# Patient Record
Sex: Male | Born: 1940 | Race: White | Hispanic: No | Marital: Married | Smoking: Former smoker
Health system: Southern US, Community
[De-identification: ages and names within clinical notes are randomized; demographics above are authoritative.]

## PROBLEM LIST (undated history)

## (undated) DIAGNOSIS — I2510 Atherosclerotic heart disease of native coronary artery without angina pectoris: Secondary | ICD-10-CM

## (undated) DIAGNOSIS — E782 Mixed hyperlipidemia: Secondary | ICD-10-CM

## (undated) DIAGNOSIS — I509 Heart failure, unspecified: Secondary | ICD-10-CM

## (undated) DIAGNOSIS — C61 Malignant neoplasm of prostate: Secondary | ICD-10-CM

## (undated) DIAGNOSIS — Z8673 Personal history of transient ischemic attack (TIA), and cerebral infarction without residual deficits: Secondary | ICD-10-CM

## (undated) DIAGNOSIS — I495 Sick sinus syndrome: Secondary | ICD-10-CM

## (undated) DIAGNOSIS — D649 Anemia, unspecified: Secondary | ICD-10-CM

## (undated) DIAGNOSIS — C7951 Secondary malignant neoplasm of bone: Secondary | ICD-10-CM

## (undated) DIAGNOSIS — T82110A Breakdown (mechanical) of cardiac electrode, initial encounter: Secondary | ICD-10-CM

## (undated) DIAGNOSIS — I480 Paroxysmal atrial fibrillation: Secondary | ICD-10-CM

## (undated) HISTORY — DX: Atherosclerotic heart disease of native coronary artery without angina pectoris: I25.10

## (undated) HISTORY — DX: Secondary malignant neoplasm of bone: C61

## (undated) HISTORY — DX: Breakdown (mechanical) of cardiac electrode, initial encounter: T82.110A

## (undated) HISTORY — DX: Mixed hyperlipidemia: E78.2

## (undated) HISTORY — DX: Sick sinus syndrome: I49.5

## (undated) HISTORY — PX: PACEMAKER INSERTION: SHX728

## (undated) HISTORY — DX: Paroxysmal atrial fibrillation: I48.0

## (undated) HISTORY — DX: Malignant neoplasm of prostate: C79.51

## (undated) HISTORY — DX: Personal history of transient ischemic attack (TIA), and cerebral infarction without residual deficits: Z86.73

---

## 2001-12-07 ENCOUNTER — Ambulatory Visit: Admission: RE | Admit: 2001-12-07 | Discharge: 2002-03-07 | Payer: Self-pay | Admitting: Radiation Oncology

## 2004-12-09 ENCOUNTER — Ambulatory Visit: Payer: Self-pay | Admitting: Cardiology

## 2004-12-16 ENCOUNTER — Inpatient Hospital Stay (HOSPITAL_BASED_OUTPATIENT_CLINIC_OR_DEPARTMENT_OTHER): Admission: RE | Admit: 2004-12-16 | Discharge: 2004-12-16 | Payer: Self-pay | Admitting: Cardiology

## 2004-12-18 ENCOUNTER — Ambulatory Visit (HOSPITAL_COMMUNITY): Admission: RE | Admit: 2004-12-18 | Discharge: 2004-12-19 | Payer: Self-pay | Admitting: Cardiology

## 2004-12-19 ENCOUNTER — Ambulatory Visit: Payer: Self-pay | Admitting: Cardiology

## 2005-01-05 ENCOUNTER — Ambulatory Visit: Payer: Self-pay | Admitting: Cardiology

## 2005-02-22 ENCOUNTER — Ambulatory Visit: Payer: Self-pay | Admitting: Cardiology

## 2005-03-03 ENCOUNTER — Ambulatory Visit: Payer: Self-pay | Admitting: Cardiology

## 2005-05-12 ENCOUNTER — Ambulatory Visit: Payer: Self-pay | Admitting: Internal Medicine

## 2005-06-11 ENCOUNTER — Ambulatory Visit: Payer: Self-pay | Admitting: Internal Medicine

## 2005-07-15 ENCOUNTER — Ambulatory Visit: Payer: Self-pay | Admitting: Internal Medicine

## 2005-08-13 ENCOUNTER — Ambulatory Visit: Payer: Self-pay | Admitting: Internal Medicine

## 2005-09-15 ENCOUNTER — Ambulatory Visit: Payer: Self-pay | Admitting: Internal Medicine

## 2005-09-29 ENCOUNTER — Ambulatory Visit: Payer: Self-pay | Admitting: Cardiology

## 2005-10-06 ENCOUNTER — Ambulatory Visit: Payer: Self-pay | Admitting: Cardiology

## 2005-10-07 ENCOUNTER — Ambulatory Visit: Payer: Self-pay | Admitting: Cardiology

## 2005-11-01 ENCOUNTER — Ambulatory Visit: Payer: Self-pay | Admitting: Internal Medicine

## 2005-12-03 ENCOUNTER — Ambulatory Visit: Payer: Self-pay | Admitting: Cardiology

## 2005-12-06 ENCOUNTER — Ambulatory Visit: Payer: Self-pay | Admitting: Internal Medicine

## 2006-01-03 ENCOUNTER — Ambulatory Visit: Payer: Self-pay | Admitting: Internal Medicine

## 2006-02-07 ENCOUNTER — Ambulatory Visit: Payer: Self-pay | Admitting: Internal Medicine

## 2006-02-08 ENCOUNTER — Encounter: Payer: Self-pay | Admitting: Cardiology

## 2006-03-09 ENCOUNTER — Ambulatory Visit: Payer: Self-pay | Admitting: Internal Medicine

## 2006-04-28 ENCOUNTER — Ambulatory Visit: Payer: Self-pay | Admitting: Cardiology

## 2006-05-25 ENCOUNTER — Ambulatory Visit: Payer: Self-pay | Admitting: Internal Medicine

## 2006-06-01 ENCOUNTER — Ambulatory Visit: Payer: Self-pay | Admitting: Cardiology

## 2006-07-20 ENCOUNTER — Ambulatory Visit: Payer: Self-pay | Admitting: Internal Medicine

## 2006-08-17 ENCOUNTER — Ambulatory Visit: Payer: Self-pay | Admitting: Internal Medicine

## 2006-09-14 ENCOUNTER — Ambulatory Visit: Payer: Self-pay | Admitting: Internal Medicine

## 2006-09-30 ENCOUNTER — Ambulatory Visit: Payer: Self-pay | Admitting: Cardiology

## 2006-09-30 ENCOUNTER — Ambulatory Visit: Payer: Self-pay | Admitting: Internal Medicine

## 2006-10-12 ENCOUNTER — Ambulatory Visit: Payer: Self-pay | Admitting: Internal Medicine

## 2006-11-09 ENCOUNTER — Ambulatory Visit: Payer: Self-pay | Admitting: Internal Medicine

## 2006-12-07 ENCOUNTER — Ambulatory Visit: Payer: Self-pay | Admitting: Internal Medicine

## 2007-01-02 ENCOUNTER — Ambulatory Visit: Payer: Self-pay | Admitting: Cardiology

## 2007-01-04 ENCOUNTER — Ambulatory Visit: Payer: Self-pay | Admitting: Internal Medicine

## 2007-02-01 ENCOUNTER — Ambulatory Visit: Payer: Self-pay | Admitting: Internal Medicine

## 2007-02-28 ENCOUNTER — Ambulatory Visit: Payer: Self-pay | Admitting: Internal Medicine

## 2007-03-29 ENCOUNTER — Ambulatory Visit: Payer: Self-pay | Admitting: Internal Medicine

## 2007-05-05 ENCOUNTER — Encounter: Payer: Self-pay | Admitting: Physician Assistant

## 2007-05-05 ENCOUNTER — Ambulatory Visit: Payer: Self-pay | Admitting: Internal Medicine

## 2007-05-10 ENCOUNTER — Ambulatory Visit (HOSPITAL_COMMUNITY): Admission: RE | Admit: 2007-05-10 | Discharge: 2007-05-10 | Payer: Self-pay | Admitting: Internal Medicine

## 2007-05-10 ENCOUNTER — Ambulatory Visit: Payer: Self-pay | Admitting: Internal Medicine

## 2007-05-25 ENCOUNTER — Ambulatory Visit: Payer: Self-pay

## 2007-09-01 ENCOUNTER — Ambulatory Visit: Payer: Self-pay | Admitting: Internal Medicine

## 2008-02-12 ENCOUNTER — Encounter: Payer: Self-pay | Admitting: Cardiology

## 2008-02-15 ENCOUNTER — Ambulatory Visit: Payer: Self-pay | Admitting: Cardiology

## 2008-05-21 ENCOUNTER — Ambulatory Visit: Payer: Self-pay | Admitting: Internal Medicine

## 2008-09-04 ENCOUNTER — Ambulatory Visit: Payer: Self-pay | Admitting: Internal Medicine

## 2008-10-04 DIAGNOSIS — I495 Sick sinus syndrome: Secondary | ICD-10-CM | POA: Insufficient documentation

## 2008-10-04 DIAGNOSIS — E782 Mixed hyperlipidemia: Secondary | ICD-10-CM

## 2008-10-04 DIAGNOSIS — G459 Transient cerebral ischemic attack, unspecified: Secondary | ICD-10-CM | POA: Insufficient documentation

## 2008-10-04 DIAGNOSIS — I2510 Atherosclerotic heart disease of native coronary artery without angina pectoris: Secondary | ICD-10-CM | POA: Insufficient documentation

## 2008-12-04 ENCOUNTER — Ambulatory Visit: Payer: Self-pay | Admitting: Internal Medicine

## 2008-12-24 ENCOUNTER — Encounter: Payer: Self-pay | Admitting: Internal Medicine

## 2009-02-06 ENCOUNTER — Encounter (INDEPENDENT_AMBULATORY_CARE_PROVIDER_SITE_OTHER): Payer: Self-pay | Admitting: *Deleted

## 2009-03-13 ENCOUNTER — Encounter: Payer: Self-pay | Admitting: Internal Medicine

## 2009-03-17 ENCOUNTER — Ambulatory Visit: Payer: Self-pay | Admitting: Internal Medicine

## 2009-03-27 ENCOUNTER — Encounter: Payer: Self-pay | Admitting: Cardiology

## 2009-04-03 ENCOUNTER — Encounter: Payer: Self-pay | Admitting: Internal Medicine

## 2009-05-01 ENCOUNTER — Ambulatory Visit: Payer: Self-pay | Admitting: Cardiology

## 2009-05-01 DIAGNOSIS — Z950 Presence of cardiac pacemaker: Secondary | ICD-10-CM | POA: Insufficient documentation

## 2009-05-23 ENCOUNTER — Ambulatory Visit: Payer: Self-pay | Admitting: Internal Medicine

## 2009-08-26 ENCOUNTER — Ambulatory Visit: Payer: Self-pay | Admitting: Internal Medicine

## 2009-08-28 ENCOUNTER — Encounter: Payer: Self-pay | Admitting: Internal Medicine

## 2009-11-28 ENCOUNTER — Encounter: Payer: Self-pay | Admitting: Internal Medicine

## 2009-12-04 ENCOUNTER — Ambulatory Visit: Payer: Self-pay | Admitting: Internal Medicine

## 2009-12-04 ENCOUNTER — Other Ambulatory Visit: Payer: Self-pay | Admitting: Internal Medicine

## 2009-12-18 ENCOUNTER — Encounter: Payer: Self-pay | Admitting: Internal Medicine

## 2010-03-06 ENCOUNTER — Encounter (INDEPENDENT_AMBULATORY_CARE_PROVIDER_SITE_OTHER): Payer: Self-pay | Admitting: *Deleted

## 2010-04-14 ENCOUNTER — Encounter: Payer: Self-pay | Admitting: Physician Assistant

## 2010-04-14 ENCOUNTER — Encounter: Payer: Self-pay | Admitting: Cardiology

## 2010-04-15 ENCOUNTER — Ambulatory Visit: Payer: Self-pay | Admitting: Cardiovascular Disease

## 2010-04-15 ENCOUNTER — Inpatient Hospital Stay (HOSPITAL_COMMUNITY): Admission: AD | Admit: 2010-04-15 | Discharge: 2010-04-16 | Payer: Self-pay | Admitting: Cardiology

## 2010-04-15 ENCOUNTER — Ambulatory Visit: Payer: Self-pay | Admitting: Cardiology

## 2010-04-16 ENCOUNTER — Encounter: Payer: Self-pay | Admitting: Cardiology

## 2010-05-06 ENCOUNTER — Ambulatory Visit: Payer: Self-pay | Admitting: Cardiology

## 2010-05-06 DIAGNOSIS — I2510 Atherosclerotic heart disease of native coronary artery without angina pectoris: Secondary | ICD-10-CM

## 2010-05-06 DIAGNOSIS — I951 Orthostatic hypotension: Secondary | ICD-10-CM

## 2010-06-22 ENCOUNTER — Encounter: Payer: Self-pay | Admitting: Internal Medicine

## 2010-06-22 ENCOUNTER — Ambulatory Visit
Admission: RE | Admit: 2010-06-22 | Discharge: 2010-06-22 | Payer: Self-pay | Source: Home / Self Care | Attending: Internal Medicine | Admitting: Internal Medicine

## 2010-06-25 ENCOUNTER — Encounter: Payer: Self-pay | Admitting: Cardiology

## 2010-06-29 ENCOUNTER — Ambulatory Visit
Admission: RE | Admit: 2010-06-29 | Discharge: 2010-06-29 | Payer: Self-pay | Source: Home / Self Care | Attending: Cardiology | Admitting: Cardiology

## 2010-06-29 ENCOUNTER — Encounter: Payer: Self-pay | Admitting: Internal Medicine

## 2010-07-01 ENCOUNTER — Encounter: Payer: Self-pay | Admitting: Cardiology

## 2010-07-14 ENCOUNTER — Telehealth (INDEPENDENT_AMBULATORY_CARE_PROVIDER_SITE_OTHER): Payer: Self-pay | Admitting: *Deleted

## 2010-07-14 NOTE — Assessment & Plan Note (Signed)
Summary: 1 YR FU PER NOV REMINDER   Visit Type:  Follow-up Primary Provider:  Dr. Donzetta Sprung   History of Present Illness: the patient is a  70 year-old male who underwent a recent cardiac catheterization. He received a drug-eluting stent in the midright coronary artery. The patient continues to have single-vessel coronary artery disease and preserved LV function. His procedure was performed on April 15, 2010  the patient also has a history of type pacemaker implantation, obstructivesleep apnea prostate cancer status post radiation came therapy and history of TIAs. Prior cardiac history includes Taxus stent x3 to the right or guarding 2006. Pacemaker was for sick sinus syndrome  the patient denies any recurrent chest pain. He has no shortness of breath. He has been doing well. His blood pressure however is relatively low. He noticed that this occurred after he starts on Flomax. He does have dizziness standing up.  Preventive Screening-Counseling & Management  Alcohol-Tobacco     Smoking Status: quit  Comments: quit smoking age 5  Current Medications (verified): 1)  Plavix 75 Mg Tabs (Clopidogrel Bisulfate) .... Take One Tablet By Mouth Daily 2)  Simvastatin 40 Mg Tabs (Simvastatin) .... Take One Tablet By Mouth Daily At Bedtime 3)  Zoloft 100 Mg Tabs (Sertraline Hcl) .... Take 1 Tablet By Mouth Once A Day 4)  Aspirin Ec 325 Mg Tbec (Aspirin) .... Take One Tablet By Mouth Daily 5)  Vitamin B-12 1000 Mcg Tabs (Cyanocobalamin) .... Take 1 Tablet By Mouth Once A Day 6)  Atenolol 50 Mg Tabs (Atenolol) .... Take 1/2 Tablet By Mouth Daily 7)  Klonopin 1 Mg Tabs (Clonazepam) .... Take 1 1/2 Tablet By Mouth Once A Day At [facility] 8)  Protonix 40 Mg Tbec (Pantoprazole Sodium) .... Take 1 Capsule By Mouth Once A Day 9)  Nitrostat 0.4 Mg Subl (Nitroglycerin) .Marland Kitchen.. 1 Tablet Under Tongue At Onset of Chest Pain; You May Repeat Every 5 Minutes For Up To 3 Doses.  Allergies: 1)  ! Pyridium 2)  !  Pcn  Comments:  Nurse/Medical Assistant: [name] medications were reviewed with the patient and were updated in the Medication List. Pt brought a list of medications to office visit.  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 Sinus node dysfunction status post pacemaker implantation and     generator replacement. 2. History of severe single-vessel coronary artery disease status post     Taxus at right coronary artery in 2006 and preserved left     ventricular function. 3. Transient ischemic attack recently. 4. Dyslipidemia.  drug-eluting stent to the right coronary artery April 15, 2010] preserved LV function  Review of Systems       The patient complains of dizziness.  The patient denies fatigue, malaise, fever, weight gain/loss, vision loss, decreased hearing, hoarseness, chest pain, palpitations, shortness of breath, prolonged cough, wheezing, sleep apnea, coughing up blood, abdominal pain, blood in stool, nausea, vomiting, diarrhea, heartburn, incontinence, blood in urine, muscle weakness, joint pain, leg swelling, rash, skin lesions, headache, fainting, depression, anxiety, enlarged lymph nodes, easy bruising or bleeding, and environmental allergies.    Vital Signs:  Patient profile:   70 year old male Height:      70 inches Weight:      146.50 pounds BMI:     21.10 Pulse rate:   68 / minute Pulse (ortho):  63 / minute BP sitting:   83 / 62  (left arm) BP standing:   88 / 53 Cuff size:   regular  Vitals Entered By: Cyril Loosen, RN, BSN (May 06, 2010 12:57 PM)  Serial Vital Signs/Assessments:  Time      Position  BP       Pulse  Resp  Temp     By 1:49 PM   Lying RA  98/64    [address], LPN 8:41 PM   Sitting   78/42    [address], LPN 3:24 PM   Standing  88/53    63                    Romney, LPN  Comments: 4:01 PM after 3 min:  81/60  61 after 5 min:  92/58  61 By: Hoover Brunette, LPN   Comments Per pt had MI and stent placed since last office visit.    Physical Exam  Additional Exam:  General: Well-developed, well-nourished in no distress head: Normocephalic and atraumatic eyes PERRLA/EOMI intact, conjunctiva and lids normal nose: No deformity or lesions mouth normal dentition, normal posterior pharynx neck: Supple, no JVD.  No masses, thyromegaly or abnormal cervical nodes lungs: Normal breath sounds bilaterally without wheezing.  Normal percussion heart: regular rate and rhythm with normal S1 and S2, no S3 or S4.  PMI is normal.  No pathological murmurs abdomen: Normal bowel sounds, abdomen is soft and nontender without masses, organomegaly or hernias noted.  No hepatosplenomegaly musculoskeletal: Back normal, normal gait muscle strength and tone normal pulsus: Pulse is normal in all 4 extremities Extremities: No peripheral pitting edema neurologic: Alert and oriented x 3 skin: Intact without lesions or rashes cervical nodes: No significant adenopathy psychologic: Normal affect    PPM Specifications Following MD:  Hillis Range, MD     PPM Vendor:  Medtronic     PPM Model Number:  ADDRL1     PPM Serial Number:  [id number] PPM DOI:  05/10/2007     PPM Implanting MD:  Sherryl Manges, MD  Lead 1    Location: RA     DOI: 09/19/1997     Model #: [id number]     Serial #: [id number]     Status: active Lead 2    Location: RV     DOI: 09/19/1997     Model #: [id number]     Serial #: [id number]     Status: active   Indications:  SINUS NODE DYSFUNCTION   PPM Follow Up Pacer Dependent:  No      Parameters Mode:  MVP (R)     Lower Rate Limit:  60     Upper Rate Limit:  150 Paced AV Delay:  150     Sensed AV Delay:  120  Impression & Recommendations:  Problem # 1:  CORONARY ARTERY DISEASE, S/P PTCA  (ICD-414.9) the patient underwent stent placement of the right coronary artery. He had no complications. He remains on lifelong aspirin and Plavix therapy. He has not total of 4 stents in his right coronary artery. His updated medication list for this problem includes:    Plavix 75 Mg Tabs (Clopidogrel bisulfate) .Marland Kitchen... Take one tablet by mouth daily  Aspirin Ec 325 Mg Tbec (Aspirin) .Marland Kitchen... Take one tablet by mouth daily    Atenolol 50 Mg Tabs (Atenolol) .Marland Kitchen... Take 1/2 tablet by mouth daily    Nitrostat 0.4 Mg Subl (Nitroglycerin) .Marland Kitchen... 1 tablet under tongue at onset of chest pain; you may repeat every 5 minutes for up to 3 doses.  Problem # 2:  ORTHOSTATIC HYPOTENSION (ICD-458.0) the patient's blood pressure is low in the office today. He also has orthostatic symptoms. We will check his blood pressure lying and standing. I asked him to stop Flomax. He remains orthostatic he could benefit from a small dose of Midodrin  Problem # 3:  CARDIAC PACEMAKER IN SITU (ICD-V45.01) no complications related to pacemaker.  Patient Instructions: 1)  Stop Flomax 2)  Follow up in  6 months

## 2010-07-14 NOTE — Letter (Signed)
Summary: Remote Device Check  [employer], Main Office  [address]   [place], [place] [address]   Phone: [phone]  Fax: [phone]     December 18, 2009 MRN: [id number]   Russell Browning [address] [place], [place]  [address]   Dear [name],   Your remote transmission was recieved and reviewed by your physician.  All diagnostics were within normal limits for you.  __X___Your next transmission is scheduled for:  03-05-2010.  Please transmit at any time this day.  If you have a wireless device your transmission will be sent automatically.    Sincerely,  Vella Kohler

## 2010-07-14 NOTE — Cardiovascular Report (Signed)
Summary: Office Visit Remote   Office Visit Remote   Imported By: Roderic Ovens 12/23/2009 13:28:51  _____________________________________________________________________  External Attachment:    Type:   Image     Comment:   External Document

## 2010-07-14 NOTE — Cardiovascular Report (Signed)
Summary: Office Visit Remote  Office Visit Remote   Imported By: Roderic Ovens 09/10/2009 15:52:47  _____________________________________________________________________  External Attachment:    Type:   Image     Comment:   External Document

## 2010-07-14 NOTE — Letter (Signed)
Summary: Device-Delinquent Phone [employer], newspaper, Main Office  [address]   [place], [place] [address]   Phone: [phone]  Fax: [phone]     March 06, 2010 MRN: [id number]   TREMOND SHIMABUKURO [address] [place], [place]  [address]   Dear Mr. Herd,  According to our records, you were scheduled for a device phone transmission on                              .    03/05/10. We did not receive any results from this check.  If you transmitted on your scheduled day, please call us to help troubleshoot your system.  If you forgot to send your transmission, please send one upon receipt of this letter.  Thank you,  Altha Harm, LPN  March 06, 2010 3:24 PM  [facility]

## 2010-07-14 NOTE — Letter (Signed)
Summary: Device-Delinquent Phone Journalist, newspaper, Main Office  [address]   [place], [place] [address]   Phone: [phone]  Fax: [phone]     November 28, 2009 MRN: [id number]   Russell Browning [address] [place], [place]  [address]   Dear Mr. Klingbeil,  According to our records, you were scheduled for a device phone transmission on 11-26-2009 .     We did not receive any results from this check.  If you transmitted on your scheduled day, please call us to help troubleshoot your system.  If you forgot to send your transmission, please send one upon receipt of this letter.  Thank you,   Architectural technologist [facility]

## 2010-07-14 NOTE — Consult Note (Signed)
Summary: CARDIOLOGY CONSULT/ [facility]  CARDIOLOGY CONSULT/ [facility]   Imported By: Zachary George 05/06/2010 13:30:43  _____________________________________________________________________  External Attachment:    Type:   Image     Comment:   External Document

## 2010-07-14 NOTE — Letter (Signed)
Summary: Remote Device Check  [employer], Main Office  [address]   [place], [place] [address]   Phone: [phone]  Fax: [phone]     August 28, 2009 MRN: [id number]   Russell Browning [address] [place], [place]  [address]   Dear [name],   Your remote transmission was recieved and reviewed by your physician.  All diagnostics were within normal limits for you.  __X___Your next transmission is scheduled for:    November 26, 2009.  Please transmit at any time this day.  If you have a wireless device your transmission will be sent automatically.     Sincerely,  Proofreader

## 2010-07-14 NOTE — Consult Note (Signed)
Summary: CARDIOLOGY CONSULT/ [facility]  CARDIOLOGY CONSULT/ [facility]   Imported By: Zachary George 05/06/2010 13:35:44  _____________________________________________________________________  External Attachment:    Type:   Image     Comment:   External Document

## 2010-07-16 NOTE — Assessment & Plan Note (Signed)
Summary: 1 YR FU PER DECEMBER REMINDER   Visit Type:  Pacemaker check Primary Provider:  Dr. Donzetta Sprung   History of Present Illness: The patient presents today for routine electrophysiology followup. He reports doing very well since last being seen in our clinic. The patient denies symptoms of palpitations, chest pain, shortness of breath, orthopnea, PND, lower extremity edema, dizziness, presyncope, syncope, or neurologic sequela. The patient is tolerating medications without difficulties and is otherwise without complaint today.   Preventive Screening-Counseling & Management  Alcohol-Tobacco     Smoking Status: quit     Year Quit: 1965  Current Medications (verified): 1)  Plavix 75 Mg Tabs (Clopidogrel Bisulfate) .... Take One Tablet By Mouth Daily 2)  Simvastatin 40 Mg Tabs (Simvastatin) .... Take One Tablet By Mouth Daily At Bedtime 3)  Zoloft 100 Mg Tabs (Sertraline Hcl) .... Take 1 Tablet By Mouth Once A Day 4)  Aspirin Ec 325 Mg Tbec (Aspirin) .... Take One Tablet By Mouth Daily 5)  Vitamin B-12 1000 Mcg Tabs (Cyanocobalamin) .... Take 1 Tablet By Mouth Once A Day 6)  Atenolol 50 Mg Tabs (Atenolol) .... Take 1/2 Tablet By Mouth Daily 7)  Klonopin 1 Mg Tabs (Clonazepam) .... Take 1 1/2 Tablet By Mouth Once A Day At [facility] 8)  Protonix 40 Mg Tbec (Pantoprazole Sodium) .... Take 1 Capsule By Mouth Once A Day 9)  Nitrostat 0.4 Mg Subl (Nitroglycerin) .Marland Kitchen.. 1 Tablet Under Tongue At Onset of Chest Pain; You May Repeat Every 5 Minutes For Up To 3 Doses. 10)  Pro-Flora Concentrate  Caps (Probiotic Product) .... Take 1 Tablet By Mouth Once A Day  Allergies (verified): 1)  ! Pyridium 2)  ! Pcn  Comments:  Nurse/Medical Assistant: [name] medications and allergies were reviewed with the patient and were updated in the Medication and Allergy Lists. Reviewed list w/ pt. Tammi Romine CMA (June 22, 2010 2:58 PM)  Past History:  Past Medical History: Reviewed history from  05/06/2010 and no changes required. 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 Sinus node dysfunction status post pacemaker implantation and     generator replacement. 2. History of severe single-vessel coronary artery disease status post     Taxus at right coronary artery in 2006 and preserved left     ventricular function. 3. Transient ischemic attack recently. 4. Dyslipidemia.  drug-eluting stent to the right coronary artery April 15, 2010] preserved LV function  Past Surgical History: Reviewed history from 10/04/2008 and no changes required. Pacmaker impant/Guidant Discovery dual-chamber pacemaker model 1274/April of 1998 at [facility] Taxus stenting right coronary artery, (x3), July 7,2006/Thomas D. Riley Kill, M.D  Insertion of a new pacemaker/05/10/2007/Steven C. Klein, MD/Medtronic ADAPTA L dual-chamber device  Social History: Reviewed history from 05/01/2009 and no changes required. the patient quit tobacco in 1967.  Vital Signs:  Patient profile:   70 year old male Height:      70 inches Weight:      144 pounds Pulse rate:   66 / minute BP sitting:   100 / 64  (right arm) Cuff size:   regular  Vitals Entered By: Fuller Plan CMA (June 22, 2010 2:58 PM)  Physical Exam  General:  Well developed, well nourished, in no acute distress. Head:  normocephalic and atraumatic Eyes:  PERRLA/EOM intact; conjunctiva and lids normal. Mouth:  Teeth, gums and palate normal. Oral mucosa normal. Neck:  supple Chest Wall:  pacemaker site is well  healed Lungs:  Clear bilaterally to auscultation and percussion. Heart:  Non-displaced PMI, chest non-tender; regular rate and rhythm, S1, S2 without murmurs, rubs or gallops. Carotid upstroke normal, no bruit. Normal abdominal aortic size, no bruits. Femorals normal pulses, no bruits. Pedals normal pulses. No edema, no varicosities. Abdomen:   Bowel sounds positive; abdomen soft and non-tender without masses, organomegaly, or hernias noted. No hepatosplenomegaly. Msk:  Back normal, normal gait. Muscle strength and tone normal. Extremities:  No clubbing or cyanosis. Neurologic:  Alert and oriented x 3.   PPM Specifications Following MD:  Hillis Range, MD     PPM Vendor:  [employer]     PPM Model Number:  ADDRL1     PPM Serial Number:  [id number] PPM DOI:  05/10/2007     PPM Implanting MD:  Sherryl Manges, MD  Lead 1    Location: RA     DOI: 09/19/1997     Model #: 4524     Serial #: [id number]     Status: active Lead 2    Location: RV     DOI: 09/19/1997     Model #: 5784     Serial #: [id number]     Status: active   Indications:  SINUS NODE DYSFUNCTION   PPM Follow Up Battery Voltage:  2.79 V     Battery Est. Longevity:  10 YRS     Pacer Dependent:  No       PPM Device Measurements Atrium  Amplitude: 2.80 mV, Impedance: 322 ohms, Threshold: 0.50 V at 0.40 msec Right Ventricle  Amplitude: 22.40 mV, Impedance: 1222 ohms, Threshold: 0.750 V at 0.40 msec  Episodes MS Episodes:  0     Ventricular High Rate:  0     Atrial Pacing:  98.3%     Ventricular Pacing:  0.2%  Parameters Mode:  MVP (R)     Lower Rate Limit:  60     Upper Rate Limit:  150 Paced AV Delay:  150     Sensed AV Delay:  120 Next Remote Date:  09/24/2010     Next Cardiology Appt Due:  06/15/2011 Tech Comments:  NORMAL DEVICE FUNCTION.  NO EPISODES OR MODE SWITCHES SINCE LAST CHECK.  CHANGED ACTIVITY THRESHOLD FROM MEDIUM/LOW TO LOW.  CHANGED RA OUTPUT FROM 1.5 TO 2.0 AND RV OUTPUT FROM 2.0 TO 2.5 V. PT COMPLAINING SOB W/ACTIVITY.  PT HAD STENT PLACED IN NOVEMBER.  CARELINK 09-24-10 AND ROV IN 12 MTHS W/JA. 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

## 2010-07-16 NOTE — Cardiovascular Report (Signed)
Summary: Card Device Clinic/ FINAL REPORT  Card Device Clinic/ FINAL REPORT   Imported By: Dorise Hiss 06/24/2010 16:27:49  _____________________________________________________________________  External Attachment:    Type:   Image     Comment:   External Document

## 2010-07-16 NOTE — Letter (Signed)
Summary: External Correspondence/ OFFICE NOTE [facility]  External Correspondence/ OFFICE NOTE [facility]   Imported By: Dorise Hiss 07/07/2010 12:40:28  _____________________________________________________________________  External Attachment:    Type:   Image     Comment:   External Document

## 2010-07-22 ENCOUNTER — Ambulatory Visit (INDEPENDENT_AMBULATORY_CARE_PROVIDER_SITE_OTHER): Payer: Medicare Other | Admitting: Internal Medicine

## 2010-07-22 ENCOUNTER — Encounter: Payer: Self-pay | Admitting: Internal Medicine

## 2010-07-22 DIAGNOSIS — I495 Sick sinus syndrome: Secondary | ICD-10-CM

## 2010-07-22 NOTE — Procedures (Signed)
Summary: kristen to check device  Nurse Visit   Allergies: 1)  ! Pyridium 2)  ! Pcn   PPM Specifications Following MD:  Hillis Range, MD     PPM Vendor:  [employer]     PPM Model Number:  ADDRL1     PPM Serial Number:  [id number] PPM DOI:  05/10/2007     PPM Implanting MD:  Sherryl Manges, MD  Lead 1    Location: RA     DOI: 09/19/1997     Model #: 4524     Serial #: [id number]     Status: active Lead 2    Location: RV     DOI: 09/19/1997     Model #: 7829     Serial #: [id number]     Status: active   Indications:  SINUS NODE DYSFUNCTION   PPM Follow Up Battery Voltage:  2.79 V     Battery Est. Longevity:  9.5 yrs     Pacer Dependent:  No       PPM Device Measurements Atrium  Amplitude: 2.00 mV, Impedance: 360 ohms, Threshold: 0.50 V at 0.40 msec Right Ventricle  Amplitude: 15.68 mV, Impedance: 1261 ohms, Threshold: 0.750 V at 0.40 msec  Episodes MS Episodes:  443     Percent Mode Switch:  0.5%     Ventricular High Rate:  0     Atrial Pacing:  98.9%     Ventricular Pacing:  5.8%  Parameters Mode:  MVP     Lower Rate Limit:  60     Upper Rate Limit:  150 Paced AV Delay:  150     Sensed AV Delay:  120 Tech Comments:  PT CALLED IN---TROUBLE W/SHOUDLER AND ARM JERKING.  PT RA LEAD POLARITY SWITCHED. CHANGED RA LEAD BACK TO BIPOLAR.  TURNED OFF CAPTURE MANAGEMENT SO PROBLEM WOULD NOT HAPPEN AGAIN.  FOLLOWUP AS PLANNED BY PREV OV. Vella Kohler  July 08, 2010 3:06 PM MD Comments:  There appears to be significant noise observed on the RA lead.  We will need to follow this closely.

## 2010-07-22 NOTE — Progress Notes (Signed)
  Phone Note Outgoing Call   Summary of Call: spoke w/pt to schedule ov w/ja for 07-22-10 @ 1345 in [place] office.  pt aware of ov.; Vella Kohler  July 14, 2010 5:37 PM

## 2010-07-22 NOTE — Cardiovascular Report (Signed)
Summary: Office Visit   Office Visit   Imported By: Roderic Ovens 07/16/2010 10:26:30  _____________________________________________________________________  External Attachment:    Type:   Image     Comment:   External Document

## 2010-07-30 NOTE — Assessment & Plan Note (Signed)
Summary: Per Belenda Cruise   Visit Type:  Pacemaker check Primary Provider:  Dr. Donzetta Sprung   History of Present Illness: The patient presents today for electrophysiology followup after recently presenting to the office with atrial lead impedance changes and pectoralis stimulation. He reports doing very well since that time. The patient denies symptoms of palpitations, chest pain, shortness of breath, orthopnea, PND, lower extremity edema, dizziness, presyncope, syncope, or neurologic sequela.  He reports having neck/ back tightness while walking several days ago.  He does not feel that this is his anginal equivalent and has not had any further episodes.  The patient is tolerating medications without difficulties and is otherwise without complaint today.   Preventive Screening-Counseling & Management  Alcohol-Tobacco     Smoking Status: quit     Year Quit: 1965  Current Medications (verified): 1)  Plavix 75 Mg Tabs (Clopidogrel Bisulfate) .... Take One Tablet By Mouth Daily 2)  Simvastatin 40 Mg Tabs (Simvastatin) .... Take One Tablet By Mouth Daily At Bedtime 3)  Zoloft 100 Mg Tabs (Sertraline Hcl) .... Take 1 Tablet By Mouth Once A Day 4)  Aspirin Ec 325 Mg Tbec (Aspirin) .... Take One Tablet By Mouth Daily 5)  Vitamin B-12 1000 Mcg Tabs (Cyanocobalamin) .... Take 1 Tablet By Mouth Once A Day 6)  Atenolol 50 Mg Tabs (Atenolol) .... Take 1/4 Tablet Every Day. 7)  Klonopin 1 Mg Tabs (Clonazepam) .... Take 1 1/2 Tablet By Mouth Once A Day At [facility] 8)  Protonix 40 Mg Tbec (Pantoprazole Sodium) .... Take 1 Capsule By Mouth Once A Day 9)  Nitrostat 0.4 Mg Subl (Nitroglycerin) .Marland Kitchen.. 1 Tablet Under Tongue At Onset of Chest Pain; You May Repeat Every 5 Minutes For Up To 3 Doses. 10)  Pro-Flora Concentrate  Caps (Probiotic Product) .... Take 1 Tablet By Mouth Once A Day  Allergies (verified): 1)  ! Pyridium 2)  ! Pcn  Comments:  Nurse/Medical Assistant: [name] medications and allergies were  reviewed with the patient and were updated in the Medication and Allergy Lists. Tammi Romine CMA (July 22, 2010 1:40 PM)  Past History:  Past Medical History: Reviewed history from 05/06/2010 and no changes required. 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 Sinus node dysfunction status post pacemaker implantation and     generator replacement. 2. History of severe single-vessel coronary artery disease status post     Taxus at right coronary artery in 2006 and preserved left     ventricular function. 3. Transient ischemic attack recently. 4. Dyslipidemia.  drug-eluting stent to the right coronary artery April 15, 2010] preserved LV function  Past Surgical History: Reviewed history from 10/04/2008 and no changes required. Pacmaker impant/Guidant Discovery dual-chamber pacemaker model 1274/April of 1998 at [facility] Taxus stenting right coronary artery, (x3), July 7,2006/Thomas D. Riley Kill, M.D  Insertion of a new pacemaker/05/10/2007/Steven C. Klein, MD/Medtronic ADAPTA L dual-chamber device  Vital Signs:  Patient profile:   70 year old male Height:      70 inches Weight:      146 pounds BMI:     21.02 Pulse rate:   76 / minute BP sitting:   105 / 70  (right arm) Cuff size:   regular  Vitals Entered By: Fuller Plan CMA (July 22, 2010 1:40 PM)  Physical Exam  General:  Well developed, well nourished, in no acute distress. Head:  normocephalic and atraumatic Eyes:  PERRLA/EOM intact; conjunctiva and lids normal.  Mouth:  Teeth, gums and palate normal. Oral mucosa normal. Neck:  supple Chest Wall:  pacemaker site is well healed Lungs:  Clear bilaterally to auscultation and percussion. Heart:  Non-displaced PMI, chest non-tender; regular rate and rhythm, S1, S2 without murmurs, rubs or gallops. Carotid upstroke normal, no bruit. Normal abdominal aortic size, no  bruits. Femorals normal pulses, no bruits. Pedals normal pulses. No edema, no varicosities. Abdomen:  Bowel sounds positive; abdomen soft and non-tender without masses, organomegaly, or hernias noted. No hepatosplenomegaly. Msk:  Back normal, normal gait. Muscle strength and tone normal. Extremities:  No clubbing or cyanosis. Neurologic:  Alert and oriented x 3.   PPM Specifications Following MD:  Hillis Range, MD     PPM Vendor:  Medtronic     PPM Model Number:  [id number]     PPM Serial Number:  [id number] PPM DOI:  05/10/2007     PPM Implanting MD:  Sherryl Manges, MD  Lead 1    Location: RA     DOI: 09/19/1997     Model #: [id number]     Serial #: [id number]     Status: active Lead 2    Location: RV     DOI: 09/19/1997     Model #: 7829     Serial #: [id number]     Status: active   Indications:  SINUS NODE DYSFUNCTION   PPM Follow Up Battery Voltage:  2.79 V     Battery Est. Longevity:  9 yrs     Pacer Dependent:  No       PPM Device Measurements Atrium  Amplitude: 2.80 mV, Impedance: 277 ohms, Threshold: 0.50 V at 0.40 msec Right Ventricle  Amplitude: 15.68 mV, Impedance: 1161 ohms, Threshold: 1.00 V at 0.40 msec  Episodes MS Episodes:  0     Ventricular High Rate:  0     Atrial Pacing:  98.8%     Ventricular Pacing:  0.2%  Parameters Mode:  MVP     Lower Rate Limit:  60     Upper Rate Limit:  150 Paced AV Delay:  150     Sensed AV Delay:  120 Tech Comments:  RA LEAD IMPEDANCE 277. LAST CHECK RA LEAD IMPEDANCE 360.  NORMAL SENSING AND THRESHOLD TESTING.  NO CHANGES MADE. PLAN PER JA. 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  He is A paced 98%. We will continue to follow his atrial lead impedance.  He may require lead revision eventually, however at this time pacing/ sensing functions appear stable.  He would like to avoid lead  revision if possible.  Problem # 2:  CORONARY ARTERY DISEASE, S/P PTCA (ICD-414.9) stable he will contact our office if further ischemic symptoms occur

## 2010-08-11 NOTE — Cardiovascular Report (Signed)
Summary: Card Device Clinic/ FINAL REPORT  Card Device Clinic/ FINAL REPORT   Imported By: Dorise Hiss 08/04/2010 14:48:52  _____________________________________________________________________  External Attachment:    Type:   Image     Comment:   External Document

## 2010-08-25 LAB — LIPID PANEL
Cholesterol: 126 mg/dL (ref 0–200)
HDL: 31 mg/dL — ABNORMAL LOW (ref >39)
LDL Cholesterol: 67 mg/dL (ref 0–99)
Total CHOL/HDL Ratio: 4.1 RATIO

## 2010-08-25 LAB — BASIC METABOLIC PANEL
BUN: 13 mg/dL (ref 6–23)
Calcium: 8.9 mg/dL (ref 8.4–10.5)
GFR calc non Af Amer: >60 mL/min (ref >60)
Glucose, Bld: 96 mg/dL (ref 70–99)
Potassium: 4.2 mEq/L (ref 3.5–5.1)

## 2010-08-25 LAB — CBC
HCT: 38.7 % — ABNORMAL LOW (ref 39.0–52.0)
MCH: 29.7 pg (ref 26.0–34.0)
MCHC: 33.3 g/dL (ref 30.0–36.0)
RDW: 13.1 % (ref 11.5–15.5)

## 2010-10-23 ENCOUNTER — Encounter: Payer: Medicare Other | Admitting: *Deleted

## 2010-10-27 NOTE — Assessment & Plan Note (Signed)
[facility]                          [facility]   GODFREY, TRITSCHLER                        MRN:          [id number]  DATE:01/02/2007                            DOB:          [birth year]    REFERRING PHYSICIAN:  Donzetta Sprung   HISTORY OF PRESENT ILLNESS:  The patient is a 70 year old male with a  history of coronary artery disease, status post PCI to the right  coronary artery.  The patient also has sinus node dysfunction status  post Guidant pacemaker implantation at [facility] in 1999.  The patient has a history of tachy palpitations which have been fairly  quiescent.  The patient states that he has no chest pain or shortness of  breath.  He is doing quite well.  He does need refills on his  medications today.   MEDICATIONS:  1. Zoloft 100 mg p.o. daily.  2. Prevacid 30 mg p.o. daily.  3. Plavix 75 mg p.o. daily.  4. Zocor 40 mg p.o. daily.  5. Vitamin B12.  6. Aspirin 81 mg p.o. daily.  7. Atenolol 25 mg p.o. daily.  8. Fish oil daily.  9. Clonazepam.   PHYSICAL EXAMINATION:  VITAL SIGNS:  Blood pressure is 105/70, heart  rate 60 beats per minute, weight is 146 pounds.  NECK:  Normal carotid upstrokes.  No carotid bruits.  LUNGS:  Clear breath sounds bilaterally.  HEART:  Regular rate and rhythm.  Normal S1 S2.  No murmurs, rubs, or  gallops.  ABDOMEN:  Soft.  EXTREMITIES:  No cyanosis, clubbing, or edema.   PROBLEM LIST:  1. Tachycardic palpitations, improved.  2. Sinus node dysfunction.  3. Status post Guidant dual chamber pacemaker implanted in 1999.  4. History of severe single-vessel coronary artery disease, status      post TAXUS stent to the right coronary artery in 2006.  5. Preserved left ventricular function.  6. History of transient ischemic attack.  7. Dyslipidemia.   PLAN:  1. The patient can be continued on atenolol for his tachy      palpitations.  If not significantly improved, we can  increase      atenolol to 50 mg p.o. daily.  2. The patient is in a sequential paced rhythm and there is no      indication of atrial fibrillation.      He can continue on aspirin and Plavix.  3. The patient can follow up with [place] in 1 year.     Learta Codding, MD,FACC  Electronically Signed    GED/MedQ  DD: 01/02/2007  DT: 01/02/2007  Job #: [id number]   cc:   Donzetta Sprung

## 2010-10-27 NOTE — Discharge Summary (Signed)
NAME:  Russell Browning, Russell Browning Browning NO.:  [id number]   MEDICAL RECORD NO.:  [id number]          PATIENT TYPE:  OIB   LOCATION:  2854                         FACILITY:  [facility]   PHYSICIAN:  Duke Salvia, MD, FACCDATE OF BIRTH:  1941/05/10   DATE OF ADMISSION:  05/10/2007  DATE OF DISCHARGE:  05/10/2007                               DISCHARGE SUMMARY   ALLERGIES:  PENICILLIN and PERIDIUM are his allergies.   Dictation greater than 35 minutes.   FINAL DIAGNOSES:  1. Pacemaker at elective replacement indicator.  2. Procedure May 10, 2007 explantation of existing Guidant 1274      dual-chamber device with implantation of Medtronic ADAPTA L dual-      chamber device, Dr. Sherryl Manges.  3. Pacemaker for sinus node dysfunction.   SECONDARY DIAGNOSES:  1. Ischemic heart disease status post drug-eluting stents to the right      coronary artery x3.  2. Normal ejection fraction.  3. Sleep disturbance, question sleep apnea.   BRIEF HISTORY:  Russell Browning Russell Browning Browning is a 70 year old male.  He has been feeling  poorly for about a month.  His pacemaker has reached ERI.  It has  reverted to DDD.  It has lost rate response.  The pacemaker was  implanted for sinus node dysfunction and syncope.  He has had no  recurrent syncope.  He has had no chest pain.  He does have daytime  somnolence.  He had a negative sleep study.  Interrogation of the  Guidant 1274 pulse generator demonstrated DDD reversion and ERI.  The  patient will present electively for pacemaker change out.   HOSPITAL COURSE:  The patient presented electively November 26.  He had  explantation of existing pacemaker with implant of the new generator.  He had no postprocedural complications.  He is asked to remove the  bandage on the morning of Thursday, November 27, leave the incision open  to the air, to keep the incision dry for the next seven days, and sponge  bathe until Wednesday, December 3.   MEDICATIONS AT DISCHARGE:  1.  Prilosec 20 mg daily.  2. Enteric-coated aspirin 81 mg daily.  3. Plavix 75 mg daily.  4. Sertraline 100 mg daily.  5. Atenolol 50 mg daily.  6. Klonopin 1 mg at bedtime.  7. Vitamin B12 1000 mg daily.   FOLLOWUP:  Follow up at [facility] at [address]  [facility] on Thursday, December 11, at 9:40.      Russell Browning Russell Browning Browning, [place]      Duke Salvia, MD, [facility]  Electronically Signed    GM/MEDQ  D:  05/10/2007  T:  05/10/2007  Job:  [id number]   cc:   Duke Salvia, MD, [facility]

## 2010-10-27 NOTE — Cardiovascular Report (Signed)
[facility]                   [facility]   Russell Browning, Russell Browning                        MRN:          [id number]  DATE:09/01/2007                            DOB:          09/25/1940    HISTORY:  Russell Browning is seen following pacemaker generator replacement.  He had reached ERI and was feeling crummy.  He is now feeling terrific  again.  His conversation is replete with jokes about women and etc.   PHYSICAL EXAMINATION:  VITAL SIGNS:  On examination his blood pressure  is 109/74, with pulse of 62.  LUNGS:  His lungs were clear.  HEART:  Sounds were regular.  EXTREMITIES:  Were without edema.  His device pocket was well-healed.   Interrogation of the device demonstrated normal pacemaker function.   IMPRESSION:  1. Sinus node dysfunction.  2. Status post pacemaker recently replaced.   Russell Browning is fine.  We will see him again in 9 months' time at which  time we will begin remote followup.     Duke Salvia, MD, [facility]  Electronically Signed    SCK/MedQ  DD: 09/01/2007  DT: 09/01/2007  Job #: [id number]

## 2010-10-27 NOTE — Op Note (Signed)
NAME:  LEONIDAS, BOATENG NO.:  [id number]   MEDICAL RECORD NO.:  [id number]          PATIENT TYPE:  OIB   LOCATION:  2854                         FACILITY:  [facility]   PHYSICIAN:  Duke Salvia, MD, FACCDATE OF BIRTH:  April 27, 1941   DATE OF PROCEDURE:  05/10/2007  DATE OF DISCHARGE:                               OPERATIVE REPORT   PREOPERATIVE DIAGNOSES:  1. Sinus node dysfunction.  2. Previously-implanted pacemaker, now at end of life, with reversion      to VVI.   POSTOPERATIVE DIAGNOSES:  1. Sinus node dysfunction.  2. Previously-implanted pacemaker, now at end of life, with reversion      to VVI.   PROCEDURES:  1. Explantation of a previously-implanted pacemaker.  2. Disarticulation of the header of the pacemaker because of stuck      leads.  3. Insertion of a new pacemaker.  4. Lead repair.   Following the obtaining of informed consent, the patient was brought to  the electrophysiology laboratory and placed on the fluoroscopic table in  supine position.  After routine prep and drape of the left upper chest,  lidocaine was infiltrated along the line of the previous incision and  carried down to the layer of the device pocket.  Using sharp dissection  and electrocautery, the pocket was opened.  The pocket was then opened.  It turned out that the leads were unprotected at the cephalad aspect of  the pocket.  The device was exposed and explanted.  There was heme noted  in the atrial lead throughout its length.  The leads were unscrewed from  the pacemaker.  However, I was unable to disarticulate the leads from  the pacemaker header.   We then took a Horsley bone cutter and disarticulated the back portion  of the head.  We are then able push the lead pins out without damage to  the leads.  Interrogation of the previously-implanted atrial lead that  was of 4524, serial number [id number], and the amplitude was 3.8 with  impedance of 479, a threshold of 0.3 at  0.5, and a current of threshold  of 0.8 mA.  The bipolar ventricular lead was a Medtronic [id number], I think  it was Medtronic, serial number [id number], which makes me think that may  not have been Medtronic, may have been a Guidant lead, the RV amplitude  which was 14.5 with impedance of 1089, a threshold of 1 V at 0.5 and the  current was 1 mA.   The leads were then secured to a new Adapta-L ADDRL pulse generator,  serial number [id number].  Ventricular pacing and then sinus pacing were  identified.  The pocket was copiously irrigated with antibiotic-  containing saline solution.  Hemostasis was assured.  The lead and the  pulse generator then placed in the pocket and they were secured to the  prepectoral fascia as I changed their angle of the sitting of the device  in the pocket.  The wound was then closed in three layers in the normal  fashion.  The wound was washed, dried, and  a benzoin and  Steri-Strip dressing was applied.  Needle counts, sponge counts and  instrument counts were correct at the end of the procedure.   The patient tolerated the procedure without apparent complication.      Duke Salvia, MD, [facility]  Electronically Signed     SCK/MEDQ  D:  05/10/2007  T:  05/10/2007  Job:  [id number]   cc:   Willeen Niece, [facility]  Electrophysiology Laboratory

## 2010-10-27 NOTE — Cardiovascular Report (Signed)
[facility]                   [facility]   VISHWA, DAIS                        MRN:          [id number]  DATE:05/05/2007                            DOB:          12/02/1940    Russell Browning is seen.  He has been feeling crummy for about a month.  It  turns out that on interrogation of his pacemaker he reached ERI and  reverted to DDD and lost his rate response about a month ago.  His pacer  was implanted for sinus node dysfunction and syncope.  He has had no  recurrent syncope.   He also has coronary artery disease, is status post three DES stents to  his RCA.  He has normal LV function.   He has had no problems with chest pain.  He does have daytime  somnolence.  He has undergone a sleep study recently that was negative.  His medications include Zoloft and clonazepam, both of which may be  contributing.  He is also on omeprazole, atenolol, aspirin, Zocor and  Plavix.  His allergies include PENICILLIN and PYRIDIUM.   On examination, his blood pressure was 115/58, his pulse was 61.  His  lungs were clear, his heart sounds were regular, extremities were  without edema, skin was warm and dry, abdomen was soft with active bowel  sounds.   Interrogation of his Guidant 1274 pulse generator demonstrated atrial  impedance of 480, ventricular impedance of 1360, both of which are  relatively stable.  He has reverted to DDD as noted.   IMPRESSION:  1. Sinus node dysfunction.  2. Status post pacer for the above.  3. Ischemic heart disease.      a.     Status post right coronary artery drug-eluting stenting x3.      b.     Ejection fraction normal.  4. Sleep disturbance, question sleep apnea.   Mr. Larke has reached elective replacement for his pacemaker.  We have  discussed potential benefits as well as potential risks including but  not limited to infection and lead fracture.  He understands these risks  and would like  to proceed.   We will plan to do this next Wednesday.     Duke Salvia, MD, [facility]  Electronically Signed    SCK/MedQ  DD: 05/05/2007  DT: 05/06/2007  Job #: [id number]

## 2010-10-27 NOTE — Assessment & Plan Note (Signed)
[facility]                          [facility]   Russell Browning, Russell Browning                        MRN:          [id number]  DATE:02/15/2008                            DOB:          1941-02-23    HISTORY OF PRESENT ILLNESS:  The patient is a 70 year old male with a  history of coronary artery disease status post PCI to right coronary  artery.  The patient also has sinus node dysfunction status post Guidant  pacemaker implantation at [facility] in 1999 and with  change of generator by Dr. Graciela Husbands last year.  The patient is doing well.  He was unfortunately hospitalized just recently with a TIA.  The patient  had drooping of the left side of face, left arm, and left leg weakness.  A CT scan showed no acute abnormalities.  His symptoms rapidly resolved.  Interestingly, the patient had not taken his Plavix for a week prior to  his stroke.  He is now back on aspirin and Plavix.   MEDICATIONS:  1. Plavix 75 mg p.o. daily.  2. Zocor 40 mg p.o. daily.  3. Klonopin 1 mg p.o. nightly.  4. Zoloft 100 mg p.o. daily.  5. Prevacid 30 mg p.o. daily.  6. Aspirin 81 mg a day.  7. Vitamin B12.  8. Atenolol 50 mg p.o. daily.   PHYSICAL EXAMINATION:  VITAL SIGNS:  Blood pressure 108/72, heart rate  67, and weight 151 pounds.  NECK:  Normal carotid upstroke and no carotid bruits.  LUNGS:  Clear breath sounds bilaterally.  HEART:  Regular rate and rhythm.  Normal S1 and S2.  No murmur, rubs, or  gallop.  ABDOMEN:  Soft and nontender.  No rebound or guarding.  Good bowel  sounds.  EXTREMITIES:  No cyanosis, clubbing, or edema.  NEUROLOGIC:  The patient is alert, oriented, and grossly nonfocal.   PROBLEM LIST:  1. Sinus node dysfunction status post pacemaker implantation and      generator replacement.  2. History of severe single-vessel coronary artery disease status post      Taxus at right coronary artery in 2006 and preserved left  ventricular function.  3. Transient ischemic attack recently.  4. Dyslipidemia.   PLAN:  1. The patient will remain on aspirin and Plavix.  2. No indication for carotid Dopplers and I agree with Dr. Reuel Boom,      there are no carotid bruits.  3. The patient will have a follow up in 1 year.  There are no active      cardiac issues at this point      in time.  He will see Dr. Graciela Husbands in January at which time he will      start his remote monitoring of his pacemaker.     Learta Codding, MD,FACC  Electronically Signed    GED/MedQ  DD: 02/15/2008  DT: 02/15/2008  Job #: [id number]

## 2010-10-27 NOTE — Assessment & Plan Note (Signed)
[place] [facility]                         ELECTROPHYSIOLOGY OFFICE NOTE   [name], [name]                        MRN:          [id number]  DATE:05/21/2008                            DOB:          04/23/1941    Russell Browning is seen today in followup for sinus node dysfunction status  post pacemaker implantation about a year ago.  He is doing well with  improved energy.   MEDICATIONS:  Plavix 75, atenolol 50, clonazepam, omeprazole, as well as  aspirin.   PHYSICAL EXAMINATION:  VITAL SIGNS:  His blood pressure is well  controlled at 120/68, his pulse is 60.  LUNGS:  Clear.  HEART:  Sounds are regular.  EXTREMITIES:  Without edema.  The device pocket is well healed.   Interrogation of his Medtronic Adapta pulse generator demonstrates P  wave of 1.4, impedance of 552, and threshold of 0.62 at 0.4. The R wave  was 11 with the patient's impedance of 1350 and threshold of 1.2 at 0.4.  Battery voltage was 2.8.   IMPRESSION:  1. Sinus node dysfunction.  2. Status post pacer for the above.   We will see Russell Browning in a year's time.  He is doing well at this time.     Duke Salvia, MD, [facility]  Electronically Signed    SCK/MedQ  DD: 05/21/2008  DT: 05/22/2008  Job #: [id number]

## 2010-10-30 NOTE — Discharge Summary (Signed)
NAME:  Russell Browning, Russell Browning NO.:  [id number]   MEDICAL RECORD NO.:  [id number]          PATIENT TYPE:  OIB   LOCATION:  6524                         FACILITY:  [facility]   PHYSICIAN:  Learta Codding, M.D. LHCDATE OF BIRTH:  22-Dec-1940   DATE OF ADMISSION:  12/18/2004  DATE OF DISCHARGE:  12/19/2004                                 DISCHARGE SUMMARY   PROCEDURES:  Taxus stenting right coronary artery December 18, 2004.   REASON FOR ADMISSION:  [name] is a 70 year old male, with a history of  nonobstructive coronary artery disease by prior cardiac catheterization in  1999, recently referred to Dr. Andee Lineman for evaluation of chest discomfort.  He was referred for a diagnostic cardiac catheterization on December 16, 2004,  performed by Dr. Antoine Poche, revealing severe right coronary artery disease  and normal ventricular function. The patient now presents for elective  percutaneous intervention.   LABORATORY DATA:  Hemoglobin 13, hematocrit 38, platelet 175,000 at  discharge.  Sodium 138, potassium 3.8, BUN 10, creatinine 1.2, glucose 98 at  discharge.  CPK 50/0.5, (post PCI).   HOSPITAL COURSE:  The patient presented for elective percutaneous  intervention, performed by Dr. Bonnee Quin, with successful Taxus stenting  of the right coronary artery, (x3), with no noted complications.   The patient was kept for overnight observation, cleared for discharge the  following morning in hemodynamically stable condition. Minimal oozing of the  right groin was noted, but with no evidence of hematoma or bruit on  auscultation.   MEDICATIONS AT DISCHARGE:  1.  Plavix 75 mg daily, (at least one year).  2.  Coated aspirin 325 mg daily.  3.  Zocor 40 mg daily.  4.  Prevacid 30 mg daily.  5.  Xanax 1 mg q.h.s.  6.  Zoloft 15 mg b.i.d.  7.  Biaxin as previously directed.  8.  Nitroglycerin 0.4 mg as direct instructions as outlined per cardiac      catheterization discharge sheet.   The patient  will follow up with Dr. Donnella Bi in [place] in two weeks -  arrangements to be made through our office.   DISCHARGE DIAGNOSES:  1.  Severe single-vessel coronary artery disease.      1.  Status post Taxus stenting right coronary artery, (x3), December 18, 2004.      2.  Normal left ventricular function.  2.  Permanent pacemaker.      1.  History of sick sinus syndrome/syncope.  3.  History of transient ischemic attack.  4.  History of subconjunctival hemorrhage.       GS/MEDQ  D:  12/19/2004  T:  12/19/2004  Job:  [id number]   cc:   Marcelino Duster  [address] [place]  [place] [address]  Fax: [phone]

## 2010-10-30 NOTE — Assessment & Plan Note (Signed)
[facility]                          [facility] NOTE   Russell Browning, Russell Browning                        MRN:          [id number]  DATE:06/01/2006                            DOB:          01/16/1941    HISTORY OF PRESENT ILLNESS:  The patient presents for followup today.  He has been doing well.  He has known coronary artery disease.  He had a  stress test done earlier this year which showed a small area of infra-  apical ischemia.  The patient, however, denies any substernal chest  pain.  He has no orthopnea or PND and has been clinically stable.  He  does report easy bruisability from his combination aspirin/Plavix  therapy.   MEDICATIONS:  1. Zoloft.  2. Prevacid.  3. Xanax.  4. Plavix 75 mg a day,  5. Enteric-coated aspirin 81 mg a day.  6. Zocor 40 mg a day.  7. Imdur 30 mg a day.  8. Vitamin B12 1000 mg p.o. daily.   PHYSICAL EXAMINATION:  VITAL SIGNS:  Blood pressure is 114/70, heart  rate 72, weight is 146 pounds.  NECK:  Normal carotid upstroke, no carotid bruits.  LUNGS:  Clear breath sounds bilaterally.  HEART:  Regular rate and rhythm, normal S1 and S2.  No murmurs, rubs or  gallops.  ABDOMEN:  Soft.  EXTREMITIES:  No cyanosis, clubbing or edema.   PROBLEM LIST:  1. Coronary artery disease.      a.     Status post percutaneous coronary intervention of the       proximal mid and distal right coronary artery.      b.     Ejection fraction 55%.  2. Antiplatelet therapy with aspirin and Plavix.  3. Status post pacemaker for sick sinus syndrome.  Follow up in the      pacemaker clinic.  4. History of prostate cancer.  5. History of dyslipidemia, on Zocor.   PLAN:  1. The patient has been doing quite well.  He can continue his current      medical regimen.  2. I have advised the patient to stay at least for 2 years on      combination aspirin/Plavix therapy.  In July 2008 we can revisit      the issue of discontinuation of  Plavix.     Learta Codding, MD,FACC  Electronically Signed    GED/MedQ  DD: 06/01/2006  DT: 06/01/2006  Job #: [id number]

## 2010-10-30 NOTE — Cardiovascular Report (Signed)
NAME:  Russell Browning, Russell Browning NO.:  [id number]   MEDICAL RECORD NO.:  [id number]          PATIENT TYPE:  OIB   LOCATION:  6524                         FACILITY:  [facility]   PHYSICIAN:  Arturo Morton. Riley Kill, M.D. [facility] OF BIRTH:  [birth year]   DATE OF PROCEDURE:  12/18/2004  DATE OF DISCHARGE:                              CARDIAC CATHETERIZATION   INDICATIONS:  Russell Browning is a 70 year old gentleman who presents with  exertional angina.  He had nonobstructive disease several years ago.  Review  of the angiograms reveals a 50% to perhaps 60% stenosis of the proximal LAD.  The right coronary demonstrates about 70% narrowing at the proximal mid-  junction followed by an 95% stenosis in the midvessel and a 99% stenosis  distally.  EF was estimated at 55% by Dr. Antoine Poche.  The patient was  pretreated with Plavix and brought to the catheterization laboratory after  informed consent.   PROCEDURE:  1.  Percutaneous stenting of the distal right coronary artery.  2.  Percutaneous stenting of the mid right coronary artery.  3.  Percutaneous stenting of the proximal right coronary artery.   DESCRIPTION OF PROCEDURE:  The patient was brought to the catheterization  laboratory and prepped and draped in the usual fashion.  Through an anterior  puncture, the right femoral artery was easily entered.  A 6-French sheath  was placed.  Views of the right coronary artery were then obtained in LAO  and RAO projections.  Bivalirudin was given according to protocol and ACT  checked and found to be in excess of 300 seconds.  Initially, a high-torque  floppy wire was placed down across the various lesions.  The lesions were  obstructive with crossing of a 12- mm-length 2.25 Quantum Maverick balloon.  Dilatations were done in the distal and mid lesions.  The distal lesion was  then stented using a 2.5 x 16 TAXUS drug-eluting stent.  This was dilated to  14 atmospheres.  The mid lesion was then  stented using a TAXUS 2.75 x 12  drug-eluting stent and this was taken to 16 atmospheres.  The distal stent  was then post-dilated with a 3-mm PowerSail balloon and likewise the mid  stent was dilated with 3-mm PowerSail balloon as well.  There was marked  improvement in both lesions.  The proximal lesion was then crossed and  primarily stented using a 2.75 x 16 TAXUS Express II stent.  This was taken  to 16 atmospheres and post-dilated the 12 atmospheres with a 3.2 x 13  PowerSail balloon throughout the course of the stent.  There was marked  improvement in the appearance of the artery.  All catheters were  subsequently removed and final films obtained.  The femoral sheath was sewn  into place and he was taken to the holding area in satisfactory clinical  condition.   ANGIOGRAPHIC DATA:  The right coronary artery demonstrates about a 70% area  of narrowing at the first bend.  This is between the proximal and midvessel.  There is then an area of about 30% to 40% narrowing in  the midvessel and the  vessel then opens up and there is a focal 90% area of constriction just past  the right ventricular branch.  In the distal vessel was a fairly short focal  95% constriction as well.  There was slow antegrade flow into the PDA.  Following the final dilatations, the proximal stenosis of 70% is reduced to  0%, the mid stenosis of 90% is reduced to 0%, the distal stenosis of 95% is  reduced to 0%.  There is TIMI-3 flow into the distal vessel and no evidence  of significant edge tear.  The PDA and the posterolateral system fill well.   CONCLUSION:  Successful percutaneous stenting of the proximal, mid and  distal right coronary arteries.   DISPOSITION:  The patient has had subconjunctival hemorrhage in the past;  however, he was fairly adamant about the use of drug-eluting stents.  He  will need to be on Plavix for preferably 1 year; the labeling for TAXUS drug-  eluting stents is for essentially 6  months, but if he tolerates it, we would  prefer for the entire year.  He will follow up with Dr. Andee Lineman in [place].       TDS/MEDQ  D:  12/18/2004  T:  12/19/2004  Job:  [id number]   cc:   Marcelino Duster  [address] [place]  [place] [address]  Fax: [phone]   Learta Codding, M.D. [facility]  [address]  [address]  [place]  [place] [address]   [facility]

## 2010-10-30 NOTE — Cardiovascular Report (Signed)
NAME:  Russell Browning, MASSAR NO.:  [id number]   MEDICAL RECORD NO.:  [id number]          PATIENT TYPE:  OIB   LOCATION:  6501                         FACILITY:  [facility]   PHYSICIAN:  Rollene Rotunda, M.D.   DATE OF BIRTH:  Jun 11, 1941   DATE OF PROCEDURE:  12/16/2004  DATE OF DISCHARGE:                              CARDIAC CATHETERIZATION   PRIMARY PHYSICIAN:  Alba Cory, MD   CARDIOLOGIST:  Learta Codding, M.D., [facility] [place]   PROCEDURE:  Left heart catheterization/coronary arteriography.   INDICATIONS:  Patient with exertional angina.  He had previous  catheterization some years ago with nonobstructive coronary disease.   PROCEDURE NOTE:  Left heart catheterization was performed through the right  femoral artery.  The artery was cannulated using an anterior wall puncture.  A 4-French arterial sheath was inserted via the modified Seldinger  technique.  A pre-formed Judkins and a pigtail catheter were utilized.  The  patient tolerated the procedure well and left the lab in stable condition.   RESULTS:  Hemodynamics:  LV 134/13, AO 133/95.   Coronaries:  The left main was long and normal.  The LAD had ostial 40%  stenosis.  There were diffuse luminal irregularities.  There was a mid 30%  to 40% stenosis.  There was a mid diagonal which was large and normal.  The  circumflex in the AV groove was normal.  There was a very large mid obtuse  marginal which was normal.  The right coronary artery was a dominant vessel.  There was a mid 95% stenosis.  There was a distal 99% stenosis before the  PDA.  There were diffuse luminal irregularities between the PDA and  posterolateral.  The PDA was moderate-sized and normal.  The posterolateral  was small and normal.   Left ventriculogram:  The left ventriculogram was obtained in the RAO  projection.  The EF was approximately 55% with no regional wall motion  abnormalities.   CONCLUSION:  Severe right coronary artery  disease.   PLAN:  The patient will have elective percutaneous revascularization of the  right coronary artery.  He will be pre-treated with Plavix.  He will be  given sublingual nitroglycerin and Imdur for home.  He was instructed not to  do any activities.  He was instructed to come to the emergency room should  he have any resting chest discomfort or increase or change in symptoms.       JH/MEDQ  D:  12/16/2004  T:  12/16/2004  Job:  [id number]   cc:   Marcelino Duster  [address] [place]  [place] [address]  Fax: [phone]

## 2010-10-30 NOTE — Assessment & Plan Note (Signed)
[facility]                           ELECTROPHYSIOLOGY OFFICE NOTE   QUANTEZ, SCHNYDER                        MRN:          [id number]  DATE:04/28/2006                            DOB:          06/14/1941    Mr. Sagona was seen in the device clinic today April 28, 2006 in the [place]  office for routine followup with his Guidant Discovery dual-chamber  pacemaker model 1274 implanted April of 1998 at [place].  Upon  interrogation, battery voltage is approximately 30% which was unchanged from  6 months ago.  In the atrium, intrinsic amplitude is 8.4 mV with an  impedence of 640 ohms and threshold of 0.5 volts at 0.4 msec.  In the  ventricle, intrinsic amplitude is 15.9 mV, with an impedence of 1220 ohms  which is trending in the same range.  Threshold remains stable at 0.6 volts  of 0.4 msec.  No programming changes were made at this time.  He will  continue with transtelephonic checks through [facility] every month and will be  seen in one year's time for followup in clinic.      Cleatrice Burke, RN  Electronically Signed      Duke Salvia, MD, [facility]  Electronically Signed   CF/MedQ  DD: 04/28/2006  DT: 04/28/2006  Job #: [id number]

## 2010-10-30 NOTE — Assessment & Plan Note (Signed)
[facility]                          [facility] NOTE   CASSON, CATENA                        MRN:          [id number]  DATE:09/30/2006                            DOB:          24-Apr-1941    PRIMARY CARDIOLOGIST:  Dr. Andee Lineman   PRIMARY ELECTROPHYSIOLOGIST:  Dr. Sherryl Manges   REASON FOR VISIT:  Mrs. Bosler is a 70 year old male, with known coronary  artery disease and history of sinus node dysfunction status post Guidant  pacemaker implantation at [facility] in 1999, who  now presents to the clinic with complaint of recent episode of tachy  palpitations.  The patient states that this is something new and he  described his symptoms as feeling his heart racing.  This lasted only  approximately 3 minutes in duration and was associated with some mild  chest tightness and left arm numbness, which resolved, following  resolution of the tachy palpitations.  He did not experience any  presyncope/syncope and has not had any recurrent episodes.   The patient states that he had recent lab work, per Dr. Reuel Boom, all of  which were within normal limits, including a TSH level.   Electrocardiogram today reveals atrial pacing with ventricular sensing  at 60 bpm.   CURRENT MEDICATIONS:  1. Plavix.  2. Aspirin 81 daily.  3. Zocor 40.  4. Imdur 30.  5. Xanax 1 mg nightly.  6. Prevacid.  7. Zoloft 100 daily.   PHYSICAL EXAMINATION:  Blood pressure 109/70, pulse 59 regular, weight  150.6.  GENERAL:  A 70 year old male, sitting upright in no distress.  HEENT:  Normocephalic.  Atraumatic.  NECK:  Palpable carotid pulse without bruits; no JVD.  LUNGS:  Clear to auscultation in all fields.  HEART:  Regular rate and rhythm (S1), no murmurs, rubs or gallops.  ABDOMEN:  Benign.  EXTREMITIES:  Intact pulses with no edema.  NEURO:  No focal deficit.   IMPRESSION:  1. Transient tachy palpitations.      a.     Suspect paroxysmal  supraventricular tachycardia (i.e. atrial       fibrillation).  2. Sinus node dysfunction.      a.     Status post Guidant dual chamber pacemaker implantation 1999       [facility]).  3. Severe single vessel coronary artery disease.      a.     Status post TAXUS stenting right coronary artery (3), July       2006.      b.     Preserved left ventricular function.  4. History of transient ischemic attack.  5. Dyslipidemia.      a.     Followed by Dr. Reuel Boom.   PLAN:  1. Interrogate pacemaker for further evaluation of possible recent      transient dysrhythmia.  2. Schedule return clinic followup with myself and Dr. Andee Lineman in 3      months, representing 2 years since undergoing percutaneous      intervention with drug-eluting stents.   ADDENDUM:  Upon further review with the patient regarding  his current  medication list, we found that he actually is on a beta blocker and was  placed on Atenolol 25 mg daily about 3 months ago, per Dr. Donzetta Sprung.  However, he is no longer on Imdur. We therefore have instructed him to  increase his atenolol from 25 to 50 daily and to take an additional  atenolol 25 mg on a p.r.n. basis, for recurrent tachy palpitations. He  is also on Cialis p.r.n.      Gene Serpe, PA-C  Electronically Signed      Learta Codding, MD,FACC  Electronically Signed   GS/MedQ  DD: 09/30/2006  DT: 09/30/2006  Job #: [id number]   cc:   Donzetta Sprung

## 2010-10-30 NOTE — Assessment & Plan Note (Signed)
[facility]                          [facility]ICE NOTE   KYPTON, ELTRINGHAM                        MRN:          [id number]  DATE:09/30/2006                            DOB:          11/12/1940      Rozell Searing, PA-C  Electronically Signed    GS/MedQ  DD: 09/30/2006  DT: 09/30/2006  Job #: [id number]

## 2010-11-23 ENCOUNTER — Encounter: Payer: Self-pay | Admitting: Cardiology

## 2010-11-27 ENCOUNTER — Ambulatory Visit (INDEPENDENT_AMBULATORY_CARE_PROVIDER_SITE_OTHER): Payer: Medicare Other | Admitting: *Deleted

## 2010-11-27 DIAGNOSIS — I495 Sick sinus syndrome: Secondary | ICD-10-CM

## 2010-11-27 NOTE — Progress Notes (Signed)
Pacer check in clinic  

## 2010-12-01 ENCOUNTER — Ambulatory Visit (INDEPENDENT_AMBULATORY_CARE_PROVIDER_SITE_OTHER): Payer: Medicare Other | Admitting: Cardiology

## 2010-12-01 ENCOUNTER — Encounter: Payer: Self-pay | Admitting: Cardiology

## 2010-12-01 VITALS — BP 116/52 | HR 72 | Ht 70.0 in | Wt 146.0 lb

## 2010-12-01 DIAGNOSIS — T82110A Breakdown (mechanical) of cardiac electrode, initial encounter: Secondary | ICD-10-CM

## 2010-12-01 DIAGNOSIS — E785 Hyperlipidemia, unspecified: Secondary | ICD-10-CM

## 2010-12-01 DIAGNOSIS — T82190A Other mechanical complication of cardiac electrode, initial encounter: Secondary | ICD-10-CM

## 2010-12-01 DIAGNOSIS — I259 Chronic ischemic heart disease, unspecified: Secondary | ICD-10-CM

## 2010-12-01 DIAGNOSIS — Z950 Presence of cardiac pacemaker: Secondary | ICD-10-CM

## 2010-12-01 DIAGNOSIS — G459 Transient cerebral ischemic attack, unspecified: Secondary | ICD-10-CM

## 2010-12-01 MED ORDER — ASPIRIN 81 MG PO TABS: 81.0000 mg | ORAL_TABLET | Freq: Every day | ORAL | Status: AC

## 2010-12-01 NOTE — Patient Instructions (Signed)
Your physician wants you to follow-up in: 6 months. You will receive a reminder letter in the mail one-two months in advance. If you don't receive a letter, please call our office to schedule the follow-up appointment. Decrease Aspirin to 81 mg daily.

## 2010-12-01 NOTE — Assessment & Plan Note (Signed)
The patient is status post drug-eluting stent to the midright coronary artery he has single-vessel coronary artery disease and stent was performed in 04/15/2010. He also prior stenting to the right coronary artery in 2006. He has no recurrent chest pain. He has remained asymptomatic. Is currently still taking Plavix. Aspirin was reduced to 81 mg by mouth daily. No further ischemia testing is indicated currently

## 2010-12-01 NOTE — Progress Notes (Signed)
HPI The patient is a 70 year old male with a history of sinus node dysfunction status post pacemaker implantation and coronary artery disease with a Taxus stent in 2006. He has a history of TIA. Is on Plavix. There is no prior history of atrial fibrillation. He also received a drug-eluting stent to the right coronary 04/15/2010. He reports no recent substernal chest pain. He does have some neck pain and left shoulder pain. Aspirin has been reduced to 81 mg by mouth daily. The patient reports no shortness of breath nausea or vomiting fever or chills. Blood work is done by his primary care physician and reportedly his cholesterol has been well controlled. Of note is that the patient is followed closely by Dr. Johney Frame because of her deteriorating atrial lead impedance which has been causing some pectoral stimulation.  Allergies  Allergen Reactions  . Altace   . Penicillins   . Phenazopyridine Hcl     Current Outpatient Prescriptions on File Prior to Visit  Medication Sig Dispense Refill  . atenolol (TENORMIN) 50 MG tablet Take 1/4 tablet every day       . clonazePAM (KLONOPIN) 1 MG tablet Take 1 1/2 tablet by mouth once a day at HS       . clopidogrel (PLAVIX) 75 MG tablet Take 75 mg by mouth daily.        . nitroGLYCERIN (NITROSTAT) 0.4 MG SL tablet Place 0.4 mg under the tongue every 5 (five) minutes as needed.        . pantoprazole (PROTONIX) 40 MG tablet Take 40 mg by mouth daily.        . sertraline (ZOLOFT) 100 MG tablet Take 100 mg by mouth daily.        . simvastatin (ZOCOR) 40 MG tablet Take 40 mg by mouth at bedtime.        . vitamin B-12 (CYANOCOBALAMIN) 1000 MCG tablet Take 1,000 mcg by mouth daily.        Marland Kitchen DISCONTD: aspirin 325 MG tablet Take 325 mg by mouth daily.        Marland Kitchen DISCONTD: Probiotic Product (PRO-FLORA CONCENTRATE) CAPS Take 1 capsule by mouth daily.          Past Medical History  Diagnosis Date  . Ischemic     tansient ischemic attack  . Dyslipidemia   . CAD  (coronary artery disease)     unspecified site  . Sinoatrial node dysfunction   . Sinus node dysfunction     Status post pacemaker implantation and generator replacement  . Single vessel coronary artery disease     severe, status post Taxus at right coronary artery in 2006 and preserved left ventricular  function  . Dyslipidemia     Drug-eluting stent to the right coronary artery November 2,2011, preserved LV function    No past surgical history on file.  No family history on file.  History   Social History  . Marital Status: Married    Spouse Name: N/A    Number of Children: N/A  . Years of Education: N/A   Occupational History  . Not on file.   Social History Main Topics  . Smoking status: Former Smoker -- 1.0 packs/day for 5 years    Types: Cigarettes    Quit date: 06/14/1965  . Smokeless tobacco: Former Neurosurgeon    Types: Chew    Quit date: 06/14/1978   Comment: chewed tobacco 5 years 1PPD  . Alcohol Use: Not on file  . Drug Use: Not on  file  . Sexually Active: Not on file   Other Topics Concern  . Not on file   Social History Narrative  . No narrative on file   EAV:[id number] positives as outlined above. The remainder of the 18  point review of systems is negative  PHYSICAL EXAM BP 116/52  Pulse 72  Ht 5\' 10"  (1.778 m)  Wt 146 lb (66.225 kg)  BMI 20.95 kg/m2  General: Well-developed, well-nourished in no distress Head: Normocephalic and atraumatic Eyes:PERRLA/EOMI intact, conjunctiva and lids normal Ears: No deformity or lesions Mouth:normal dentition, normal posterior pharynx Neck: Supple, no JVD.  No masses, thyromegaly or abnormal cervical nodes Lungs: Normal breath sounds bilaterally without wheezing.  Normal percussion Cardiac: regular rate and rhythm with normal S1 and S2, no S3 or S4.  PMI is normal.  No pathological murmurs Abdomen: Normal bowel sounds, abdomen is soft and nontender without masses, organomegaly or hernias noted.  No  hepatosplenomegaly MSK: Back normal, normal gait muscle strength and tone normal Vascular: Pulse is normal in all 4 extremities Extremities: No peripheral pitting edema Neurologic: Alert and oriented x 3 Skin: Intact without lesions or rashes Lymphatics: No significant adenopathy Psychologic: Normal affect   ECG: Not available  ASSESSMENT AND PLAN

## 2010-12-01 NOTE — Assessment & Plan Note (Signed)
No recurrence of TIA. No evidence of atrial fibrillation. Patient remains on Plavix.

## 2010-12-01 NOTE — Assessment & Plan Note (Signed)
Patient has a pacemaker for sinoatrial dysfunction. His atrial lead has shown changes in lead impedance and ask also pectoralis stimulation. This probably is followed closely by Dr. Johney Frame. The patient may need lead revision in the future.

## 2010-12-01 NOTE — Assessment & Plan Note (Signed)
Followed by the patient's primary care physician 

## 2011-02-25 ENCOUNTER — Ambulatory Visit (INDEPENDENT_AMBULATORY_CARE_PROVIDER_SITE_OTHER): Payer: Medicare Other | Admitting: *Deleted

## 2011-02-25 ENCOUNTER — Encounter: Payer: Self-pay | Admitting: Internal Medicine

## 2011-02-25 ENCOUNTER — Other Ambulatory Visit: Payer: Self-pay

## 2011-02-25 DIAGNOSIS — I495 Sick sinus syndrome: Secondary | ICD-10-CM

## 2011-02-25 DIAGNOSIS — I459 Conduction disorder, unspecified: Secondary | ICD-10-CM

## 2011-02-28 LAB — REMOTE PACEMAKER DEVICE
AL IMPEDENCE PM: 196 Ohm
ATRIAL PACING PM: 97
BAMS-0001: 175 {beats}/min
BATTERY VOLTAGE: 2.79 V
RV LEAD IMPEDENCE PM: 1244 Ohm

## 2011-03-09 ENCOUNTER — Encounter: Payer: Self-pay | Admitting: *Deleted

## 2011-03-12 NOTE — Progress Notes (Signed)
Pacer checked by remote 

## 2011-04-19 ENCOUNTER — Other Ambulatory Visit: Payer: Self-pay | Admitting: Cardiology

## 2011-05-25 ENCOUNTER — Encounter: Payer: Self-pay | Admitting: Cardiology

## 2011-05-25 ENCOUNTER — Ambulatory Visit (INDEPENDENT_AMBULATORY_CARE_PROVIDER_SITE_OTHER): Payer: Medicare Other | Admitting: Cardiology

## 2011-05-25 VITALS — BP 124/76 | HR 68 | Ht 70.0 in | Wt 152.0 lb

## 2011-05-25 DIAGNOSIS — I2510 Atherosclerotic heart disease of native coronary artery without angina pectoris: Secondary | ICD-10-CM

## 2011-05-25 DIAGNOSIS — G459 Transient cerebral ischemic attack, unspecified: Secondary | ICD-10-CM

## 2011-05-25 DIAGNOSIS — I495 Sick sinus syndrome: Secondary | ICD-10-CM

## 2011-05-25 DIAGNOSIS — I259 Chronic ischemic heart disease, unspecified: Secondary | ICD-10-CM

## 2011-05-25 NOTE — Progress Notes (Signed)
Russell Bottoms, MD, [facility] ABIM Board Certified in Adult Cardiovascular Medicine,Internal Medicine and Critical Care Medicine    CC: Followup patient with coronary artery disease  HPI:  The patient is a 70 year old male with a history of coronary artery disease, status post stent placement most recently drug-eluting stent to the RCA in 2011. Is also status post pacemaker implantation and has increased atrial lead impedance followed by Dr. Johney Frame. From a cardiac standpoint the patient is been doing well. He reports no chest pain or shortness of breath. He has no orthopnea PND palpitations presyncope or syncope. Has occasional right shoulder pain which is nonexertional and related to movement. Of note is that the patient also recently has been diagnosed with prostate cancer.     PMH: reviewed and listed in Problem List in Electronic Records (and see below) Past Medical History  Diagnosis Date  . Ischemic     tansient ischemic attack  . Dyslipidemia   . CAD (coronary artery disease)     unspecified site  . Sinoatrial node dysfunction   . Sinus node dysfunction     Status post pacemaker implantation and generator replacement  . Single vessel coronary artery disease     severe, status post Taxus at right coronary artery in 2006 and preserved left ventricular  function  . Dyslipidemia     Drug-eluting stent to the right coronary artery November 2,2011, preserved LV function  . Pacemaker lead failure      followed by Dr. Johney Frame.impedance change in the atrial lead. May require revision in the future associated with pectoralis stimulation      Allergies/SH/FHX : available in Electronic Records for review  Medications: Current Outpatient Prescriptions  Medication Sig Dispense Refill  . aspirin 81 MG tablet Take 1 tablet (81 mg total) by mouth daily.      Marland Kitchen atenolol (TENORMIN) 50 MG tablet Take 1/4 tablet every day       . clonazePAM (KLONOPIN) 1 MG tablet Take 1 1/2 tablet by mouth  once a day at HS       . clopidogrel (PLAVIX) 75 MG tablet TAKE 1 TABLET BY MOUTH DAILY  30 tablet  1  . leuprolide (LUPRON) 11.25 MG injection Inject 11.25 mg into the muscle every 3 (three) months.        . nitroGLYCERIN (NITROSTAT) 0.4 MG SL tablet Place 0.4 mg under the tongue every 5 (five) minutes as needed.        . pantoprazole (PROTONIX) 40 MG tablet Take 40 mg by mouth daily.        . sertraline (ZOLOFT) 100 MG tablet Take 100 mg by mouth daily.        . simvastatin (ZOCOR) 40 MG tablet Take 40 mg by mouth at bedtime.        . vitamin B-12 (CYANOCOBALAMIN) 1000 MCG tablet Take 1,000 mcg by mouth daily.          ROS: No nausea or vomiting. No fever or chills.No melena or hematochezia.No bleeding.No claudication.  Physical Exam: BP 124/76  Pulse 68  Ht 5\' 10"  (1.778 m)  Wt 152 lb (68.947 kg)  BMI 21.81 kg/m2 General: Well-nourished white male in no distress Neck: Normal carotid upstroke no carotid bruits. No thyromegaly nonnodular thyroid. Lungs: Clear breath sounds bilaterally no wheezing Cardiac: Regular rate and rhythm with normal S1-S2 no murmur rubs or gallops Vascular: No edema. Normal distal pulses Skin: Warm and dry  12lead ECG: Normal sinus rhythm otherwise normal tracing Limited  bedside ECHO:N/A   Assessment and Plan

## 2011-05-25 NOTE — Patient Instructions (Signed)
Continue all current medications. Your physician wants you to follow up in: 6 months.  You will receive a reminder letter in the mail one-two months in advance.  If you don't receive a letter, please call our office to schedule the follow up appointment   

## 2011-05-25 NOTE — Assessment & Plan Note (Signed)
No recurrent TIA. No history of atrial fibrillation. No indication for Coumadin.

## 2011-05-25 NOTE — Assessment & Plan Note (Signed)
Status post multiple prior stents. We'll continue dual antiplatelet therapy.

## 2011-05-25 NOTE — Assessment & Plan Note (Signed)
Status post pacemaker implantation with high atrial lead impedance followed by Dr. Johney Frame

## 2011-06-01 ENCOUNTER — Encounter (HOSPITAL_COMMUNITY): Payer: Self-pay

## 2011-06-01 ENCOUNTER — Emergency Department (HOSPITAL_COMMUNITY)
Admission: EM | Admit: 2011-06-01 | Discharge: 2011-06-01 | Disposition: A | Discharge status: Home / Self Care | Payer: Medicare Other | Attending: Emergency Medicine | Admitting: Emergency Medicine

## 2011-06-01 ENCOUNTER — Emergency Department (HOSPITAL_COMMUNITY): Payer: Medicare Other

## 2011-06-01 DIAGNOSIS — Z7982 Long term (current) use of aspirin: Secondary | ICD-10-CM | POA: Insufficient documentation

## 2011-06-01 DIAGNOSIS — Z950 Presence of cardiac pacemaker: Secondary | ICD-10-CM | POA: Insufficient documentation

## 2011-06-01 DIAGNOSIS — I2510 Atherosclerotic heart disease of native coronary artery without angina pectoris: Secondary | ICD-10-CM | POA: Insufficient documentation

## 2011-06-01 DIAGNOSIS — M25551 Pain in right hip: Secondary | ICD-10-CM

## 2011-06-01 DIAGNOSIS — R269 Unspecified abnormalities of gait and mobility: Secondary | ICD-10-CM | POA: Insufficient documentation

## 2011-06-01 DIAGNOSIS — M25559 Pain in unspecified hip: Secondary | ICD-10-CM | POA: Insufficient documentation

## 2011-06-01 DIAGNOSIS — C61 Malignant neoplasm of prostate: Secondary | ICD-10-CM

## 2011-06-01 DIAGNOSIS — E785 Hyperlipidemia, unspecified: Secondary | ICD-10-CM | POA: Insufficient documentation

## 2011-06-01 DIAGNOSIS — Z79899 Other long term (current) drug therapy: Secondary | ICD-10-CM | POA: Insufficient documentation

## 2011-06-01 LAB — CBC
Hemoglobin: 12.8 g/dL — ABNORMAL LOW (ref 13.0–17.0)
MCH: 30.8 pg (ref 26.0–34.0)
MCHC: 34.3 g/dL (ref 30.0–36.0)
Platelets: 135 10*3/uL — ABNORMAL LOW (ref 150–400)
RDW: 12.8 % (ref 11.5–15.5)

## 2011-06-01 LAB — URINALYSIS, ROUTINE W REFLEX MICROSCOPIC
Bilirubin Urine: NEGATIVE
Ketones, ur: NEGATIVE mg/dL
Leukocytes, UA: NEGATIVE
Nitrite: NEGATIVE
Specific Gravity, Urine: 1.009 (ref 1.005–1.030)
Urobilinogen, UA: 0.2 mg/dL (ref 0.0–1.0)
pH: 7.5 (ref 5.0–8.0)

## 2011-06-01 LAB — DIFFERENTIAL
Basophils Absolute: 0 10*3/uL (ref 0.0–0.1)
Basophils Relative: 1 % (ref 0–1)
Eosinophils Absolute: 0.2 10*3/uL (ref 0.0–0.7)
Neutro Abs: 2.7 10*3/uL (ref 1.7–7.7)
Neutrophils Relative %: 51 % (ref 43–77)

## 2011-06-01 LAB — BASIC METABOLIC PANEL
Chloride: 103 mEq/L (ref 96–112)
GFR calc Af Amer: 84 mL/min — ABNORMAL LOW (ref >90)
GFR calc non Af Amer: 72 mL/min — ABNORMAL LOW (ref >90)
Potassium: 4.5 mEq/L (ref 3.5–5.1)
Sodium: 141 mEq/L (ref 135–145)

## 2011-06-01 MED ORDER — OXYCODONE-ACETAMINOPHEN 5-325 MG PO TABS
2.0000 | ORAL_TABLET | Freq: Once | ORAL | Status: AC
  Administered 2011-06-01: 2 via ORAL
  Filled 2011-06-01 (×2): qty 1

## 2011-06-01 MED ORDER — OXYCODONE-ACETAMINOPHEN 5-325 MG PO TABS: 2.0000 | ORAL_TABLET | ORAL | Status: AC | PRN

## 2011-06-01 NOTE — ED Notes (Signed)
Patient did not come when called

## 2011-06-01 NOTE — ED Provider Notes (Signed)
History     CSN: [id number] Arrival date & time: 06/01/2011 11:23 AM   First MD Initiated Contact with Patient 06/01/11 1518      Chief Complaint  Patient presents with  . Hip Pain    (Consider location/radiation/quality/duration/timing/severity/associated sxs/prior treatment) HPI Comments: Patient here with worsening right hip pain - patient states was diagnosed with prostate cancer several years ago - underwent radiation and this improved the cancer until about 4 months ago when his repeat PSA was elevated at that time- he states his urologist then did a PET scan which showed activity in the right hip and the prostate.  He reports that since then the urologist has left town and now with worsening pain and would like to find a doctor here.  Patient is a 70 y.o. male presenting with hip pain. The history is provided by the patient and a friend. No language interpreter was used.  Hip Pain This is a new problem. The current episode started more than 1 month ago. The problem occurs constantly. The problem has been gradually worsening. Associated symptoms include arthralgias. Pertinent negatives include no abdominal pain, anorexia, change in bowel habit, chest pain, congestion, coughing, diaphoresis, fatigue, fever, joint swelling, myalgias, neck pain, numbness, sore throat, swollen glands, visual change, vomiting or weakness. The symptoms are aggravated by standing. He has tried nothing for the symptoms. The treatment provided no relief.    Past Medical History  Diagnosis Date  . Ischemic     tansient ischemic attack  . Dyslipidemia   . CAD (coronary artery disease)     unspecified site  . Sinoatrial node dysfunction   . Sinus node dysfunction     Status post pacemaker implantation and generator replacement  . Single vessel coronary artery disease     severe, status post Taxus at right coronary artery in 2006 and preserved left ventricular  function  . Dyslipidemia     Drug-eluting  stent to the right coronary artery November 2,2011, preserved LV function  . Pacemaker lead failure      followed by Dr. Johney Frame.impedance change in the atrial lead. May require revision in the future associated with pectoralis stimulation   . Cancer     History reviewed. No pertinent past surgical history.  No family history on file.  History  Substance Use Topics  . Smoking status: Former Smoker -- 1.0 packs/day for 5 years    Types: Cigarettes    Quit date: 06/14/1965  . Smokeless tobacco: Former Neurosurgeon    Types: Chew    Quit date: 06/14/1978   Comment: chewed tobacco 5 years 1PPD  . Alcohol Use: No      Review of Systems  Constitutional: Negative for fever, diaphoresis and fatigue.  HENT: Negative for congestion, sore throat and neck pain.   Respiratory: Negative for cough.   Cardiovascular: Negative for chest pain.  Gastrointestinal: Negative for vomiting, abdominal pain, anorexia and change in bowel habit.  Musculoskeletal: Positive for arthralgias and gait problem. Negative for myalgias, back pain and joint swelling.  Neurological: Negative for weakness and numbness.  All other systems reviewed and are negative.    Allergies  Altace; Penicillins; and Phenazopyridine hcl  Home Medications   Current Outpatient Rx  Name Route Sig Dispense Refill  . ASPIRIN 81 MG PO TABS Oral Take 1 tablet (81 mg total) by mouth daily.    . ATENOLOL 50 MG PO TABS Oral Take 50 mg by mouth daily.     Marland Kitchen BICALUTAMIDE 50 MG  PO TABS Oral Take 50 mg by mouth daily.      Marland Kitchen CLONAZEPAM 1 MG PO TABS Oral Take 1 mg by mouth at bedtime.     . CLOPIDOGREL BISULFATE 75 MG PO TABS  TAKE 1 TABLET BY MOUTH DAILY 30 tablet 1    Generic For:*PLAVIX 75MG  TABLET  . LEUPROLIDE ACETATE (3 MONTH) 11.25 MG IM KIT Intramuscular Inject 11.25 mg into the muscle every 3 (three) months. Next shot in January     . PANTOPRAZOLE SODIUM 20 MG PO TBEC Oral Take 20 mg by mouth 2 (two) times daily.      . SERTRALINE  HCL 100 MG PO TABS Oral Take 100 mg by mouth daily.      Marland Kitchen SIMVASTATIN 40 MG PO TABS Oral Take 40 mg by mouth at bedtime.      Marland Kitchen VITAMIN B-12 1000 MCG PO TABS Oral Take 1,000 mcg by mouth daily.      Marland Kitchen NITROGLYCERIN 0.4 MG SL SUBL Sublingual Place 0.4 mg under the tongue every 5 (five) minutes as needed.        BP 116/74  Pulse 88  Temp(Src) 97.9 F (36.6 C) (Oral)  Resp 20  Ht 5\' 11"  (1.803 m)  Wt 152 lb (68.947 kg)  BMI 21.20 kg/m2  SpO2 98%  Physical Exam  Nursing note and vitals reviewed. Constitutional: He is oriented to person, place, and time. He appears well-developed and well-nourished. No distress.  HENT:  Head: Normocephalic and atraumatic.  Right Ear: External ear normal.  Left Ear: External ear normal.  Mouth/Throat: Oropharynx is clear and moist. No oropharyngeal exudate.  Eyes: Conjunctivae are normal. Pupils are equal, round, and reactive to light. No scleral icterus.  Neck: Normal range of motion. Neck supple.  Cardiovascular: Normal rate, regular rhythm and normal heart sounds.  Exam reveals no gallop and no friction rub.   No murmur heard. Pulmonary/Chest: Effort normal and breath sounds normal. No respiratory distress. He exhibits no tenderness.  Abdominal: Soft. Bowel sounds are normal. He exhibits no distension. There is no tenderness.  Musculoskeletal:       Right hip: He exhibits tenderness and bony tenderness. He exhibits normal range of motion and no deformity.  Lymphadenopathy:    He has no cervical adenopathy.  Neurological: He is alert and oriented to person, place, and time. No cranial nerve deficit. Coordination normal.  Skin: Skin is warm and dry.  Psychiatric: He has a normal mood and affect. His behavior is normal. Judgment and thought content normal.    ED Course  Procedures (including critical care time)   Labs Reviewed  CBC  DIFFERENTIAL  BASIC METABOLIC PANEL  URINALYSIS, ROUTINE W REFLEX MICROSCOPIC  PSA   Results for orders  placed during the hospital encounter of 06/01/11  CBC      Component Value Range   WBC 5.3  4.0 - 10.5 (K/uL)   RBC 4.15 (*) 4.22 - 5.81 (MIL/uL)   Hemoglobin 12.8 (*) 13.0 - 17.0 (g/dL)   HCT 45.4 (*) 09.8 - 52.0 (%)   MCV 89.9  78.0 - 100.0 (fL)   MCH 30.8  26.0 - 34.0 (pg)   MCHC 34.3  30.0 - 36.0 (g/dL)   RDW 11.9  14.7 - 82.9 (%)   Platelets 135 (*) 150 - 400 (K/uL)  DIFFERENTIAL      Component Value Range   Neutrophils Relative 51  43 - 77 (%)   Neutro Abs 2.7  1.7 - 7.7 (K/uL)  Lymphocytes Relative 36  12 - 46 (%)   Lymphs Abs 1.9  0.7 - 4.0 (K/uL)   Monocytes Relative 8  3 - 12 (%)   Monocytes Absolute 0.4  0.1 - 1.0 (K/uL)   Eosinophils Relative 4  0 - 5 (%)   Eosinophils Absolute 0.2  0.0 - 0.7 (K/uL)   Basophils Relative 1  0 - 1 (%)   Basophils Absolute 0.0  0.0 - 0.1 (K/uL)  BASIC METABOLIC PANEL      Component Value Range   Sodium 141  135 - 145 (mEq/L)   Potassium 4.5  3.5 - 5.1 (mEq/L)   Chloride 103  96 - 112 (mEq/L)   CO2 30  19 - 32 (mEq/L)   Glucose, Bld 92  70 - 99 (mg/dL)   BUN 15  6 - 23 (mg/dL)   Creatinine, Ser 1.61  0.50 - 1.35 (mg/dL)   Calcium 9.8  8.4 - 09.6 (mg/dL)   GFR calc non Af Amer 72 (*) >90 (mL/min)   GFR calc Af Amer 84 (*) >90 (mL/min)  URINALYSIS, ROUTINE W REFLEX MICROSCOPIC      Component Value Range   Color, Urine YELLOW  YELLOW    APPearance CLEAR  CLEAR    Specific Gravity, Urine 1.009  1.005 - 1.030    pH 7.5  5.0 - 8.0    Glucose, UA NEGATIVE  NEGATIVE (mg/dL)   Hgb urine dipstick NEGATIVE  NEGATIVE    Bilirubin Urine NEGATIVE  NEGATIVE    Ketones, ur NEGATIVE  NEGATIVE (mg/dL)   Protein, ur NEGATIVE  NEGATIVE (mg/dL)   Urobilinogen, UA 0.2  0.0 - 1.0 (mg/dL)   Nitrite NEGATIVE  NEGATIVE    Leukocytes, UA NEGATIVE  NEGATIVE    Dg Hip Complete Right  06/01/2011  *RADIOLOGY REPORT*  Clinical Data: Right hip pain.  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  RIGHT HIP - COMPLETE 2+ VIEW  Comparison: None.   Findings: No focal sclerotic or lytic lesions is identified.  Both hips are located and there is no fracture.  No notable degenerative change about the hips, symphysis pubis or sacroiliac joints is identified.  Visualized lower lumbar spine is unremarkable.  IMPRESSION: Negative exam.  No plain film evidence of metastatic disease.  Original Report Authenticated By: Bernadene Bell. Maricela Curet, M.D.      Prostate cancer    MDM  I have spoken with Dr. Retta Diones who will be happy to see the patient in follow up.  The patient reports good pain relief with the percocet so I will continue this.  He will be instructed to call [facility] tomorrow and will get his records from his old urologist.          Scarlette Calico C. [place], [place] 06/01/11 1729

## 2011-06-01 NOTE — ED Notes (Signed)
Pt. Has cancer in his rt. Hip and the pain has been increasingly becoming worse. Difficult walking,  Pt. Has hx of prostate cancer.

## 2011-06-09 NOTE — ED Provider Notes (Signed)
Medical screening examination/treatment/procedure(s) were performed by non-physician practitioner and as supervising physician I was immediately available for consultation/collaboration.   Suzi Roots, MD 06/09/11 [phone]

## 2011-06-23 ENCOUNTER — Encounter: Payer: Self-pay | Admitting: Internal Medicine

## 2011-06-23 ENCOUNTER — Ambulatory Visit (INDEPENDENT_AMBULATORY_CARE_PROVIDER_SITE_OTHER): Payer: Medicare Other | Admitting: Internal Medicine

## 2011-06-23 DIAGNOSIS — Z950 Presence of cardiac pacemaker: Secondary | ICD-10-CM | POA: Diagnosis not present

## 2011-06-23 DIAGNOSIS — I495 Sick sinus syndrome: Secondary | ICD-10-CM | POA: Diagnosis not present

## 2011-06-23 DIAGNOSIS — T82110A Breakdown (mechanical) of cardiac electrode, initial encounter: Secondary | ICD-10-CM

## 2011-06-23 NOTE — Assessment & Plan Note (Signed)
S/p PPM Dr Koren Bound generator change note from 2008 is reviewed today.  An insulation breach was noted at that time.  The lead was felt to not require replacement due to suitable lead measurements then.  Subsequently, his atrial lead impedance has declined significantly, though the lead's sensing and pacing thresholds are preserved.  The patient has opted to follow the lead without revision at this time.  I think that this is the correct approach. If he develops inadequate sensing and pacing, then we could consider lead revision at that time, otherwise ,I would plan lead revision at the time of his next generator change.  No changes today

## 2011-06-23 NOTE — Progress Notes (Signed)
PCP:  Donzetta Sprung, MD Primary Cardiologist:  Andee Lineman  The patient presents today for routine electrophysiology followup.  Since last being seen in our clinic, the patient reports doing reasonably well.  He has been found to have bony metastasis of his prostate CA.  He reports R leg pain with this.   From a cardiac standpoint, he is stable.  Today, he denies symptoms of palpitations, chest pain, shortness of breath, orthopnea, dizziness, presyncope, syncope, or neurologic sequela.  The patient feels that he is tolerating medications without difficulties and is otherwise without complaint today.   Past Medical History  Diagnosis Date  . Ischemic     tansient ischemic attack  . Dyslipidemia   . Sinus node dysfunction     Status post pacemaker implantation and generator replacement, atrial lead insulation defect noted at time of generator change by Dr Graciela Husbands 2008  . Single vessel coronary artery disease     severe, status post Taxus at right coronary artery in 2006 and preserved left ventricular  function  . Dyslipidemia     Drug-eluting stent to the right coronary artery November 2,2011, preserved LV function  . Pacemaker lead failure     RA lead insulation defect noted at Generator change by Dr Graciela Husbands 2008, impedance now low, but lead functioning otherwise normally  . Prostate cancer metastatic to multiple sites     s/p treatment, bone mets recently discovered   Past Surgical History  Procedure Date  . Pacemaker insertion     MDT    Current Outpatient Prescriptions  Medication Sig Dispense Refill  . aspirin 81 MG tablet Take 1 tablet (81 mg total) by mouth daily.      Marland Kitchen atenolol (TENORMIN) 50 MG tablet Take 50 mg by mouth daily. Take 1/4 tablet every day       . clonazePAM (KLONOPIN) 1 MG tablet Take 1 mg by mouth at bedtime.       . clopidogrel (PLAVIX) 75 MG tablet TAKE 1 TABLET BY MOUTH DAILY  30 tablet  1  . leuprolide (LUPRON) 11.25 MG injection Inject 11.25 mg into the muscle  every 3 (three) months. Next shot in January       . nitroGLYCERIN (NITROSTAT) 0.4 MG SL tablet Place 0.4 mg under the tongue every 5 (five) minutes as needed.        Marland Kitchen oxyCODONE-acetaminophen (PERCOCET) 10-325 MG per tablet Take 1 tablet by mouth every 4 (four) hours as needed.      . pantoprazole (PROTONIX) 40 MG tablet Take 40 mg by mouth daily.      . sertraline (ZOLOFT) 100 MG tablet Take 100 mg by mouth daily.        . simvastatin (ZOCOR) 40 MG tablet Take 40 mg by mouth at bedtime.        . vitamin B-12 (CYANOCOBALAMIN) 1000 MCG tablet Take 1,000 mcg by mouth daily.          Allergies  Allergen Reactions  . Altace   . Penicillins   . Phenazopyridine Hcl     History   Social History  . Marital Status: Married    Spouse Name: N/A    Number of Children: N/A  . Years of Education: N/A   Occupational History  . Not on file.   Social History Main Topics  . Smoking status: Former Smoker -- 1.0 packs/day for 5 years    Types: Cigarettes    Quit date: 06/14/1965  . Smokeless tobacco: Former Neurosurgeon  Types: Dorna Bloom    Quit date: 06/14/1978   Comment: chewed tobacco 5 years 1PPD  . Alcohol Use: No  . Drug Use: No  . Sexually Active: Not on file   Other Topics Concern  . Not on file   Social History Narrative  . No narrative on file    Physical Exam: Filed Vitals:   06/23/11 0942  BP: 100/64  Pulse: 61  Height: 5\' 10"  (1.778 m)  Weight: 152 lb (68.947 kg)  SpO2: 96%    GEN- The patient is well appearing, alert and oriented x 3 today.   Head- normocephalic, atraumatic Eyes-  Sclera clear, conjunctiva pink Ears- hearing intact Oropharynx- clear Neck- supple, no JVP Lymph- no cervical lymphadenopathy Lungs- Clear to ausculation bilaterally, normal work of breathing Chest- pacemaker pocket is well healed Heart- Regular rate and rhythm, no murmurs, rubs or gallops, PMI not laterally displaced GI- soft, NT, ND, + BS Extremities- no clubbing, cyanosis, or  edema  Pacemaker interrogation- reviewed in detail today,  See [facility] report  Assessment and Plan:

## 2011-06-23 NOTE — Assessment & Plan Note (Signed)
As above.

## 2011-07-01 ENCOUNTER — Telehealth: Payer: Self-pay | Admitting: Oncology

## 2011-07-01 ENCOUNTER — Ambulatory Visit (HOSPITAL_BASED_OUTPATIENT_CLINIC_OR_DEPARTMENT_OTHER): Payer: Medicare Other | Admitting: Oncology

## 2011-07-01 VITALS — BP 124/75 | HR 96 | Temp 98.0°F | Ht 68.0 in | Wt 153.2 lb

## 2011-07-01 DIAGNOSIS — G459 Transient cerebral ischemic attack, unspecified: Secondary | ICD-10-CM

## 2011-07-01 DIAGNOSIS — E785 Hyperlipidemia, unspecified: Secondary | ICD-10-CM

## 2011-07-01 DIAGNOSIS — I495 Sick sinus syndrome: Secondary | ICD-10-CM

## 2011-07-01 DIAGNOSIS — C61 Malignant neoplasm of prostate: Secondary | ICD-10-CM

## 2011-07-01 DIAGNOSIS — Z950 Presence of cardiac pacemaker: Secondary | ICD-10-CM

## 2011-07-01 DIAGNOSIS — T82110A Breakdown (mechanical) of cardiac electrode, initial encounter: Secondary | ICD-10-CM

## 2011-07-01 DIAGNOSIS — I259 Chronic ischemic heart disease, unspecified: Secondary | ICD-10-CM

## 2011-07-01 DIAGNOSIS — C7952 Secondary malignant neoplasm of bone marrow: Secondary | ICD-10-CM

## 2011-07-01 DIAGNOSIS — C7951 Secondary malignant neoplasm of bone: Secondary | ICD-10-CM | POA: Diagnosis not present

## 2011-07-01 DIAGNOSIS — I951 Orthostatic hypotension: Secondary | ICD-10-CM

## 2011-07-01 MED ORDER — LEUPROLIDE ACETATE (3 MONTH) 22.5 MG IM KIT
22.5000 mg | PACK | Freq: Once | INTRAMUSCULAR | Status: AC
  Administered 2011-07-01: 22.5 mg via INTRAMUSCULAR
  Filled 2011-07-01: qty 22.5

## 2011-07-01 NOTE — Progress Notes (Signed)
CC:   Russell Sprung, MD  REASON FOR CONSULTATION:  Prostate cancer.  HISTORY OF PRESENT ILLNESS:  Russell Browning is a very pleasant gentleman, currently of [place], [place].  He is a native of [place], but has lived the majority of his life around [place] at that time.  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  He is a gentleman with a history of prostate cancer dating back to 2001.  The exact details of his diagnosis are currently unavailable to me.  He tells me that his disease was outside the prostate.  He received radiation therapy and 2 years of Lupron, but it presumably was locally advanced prostate cancer.  Apparently his PSA was under control from 2001 up until the early parts of 2012, where his PSA rose up to 16.9.  At that time, he was under the care of Dr. Baldo Ash, a local urologist and he was staged with a bone scan, which showed he had bony metastases to the right hip and right femur.  He subsequently started on Lupron every 3 months and the last Lupron injection was on 11/15.  His PSA dropped down to close to 0.  His last PSA I have on him from December was 0.06.  However, apparently his urologist has left the practice and he does not really have any further urological or oncological followup.  The patient presented to [facility] with right hip pain in December 2012.  Imaging study did not really show any pathological fractures.  He was set up to see Dr. Retta Diones, however, he lost that appointment card or reminder and really failed to show up.  He actually reports that he was referred to the [facility], although he showed today without really any formal appointment and we were able to see him as a walk-in.  Clinically Russell Browning feels relatively fair.  He does report some occasional pain in his right hip. He is uses a cane for mobility.  He takes Percocet, which has helped his pain.  He does not report any  neurological symptoms.  Is not reporting any loss of bowel or bladder.  Has not had any other constitutional symptoms.  REVIEW OF SYSTEMS:  He does not report any headaches, blurry vision, double vision.  Does not report any motor or sensory neuropathy, alteration in mental status, psychiatric issues, depression.  Does not report any fever, chills, sweats.  Does not report any cough, hemoptysis, hematemesis.  No nausea or vomiting.  No abdominal pain, hematochezia, melena, or genitourinary complaints.  Rest of review of system is unremarkable.  PAST MEDICAL HISTORY:  Significant for coronary artery disease, hyperlipidemia, history of pacemaker failure.  He has history of prostate cancer.  He has also a pacemaker insertion history.  MEDICATIONS:  He is on aspirin, atenolol, Klonopin, Plavix, Lupron, nitroglycerin, Percocet, Protonix, Zoloft, Zocor, and vitamin B12.  ALLERGIES:  To Altace, penicillin, and Pyridium.  SOCIAL HISTORY:  He is married.  He has a number of children.  Denied any alcohol or tobacco abuse.  FAMILY HISTORY:  Unremarkable for any cancer history of note.  PHYSICAL EXAMINATION:  General:  Alert, awake gentleman, appeared in no active distress.  Vital Signs:  His blood pressure is 124/75, pulse 96, respirations 20, temperature is 98.  HEENT:  Head is normocephalic, atraumatic.  Pupils equal, round, reactive to light.  Oral mucosa moist and pink.  Neck:  Supple.  No adenopathy.  Heart:  Regular rate, S1  and S2.  Lungs:  Clear to auscultation.  Abdomen:  Soft, nontender.  No hepatosplenomegaly.  Extremities:  No clubbing, cyanosis, or edema. Neurologically:  Intact motor, sensory, and deep tendon reflexes.  LABORATORY DATA:  As mentioned, his PSA on June 02, 2011, was 0.06. His hemoglobin was 12, white cell count 5.3, platelet count of 125. Chemistry showed a creatinine of 1.03 and a GFR of 72.  ASSESSMENT AND PLAN:  This is a pleasant 71 year old gentleman  with what appears to be advanced prostate cancer.  He has metastatic disease to the bone.  It appears to be hormone sensitive at this time.  The diagnosis was initially in 2001, unclear stage or Gleason score or PSA at this time.  In terms of management standpoint, I will start by obtaining records from his urologist just to get an idea of what is the nature of prostate cancer we are dealing with and after that, I do believe that continuing androgen deprivation seems to have helped his PSA tremendously and I will continue him on Lupron 22.5 mg every 3 months.  In terms of his bony pain, I would like to refer him to Radiation Oncology for possible palliative radiation therapy.  Probably he will need a repeat bone scan at that time.  I will also defer using bisphosphonates until we get confirmation of his bony metastasis.  I will schedule him a quick followup in about 3 months after I have obtained all his medical records.    ______________________________ Benjiman Core, M.D. FNS/MEDQ  D:  07/01/2011  T:  07/01/2011  Job:  [id number]

## 2011-07-01 NOTE — Telephone Encounter (Signed)
Pt to see Dr Basilio Cairo at [facility] 1/22 at 8:45,and to ret to see fs on 4/17.  Printed for pt  aom

## 2011-07-01 NOTE — Progress Notes (Signed)
Note dictated

## 2011-07-02 ENCOUNTER — Encounter: Payer: Self-pay | Admitting: *Deleted

## 2011-07-02 NOTE — Progress Notes (Unsigned)
Faxed O.V. Note from 07/01/11 to dondra holloway @ [facility] as proof of visit and lupron injection. Fax [phone]. Plan # [id number]. rec'd fax confirmation.

## 2011-07-05 ENCOUNTER — Telehealth: Payer: Self-pay | Admitting: *Deleted

## 2011-07-05 NOTE — Telephone Encounter (Signed)
Message on voicemail from Lafonda Mosses stating that pt has been referred to the center in [place] but she has not received any records. Returned call to Lafonda Mosses to get fax number. Pertinent information faxed to Lafonda Mosses at [phone]  Lafonda Mosses [phone] option 2

## 2011-07-06 DIAGNOSIS — Z79899 Other long term (current) drug therapy: Secondary | ICD-10-CM | POA: Diagnosis not present

## 2011-07-06 DIAGNOSIS — C7951 Secondary malignant neoplasm of bone: Secondary | ICD-10-CM | POA: Diagnosis not present

## 2011-07-06 DIAGNOSIS — F411 Generalized anxiety disorder: Secondary | ICD-10-CM | POA: Diagnosis not present

## 2011-07-06 DIAGNOSIS — Z8673 Personal history of transient ischemic attack (TIA), and cerebral infarction without residual deficits: Secondary | ICD-10-CM | POA: Diagnosis not present

## 2011-07-06 DIAGNOSIS — Z801 Family history of malignant neoplasm of trachea, bronchus and lung: Secondary | ICD-10-CM | POA: Diagnosis not present

## 2011-07-06 DIAGNOSIS — Z7982 Long term (current) use of aspirin: Secondary | ICD-10-CM | POA: Diagnosis not present

## 2011-07-06 DIAGNOSIS — E7800 Pure hypercholesterolemia, unspecified: Secondary | ICD-10-CM | POA: Diagnosis not present

## 2011-07-06 DIAGNOSIS — Z510 Encounter for antineoplastic radiation therapy: Secondary | ICD-10-CM | POA: Diagnosis not present

## 2011-07-06 DIAGNOSIS — C61 Malignant neoplasm of prostate: Secondary | ICD-10-CM | POA: Diagnosis not present

## 2011-07-06 DIAGNOSIS — F329 Major depressive disorder, single episode, unspecified: Secondary | ICD-10-CM | POA: Diagnosis not present

## 2011-07-06 DIAGNOSIS — Z9861 Coronary angioplasty status: Secondary | ICD-10-CM | POA: Diagnosis not present

## 2011-07-06 DIAGNOSIS — I252 Old myocardial infarction: Secondary | ICD-10-CM | POA: Diagnosis not present

## 2011-07-09 DIAGNOSIS — C7951 Secondary malignant neoplasm of bone: Secondary | ICD-10-CM | POA: Diagnosis not present

## 2011-07-09 DIAGNOSIS — F411 Generalized anxiety disorder: Secondary | ICD-10-CM | POA: Diagnosis not present

## 2011-07-09 DIAGNOSIS — C7952 Secondary malignant neoplasm of bone marrow: Secondary | ICD-10-CM | POA: Diagnosis not present

## 2011-07-09 DIAGNOSIS — E7800 Pure hypercholesterolemia, unspecified: Secondary | ICD-10-CM | POA: Diagnosis not present

## 2011-07-09 DIAGNOSIS — C61 Malignant neoplasm of prostate: Secondary | ICD-10-CM | POA: Diagnosis not present

## 2011-07-09 DIAGNOSIS — F329 Major depressive disorder, single episode, unspecified: Secondary | ICD-10-CM | POA: Diagnosis not present

## 2011-07-09 DIAGNOSIS — Z510 Encounter for antineoplastic radiation therapy: Secondary | ICD-10-CM | POA: Diagnosis not present

## 2011-07-14 DIAGNOSIS — Z510 Encounter for antineoplastic radiation therapy: Secondary | ICD-10-CM | POA: Diagnosis not present

## 2011-07-14 DIAGNOSIS — C61 Malignant neoplasm of prostate: Secondary | ICD-10-CM | POA: Diagnosis not present

## 2011-07-14 DIAGNOSIS — F411 Generalized anxiety disorder: Secondary | ICD-10-CM | POA: Diagnosis not present

## 2011-07-14 DIAGNOSIS — C7951 Secondary malignant neoplasm of bone: Secondary | ICD-10-CM | POA: Diagnosis not present

## 2011-07-14 DIAGNOSIS — C7952 Secondary malignant neoplasm of bone marrow: Secondary | ICD-10-CM | POA: Diagnosis not present

## 2011-07-14 DIAGNOSIS — F329 Major depressive disorder, single episode, unspecified: Secondary | ICD-10-CM | POA: Diagnosis not present

## 2011-07-14 DIAGNOSIS — E7800 Pure hypercholesterolemia, unspecified: Secondary | ICD-10-CM | POA: Diagnosis not present

## 2011-07-15 ENCOUNTER — Telehealth: Payer: Self-pay | Admitting: Internal Medicine

## 2011-07-15 ENCOUNTER — Telehealth: Payer: Self-pay | Admitting: Cardiovascular Disease

## 2011-07-15 DIAGNOSIS — C7952 Secondary malignant neoplasm of bone marrow: Secondary | ICD-10-CM | POA: Diagnosis not present

## 2011-07-15 DIAGNOSIS — C7951 Secondary malignant neoplasm of bone: Secondary | ICD-10-CM | POA: Diagnosis not present

## 2011-07-15 DIAGNOSIS — F329 Major depressive disorder, single episode, unspecified: Secondary | ICD-10-CM | POA: Diagnosis not present

## 2011-07-15 DIAGNOSIS — F411 Generalized anxiety disorder: Secondary | ICD-10-CM | POA: Diagnosis not present

## 2011-07-15 DIAGNOSIS — E7800 Pure hypercholesterolemia, unspecified: Secondary | ICD-10-CM | POA: Diagnosis not present

## 2011-07-15 DIAGNOSIS — C61 Malignant neoplasm of prostate: Secondary | ICD-10-CM | POA: Diagnosis not present

## 2011-07-15 DIAGNOSIS — Z510 Encounter for antineoplastic radiation therapy: Secondary | ICD-10-CM | POA: Diagnosis not present

## 2011-07-15 NOTE — Telephone Encounter (Signed)
New Problem   [facility]) [phone] option # 2  Patient has an appnt today 07/15/11 @ 2:11pm     (URGENT)  Patient need care, unable to treat without pacer clearance from Dr. Clifton James, please return call ASAP

## 2011-07-15 NOTE — Telephone Encounter (Signed)
Note was provided per Dr. Johney Frame and the device clinic.

## 2011-07-15 NOTE — Telephone Encounter (Signed)
Lafonda Mosses called from [facility] called needing to get a pacer clearance before patient has radiation therapy today ay 2:30 pm.Fax to [phone].

## 2011-07-16 DIAGNOSIS — C61 Malignant neoplasm of prostate: Secondary | ICD-10-CM | POA: Diagnosis not present

## 2011-07-16 DIAGNOSIS — C7952 Secondary malignant neoplasm of bone marrow: Secondary | ICD-10-CM | POA: Diagnosis not present

## 2011-07-16 DIAGNOSIS — Z510 Encounter for antineoplastic radiation therapy: Secondary | ICD-10-CM | POA: Diagnosis not present

## 2011-07-19 DIAGNOSIS — C7951 Secondary malignant neoplasm of bone: Secondary | ICD-10-CM | POA: Diagnosis not present

## 2011-07-19 DIAGNOSIS — C61 Malignant neoplasm of prostate: Secondary | ICD-10-CM | POA: Diagnosis not present

## 2011-07-19 DIAGNOSIS — Z510 Encounter for antineoplastic radiation therapy: Secondary | ICD-10-CM | POA: Diagnosis not present

## 2011-07-20 DIAGNOSIS — Z510 Encounter for antineoplastic radiation therapy: Secondary | ICD-10-CM | POA: Diagnosis not present

## 2011-07-20 DIAGNOSIS — C61 Malignant neoplasm of prostate: Secondary | ICD-10-CM | POA: Diagnosis not present

## 2011-07-20 DIAGNOSIS — C7951 Secondary malignant neoplasm of bone: Secondary | ICD-10-CM | POA: Diagnosis not present

## 2011-07-21 DIAGNOSIS — Z510 Encounter for antineoplastic radiation therapy: Secondary | ICD-10-CM | POA: Diagnosis not present

## 2011-07-21 DIAGNOSIS — E782 Mixed hyperlipidemia: Secondary | ICD-10-CM | POA: Diagnosis not present

## 2011-07-21 DIAGNOSIS — I10 Essential (primary) hypertension: Secondary | ICD-10-CM | POA: Diagnosis not present

## 2011-07-21 DIAGNOSIS — C61 Malignant neoplasm of prostate: Secondary | ICD-10-CM | POA: Diagnosis not present

## 2011-07-21 DIAGNOSIS — C7951 Secondary malignant neoplasm of bone: Secondary | ICD-10-CM | POA: Diagnosis not present

## 2011-07-22 DIAGNOSIS — Z510 Encounter for antineoplastic radiation therapy: Secondary | ICD-10-CM | POA: Diagnosis not present

## 2011-07-22 DIAGNOSIS — C61 Malignant neoplasm of prostate: Secondary | ICD-10-CM | POA: Diagnosis not present

## 2011-07-22 DIAGNOSIS — C7951 Secondary malignant neoplasm of bone: Secondary | ICD-10-CM | POA: Diagnosis not present

## 2011-07-22 DIAGNOSIS — C7952 Secondary malignant neoplasm of bone marrow: Secondary | ICD-10-CM | POA: Diagnosis not present

## 2011-07-23 DIAGNOSIS — C61 Malignant neoplasm of prostate: Secondary | ICD-10-CM | POA: Diagnosis not present

## 2011-07-23 DIAGNOSIS — Z510 Encounter for antineoplastic radiation therapy: Secondary | ICD-10-CM | POA: Diagnosis not present

## 2011-07-23 DIAGNOSIS — C7952 Secondary malignant neoplasm of bone marrow: Secondary | ICD-10-CM | POA: Diagnosis not present

## 2011-07-26 DIAGNOSIS — Z510 Encounter for antineoplastic radiation therapy: Secondary | ICD-10-CM | POA: Diagnosis not present

## 2011-07-26 DIAGNOSIS — C7952 Secondary malignant neoplasm of bone marrow: Secondary | ICD-10-CM | POA: Diagnosis not present

## 2011-07-26 DIAGNOSIS — C61 Malignant neoplasm of prostate: Secondary | ICD-10-CM | POA: Diagnosis not present

## 2011-07-27 DIAGNOSIS — Z510 Encounter for antineoplastic radiation therapy: Secondary | ICD-10-CM | POA: Diagnosis not present

## 2011-07-27 DIAGNOSIS — C61 Malignant neoplasm of prostate: Secondary | ICD-10-CM | POA: Diagnosis not present

## 2011-07-27 DIAGNOSIS — C7952 Secondary malignant neoplasm of bone marrow: Secondary | ICD-10-CM | POA: Diagnosis not present

## 2011-07-28 DIAGNOSIS — C61 Malignant neoplasm of prostate: Secondary | ICD-10-CM | POA: Diagnosis not present

## 2011-07-28 DIAGNOSIS — C7951 Secondary malignant neoplasm of bone: Secondary | ICD-10-CM | POA: Diagnosis not present

## 2011-07-28 DIAGNOSIS — Z510 Encounter for antineoplastic radiation therapy: Secondary | ICD-10-CM | POA: Diagnosis not present

## 2011-07-29 DIAGNOSIS — Z510 Encounter for antineoplastic radiation therapy: Secondary | ICD-10-CM | POA: Diagnosis not present

## 2011-07-29 DIAGNOSIS — C7951 Secondary malignant neoplasm of bone: Secondary | ICD-10-CM | POA: Diagnosis not present

## 2011-07-29 DIAGNOSIS — C61 Malignant neoplasm of prostate: Secondary | ICD-10-CM | POA: Diagnosis not present

## 2011-07-30 DIAGNOSIS — G459 Transient cerebral ischemic attack, unspecified: Secondary | ICD-10-CM | POA: Diagnosis not present

## 2011-07-30 DIAGNOSIS — Z510 Encounter for antineoplastic radiation therapy: Secondary | ICD-10-CM | POA: Diagnosis not present

## 2011-07-30 DIAGNOSIS — G2589 Other specified extrapyramidal and movement disorders: Secondary | ICD-10-CM | POA: Diagnosis not present

## 2011-07-30 DIAGNOSIS — N400 Enlarged prostate without lower urinary tract symptoms: Secondary | ICD-10-CM | POA: Diagnosis not present

## 2011-07-30 DIAGNOSIS — M1990 Unspecified osteoarthritis, unspecified site: Secondary | ICD-10-CM | POA: Diagnosis not present

## 2011-07-30 DIAGNOSIS — C7951 Secondary malignant neoplasm of bone: Secondary | ICD-10-CM | POA: Diagnosis not present

## 2011-07-30 DIAGNOSIS — E782 Mixed hyperlipidemia: Secondary | ICD-10-CM | POA: Diagnosis not present

## 2011-07-30 DIAGNOSIS — IMO0001 Reserved for inherently not codable concepts without codable children: Secondary | ICD-10-CM | POA: Diagnosis not present

## 2011-07-30 DIAGNOSIS — I10 Essential (primary) hypertension: Secondary | ICD-10-CM | POA: Diagnosis not present

## 2011-07-30 DIAGNOSIS — C61 Malignant neoplasm of prostate: Secondary | ICD-10-CM | POA: Diagnosis not present

## 2011-08-05 ENCOUNTER — Encounter: Payer: Self-pay | Admitting: Internal Medicine

## 2011-08-21 DIAGNOSIS — J0100 Acute maxillary sinusitis, unspecified: Secondary | ICD-10-CM | POA: Diagnosis not present

## 2011-08-23 DIAGNOSIS — J0100 Acute maxillary sinusitis, unspecified: Secondary | ICD-10-CM | POA: Diagnosis not present

## 2011-09-07 DIAGNOSIS — Z923 Personal history of irradiation: Secondary | ICD-10-CM | POA: Diagnosis not present

## 2011-09-07 DIAGNOSIS — C7951 Secondary malignant neoplasm of bone: Secondary | ICD-10-CM | POA: Diagnosis not present

## 2011-09-07 DIAGNOSIS — C61 Malignant neoplasm of prostate: Secondary | ICD-10-CM | POA: Diagnosis not present

## 2011-09-23 ENCOUNTER — Encounter: Payer: Medicare Other | Admitting: *Deleted

## 2011-09-28 ENCOUNTER — Encounter: Payer: Self-pay | Admitting: *Deleted

## 2011-09-29 ENCOUNTER — Ambulatory Visit (HOSPITAL_BASED_OUTPATIENT_CLINIC_OR_DEPARTMENT_OTHER): Payer: Medicare Other | Admitting: Oncology

## 2011-09-29 ENCOUNTER — Telehealth: Payer: Self-pay | Admitting: Oncology

## 2011-09-29 ENCOUNTER — Other Ambulatory Visit: Payer: Self-pay | Admitting: *Deleted

## 2011-09-29 ENCOUNTER — Other Ambulatory Visit (HOSPITAL_BASED_OUTPATIENT_CLINIC_OR_DEPARTMENT_OTHER): Payer: Medicare Other | Admitting: Lab

## 2011-09-29 VITALS — BP 110/68 | HR 20 | Temp 97.0°F | Ht 68.0 in | Wt 157.8 lb

## 2011-09-29 DIAGNOSIS — G459 Transient cerebral ischemic attack, unspecified: Secondary | ICD-10-CM

## 2011-09-29 DIAGNOSIS — C61 Malignant neoplasm of prostate: Secondary | ICD-10-CM

## 2011-09-29 DIAGNOSIS — C7951 Secondary malignant neoplasm of bone: Secondary | ICD-10-CM | POA: Diagnosis not present

## 2011-09-29 DIAGNOSIS — R52 Pain, unspecified: Secondary | ICD-10-CM

## 2011-09-29 DIAGNOSIS — Z950 Presence of cardiac pacemaker: Secondary | ICD-10-CM

## 2011-09-29 DIAGNOSIS — I951 Orthostatic hypotension: Secondary | ICD-10-CM

## 2011-09-29 DIAGNOSIS — E785 Hyperlipidemia, unspecified: Secondary | ICD-10-CM

## 2011-09-29 DIAGNOSIS — C7952 Secondary malignant neoplasm of bone marrow: Secondary | ICD-10-CM

## 2011-09-29 DIAGNOSIS — T82110A Breakdown (mechanical) of cardiac electrode, initial encounter: Secondary | ICD-10-CM

## 2011-09-29 DIAGNOSIS — I495 Sick sinus syndrome: Secondary | ICD-10-CM

## 2011-09-29 DIAGNOSIS — I259 Chronic ischemic heart disease, unspecified: Secondary | ICD-10-CM

## 2011-09-29 LAB — PSA: PSA: 0.03 ng/mL (ref <=4.00)

## 2011-09-29 LAB — COMPREHENSIVE METABOLIC PANEL
Albumin: 4.2 g/dL (ref 3.5–5.2)
BUN: 16 mg/dL (ref 6–23)
CO2: 28 mEq/L (ref 19–32)
Chloride: 105 mEq/L (ref 96–112)
Creatinine, Ser: 0.94 mg/dL (ref 0.50–1.35)
Glucose, Bld: 84 mg/dL (ref 70–99)
Total Protein: 6.4 g/dL (ref 6.0–8.3)

## 2011-09-29 LAB — CBC WITH DIFFERENTIAL/PLATELET
BASO%: 0.8 % (ref 0.0–2.0)
EOS%: 4.6 % (ref 0.0–7.0)
Eosinophils Absolute: 0.2 10*3/uL (ref 0.0–0.5)
MCH: 30.4 pg (ref 27.2–33.4)
MCHC: 33.8 g/dL (ref 32.0–36.0)
MCV: 90.1 fL (ref 79.3–98.0)
MONO%: 9.9 % (ref 0.0–14.0)
NEUT#: 2.8 10*3/uL (ref 1.5–6.5)
RBC: 4.01 10*6/uL — ABNORMAL LOW (ref 4.20–5.82)
RDW: 13.8 % (ref 11.0–14.6)
nRBC: 0 % (ref 0–0)

## 2011-09-29 MED ORDER — OXYCODONE HCL 5 MG PO TABS: 5.0000 mg | ORAL_TABLET | ORAL | Status: AC | PRN

## 2011-09-29 MED ORDER — LEUPROLIDE ACETATE (3 MONTH) 22.5 MG IM KIT
22.5000 mg | PACK | Freq: Once | INTRAMUSCULAR | Status: AC
  Administered 2011-09-29: 22.5 mg via INTRAMUSCULAR
  Filled 2011-09-29: qty 22.5

## 2011-09-29 NOTE — Telephone Encounter (Signed)
Gave pt appt for May 29th injection and in July 2013 lab/md and injection

## 2011-09-29 NOTE — Progress Notes (Signed)
Faxed proof of lupron injection 22.5 mg given today, to [facility] attn: dondra holloway  C/O wife,  Russell Browning [phone]   Plan # [id number]

## 2011-09-29 NOTE — Progress Notes (Signed)
Hematology and Oncology Follow Up Visit  Russell Browning [id number] January 02, 1941 71 y.o. 09/29/2011 9:30 AM    Principle Diagnosis: This is a pleasant 71 year old gentleman with what appears to be advanced prostate cancer. He has metastatic disease to the bone. It appears to be hormone sensitive at this time. His cancer was inially diagnosed in 2000.    Prior Therapy: He received radiation therapy and 2 years of Lupron, but it presumably was locally advanced prostate cancer.   PSA was under control from 2001 up until the early parts of 2012, where his PSA rose up to 16.9. At that time, he was under the care of Dr. 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 He subsequently started on  Lupron every 3 months and the last Lupron injection was on 11/15.  He is S/P radiation therapy directed to the right hip (30 gray in 12 fractions) concluded in 2/ 2013   Current therapy: lupron 22.5 mg every three months.   Interim History:  Russell Browning presents today for a follow up visit. He is a pleasant gentleman who I saw for the first time back in January of 2013 for the diagnosis of hormone sensitive advanced prostate cancer. He is currently on Lupron every 3 months with his last PSA in December of 2012 was 0.06. He was complaining off right hip pain, but after radiation therapy his pain is a lot better. It is still using medication at night time but otherwise his hip is a lot better. Able to ambulate without any major difficulty his continued to perform activities of daily living without any major decline. He does have some difficulty at nighttime with hip pain. But when he takes oxycodone his pain is much relieved.  Medications: I have reviewed the patient's current medications. Current outpatient prescriptions:polyethylene glycol (MIRALAX / GLYCOLAX) packet, Take 17 g by mouth daily., Disp: , Rfl: ;  aspirin 81 MG tablet,  Take 1 tablet (81 mg total) by mouth daily., Disp: , Rfl: ;  atenolol (TENORMIN) 50 MG tablet, Take 50 mg by mouth daily. Take 1/4 tablet every day , Disp: , Rfl: ;  clonazePAM (KLONOPIN) 1 MG tablet, Take 1 mg by mouth at bedtime. , Disp: , Rfl:  clopidogrel (PLAVIX) 75 MG tablet, TAKE 1 TABLET BY MOUTH DAILY, Disp: 30 tablet, Rfl: 1;  leuprolide (LUPRON) 11.25 MG injection, Inject 11.25 mg into the muscle every 3 (three) months. Next shot in January , Disp: , Rfl: ;  nitroGLYCERIN (NITROSTAT) 0.4 MG SL tablet, Place 0.4 mg under the tongue every 5 (five) minutes as needed.  , Disp: , Rfl:  oxyCODONE-acetaminophen (PERCOCET) 10-325 MG per tablet, Take 1 tablet by mouth every 4 (four) hours as needed., Disp: , Rfl: ;  pantoprazole (PROTONIX) 40 MG tablet, Take 40 mg by mouth daily., Disp: , Rfl: ;  sertraline (ZOLOFT) 100 MG tablet, Take 100 mg by mouth daily.  , Disp: , Rfl: ;  simvastatin (ZOCOR) 40 MG tablet, Take 40 mg by mouth at bedtime.  , Disp: , Rfl:  vitamin B-12 (CYANOCOBALAMIN) 1000 MCG tablet, Take 1,000 mcg by mouth daily.  , Disp: , Rfl:  Current facility-administered medications:leuprolide (LUPRON) injection 22.5 mg, 22.5 mg, Intramuscular, Once, Benjiman Core, MD  Allergies:  Allergies  Allergen Reactions  . Altace   . Penicillins   . Phenazopyridine Hcl     Past  Medical History, Surgical history, Social history, and Family History were reviewed and updated.  Review of Systems: Constitutional:  Negative for fever, chills, night sweats, anorexia, weight loss, pain. Cardiovascular: no chest pain or dyspnea on exertion Respiratory: negative Neurological: negative Dermatological: negative ENT: negative Skin: Negative. Gastrointestinal: negative Genito-Urinary: negative Hematological and Lymphatic: negative Breast: negative Musculoskeletal: negative Remaining ROS negative. Physical Exam: Blood pressure 110/68, pulse 20, temperature 97 F (36.1 C), temperature source Oral,  height 5\' 8"  (1.727 m), weight 157 lb 12.8 oz (71.578 kg). ECOG: 1 General appearance: alert Head: Normocephalic, without obvious abnormality, atraumatic Neck: no adenopathy, no carotid bruit, no JVD, supple, symmetrical, trachea midline and thyroid not enlarged, symmetric, no tenderness/mass/nodules Lymph nodes: Cervical, supraclavicular, and axillary nodes normal. Heart:regular rate and rhythm, S1, S2 normal, no murmur, click, rub or gallop Lung:chest clear, no wheezing, rales, normal symmetric air entry Abdomin: soft, non-tender, without masses or organomegaly EXT:no erythema, induration, or nodules   Lab Results: Lab Results  Component Value Date   WBC 4.8 09/29/2011   HGB 12.2* 09/29/2011   HCT 36.1* 09/29/2011   MCV 90.1 09/29/2011   PLT 159 09/29/2011     Chemistry      Component Value Date/Time   NA 141 06/01/2011 1633   K 4.5 06/01/2011 1633   CL 103 06/01/2011 1633   CO2 30 06/01/2011 1633   BUN 15 06/01/2011 1633   CREATININE 1.03 06/01/2011 1633      Component Value Date/Time   CALCIUM 9.8 06/01/2011 1633       Impression and Plan: 71 year old gentleman with the following issues:  1. Hormone sensitive prostate cancer he is currently on Lupron with advanced disease to the bone. His PSA is under excellent control. Will give Lupron today and repeat in 3 months.   2. Bone pain: under better control. I will give him Oxycodone rx today.   3. Bone directed therapy: risks and benefits of Xgeva dicussed today. We will proceed with the first injection  in 6 weeks. 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     [facility], MD 4/17/20139:30 AM

## 2011-10-13 ENCOUNTER — Encounter: Payer: Self-pay | Admitting: Oncology

## 2011-10-13 NOTE — Progress Notes (Signed)
Faxed lupron charges for date 09/29/11 to [facility] @ [phone] policy # [id number].

## 2011-11-03 ENCOUNTER — Other Ambulatory Visit: Payer: Self-pay | Admitting: Cardiology

## 2011-11-08 ENCOUNTER — Encounter: Payer: Self-pay | Admitting: *Deleted

## 2011-11-09 ENCOUNTER — Other Ambulatory Visit: Payer: Self-pay | Admitting: Pharmacist

## 2011-11-10 ENCOUNTER — Ambulatory Visit (HOSPITAL_BASED_OUTPATIENT_CLINIC_OR_DEPARTMENT_OTHER): Payer: Medicare Other

## 2011-11-10 VITALS — BP 97/62 | HR 62 | Temp 97.4°F

## 2011-11-10 DIAGNOSIS — I951 Orthostatic hypotension: Secondary | ICD-10-CM

## 2011-11-10 DIAGNOSIS — E785 Hyperlipidemia, unspecified: Secondary | ICD-10-CM

## 2011-11-10 DIAGNOSIS — C7951 Secondary malignant neoplasm of bone: Secondary | ICD-10-CM

## 2011-11-10 DIAGNOSIS — I259 Chronic ischemic heart disease, unspecified: Secondary | ICD-10-CM

## 2011-11-10 DIAGNOSIS — C7952 Secondary malignant neoplasm of bone marrow: Secondary | ICD-10-CM | POA: Diagnosis not present

## 2011-11-10 DIAGNOSIS — Z950 Presence of cardiac pacemaker: Secondary | ICD-10-CM

## 2011-11-10 DIAGNOSIS — I495 Sick sinus syndrome: Secondary | ICD-10-CM

## 2011-11-10 DIAGNOSIS — C61 Malignant neoplasm of prostate: Secondary | ICD-10-CM

## 2011-11-10 DIAGNOSIS — T82110A Breakdown (mechanical) of cardiac electrode, initial encounter: Secondary | ICD-10-CM

## 2011-11-10 DIAGNOSIS — G459 Transient cerebral ischemic attack, unspecified: Secondary | ICD-10-CM

## 2011-11-10 MED ORDER — DENOSUMAB 120 MG/1.7ML ~~LOC~~ SOLN
120.0000 mg | Freq: Once | SUBCUTANEOUS | Status: AC
  Administered 2011-11-10: 120 mg via SUBCUTANEOUS
  Filled 2011-11-10: qty 1.7

## 2011-11-10 NOTE — Progress Notes (Signed)
Discussed with patient about need to take OTC Calcium and vitamin D.  Pamphlet given to [facility] with reminder to take this.

## 2011-11-15 ENCOUNTER — Telehealth: Payer: Self-pay | Admitting: Internal Medicine

## 2011-11-15 NOTE — Telephone Encounter (Signed)
Transmission was not received, patient will resend.

## 2011-11-15 NOTE — Telephone Encounter (Signed)
Pt states he sent pacer transmission today. Just wants to be sure it was received. I spoke with Gunnar Fusi. She will check and call pt back.  I will forward to [facility].

## 2011-11-15 NOTE — Telephone Encounter (Signed)
Pt sent transmission again today, received letter that last one wasn't, wants to know if we can check to make sure it came through, pls call

## 2011-11-24 DIAGNOSIS — E782 Mixed hyperlipidemia: Secondary | ICD-10-CM | POA: Diagnosis not present

## 2011-11-24 DIAGNOSIS — I10 Essential (primary) hypertension: Secondary | ICD-10-CM | POA: Diagnosis not present

## 2011-11-30 ENCOUNTER — Encounter: Payer: Self-pay | Admitting: Cardiology

## 2011-11-30 ENCOUNTER — Ambulatory Visit (INDEPENDENT_AMBULATORY_CARE_PROVIDER_SITE_OTHER): Payer: Medicare Other | Admitting: Cardiology

## 2011-11-30 VITALS — BP 99/64 | HR 56 | Ht 70.5 in | Wt 156.0 lb

## 2011-11-30 DIAGNOSIS — C61 Malignant neoplasm of prostate: Secondary | ICD-10-CM

## 2011-11-30 DIAGNOSIS — Z950 Presence of cardiac pacemaker: Secondary | ICD-10-CM | POA: Diagnosis not present

## 2011-11-30 DIAGNOSIS — E785 Hyperlipidemia, unspecified: Secondary | ICD-10-CM | POA: Diagnosis not present

## 2011-11-30 DIAGNOSIS — I259 Chronic ischemic heart disease, unspecified: Secondary | ICD-10-CM | POA: Diagnosis not present

## 2011-11-30 DIAGNOSIS — I951 Orthostatic hypotension: Secondary | ICD-10-CM

## 2011-11-30 MED ORDER — NITROGLYCERIN 0.4 MG SL SUBL: 0.4000 mg | SUBLINGUAL_TABLET | SUBLINGUAL | Status: DC | PRN

## 2011-11-30 MED ORDER — OXYCODONE HCL 10 MG PO TABS: 10.0000 mg | ORAL_TABLET | ORAL | Status: DC | PRN

## 2011-11-30 NOTE — Progress Notes (Signed)
Russell Bottoms, MD, [facility] ABIM Board Certified in Adult Cardiovascular Medicine,Internal Medicine and Critical Care Medicine    CC: Followup patient with coronary artery disease  HPI:  The patient is a 71 year old male with history of prior coronary intervention with stent placement x2. The patient is also status post pacemaker implantation for sinus node dysfunction. He is under treatment with Lupron for metastatic prostate cancer. At times he feels very weak. His blood pressure remains marginal although is not on any significant hypertensive medication treatment. He states that he drinks a lot of water, but does not replenish himself with any electrolytes . The patient also complains of significant pain in his left lower extremity due to bony metastasis. From a cardiac standpoint however he stable. He denies any chest pain, shortness of breath orthopnea PND. He reports no palpitations.    PMH: reviewed and listed in Problem List in Electronic Records (and see below) Past Medical History  Diagnosis Date  . Ischemic     tansient ischemic attack  . Dyslipidemia   . Sinus node dysfunction     Status post pacemaker implantation and generator replacement, atrial lead insulation defect noted at time of generator change by Dr Graciela Husbands 2008  . Single vessel coronary artery disease     severe, status post Taxus at right coronary artery in 2006 and preserved left ventricular  function  . Dyslipidemia     Drug-eluting stent to the right coronary artery November 2,2011, preserved LV function  . Pacemaker lead failure     RA lead insulation defect noted at Generator change by Dr Graciela Husbands 2008, impedance now low, but lead functioning otherwise normally  . Prostate cancer metastatic to multiple sites     s/p treatment, bone mets recently discovered   Past Surgical History  Procedure Date  . Pacemaker insertion     MDT    Allergies/SH/FHX : available in Electronic Records for review  Allergies    Allergen Reactions  . Penicillins   . Phenazopyridine Hcl   . Ramipril    History   Social History  . Marital Status: Married    Spouse Name: N/A    Number of Children: N/A  . Years of Education: N/A   Occupational History  . Not on file.   Social History Main Topics  . Smoking status: Former Smoker -- 1.0 packs/day for 5 years    Types: Cigarettes    Quit date: 06/14/1965  . Smokeless tobacco: Former Neurosurgeon    Types: Chew    Quit date: 06/14/1978   Comment: chewed tobacco 5 years 1PPD  . Alcohol Use: No  . Drug Use: No  . Sexually Active: Not on file   Other Topics Concern  . Not on file   Social History Narrative  . No narrative on file   No family history on file.  Medications: Current Outpatient Prescriptions  Medication Sig Dispense Refill  . aspirin 81 MG tablet Take 1 tablet (81 mg total) by mouth daily.      Marland Kitchen atenolol (TENORMIN) 50 MG tablet Take 50 mg by mouth daily. Take 1/4 tablet every day       . Calcium Carbonate-Vitamin D (CALCIUM-VITAMIN D) 500-200 MG-UNIT per tablet Take 1 tablet by mouth daily.      . clonazePAM (KLONOPIN) 1 MG tablet Take 1 mg by mouth at bedtime.       . clopidogrel (PLAVIX) 75 MG tablet TAKE 1 TABLET BY MOUTH DAILY  30 tablet  1  .  leuprolide (LUPRON) 11.25 MG injection Inject 11.25 mg into the muscle every 3 (three) months. Next shot in January       . NITROSTAT 0.4 MG SL tablet DISSOLVE 1 TABLET UNDER TONGUE AS NEEDED FOR CHEST PAIN EVERY 5 MINUTES  25 each  3  . NON FORMULARY Inject into the muscle every 6 (six) weeks. [facility] injections.      Marland Kitchen oxyCODONE 10 MG TABS Take 1 tablet (10 mg total) by mouth every 4 (four) hours as needed.  60 tablet  0  . oxyCODONE-acetaminophen (PERCOCET) 10-325 MG per tablet Take 1 tablet by mouth every 4 (four) hours as needed.      . pantoprazole (PROTONIX) 40 MG tablet Take 40 mg by mouth daily.      . polyethylene glycol (MIRALAX / GLYCOLAX) packet Take 17 g by mouth daily.      .  sertraline (ZOLOFT) 100 MG tablet Take 100 mg by mouth daily.        . simvastatin (ZOCOR) 40 MG tablet Take 40 mg by mouth at bedtime.        . vitamin B-12 (CYANOCOBALAMIN) 1000 MCG tablet Take 1,000 mcg by mouth daily.          ROS: No nausea or vomiting. No fever or chills.No melena or hematochezia.No bleeding.No claudication  Physical Exam: BP 99/64  Pulse 56  Ht 5' 10.5" (1.791 m)  Wt 156 lb (70.761 kg)  BMI 22.07 kg/m2 General:pale-appearing white male but in no distress. Neck: Normal carotid upstroke no carotid bruits. No thyromegaly.  Lungs: Clear breath sounds bilaterally without wheezing Cardiac: Regular rate and rhythm with normal S1-S2 no pathological murmurs Vascular: No edema. Normal distal pulses Skin: Warm and dry Physcologic: Normal affect  12lead ECG: Not obtained Limited bedside ECHO:N/A No images are attached to the encounter.   Assessment and Plan  CARDIAC PACEMAKER IN SITU Stable followed by Dr. Johney Frame  CORONARY ARTERY DISEASE, S/P PTCA No recurrent chest pain. No indication for ischemia workup. The patient loss as well as nitroglycerin and we made a refill today.  ORTHOSTATIC HYPOTENSION Borderline blood pressure. Patient has occasional orthostatic symptoms. He reports drinking a lot of water but have asked him to eat  Gatorade2 OR water with electrolytes. The patient is not on any antihypertensive medications short of a very small dose of atenolol.  DYSLIPIDEMIA Continue statin drug therapy. Followup blood work with primary care physician  Prostate cancer The patient has bony metastases with significant pain in his left lower extremity. He states he OxyIR 5 mg without any relief. I've taken the liberty to increase his dose to 10 mg every 4 hours as needed and he can further increase it to 15 mg as needed. Low-dose MS Contin may need to be considered if the patient has frequent breakthrough pain.   Patient Active Problem List  Diagnosis  .  DYSLIPIDEMIA  . CORONARY ARTERY DISEASE, S/P PTCA  . SINOATRIAL NODE DYSFUNCTION  . TRANSIENT ISCHEMIC ATTACK  . ORTHOSTATIC HYPOTENSION  . CARDIAC PACEMAKER IN SITU  . Pacemaker lead failure  . Prostate cancer

## 2011-11-30 NOTE — Assessment & Plan Note (Signed)
Borderline blood pressure. Patient has occasional orthostatic symptoms. He reports drinking a lot of water but have asked him to eat  Gatorade2 OR water with electrolytes. The patient is not on any antihypertensive medications short of a very small dose of atenolol.

## 2011-11-30 NOTE — Assessment & Plan Note (Signed)
The patient has bony metastases with significant pain in his left lower extremity. He states he OxyIR 5 mg without any relief. I've taken the liberty to increase his dose to 10 mg every 4 hours as needed and he can further increase it to 15 mg as needed. Low-dose MS Contin may need to be considered if the patient has frequent breakthrough pain.

## 2011-11-30 NOTE — Assessment & Plan Note (Signed)
Continue statin drug therapy. Followup blood work with primary care physician

## 2011-11-30 NOTE — Assessment & Plan Note (Signed)
Stable followed by Dr. Johney Frame

## 2011-11-30 NOTE — Assessment & Plan Note (Signed)
No recurrent chest pain. No indication for ischemia workup. The patient loss as well as nitroglycerin and we made a refill today.

## 2011-11-30 NOTE — Patient Instructions (Addendum)
   Dose increased to 10mg  on OxyIR - discuss with oncologist for future refills.  G2 or water with electrolytes Your physician wants you to follow up in:  1 year.  You will receive a reminder letter in the mail one-two months in advance.  If you don't receive a letter, please call our office to schedule the follow up appointment

## 2011-12-01 DIAGNOSIS — G2589 Other specified extrapyramidal and movement disorders: Secondary | ICD-10-CM | POA: Diagnosis not present

## 2011-12-01 DIAGNOSIS — E782 Mixed hyperlipidemia: Secondary | ICD-10-CM | POA: Diagnosis not present

## 2011-12-01 DIAGNOSIS — M1990 Unspecified osteoarthritis, unspecified site: Secondary | ICD-10-CM | POA: Diagnosis not present

## 2011-12-01 DIAGNOSIS — G459 Transient cerebral ischemic attack, unspecified: Secondary | ICD-10-CM | POA: Diagnosis not present

## 2011-12-01 DIAGNOSIS — IMO0002 Reserved for concepts with insufficient information to code with codable children: Secondary | ICD-10-CM | POA: Diagnosis not present

## 2011-12-01 DIAGNOSIS — N400 Enlarged prostate without lower urinary tract symptoms: Secondary | ICD-10-CM | POA: Diagnosis not present

## 2011-12-01 DIAGNOSIS — C61 Malignant neoplasm of prostate: Secondary | ICD-10-CM | POA: Diagnosis not present

## 2011-12-29 ENCOUNTER — Ambulatory Visit (HOSPITAL_BASED_OUTPATIENT_CLINIC_OR_DEPARTMENT_OTHER): Payer: Medicare Other | Admitting: Oncology

## 2011-12-29 ENCOUNTER — Telehealth: Payer: Self-pay | Admitting: Oncology

## 2011-12-29 ENCOUNTER — Other Ambulatory Visit (HOSPITAL_BASED_OUTPATIENT_CLINIC_OR_DEPARTMENT_OTHER): Payer: Medicare Other | Admitting: Lab

## 2011-12-29 VITALS — BP 111/74 | HR 93 | Temp 97.8°F | Ht 70.5 in | Wt 155.9 lb

## 2011-12-29 DIAGNOSIS — E785 Hyperlipidemia, unspecified: Secondary | ICD-10-CM

## 2011-12-29 DIAGNOSIS — T82110A Breakdown (mechanical) of cardiac electrode, initial encounter: Secondary | ICD-10-CM

## 2011-12-29 DIAGNOSIS — G459 Transient cerebral ischemic attack, unspecified: Secondary | ICD-10-CM

## 2011-12-29 DIAGNOSIS — I951 Orthostatic hypotension: Secondary | ICD-10-CM

## 2011-12-29 DIAGNOSIS — Z950 Presence of cardiac pacemaker: Secondary | ICD-10-CM

## 2011-12-29 DIAGNOSIS — C61 Malignant neoplasm of prostate: Secondary | ICD-10-CM

## 2011-12-29 DIAGNOSIS — Z5111 Encounter for antineoplastic chemotherapy: Secondary | ICD-10-CM | POA: Diagnosis not present

## 2011-12-29 DIAGNOSIS — I495 Sick sinus syndrome: Secondary | ICD-10-CM

## 2011-12-29 DIAGNOSIS — C7951 Secondary malignant neoplasm of bone: Secondary | ICD-10-CM

## 2011-12-29 DIAGNOSIS — M25559 Pain in unspecified hip: Secondary | ICD-10-CM | POA: Diagnosis not present

## 2011-12-29 DIAGNOSIS — I259 Chronic ischemic heart disease, unspecified: Secondary | ICD-10-CM

## 2011-12-29 LAB — CBC WITH DIFFERENTIAL/PLATELET
BASO%: 0.4 % (ref 0.0–2.0)
EOS%: 3.2 % (ref 0.0–7.0)
HCT: 36.8 % — ABNORMAL LOW (ref 38.4–49.9)
LYMPH%: 29.9 % (ref 14.0–49.0)
MCH: 29.7 pg (ref 27.2–33.4)
MCHC: 33.9 g/dL (ref 32.0–36.0)
NEUT%: 57.8 % (ref 39.0–75.0)
Platelets: 151 10*3/uL (ref 140–400)
RBC: 4.2 10*6/uL (ref 4.20–5.82)
WBC: 5.4 10*3/uL (ref 4.0–10.3)

## 2011-12-29 LAB — COMPREHENSIVE METABOLIC PANEL
ALT: 16 U/L (ref 0–53)
AST: 17 U/L (ref 0–37)
Alkaline Phosphatase: 20 U/L — ABNORMAL LOW (ref 39–117)
Creatinine, Ser: 0.97 mg/dL (ref 0.50–1.35)
Sodium: 140 mEq/L (ref 135–145)
Total Bilirubin: 0.3 mg/dL (ref 0.3–1.2)
Total Protein: 6.6 g/dL (ref 6.0–8.3)

## 2011-12-29 MED ORDER — LEUPROLIDE ACETATE (3 MONTH) 22.5 MG IM KIT
22.5000 mg | PACK | Freq: Once | INTRAMUSCULAR | Status: AC
  Administered 2011-12-29: 22.5 mg via INTRAMUSCULAR
  Filled 2011-12-29: qty 22.5

## 2011-12-29 MED ORDER — DENOSUMAB 120 MG/1.7ML ~~LOC~~ SOLN
120.0000 mg | Freq: Once | SUBCUTANEOUS | Status: AC
  Administered 2011-12-29: 120 mg via SUBCUTANEOUS
  Filled 2011-12-29: qty 1.7

## 2011-12-29 NOTE — Telephone Encounter (Signed)
appts made and printed for pt aom °

## 2011-12-29 NOTE — Progress Notes (Signed)
Hematology and Oncology Follow Up Visit  Russell Browning [id number] [birth year] 71 y.o. 12/29/2011 9:06 AM    Principle Diagnosis: This is a pleasant 71 year old gentleman with  advanced prostate cancer. He has metastatic disease to the bone. It appears to be hormone sensitive at this time. His cancer was inially diagnosed in 2000.    Prior Therapy: 1. He received radiation therapy and 2 years of Lupron, but it presumably was locally advanced prostate cancer at the time of diagnosis in 2000.  2. PSA was under control from 2001 up until the early parts of 2012, where his PSA rose up to 16.9. At that time, he was under the care of Dr. 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 He subsequently started on  Lupron every 3 months.  3. He is S/P radiation therapy directed to the right hip (30 gray in 12 fractions) concluded in 07/2011 for palliative purposes conducted in [place].    Current therapy:  1. lupron 22.5 mg every three months.  2. Rivka Barbara given every 4-6 weeks started in 10/2011.   Interim History:  Russell Browning presents today for a follow up visit. He is a pleasant gentleman who I saw for the first time back in January of 2013 for the diagnosis of hormone sensitive advanced prostate cancer. He is currently on Lupron every 3 months with his last PSA  was 0.03. He was complaining off right hip pain, but after radiation therapy his pain is a lot better but needing pain medications at night time. Able to ambulate without any major difficulty his continued to perform activities of daily living without any major decline. He reports hot flashes associated with Lupron. No complications with his first Xgeva at this time.  Medications: I have reviewed the patient's current medications. Current outpatient prescriptions:atenolol (TENORMIN) 50 MG tablet, Take 50 mg by mouth daily. Take 1/4 tablet every day , Disp: , Rfl: ;   Calcium Carbonate-Vitamin D (CALCIUM-VITAMIN D) 500-200 MG-UNIT per tablet, Take 1 tablet by mouth daily., Disp: , Rfl: ;  clonazePAM (KLONOPIN) 1 MG tablet, Take 1 mg by mouth at bedtime. , Disp: , Rfl: ;  clopidogrel (PLAVIX) 75 MG tablet, TAKE 1 TABLET BY MOUTH DAILY, Disp: 30 tablet, Rfl: 1 leuprolide (LUPRON) 11.25 MG injection, Inject 11.25 mg into the muscle every 3 (three) months. Next shot in January , Disp: , Rfl: ;  nitroGLYCERIN (NITROSTAT) 0.4 MG SL tablet, Place 1 tablet (0.4 mg total) under the tongue every 5 (five) minutes as needed for chest pain., Disp: 25 tablet, Rfl: 3;  oxyCODONE-acetaminophen (PERCOCET) 10-325 MG per tablet, Take 1 tablet by mouth every 4 (four) hours as needed., Disp: , Rfl:  pantoprazole (PROTONIX) 40 MG tablet, Take 40 mg by mouth daily., Disp: , Rfl: ;  polyethylene glycol (MIRALAX / GLYCOLAX) packet, Take 17 g by mouth daily., Disp: , Rfl: ;  sertraline (ZOLOFT) 100 MG tablet, Take 100 mg by mouth daily.  , Disp: , Rfl: ;  simvastatin (ZOCOR) 40 MG tablet, Take 40 mg by mouth at bedtime.  , Disp: , Rfl: ;  vitamin B-12 (CYANOCOBALAMIN) 1000 MCG tablet, Take 1,000 mcg by mouth daily.  , Disp: , Rfl:  NON FORMULARY, Inject into the muscle every 6 (six) weeks. [facility] injections., Disp: , Rfl:  Current facility-administered medications:denosumab (XGEVA) injection 120 mg, 120 mg, Subcutaneous, Once, Benjiman Core, MD;  leuprolide (LUPRON) injection 22.5 mg, 22.5 mg, Intramuscular, Once, Benjiman Core, MD  Allergies:  Allergies  Allergen Reactions  . Penicillins   . Phenazopyridine Hcl   . Ramipril     Past Medical History, Surgical history, Social history, and Family History were reviewed and updated.  Review of Systems: Constitutional:  Negative for fever, chills, night sweats, anorexia, weight loss, pain. Cardiovascular: no chest pain or dyspnea on exertion Respiratory: negative Neurological: negative Dermatological: negative ENT: negative Skin:  Negative. Gastrointestinal: negative Genito-Urinary: negative Hematological and Lymphatic: negative Breast: negative Musculoskeletal: negative Remaining ROS negative. Physical Exam: Blood pressure 111/74, pulse 93, temperature 97.8 F (36.6 C), temperature source Oral, height 5' 10.5" (1.791 m), weight 155 lb 14.4 oz (70.716 kg). ECOG: 1 General appearance: alert Head: Normocephalic, without obvious abnormality, atraumatic Neck: no adenopathy, no carotid bruit, no JVD, supple, symmetrical, trachea midline and thyroid not enlarged, symmetric, no tenderness/mass/nodules Lymph nodes: Cervical, supraclavicular, and axillary nodes normal. Heart:regular rate and rhythm, S1, S2 normal, no murmur, click, rub or gallop Lung:chest clear, no wheezing, rales, normal symmetric air entry Abdomin: soft, non-tender, without masses or organomegaly EXT:no erythema, induration, or nodules   Lab Results: Lab Results  Component Value Date   WBC 5.4 12/29/2011   HGB 12.5* 12/29/2011   HCT 36.8* 12/29/2011   MCV 87.7 12/29/2011   PLT 151 12/29/2011  PSA 09/29/2011 0.03  6/12 labs from his PCP scanned:  Ca 9.1 Albumin 4.2  Impression and Plan: 71 year old gentleman with the following issues:  1. Hormone sensitive prostate cancer he is currently on Lupron with advanced disease to the bone. His PSA is under excellent control. Will give Lupron today and repeat in 3 months.   2. Bone pain: under better control. Still need pain medications at night time. He already got radiation to that area.   3. Bone directed therapy: Rivka Barbara was well tolerated. We will proceed with the njection  every 6 weeks. 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  Were discussed again today.    [facility], MD 7/17/20139:06 AM

## 2011-12-29 NOTE — Progress Notes (Signed)
Patient given script with names of both injections given today for his insurance company and was taken to [facility] in financial assistance for help with an ongoing claim with [employer]

## 2011-12-29 NOTE — Addendum Note (Signed)
Addended by: Reesa Chew on: 12/29/2011 09:41 AM   Modules accepted: Orders

## 2012-01-24 DIAGNOSIS — S8010XA Contusion of unspecified lower leg, initial encounter: Secondary | ICD-10-CM | POA: Diagnosis not present

## 2012-01-24 DIAGNOSIS — G2589 Other specified extrapyramidal and movement disorders: Secondary | ICD-10-CM | POA: Diagnosis not present

## 2012-01-24 DIAGNOSIS — C61 Malignant neoplasm of prostate: Secondary | ICD-10-CM | POA: Diagnosis not present

## 2012-02-08 ENCOUNTER — Encounter: Payer: Self-pay | Admitting: *Deleted

## 2012-02-09 ENCOUNTER — Other Ambulatory Visit (HOSPITAL_BASED_OUTPATIENT_CLINIC_OR_DEPARTMENT_OTHER): Payer: Medicare Other | Admitting: Lab

## 2012-02-09 ENCOUNTER — Ambulatory Visit (HOSPITAL_BASED_OUTPATIENT_CLINIC_OR_DEPARTMENT_OTHER): Payer: Medicare Other

## 2012-02-09 VITALS — BP 120/82 | HR 95 | Temp 97.2°F

## 2012-02-09 DIAGNOSIS — I951 Orthostatic hypotension: Secondary | ICD-10-CM

## 2012-02-09 DIAGNOSIS — Z950 Presence of cardiac pacemaker: Secondary | ICD-10-CM

## 2012-02-09 DIAGNOSIS — I495 Sick sinus syndrome: Secondary | ICD-10-CM

## 2012-02-09 DIAGNOSIS — G459 Transient cerebral ischemic attack, unspecified: Secondary | ICD-10-CM

## 2012-02-09 DIAGNOSIS — C7951 Secondary malignant neoplasm of bone: Secondary | ICD-10-CM

## 2012-02-09 DIAGNOSIS — E785 Hyperlipidemia, unspecified: Secondary | ICD-10-CM

## 2012-02-09 DIAGNOSIS — I259 Chronic ischemic heart disease, unspecified: Secondary | ICD-10-CM

## 2012-02-09 DIAGNOSIS — C61 Malignant neoplasm of prostate: Secondary | ICD-10-CM | POA: Diagnosis not present

## 2012-02-09 DIAGNOSIS — T82110A Breakdown (mechanical) of cardiac electrode, initial encounter: Secondary | ICD-10-CM

## 2012-02-09 DIAGNOSIS — C7952 Secondary malignant neoplasm of bone marrow: Secondary | ICD-10-CM | POA: Diagnosis not present

## 2012-02-09 LAB — COMPREHENSIVE METABOLIC PANEL (CC13)
BUN: 13 mg/dL (ref 7.0–26.0)
CO2: 28 mEq/L (ref 22–29)
Calcium: 9.3 mg/dL (ref 8.4–10.4)
Chloride: 103 mEq/L (ref 98–107)
Creatinine: 1 mg/dL (ref 0.7–1.3)
Glucose: 92 mg/dl (ref 70–99)

## 2012-02-09 MED ORDER — DENOSUMAB 120 MG/1.7ML ~~LOC~~ SOLN
120.0000 mg | Freq: Once | SUBCUTANEOUS | Status: AC
  Administered 2012-02-09: 120 mg via SUBCUTANEOUS
  Filled 2012-02-09: qty 1.7

## 2012-02-09 NOTE — Addendum Note (Signed)
Addended by: Konrad Penta on: 02/09/2012 09:15 AM   Modules accepted: Orders

## 2012-03-30 ENCOUNTER — Telehealth: Payer: Self-pay | Admitting: Oncology

## 2012-03-30 ENCOUNTER — Other Ambulatory Visit (HOSPITAL_BASED_OUTPATIENT_CLINIC_OR_DEPARTMENT_OTHER): Payer: Medicare Other | Admitting: Lab

## 2012-03-30 ENCOUNTER — Ambulatory Visit (HOSPITAL_BASED_OUTPATIENT_CLINIC_OR_DEPARTMENT_OTHER): Payer: Medicare Other | Admitting: Oncology

## 2012-03-30 VITALS — BP 100/69 | HR 83 | Temp 97.8°F | Resp 20 | Ht 70.5 in | Wt 148.5 lb

## 2012-03-30 DIAGNOSIS — G459 Transient cerebral ischemic attack, unspecified: Secondary | ICD-10-CM

## 2012-03-30 DIAGNOSIS — Z5111 Encounter for antineoplastic chemotherapy: Secondary | ICD-10-CM

## 2012-03-30 DIAGNOSIS — E785 Hyperlipidemia, unspecified: Secondary | ICD-10-CM

## 2012-03-30 DIAGNOSIS — T82110A Breakdown (mechanical) of cardiac electrode, initial encounter: Secondary | ICD-10-CM

## 2012-03-30 DIAGNOSIS — I259 Chronic ischemic heart disease, unspecified: Secondary | ICD-10-CM | POA: Diagnosis not present

## 2012-03-30 DIAGNOSIS — I495 Sick sinus syndrome: Secondary | ICD-10-CM

## 2012-03-30 DIAGNOSIS — Z950 Presence of cardiac pacemaker: Secondary | ICD-10-CM | POA: Diagnosis not present

## 2012-03-30 DIAGNOSIS — C7952 Secondary malignant neoplasm of bone marrow: Secondary | ICD-10-CM | POA: Diagnosis not present

## 2012-03-30 DIAGNOSIS — C61 Malignant neoplasm of prostate: Secondary | ICD-10-CM

## 2012-03-30 DIAGNOSIS — I951 Orthostatic hypotension: Secondary | ICD-10-CM

## 2012-03-30 DIAGNOSIS — C7951 Secondary malignant neoplasm of bone: Secondary | ICD-10-CM

## 2012-03-30 LAB — COMPREHENSIVE METABOLIC PANEL (CC13)
ALT: 15 U/L (ref 0–55)
Albumin: 4.1 g/dL (ref 3.5–5.0)
Alkaline Phosphatase: 19 U/L — ABNORMAL LOW (ref 40–150)
Glucose: 77 mg/dl (ref 70–99)
Potassium: 3.8 mEq/L (ref 3.5–5.1)
Sodium: 140 mEq/L (ref 136–145)
Total Bilirubin: 0.4 mg/dL (ref 0.20–1.20)
Total Protein: 6.5 g/dL (ref 6.4–8.3)

## 2012-03-30 LAB — CBC WITH DIFFERENTIAL/PLATELET
BASO%: 0.4 % (ref 0.0–2.0)
Basophils Absolute: 0 10*3/uL (ref 0.0–0.1)
HCT: 36 % — ABNORMAL LOW (ref 38.4–49.9)
HGB: 12.3 g/dL — ABNORMAL LOW (ref 13.0–17.1)
MONO#: 0.5 10*3/uL (ref 0.1–0.9)
NEUT%: 58 % (ref 39.0–75.0)
RDW: 14 % (ref 11.0–14.6)
WBC: 5.3 10*3/uL (ref 4.0–10.3)
lymph#: 1.6 10*3/uL (ref 0.9–3.3)

## 2012-03-30 LAB — PSA: PSA: 0.04 ng/mL (ref <=4.00)

## 2012-03-30 MED ORDER — LEUPROLIDE ACETATE (3 MONTH) 22.5 MG IM KIT
22.5000 mg | PACK | Freq: Once | INTRAMUSCULAR | Status: AC
  Administered 2012-03-30: 22.5 mg via INTRAMUSCULAR
  Filled 2012-03-30: qty 22.5

## 2012-03-30 MED ORDER — DENOSUMAB 120 MG/1.7ML ~~LOC~~ SOLN
120.0000 mg | Freq: Once | SUBCUTANEOUS | Status: AC
  Administered 2012-03-30: 120 mg via SUBCUTANEOUS
  Filled 2012-03-30: qty 1.7

## 2012-03-30 NOTE — Progress Notes (Signed)
Hematology and Oncology Follow Up Visit  Dracen Reigle [id number] 1941/04/21 71 y.o. 03/30/2012 10:08 AM    Principle Diagnosis: This is a pleasant 71 year old gentleman with  advanced prostate cancer. He has metastatic disease to the bone. It appears to be hormone sensitive at this time. His cancer was inially diagnosed in 2000.    Prior Therapy: 1. He received radiation therapy and 2 years of Lupron, but it presumably was locally advanced prostate cancer at the time of diagnosis in 2000.  2. PSA was under control from 2001 up until the early parts of 2012, where his PSA rose up to 16.9. At that time, he was under the care of Dr. 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 He subsequently started on  Lupron every 3 months.  3. He is S/P radiation therapy directed to the right hip (30 gray in 12 fractions) concluded in 07/2011 for palliative purposes conducted in [place].    Current therapy:  1. lupron 22.5 mg every three months.  2. Rivka Barbara given every 6 weeks started in 10/2011.   Interim History:  Mr. Spivack presents today for a follow up visit. He is a pleasant gentleman who I saw for the first time back in January of 2013 for the diagnosis of hormone sensitive advanced prostate cancer. He is currently on Lupron every 3 months with his last PSA  was 0.03. He was complaining off right hip pain, but after radiation therapy his pain is a lot better but needing pain medications at night time. Able to ambulate without any major difficulty his continued to perform activities of daily living without any major decline. He reports hot flashes associated with Lupron but these has improved with Effexor. No complications with Xgeva at this time.  Medications: I have reviewed the patient's current medications. Current outpatient prescriptions:atenolol (TENORMIN) 50 MG tablet, Take 50 mg by mouth daily. Take 1/4 tablet  every day , Disp: , Rfl: ;  Calcium Carbonate-Vitamin D (CALCIUM-VITAMIN D) 500-200 MG-UNIT per tablet, Take 1 tablet by mouth daily., Disp: , Rfl: ;  clonazePAM (KLONOPIN) 1 MG tablet, Take 1 mg by mouth at bedtime. , Disp: , Rfl: ;  clopidogrel (PLAVIX) 75 MG tablet, TAKE 1 TABLET BY MOUTH DAILY, Disp: 30 tablet, Rfl: 1 fentaNYL (DURAGESIC - DOSED MCG/HR) 25 MCG/HR, Place 1 patch onto the skin every 3 (three) days., Disp: , Rfl: ;  leuprolide (LUPRON) 22.5 MG injection, Inject 22.5 mg into the muscle every 3 (three) months., Disp: , Rfl: ;  nitroGLYCERIN (NITROSTAT) 0.4 MG SL tablet, Place 1 tablet (0.4 mg total) under the tongue every 5 (five) minutes as needed for chest pain., Disp: 25 tablet, Rfl: 3 NON FORMULARY, Inject into the muscle every 6 (six) weeks. [facility] injections., Disp: , Rfl: ;  oxyCODONE-acetaminophen (PERCOCET) 10-325 MG per tablet, Take 1 tablet by mouth 4 (four) times daily as needed. , Disp: , Rfl: ;  pantoprazole (PROTONIX) 40 MG tablet, Take 40 mg by mouth daily., Disp: , Rfl: ;  polyethylene glycol (MIRALAX / GLYCOLAX) packet, Take 17 g by mouth daily., Disp: , Rfl:  simvastatin (ZOCOR) 40 MG tablet, Take 40 mg by mouth at bedtime.  , Disp: , Rfl: ;  venlafaxine XR (EFFEXOR-XR) 150 MG 24 hr capsule, Take 150 mg by mouth daily., Disp: , Rfl: ;  vitamin B-12 (CYANOCOBALAMIN) 1000 MCG tablet, Take 1,000 mcg by  mouth daily.  , Disp: , Rfl:  Current facility-administered medications:denosumab (XGEVA) injection 120 mg, 120 mg, Subcutaneous, Once, Benjiman Core, MD;  leuprolide (LUPRON) injection 22.5 mg, 22.5 mg, Intramuscular, Once, Benjiman Core, MD  Allergies:  Allergies  Allergen Reactions  . Penicillins   . Phenazopyridine Hcl   . Ramipril     Past Medical History, Surgical history, Social history, and Family History were reviewed and updated.  Review of Systems: Constitutional:  Negative for fever, chills, night sweats, anorexia, weight loss, pain. Cardiovascular:  no chest pain or dyspnea on exertion Respiratory: negative Neurological: negative Dermatological: negative ENT: negative Skin: Negative. Gastrointestinal: negative Genito-Urinary: negative Hematological and Lymphatic: negative Breast: negative Musculoskeletal: negative Remaining ROS negative. Physical Exam: There were no vitals taken for this visit. ECOG: 1 General appearance: alert Head: Normocephalic, without obvious abnormality, atraumatic Neck: no adenopathy, no carotid bruit, no JVD, supple, symmetrical, trachea midline and thyroid not enlarged, symmetric, no tenderness/mass/nodules Lymph nodes: Cervical, supraclavicular, and axillary nodes normal. Heart:regular rate and rhythm, S1, S2 normal, no murmur, click, rub or gallop Lung:chest clear, no wheezing, rales, normal symmetric air entry Abdomin: soft, non-tender, without masses or organomegaly EXT:no erythema, induration, or nodules   Lab Results: Lab Results  Component Value Date   WBC 5.3 03/30/2012   HGB 12.3* 03/30/2012   HCT 36.0* 03/30/2012   MCV 90.1 03/30/2012   PLT 159 03/30/2012   Results for [employer] (MRN [id number]) as of 03/30/2012 10:09  Ref. Range 12/29/2011 08:27  PSA Latest Range: <=4.00 ng/mL 0.03   Impression and Plan: 70 year old gentleman with the following issues:  1. Hormone sensitive prostate cancer he is currently on Lupron with advanced disease to the bone. His PSA is under excellent control. Will give Lupron today and repeat in 3 months.   2. Bone pain: under better control. Still need little pain medications at night time. He already got radiation to that area.   3. Bone directed therapy: Rivka Barbara was well tolerated. We will proceed with the njection  every 6 weeks. 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   4. Follow up in 3 months.    Song Garris, MD 10/17/201310:08 AM

## 2012-03-30 NOTE — Telephone Encounter (Signed)
gv pt appt schedule for November 2013 and January 2014.

## 2012-04-04 DIAGNOSIS — E782 Mixed hyperlipidemia: Secondary | ICD-10-CM | POA: Diagnosis not present

## 2012-04-04 DIAGNOSIS — I10 Essential (primary) hypertension: Secondary | ICD-10-CM | POA: Diagnosis not present

## 2012-04-04 DIAGNOSIS — IMO0002 Reserved for concepts with insufficient information to code with codable children: Secondary | ICD-10-CM | POA: Diagnosis not present

## 2012-04-07 DIAGNOSIS — Z23 Encounter for immunization: Secondary | ICD-10-CM | POA: Diagnosis not present

## 2012-04-07 DIAGNOSIS — I10 Essential (primary) hypertension: Secondary | ICD-10-CM | POA: Diagnosis not present

## 2012-04-07 DIAGNOSIS — C61 Malignant neoplasm of prostate: Secondary | ICD-10-CM | POA: Diagnosis not present

## 2012-04-07 DIAGNOSIS — E782 Mixed hyperlipidemia: Secondary | ICD-10-CM | POA: Diagnosis not present

## 2012-04-07 DIAGNOSIS — G459 Transient cerebral ischemic attack, unspecified: Secondary | ICD-10-CM | POA: Diagnosis not present

## 2012-04-07 DIAGNOSIS — IMO0001 Reserved for inherently not codable concepts without codable children: Secondary | ICD-10-CM | POA: Diagnosis not present

## 2012-04-18 ENCOUNTER — Encounter: Payer: Self-pay | Admitting: *Deleted

## 2012-04-20 ENCOUNTER — Telehealth: Payer: Self-pay | Admitting: Internal Medicine

## 2012-04-20 NOTE — Telephone Encounter (Signed)
04-20-12 sent past due letter, certified, per kristen/mt °

## 2012-04-25 NOTE — Telephone Encounter (Signed)
Spoke w/pt in regards to transmission. Pt to send transmission and will call back later to make sure it was received.

## 2012-04-25 NOTE — Telephone Encounter (Signed)
New Problem:    Patient sent his last transmission but it was not received and would like to try sending another after conferring with someone here.  Please call back.

## 2012-04-26 ENCOUNTER — Ambulatory Visit (INDEPENDENT_AMBULATORY_CARE_PROVIDER_SITE_OTHER): Payer: Medicare Other | Admitting: *Deleted

## 2012-04-26 ENCOUNTER — Telehealth: Payer: Self-pay | Admitting: *Deleted

## 2012-04-26 ENCOUNTER — Encounter: Payer: Self-pay | Admitting: Internal Medicine

## 2012-04-26 DIAGNOSIS — I495 Sick sinus syndrome: Secondary | ICD-10-CM | POA: Diagnosis not present

## 2012-04-26 DIAGNOSIS — Z950 Presence of cardiac pacemaker: Secondary | ICD-10-CM

## 2012-04-26 NOTE — Telephone Encounter (Signed)
Spoke w/pt to let know transmission was received. Pt aware that letter will be sent once reviewed by DR.

## 2012-04-26 NOTE — Telephone Encounter (Signed)
Pt having trouble sending [facility] transmission. Pt to call tech services so they can help troubleshoot. Pt to call back when that happens.

## 2012-04-28 LAB — REMOTE PACEMAKER DEVICE
BATTERY VOLTAGE: 2.79 V
BRDY-0002RV: 50 {beats}/min
BRDY-0004RV: 150 {beats}/min
RV LEAD THRESHOLD: 1.5 V
VENTRICULAR PACING PM: 0

## 2012-05-04 ENCOUNTER — Encounter: Payer: Self-pay | Admitting: Oncology

## 2012-05-04 NOTE — Progress Notes (Signed)
Faxed lupron invoice to [employer] for [facility] 02/09/12 to [phone], policy # [id number].

## 2012-05-08 ENCOUNTER — Other Ambulatory Visit (HOSPITAL_BASED_OUTPATIENT_CLINIC_OR_DEPARTMENT_OTHER): Payer: Medicare Other | Admitting: Lab

## 2012-05-08 ENCOUNTER — Ambulatory Visit (HOSPITAL_BASED_OUTPATIENT_CLINIC_OR_DEPARTMENT_OTHER): Payer: Medicare Other

## 2012-05-08 VITALS — BP 102/68 | HR 92 | Temp 97.3°F

## 2012-05-08 DIAGNOSIS — I259 Chronic ischemic heart disease, unspecified: Secondary | ICD-10-CM

## 2012-05-08 DIAGNOSIS — I495 Sick sinus syndrome: Secondary | ICD-10-CM

## 2012-05-08 DIAGNOSIS — Z950 Presence of cardiac pacemaker: Secondary | ICD-10-CM

## 2012-05-08 DIAGNOSIS — E785 Hyperlipidemia, unspecified: Secondary | ICD-10-CM

## 2012-05-08 DIAGNOSIS — C7952 Secondary malignant neoplasm of bone marrow: Secondary | ICD-10-CM

## 2012-05-08 DIAGNOSIS — G459 Transient cerebral ischemic attack, unspecified: Secondary | ICD-10-CM

## 2012-05-08 DIAGNOSIS — C61 Malignant neoplasm of prostate: Secondary | ICD-10-CM

## 2012-05-08 DIAGNOSIS — C7951 Secondary malignant neoplasm of bone: Secondary | ICD-10-CM

## 2012-05-08 DIAGNOSIS — I951 Orthostatic hypotension: Secondary | ICD-10-CM

## 2012-05-08 DIAGNOSIS — T82110A Breakdown (mechanical) of cardiac electrode, initial encounter: Secondary | ICD-10-CM

## 2012-05-08 LAB — COMPREHENSIVE METABOLIC PANEL (CC13)
Albumin: 3.9 g/dL (ref 3.5–5.0)
Alkaline Phosphatase: 19 U/L — ABNORMAL LOW (ref 40–150)
BUN: 13 mg/dL (ref 7.0–26.0)
CO2: 30 mEq/L — ABNORMAL HIGH (ref 22–29)
Glucose: 75 mg/dl (ref 70–99)
Potassium: 3.9 mEq/L (ref 3.5–5.1)
Total Protein: 6.5 g/dL (ref 6.4–8.3)

## 2012-05-08 MED ORDER — DENOSUMAB 120 MG/1.7ML ~~LOC~~ SOLN
120.0000 mg | Freq: Once | SUBCUTANEOUS | Status: AC
  Administered 2012-05-08: 120 mg via SUBCUTANEOUS
  Filled 2012-05-08: qty 1.7

## 2012-05-09 ENCOUNTER — Encounter: Payer: Self-pay | Admitting: *Deleted

## 2012-06-08 DIAGNOSIS — L02619 Cutaneous abscess of unspecified foot: Secondary | ICD-10-CM | POA: Diagnosis not present

## 2012-06-08 DIAGNOSIS — L03119 Cellulitis of unspecified part of limb: Secondary | ICD-10-CM | POA: Diagnosis not present

## 2012-06-30 ENCOUNTER — Other Ambulatory Visit: Payer: Medicare Other | Admitting: Lab

## 2012-06-30 ENCOUNTER — Ambulatory Visit (HOSPITAL_BASED_OUTPATIENT_CLINIC_OR_DEPARTMENT_OTHER): Payer: Medicare Other | Admitting: Oncology

## 2012-06-30 ENCOUNTER — Telehealth: Payer: Self-pay | Admitting: Oncology

## 2012-06-30 VITALS — BP 125/67 | HR 94 | Temp 97.0°F | Resp 20 | Ht 70.5 in | Wt 150.9 lb

## 2012-06-30 DIAGNOSIS — G459 Transient cerebral ischemic attack, unspecified: Secondary | ICD-10-CM

## 2012-06-30 DIAGNOSIS — I495 Sick sinus syndrome: Secondary | ICD-10-CM

## 2012-06-30 DIAGNOSIS — I259 Chronic ischemic heart disease, unspecified: Secondary | ICD-10-CM

## 2012-06-30 DIAGNOSIS — C61 Malignant neoplasm of prostate: Secondary | ICD-10-CM

## 2012-06-30 DIAGNOSIS — C7951 Secondary malignant neoplasm of bone: Secondary | ICD-10-CM | POA: Diagnosis not present

## 2012-06-30 DIAGNOSIS — Z5111 Encounter for antineoplastic chemotherapy: Secondary | ICD-10-CM

## 2012-06-30 DIAGNOSIS — Z950 Presence of cardiac pacemaker: Secondary | ICD-10-CM

## 2012-06-30 DIAGNOSIS — T82110A Breakdown (mechanical) of cardiac electrode, initial encounter: Secondary | ICD-10-CM

## 2012-06-30 DIAGNOSIS — C7952 Secondary malignant neoplasm of bone marrow: Secondary | ICD-10-CM

## 2012-06-30 DIAGNOSIS — I951 Orthostatic hypotension: Secondary | ICD-10-CM

## 2012-06-30 DIAGNOSIS — E785 Hyperlipidemia, unspecified: Secondary | ICD-10-CM

## 2012-06-30 LAB — CBC WITH DIFFERENTIAL/PLATELET
Basophils Absolute: 0 10*3/uL (ref 0.0–0.1)
Eosinophils Absolute: 0.2 10*3/uL (ref 0.0–0.5)
HGB: 11.7 g/dL — ABNORMAL LOW (ref 13.0–17.1)
MCV: 88.1 fL (ref 79.3–98.0)
MONO#: 0.5 10*3/uL (ref 0.1–0.9)
NEUT#: 2.9 10*3/uL (ref 1.5–6.5)
RBC: 3.89 10*6/uL — ABNORMAL LOW (ref 4.20–5.82)
RDW: 13.3 % (ref 11.0–14.6)
WBC: 5.4 10*3/uL (ref 4.0–10.3)
lymph#: 1.8 10*3/uL (ref 0.9–3.3)

## 2012-06-30 LAB — COMPREHENSIVE METABOLIC PANEL (CC13)
ALT: 11 U/L (ref 0–55)
Albumin: 3.6 g/dL (ref 3.5–5.0)
CO2: 28 mEq/L (ref 22–29)
Chloride: 106 mEq/L (ref 98–107)
Glucose: 97 mg/dl (ref 70–99)
Potassium: 4.3 mEq/L (ref 3.5–5.1)
Sodium: 141 mEq/L (ref 136–145)
Total Protein: 6.5 g/dL (ref 6.4–8.3)

## 2012-06-30 LAB — PSA: PSA: 0.03 ng/mL (ref <=4.00)

## 2012-06-30 MED ORDER — LEUPROLIDE ACETATE (3 MONTH) 22.5 MG IM KIT
22.5000 mg | PACK | Freq: Once | INTRAMUSCULAR | Status: AC
  Administered 2012-06-30: 22.5 mg via INTRAMUSCULAR
  Filled 2012-06-30: qty 22.5

## 2012-06-30 MED ORDER — DENOSUMAB 120 MG/1.7ML ~~LOC~~ SOLN
120.0000 mg | Freq: Once | SUBCUTANEOUS | Status: AC
  Administered 2012-06-30: 120 mg via SUBCUTANEOUS
  Filled 2012-06-30: qty 1.7

## 2012-06-30 NOTE — Telephone Encounter (Signed)
Gave pt appt for lab and injection on April 2014 and injection on February 28th

## 2012-06-30 NOTE — Progress Notes (Signed)
Hematology and Oncology Follow Up Visit  Russell Browning [id number] 1941-05-06 72 y.o. 06/30/2012 9:46 AM    Principle Diagnosis: This is a pleasant 72 year old gentleman with  advanced prostate cancer. He has metastatic disease to the bone. It appears to be hormone sensitive at this time. His cancer was inially diagnosed in 2000.    Prior Therapy: 1. He received radiation therapy and 2 years of Lupron, but it presumably was locally advanced prostate cancer at the time of diagnosis in 2000.  2. PSA was under control from 2001 up until the early parts of 2012, where his PSA rose up to 16.9. At that time, he was under the care of Dr. 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 He subsequently started on  Lupron every 3 months.  3. He is S/P radiation therapy directed to the right hip (30 gray in 12 fractions) concluded in 07/2011 for palliative purposes conducted in [place].    Current therapy:  1. lupron 22.5 mg every three months.  2. Rivka Barbara given every 6 weeks started in 10/2011.   Interim History:  Russell Browning presents today for a follow up visit. He is a pleasant gentleman who I saw for the first time back in January of 2013 for the diagnosis of hormone sensitive advanced prostate cancer. He is currently on Lupron every 3 months with his last PSA  was 0.04. He was complaining off right hip pain, but after radiation therapy his pain is a lot better but needing pain medications at night time. Able to ambulate without any major difficulty his continued to perform activities of daily living without any major decline. He reports hot flashes associated with Lupron but these has improved with Effexor. No complications with Xgeva at this time.  Medications: I have reviewed the patient's current medications. Current outpatient prescriptions:atenolol (TENORMIN) 50 MG tablet, Take 50 mg by mouth daily. Take 1/4 tablet  every day , Disp: , Rfl: ;  Calcium Carbonate-Vitamin D (CALCIUM-VITAMIN D) 500-200 MG-UNIT per tablet, Take 1 tablet by mouth daily., Disp: , Rfl: ;  clonazePAM (KLONOPIN) 1 MG tablet, Take 1 mg by mouth at bedtime. , Disp: , Rfl: ;  clopidogrel (PLAVIX) 75 MG tablet, TAKE 1 TABLET BY MOUTH DAILY, Disp: 30 tablet, Rfl: 1 fentaNYL (DURAGESIC - DOSED MCG/HR) 25 MCG/HR, Place 1 patch onto the skin every 3 (three) days., Disp: , Rfl: ;  leuprolide (LUPRON) 22.5 MG injection, Inject 22.5 mg into the muscle every 3 (three) months., Disp: , Rfl: ;  nitroGLYCERIN (NITROSTAT) 0.4 MG SL tablet, Place 1 tablet (0.4 mg total) under the tongue every 5 (five) minutes as needed for chest pain., Disp: 25 tablet, Rfl: 3 NON FORMULARY, Inject into the muscle every 6 (six) weeks. [facility] injections., Disp: , Rfl: ;  oxyCODONE-acetaminophen (PERCOCET) 10-325 MG per tablet, Take 1 tablet by mouth 4 (four) times daily as needed. , Disp: , Rfl: ;  pantoprazole (PROTONIX) 40 MG tablet, Take 40 mg by mouth daily., Disp: , Rfl: ;  polyethylene glycol (MIRALAX / GLYCOLAX) packet, Take 17 g by mouth daily., Disp: , Rfl:  simvastatin (ZOCOR) 40 MG tablet, Take 40 mg by mouth at bedtime.  , Disp: , Rfl: ;  venlafaxine XR (EFFEXOR-XR) 150 MG 24 hr capsule, Take 150 mg by mouth daily., Disp: , Rfl: ;  vitamin B-12 (CYANOCOBALAMIN) 1000 MCG tablet, Take 1,000 mcg by  mouth daily.  , Disp: , Rfl:  Current facility-administered medications:denosumab (XGEVA) injection 120 mg, 120 mg, Subcutaneous, Once, Benjiman Core, MD  Allergies:  Allergies  Allergen Reactions  . Penicillins   . Phenazopyridine Hcl   . Ramipril     Past Medical History, Surgical history, Social history, and Family History were reviewed and updated.  Review of Systems: Constitutional:  Negative for fever, chills, night sweats, anorexia, weight loss, pain. Cardiovascular: no chest pain or dyspnea on exertion Respiratory: negative Neurological:  negative Dermatological: negative ENT: negative Skin: Negative. Gastrointestinal: negative Genito-Urinary: negative Hematological and Lymphatic: negative Breast: negative Musculoskeletal: negative Remaining ROS negative. Physical Exam: Blood pressure 125/67, pulse 94, temperature 97 F (36.1 C), temperature source Oral, resp. rate 20, height 5' 10.5" (1.791 m), weight 150 lb 14.4 oz (68.448 kg). ECOG: 1 General appearance: alert Head: Normocephalic, without obvious abnormality, atraumatic Neck: no adenopathy, no carotid bruit, no JVD, supple, symmetrical, trachea midline and thyroid not enlarged, symmetric, no tenderness/mass/nodules Lymph nodes: Cervical, supraclavicular, and axillary nodes normal. Heart:regular rate and rhythm, S1, S2 normal, no murmur, click, rub or gallop Lung:chest clear, no wheezing, rales, normal symmetric air entry Abdomin: soft, non-tender, without masses or organomegaly EXT:no erythema, induration, or nodules   Lab Results: Lab Results  Component Value Date   WBC 5.4 06/30/2012   HGB 11.7* 06/30/2012   HCT 34.3* 06/30/2012   MCV 88.1 06/30/2012   PLT 140 06/30/2012   Results for [employer] (MRN [id number]) as of 06/30/2012 09:47  Ref. Range 12/29/2011 08:27 03/30/2012 09:20  PSA Latest Range: <=4.00 ng/mL 0.03 0.04    Impression and Plan: 72 year old gentleman with the following issues:  1. Hormone sensitive prostate cancer he is currently on Lupron with advanced disease to the bone. His PSA is under excellent control. Will give Lupron today and repeat in 3 months.   2. Bone pain: under better control. Still need little pain medications at night time. He already got radiation to that area.   3. Bone directed therapy: Rivka Barbara was well tolerated. We will proceed with the njection  every 6 weeks. 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   4. Follow up in 3 months.    [facility],  MD 1/17/20149:46 AM

## 2012-07-12 ENCOUNTER — Encounter: Payer: Medicare Other | Admitting: Internal Medicine

## 2012-07-13 DIAGNOSIS — IMO0002 Reserved for concepts with insufficient information to code with codable children: Secondary | ICD-10-CM | POA: Diagnosis not present

## 2012-07-13 DIAGNOSIS — C61 Malignant neoplasm of prostate: Secondary | ICD-10-CM | POA: Diagnosis not present

## 2012-07-13 DIAGNOSIS — I10 Essential (primary) hypertension: Secondary | ICD-10-CM | POA: Diagnosis not present

## 2012-07-13 DIAGNOSIS — E782 Mixed hyperlipidemia: Secondary | ICD-10-CM | POA: Diagnosis not present

## 2012-07-13 DIAGNOSIS — E7800 Pure hypercholesterolemia, unspecified: Secondary | ICD-10-CM | POA: Diagnosis not present

## 2012-07-14 DIAGNOSIS — S7010XA Contusion of unspecified thigh, initial encounter: Secondary | ICD-10-CM | POA: Diagnosis not present

## 2012-07-19 DIAGNOSIS — IMO0001 Reserved for inherently not codable concepts without codable children: Secondary | ICD-10-CM | POA: Diagnosis not present

## 2012-07-19 DIAGNOSIS — E782 Mixed hyperlipidemia: Secondary | ICD-10-CM | POA: Diagnosis not present

## 2012-07-19 DIAGNOSIS — IMO0002 Reserved for concepts with insufficient information to code with codable children: Secondary | ICD-10-CM | POA: Diagnosis not present

## 2012-07-19 DIAGNOSIS — G2589 Other specified extrapyramidal and movement disorders: Secondary | ICD-10-CM | POA: Diagnosis not present

## 2012-07-19 DIAGNOSIS — I10 Essential (primary) hypertension: Secondary | ICD-10-CM | POA: Diagnosis not present

## 2012-07-19 DIAGNOSIS — N400 Enlarged prostate without lower urinary tract symptoms: Secondary | ICD-10-CM | POA: Diagnosis not present

## 2012-07-24 ENCOUNTER — Encounter: Payer: Self-pay | Admitting: Internal Medicine

## 2012-07-24 ENCOUNTER — Ambulatory Visit (INDEPENDENT_AMBULATORY_CARE_PROVIDER_SITE_OTHER): Payer: Medicare Other | Admitting: Internal Medicine

## 2012-07-24 VITALS — BP 110/67 | HR 73 | Ht 70.0 in | Wt 150.0 lb

## 2012-07-24 DIAGNOSIS — L570 Actinic keratosis: Secondary | ICD-10-CM | POA: Diagnosis not present

## 2012-07-24 DIAGNOSIS — D485 Neoplasm of uncertain behavior of skin: Secondary | ICD-10-CM | POA: Diagnosis not present

## 2012-07-24 DIAGNOSIS — L259 Unspecified contact dermatitis, unspecified cause: Secondary | ICD-10-CM | POA: Diagnosis not present

## 2012-07-24 DIAGNOSIS — I495 Sick sinus syndrome: Secondary | ICD-10-CM | POA: Diagnosis not present

## 2012-07-24 LAB — PACEMAKER DEVICE OBSERVATION
AL IMPEDENCE PM: 168 Ohm
AL THRESHOLD: 1 V
ATRIAL PACING PM: 86
RV LEAD AMPLITUDE: 15.68 mv
RV LEAD IMPEDENCE PM: 1100 Ohm
RV LEAD THRESHOLD: 1 V

## 2012-07-24 NOTE — Patient Instructions (Signed)
Continue all current medications. [place] - 6 months Dr. Johney Frame - 1 year [facility] remote check 10/23/2012

## 2012-07-24 NOTE — Progress Notes (Signed)
PCP:  Donzetta Sprung, MD Primary Cardiologist:  Dr Diona Browner  The patient presents today for routine electrophysiology followup.  Since last being seen in our clinic, the patient reports doing reasonably well.  His metastatic prostate Ca seems stable.  Today, he denies symptoms of palpitations, chest pain, shortness of breath, orthopnea, dizziness, presyncope, syncope, or neurologic sequela.  The patient feels that he is tolerating medications without difficulties and is otherwise without complaint today.   Past Medical History  Diagnosis Date  . Ischemic     tansient ischemic attack  . Dyslipidemia   . Sinus node dysfunction     Status post pacemaker implantation and generator replacement, atrial lead insulation defect noted at time of generator change by Dr Graciela Husbands 2008  . Single vessel coronary artery disease     severe, status post Taxus at right coronary artery in 2006 and preserved left ventricular  function  . Dyslipidemia     Drug-eluting stent to the right coronary artery November 2,2011, preserved LV function  . Pacemaker lead failure     RA lead insulation defect noted at Generator change by Dr Graciela Husbands 2008, impedance now low, but lead functioning otherwise normally  . Prostate cancer metastatic to multiple sites     s/p treatment, bone mets recently discovered   Past Surgical History  Procedure Laterality Date  . Pacemaker insertion      MDT    Current Outpatient Prescriptions  Medication Sig Dispense Refill  . atenolol (TENORMIN) 50 MG tablet Take 50 mg by mouth daily. Take 1/4 tablet every day       . Calcium Carbonate-Vitamin D (CALCIUM-VITAMIN D) 500-200 MG-UNIT per tablet Take 1 tablet by mouth daily.      . clobetasol ointment (TEMOVATE) 0.05 % Apply 1 application topically daily.      . clonazePAM (KLONOPIN) 1 MG tablet Take 1 mg by mouth at bedtime.       . clopidogrel (PLAVIX) 75 MG tablet TAKE 1 TABLET BY MOUTH DAILY  30 tablet  1  . fentaNYL (DURAGESIC - DOSED  MCG/HR) 25 MCG/HR Place 1 patch onto the skin every 3 (three) days.      Marland Kitchen leuprolide (LUPRON) 22.5 MG injection Inject 22.5 mg into the muscle every 3 (three) months.      . nitroGLYCERIN (NITROSTAT) 0.4 MG SL tablet Place 1 tablet (0.4 mg total) under the tongue every 5 (five) minutes as needed for chest pain.  25 tablet  3  . NON FORMULARY Inject into the muscle every 6 (six) weeks. [facility] injections.      Marland Kitchen oxyCODONE-acetaminophen (PERCOCET) 10-325 MG per tablet Take 1 tablet by mouth 4 (four) times daily as needed.       . pantoprazole (PROTONIX) 40 MG tablet Take 40 mg by mouth daily.      . polyethylene glycol (MIRALAX / GLYCOLAX) packet Take 17 g by mouth daily.      . simvastatin (ZOCOR) 40 MG tablet Take 40 mg by mouth at bedtime.        Marland Kitchen venlafaxine XR (EFFEXOR-XR) 150 MG 24 hr capsule Take 150 mg by mouth daily.      . vitamin B-12 (CYANOCOBALAMIN) 1000 MCG tablet Take 1,000 mcg by mouth daily.         No current facility-administered medications for this visit.    Allergies  Allergen Reactions  . Penicillins   . Phenazopyridine Hcl   . Ramipril     History   Social History  .  Marital Status: Married    Spouse Name: N/A    Number of Children: N/A  . Years of Education: N/A   Occupational History  . Not on file.   Social History Main Topics  . Smoking status: Former Smoker -- 1.00 packs/day for 5 years    Types: Cigarettes    Quit date: 06/14/1965  . Smokeless tobacco: Former Neurosurgeon    Types: Chew    Quit date: 06/14/1978     Comment: chewed tobacco 5 years 1PPD  . Alcohol Use: No  . Drug Use: No  . Sexually Active: Not on file   Other Topics Concern  . Not on file   Social History Narrative  . No narrative on file    Physical Exam: Filed Vitals:   07/24/12 1254  BP: 110/67  Pulse: 73  Height: 5\' 10"  (1.778 m)  Weight: 150 lb (68.04 kg)    GEN- The patient is well appearing, alert and oriented x 3 today.   Head- normocephalic,  atraumatic Eyes-  Sclera clear, conjunctiva pink Ears- hearing intact Oropharynx- clear Neck- supple, no JVP Lymph- no cervical lymphadenopathy Lungs- Clear to ausculation bilaterally, normal work of breathing Chest- pacemaker pocket is well healed Heart- Regular rate and rhythm, no murmurs, rubs or gallops, PMI not laterally displaced GI- soft, NT, ND, + BS Extremities- no clubbing, cyanosis, or edema  Pacemaker interrogation- reviewed in detail today,  See PACEART report  Assessment and Plan:  1. 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  Battery status remains good. No changes at this time  2. CAD No ischemic symptoms No changes  Return to the device clinic in 1year Follow-up with Dr Diona Browner in 6 months

## 2012-08-11 ENCOUNTER — Ambulatory Visit (HOSPITAL_BASED_OUTPATIENT_CLINIC_OR_DEPARTMENT_OTHER): Payer: Medicare Other

## 2012-08-11 VITALS — BP 123/72 | HR 85 | Temp 98.2°F

## 2012-08-11 DIAGNOSIS — I259 Chronic ischemic heart disease, unspecified: Secondary | ICD-10-CM

## 2012-08-11 DIAGNOSIS — C7951 Secondary malignant neoplasm of bone: Secondary | ICD-10-CM

## 2012-08-11 DIAGNOSIS — T82110A Breakdown (mechanical) of cardiac electrode, initial encounter: Secondary | ICD-10-CM

## 2012-08-11 DIAGNOSIS — C61 Malignant neoplasm of prostate: Secondary | ICD-10-CM | POA: Diagnosis not present

## 2012-08-11 DIAGNOSIS — I495 Sick sinus syndrome: Secondary | ICD-10-CM

## 2012-08-11 DIAGNOSIS — I951 Orthostatic hypotension: Secondary | ICD-10-CM

## 2012-08-11 DIAGNOSIS — E785 Hyperlipidemia, unspecified: Secondary | ICD-10-CM

## 2012-08-11 DIAGNOSIS — G459 Transient cerebral ischemic attack, unspecified: Secondary | ICD-10-CM

## 2012-08-11 DIAGNOSIS — Z950 Presence of cardiac pacemaker: Secondary | ICD-10-CM

## 2012-08-11 MED ORDER — DENOSUMAB 120 MG/1.7ML ~~LOC~~ SOLN
120.0000 mg | Freq: Once | SUBCUTANEOUS | Status: AC
  Administered 2012-08-11: 120 mg via SUBCUTANEOUS
  Filled 2012-08-11: qty 1.7

## 2012-09-05 ENCOUNTER — Other Ambulatory Visit: Payer: Self-pay | Admitting: *Deleted

## 2012-09-05 NOTE — Progress Notes (Signed)
Per dr Clelia Croft okay for patient to get his xgeva with in the next week. D/t patient's insurance, cannot receive both injections on the same day. pof to schedulers for injection appt.

## 2012-09-09 ENCOUNTER — Telehealth: Payer: Self-pay | Admitting: Oncology

## 2012-09-09 NOTE — Telephone Encounter (Signed)
S/w the pt and he is aware of his injection appt this Monday on 09/11/2012@10 :45am.

## 2012-09-11 ENCOUNTER — Ambulatory Visit (HOSPITAL_BASED_OUTPATIENT_CLINIC_OR_DEPARTMENT_OTHER): Payer: Medicare Other

## 2012-09-11 ENCOUNTER — Ambulatory Visit: Payer: Medicare Other

## 2012-09-11 VITALS — BP 117/73 | HR 66 | Temp 98.1°F

## 2012-09-11 DIAGNOSIS — C61 Malignant neoplasm of prostate: Secondary | ICD-10-CM | POA: Diagnosis not present

## 2012-09-11 DIAGNOSIS — T82110D Breakdown (mechanical) of cardiac electrode, subsequent encounter: Secondary | ICD-10-CM

## 2012-09-11 DIAGNOSIS — I951 Orthostatic hypotension: Secondary | ICD-10-CM

## 2012-09-11 DIAGNOSIS — I495 Sick sinus syndrome: Secondary | ICD-10-CM

## 2012-09-11 DIAGNOSIS — I259 Chronic ischemic heart disease, unspecified: Secondary | ICD-10-CM

## 2012-09-11 DIAGNOSIS — C7951 Secondary malignant neoplasm of bone: Secondary | ICD-10-CM | POA: Diagnosis not present

## 2012-09-11 DIAGNOSIS — G459 Transient cerebral ischemic attack, unspecified: Secondary | ICD-10-CM

## 2012-09-11 DIAGNOSIS — E785 Hyperlipidemia, unspecified: Secondary | ICD-10-CM

## 2012-09-11 DIAGNOSIS — C7952 Secondary malignant neoplasm of bone marrow: Secondary | ICD-10-CM

## 2012-09-11 DIAGNOSIS — Z950 Presence of cardiac pacemaker: Secondary | ICD-10-CM

## 2012-09-11 MED ORDER — DENOSUMAB 120 MG/1.7ML ~~LOC~~ SOLN
120.0000 mg | Freq: Once | SUBCUTANEOUS | Status: AC
  Administered 2012-09-11: 120 mg via SUBCUTANEOUS
  Filled 2012-09-11: qty 1.7

## 2012-09-11 NOTE — Patient Instructions (Addendum)
Denosumab injection What is this medicine? DENOSUMAB slows bone breakdown. It is used to treat osteoporosis in women after menopause and in men. This medicine is also used to prevent bone fractures and other bone problems caused by cancer bone metastases. This medicine may be used for other purposes; ask your health care provider or pharmacist if you have questions. What should I tell my health care provider before I take this medicine? They need to know if you have any of these conditions: -dental disease -eczema -infection or history of infections -kidney disease or on dialysis -low blood calcium or vitamin D -malabsorption syndrome -scheduled to have surgery or tooth extraction -taking medicine that contains denosumab -thyroid or parathyroid disease -an unusual reaction to denosumab, other medicines, foods, dyes, or preservatives -pregnant or trying to get pregnant -breast-feeding How should I use this medicine? This medicine is for injection under the skin. It is given by a health care professional in a hospital or clinic setting. If you are getting Prolia, a special MedGuide will be given to you by the pharmacist with each prescription and refill. Be sure to read this information carefully each time. Talk to your pediatrician regarding the use of this medicine in children. Special care may be needed. Overdosage: If you think you've taken too much of this medicine contact a poison control center or emergency room at once. Overdosage: If you think you have taken too much of this medicine contact a poison control center or emergency room at once. NOTE: This medicine is only for you. Do not share this medicine with others. What if I miss a dose? It is important not to miss your dose. Call your doctor or health care professional if you are unable to keep an appointment. What may interact with this medicine? Do not take this medicine with any of the following medications: -other medicines  containing denosumab This medicine may also interact with the following medications: -medicines that suppress the immune system -medicines that treat cancer -steroid medicines like prednisone or cortisone This list may not describe all possible interactions. Give your health care provider a list of all the medicines, herbs, non-prescription drugs, or dietary supplements you use. Also tell them if you smoke, drink alcohol, or use illegal drugs. Some items may interact with your medicine. What should I watch for while using this medicine? Visit your doctor or health care professional for regular checks on your progress. Your doctor or health care professional may order blood tests and other tests to see how you are doing. Call your doctor or health care professional if you get a cold or other infection while receiving this medicine. Do not treat yourself. This medicine may decrease your body's ability to fight infection. You should make sure you get enough calcium and vitamin D while you are taking this medicine, unless your doctor tells you not to. Discuss the foods you eat and the vitamins you take with your health care professional. See your dentist regularly. Brush and floss your teeth as directed. Before you have any dental work done, tell your dentist you are receiving this medicine. What side effects may I notice from receiving this medicine? Side effects that you should report to your doctor or health care professional as soon as possible: -allergic reactions like skin rash, itching or hives, swelling of the face, lips, or tongue -breathing problems -chest pain -fast, irregular heartbeat -feeling faint or lightheaded, falls -fever, chills, or any other sign of infection -muscle spasms, tightening, or twitches -numbness   or tingling -skin blisters or bumps, or is dry, peels, or red -slow healing or unexplained pain in the mouth or jaw -unusual bleeding or bruising Side effects that  usually do not require medical attention (Report these to your doctor or health care professional if they continue or are bothersome.): -muscle pain -stomach upset, gas This list may not describe all possible side effects. Call your doctor for medical advice about side effects. You may report side effects to FDA at 1-800-FDA-1088. Where should I keep my medicine? This medicine is only given in a clinic, doctor's office, or other health care setting and will not be stored at home. NOTE: This sheet is a summary. It may not cover all possible information. If you have questions about this medicine, talk to your doctor, pharmacist, or health care provider.  2013, [employer]. (03/09/2011 3:40:41 PM)  

## 2012-09-29 ENCOUNTER — Telehealth: Payer: Self-pay | Admitting: Oncology

## 2012-09-29 ENCOUNTER — Ambulatory Visit (HOSPITAL_BASED_OUTPATIENT_CLINIC_OR_DEPARTMENT_OTHER): Payer: Medicare Other | Admitting: Oncology

## 2012-09-29 ENCOUNTER — Other Ambulatory Visit (HOSPITAL_BASED_OUTPATIENT_CLINIC_OR_DEPARTMENT_OTHER): Payer: Medicare Other | Admitting: Lab

## 2012-09-29 VITALS — BP 107/62 | HR 84 | Temp 97.0°F | Resp 20 | Ht 70.0 in | Wt 150.9 lb

## 2012-09-29 DIAGNOSIS — I259 Chronic ischemic heart disease, unspecified: Secondary | ICD-10-CM | POA: Diagnosis not present

## 2012-09-29 DIAGNOSIS — Z950 Presence of cardiac pacemaker: Secondary | ICD-10-CM

## 2012-09-29 DIAGNOSIS — I495 Sick sinus syndrome: Secondary | ICD-10-CM | POA: Diagnosis not present

## 2012-09-29 DIAGNOSIS — E785 Hyperlipidemia, unspecified: Secondary | ICD-10-CM

## 2012-09-29 DIAGNOSIS — Z5111 Encounter for antineoplastic chemotherapy: Secondary | ICD-10-CM

## 2012-09-29 DIAGNOSIS — G459 Transient cerebral ischemic attack, unspecified: Secondary | ICD-10-CM

## 2012-09-29 DIAGNOSIS — I951 Orthostatic hypotension: Secondary | ICD-10-CM

## 2012-09-29 DIAGNOSIS — C61 Malignant neoplasm of prostate: Secondary | ICD-10-CM

## 2012-09-29 DIAGNOSIS — M25559 Pain in unspecified hip: Secondary | ICD-10-CM | POA: Diagnosis not present

## 2012-09-29 DIAGNOSIS — C7951 Secondary malignant neoplasm of bone: Secondary | ICD-10-CM | POA: Diagnosis not present

## 2012-09-29 DIAGNOSIS — T82110A Breakdown (mechanical) of cardiac electrode, initial encounter: Secondary | ICD-10-CM

## 2012-09-29 DIAGNOSIS — C7952 Secondary malignant neoplasm of bone marrow: Secondary | ICD-10-CM

## 2012-09-29 LAB — CBC WITH DIFFERENTIAL/PLATELET
BASO%: 0.4 % (ref 0.0–2.0)
EOS%: 4.3 % (ref 0.0–7.0)
HCT: 34.7 % — ABNORMAL LOW (ref 38.4–49.9)
LYMPH%: 35.8 % (ref 14.0–49.0)
MCH: 29.7 pg (ref 27.2–33.4)
MCHC: 33.6 g/dL (ref 32.0–36.0)
NEUT%: 50.6 % (ref 39.0–75.0)
Platelets: 151 10*3/uL (ref 140–400)
RBC: 3.93 10*6/uL — ABNORMAL LOW (ref 4.20–5.82)

## 2012-09-29 LAB — COMPREHENSIVE METABOLIC PANEL (CC13)
ALT: 14 U/L (ref 0–55)
AST: 15 U/L (ref 5–34)
Alkaline Phosphatase: 21 U/L — ABNORMAL LOW (ref 40–150)
Creatinine: 1.1 mg/dL (ref 0.7–1.3)
Sodium: 142 mEq/L (ref 136–145)
Total Bilirubin: 0.26 mg/dL (ref 0.20–1.20)

## 2012-09-29 MED ORDER — LEUPROLIDE ACETATE (4 MONTH) 30 MG IM KIT
30.0000 mg | PACK | Freq: Once | INTRAMUSCULAR | Status: AC
  Administered 2012-09-29: 30 mg via INTRAMUSCULAR
  Filled 2012-09-29: qty 30

## 2012-09-29 MED ORDER — SENNOSIDES-DOCUSATE SODIUM 8.6-50 MG PO TABS: 2.0000 | ORAL_TABLET | Freq: Every day | ORAL | Status: DC

## 2012-09-29 NOTE — Telephone Encounter (Signed)
gv and printed appt sched and avs for pt  °

## 2012-09-29 NOTE — Progress Notes (Signed)
Hematology and Oncology Follow Up Visit  Russell Browning [id number] Feb 06, 1941 72 y.o. 09/29/2012 8:54 AM    Principle Diagnosis: This is a pleasant 72 year old gentleman with  advanced prostate cancer. He has metastatic disease to the bone. It appears to be hormone sensitive at this time. His cancer was inially diagnosed in 2000.    Prior Therapy: 1. He received radiation therapy and 2 years of Lupron, but it presumably was locally advanced prostate cancer at the time of diagnosis in 2000.  2. PSA was under control from 2001 up until the early parts of 2012, where his PSA rose up to 16.9. At that time, he was under the care of Dr. 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 He subsequently started on  Lupron every 3 months.  3. He is S/P radiation therapy directed to the right hip (30 gray in 12 fractions) concluded in 07/2011 for palliative purposes conducted in [place].    Current therapy:  1. lupron 22.5 mg every three months.  2. Rivka Barbara given every 4 weeks started in 10/2011.   Interim History:  Mr. Convey presents today for a follow up visit. He is a pleasant gentleman who I saw for the first time back in January of 2013 for the diagnosis of hormone sensitive advanced prostate cancer. He is currently on Lupron every 3 months with his last PSA  was 0.04. He was complaining off right hip pain, but after radiation therapy his pain is a lot better but needing pain medications at night time. Able to ambulate without any major difficulty his continued to perform activities of daily living without any major decline. He reports hot flashes associated with Lupron but these has improved with Effexor. No complications with Xgeva at this time. He report some constipation at times.   Medications: I have reviewed the patient's current medications. Current outpatient prescriptions:atenolol (TENORMIN) 50 MG tablet, Take 50  mg by mouth daily. Take 1/4 tablet every day , Disp: , Rfl: ;  Calcium Carbonate-Vitamin D (CALCIUM-VITAMIN D) 500-200 MG-UNIT per tablet, Take 1 tablet by mouth daily., Disp: , Rfl: ;  clobetasol ointment (TEMOVATE) 0.05 %, Apply 1 application topically daily., Disp: , Rfl: ;  clonazePAM (KLONOPIN) 1 MG tablet, Take 1 mg by mouth at bedtime. , Disp: , Rfl:  clopidogrel (PLAVIX) 75 MG tablet, TAKE 1 TABLET BY MOUTH DAILY, Disp: 30 tablet, Rfl: 1;  fentaNYL (DURAGESIC - DOSED MCG/HR) 25 MCG/HR, Place 1 patch onto the skin every 3 (three) days., Disp: , Rfl: ;  leuprolide (LUPRON) 22.5 MG injection, Inject 22.5 mg into the muscle every 3 (three) months., Disp: , Rfl:  nitroGLYCERIN (NITROSTAT) 0.4 MG SL tablet, Place 1 tablet (0.4 mg total) under the tongue every 5 (five) minutes as needed for chest pain., Disp: 25 tablet, Rfl: 3;  NON FORMULARY, Inject into the muscle every 30 (thirty) days. [facility] injections., Disp: , Rfl: ;  oxyCODONE-acetaminophen (PERCOCET) 10-325 MG per tablet, Take 1 tablet by mouth 4 (four) times daily as needed. , Disp: , Rfl:  pantoprazole (PROTONIX) 40 MG tablet, Take 40 mg by mouth daily., Disp: , Rfl: ;  polyethylene glycol (MIRALAX / GLYCOLAX) packet, Take 17 g by mouth daily., Disp: , Rfl: ;  simvastatin (ZOCOR) 40 MG tablet, Take 40 mg by mouth at bedtime.  , Disp: , Rfl: ;  venlafaxine XR (EFFEXOR-XR) 150 MG 24 hr  capsule, Take 150 mg by mouth daily., Disp: , Rfl: ;  vitamin B-12 (CYANOCOBALAMIN) 1000 MCG tablet, Take 1,000 mcg by mouth daily.  , Disp: , Rfl:  senna-docusate (EQ SENNA-S) 8.6-50 MG per tablet, Take 2 tablets by mouth daily., Disp: 60 tablet, Rfl: 3 Current facility-administered medications:leuprolide (LUPRON) injection 30 mg, 30 mg, Intramuscular, Once, Benjiman Core, MD  Allergies:  Allergies  Allergen Reactions  . Penicillins   . Phenazopyridine Hcl   . Ramipril     Past Medical History, Surgical history, Social history, and Family History were  reviewed and updated.  Review of Systems: Constitutional:  Negative for fever, chills, night sweats, anorexia, weight loss, pain. Cardiovascular: no chest pain or dyspnea on exertion Respiratory: negative Neurological: negative Dermatological: negative ENT: negative Skin: Negative. Gastrointestinal: negative Genito-Urinary: negative Hematological and Lymphatic: negative Breast: negative Musculoskeletal: negative Remaining ROS negative. Physical Exam: Blood pressure 107/62, pulse 84, temperature 97 F (36.1 C), temperature source Oral, resp. rate 20, height 5\' 10"  (1.778 m), weight 150 lb 14.4 oz (68.448 kg). ECOG: 1 General appearance: alert Head: Normocephalic, without obvious abnormality, atraumatic Neck: no adenopathy, no carotid bruit, no JVD, supple, symmetrical, trachea midline and thyroid not enlarged, symmetric, no tenderness/mass/nodules Lymph nodes: Cervical, supraclavicular, and axillary nodes normal. Heart:regular rate and rhythm, S1, S2 normal, no murmur, click, rub or gallop Lung:chest clear, no wheezing, rales, normal symmetric air entry Abdomin: soft, non-tender, without masses or organomegaly EXT:no erythema, induration, or nodules   Lab Results: Lab Results  Component Value Date   WBC 4.8 09/29/2012   HGB 11.7* 09/29/2012   HCT 34.7* 09/29/2012   MCV 88.5 09/29/2012   PLT 151 09/29/2012   Results for [name], Russell (MRN [id number]) as of 09/29/2012 08:32  Ref. Range 03/30/2012 09:20 06/30/2012 09:15  PSA Latest Range: <=4.00 ng/mL 0.04 0.03     Impression and Plan: 72 year old gentleman with the following issues:  1. Hormone sensitive prostate cancer he is currently on Lupron with advanced disease to the bone. His PSA is under excellent control. Will give Lupron today and repeat in 3 months.   2. Bone pain: under better control. Still need little pain medications at night time. He already got radiation to that area.   3. Bone directed therapy: Rivka Barbara was  well tolerated. He will continue his injections monthly and will be due again on 4/30. 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   4. Follow up in 3 months.    [facility], MD 4/18/20148:54 AM

## 2012-10-11 ENCOUNTER — Ambulatory Visit (HOSPITAL_BASED_OUTPATIENT_CLINIC_OR_DEPARTMENT_OTHER): Payer: Medicare Other

## 2012-10-11 VITALS — BP 109/60 | HR 80 | Temp 98.3°F

## 2012-10-11 DIAGNOSIS — I259 Chronic ischemic heart disease, unspecified: Secondary | ICD-10-CM

## 2012-10-11 DIAGNOSIS — I495 Sick sinus syndrome: Secondary | ICD-10-CM

## 2012-10-11 DIAGNOSIS — T82110D Breakdown (mechanical) of cardiac electrode, subsequent encounter: Secondary | ICD-10-CM

## 2012-10-11 DIAGNOSIS — C61 Malignant neoplasm of prostate: Secondary | ICD-10-CM

## 2012-10-11 DIAGNOSIS — E785 Hyperlipidemia, unspecified: Secondary | ICD-10-CM

## 2012-10-11 DIAGNOSIS — G459 Transient cerebral ischemic attack, unspecified: Secondary | ICD-10-CM

## 2012-10-11 DIAGNOSIS — I951 Orthostatic hypotension: Secondary | ICD-10-CM

## 2012-10-11 DIAGNOSIS — Z950 Presence of cardiac pacemaker: Secondary | ICD-10-CM

## 2012-10-11 DIAGNOSIS — C7951 Secondary malignant neoplasm of bone: Secondary | ICD-10-CM

## 2012-10-11 MED ORDER — DENOSUMAB 120 MG/1.7ML ~~LOC~~ SOLN
120.0000 mg | Freq: Once | SUBCUTANEOUS | Status: AC
  Administered 2012-10-11: 120 mg via SUBCUTANEOUS
  Filled 2012-10-11: qty 1.7

## 2012-10-23 ENCOUNTER — Encounter: Payer: Medicare Other | Admitting: *Deleted

## 2012-10-24 DIAGNOSIS — N400 Enlarged prostate without lower urinary tract symptoms: Secondary | ICD-10-CM | POA: Diagnosis not present

## 2012-10-24 DIAGNOSIS — I10 Essential (primary) hypertension: Secondary | ICD-10-CM | POA: Diagnosis not present

## 2012-10-24 DIAGNOSIS — IMO0001 Reserved for inherently not codable concepts without codable children: Secondary | ICD-10-CM | POA: Diagnosis not present

## 2012-10-24 DIAGNOSIS — E782 Mixed hyperlipidemia: Secondary | ICD-10-CM | POA: Diagnosis not present

## 2012-10-24 DIAGNOSIS — G2589 Other specified extrapyramidal and movement disorders: Secondary | ICD-10-CM | POA: Diagnosis not present

## 2012-11-03 ENCOUNTER — Encounter: Payer: Self-pay | Admitting: *Deleted

## 2012-11-09 ENCOUNTER — Other Ambulatory Visit: Payer: Self-pay | Admitting: *Deleted

## 2012-11-09 DIAGNOSIS — C61 Malignant neoplasm of prostate: Secondary | ICD-10-CM

## 2012-11-10 ENCOUNTER — Ambulatory Visit (INDEPENDENT_AMBULATORY_CARE_PROVIDER_SITE_OTHER): Payer: Medicare Other | Admitting: *Deleted

## 2012-11-10 ENCOUNTER — Other Ambulatory Visit (HOSPITAL_BASED_OUTPATIENT_CLINIC_OR_DEPARTMENT_OTHER): Payer: Medicare Other | Admitting: Lab

## 2012-11-10 ENCOUNTER — Ambulatory Visit (HOSPITAL_BASED_OUTPATIENT_CLINIC_OR_DEPARTMENT_OTHER): Payer: Medicare Other

## 2012-11-10 ENCOUNTER — Telehealth: Payer: Self-pay | Admitting: Oncology

## 2012-11-10 VITALS — BP 110/74 | HR 82 | Temp 98.0°F

## 2012-11-10 DIAGNOSIS — E785 Hyperlipidemia, unspecified: Secondary | ICD-10-CM

## 2012-11-10 DIAGNOSIS — C7951 Secondary malignant neoplasm of bone: Secondary | ICD-10-CM | POA: Diagnosis not present

## 2012-11-10 DIAGNOSIS — C61 Malignant neoplasm of prostate: Secondary | ICD-10-CM | POA: Diagnosis not present

## 2012-11-10 DIAGNOSIS — I495 Sick sinus syndrome: Secondary | ICD-10-CM

## 2012-11-10 DIAGNOSIS — Z950 Presence of cardiac pacemaker: Secondary | ICD-10-CM | POA: Diagnosis not present

## 2012-11-10 DIAGNOSIS — T82110A Breakdown (mechanical) of cardiac electrode, initial encounter: Secondary | ICD-10-CM

## 2012-11-10 DIAGNOSIS — I259 Chronic ischemic heart disease, unspecified: Secondary | ICD-10-CM

## 2012-11-10 DIAGNOSIS — G459 Transient cerebral ischemic attack, unspecified: Secondary | ICD-10-CM

## 2012-11-10 DIAGNOSIS — I951 Orthostatic hypotension: Secondary | ICD-10-CM

## 2012-11-10 LAB — COMPREHENSIVE METABOLIC PANEL (CC13)
ALT: 14 U/L (ref 0–55)
CO2: 29 mEq/L (ref 22–29)
Calcium: 9 mg/dL (ref 8.4–10.4)
Chloride: 103 mEq/L (ref 98–107)
Sodium: 140 mEq/L (ref 136–145)
Total Protein: 6.5 g/dL (ref 6.4–8.3)

## 2012-11-10 MED ORDER — DENOSUMAB 120 MG/1.7ML ~~LOC~~ SOLN
120.0000 mg | Freq: Once | SUBCUTANEOUS | Status: AC
  Administered 2012-11-10: 120 mg via SUBCUTANEOUS
  Filled 2012-11-10: qty 1.7

## 2012-11-10 NOTE — Telephone Encounter (Signed)
add pt lab be inj...Marland Kitchenpt here ok and aware

## 2012-11-13 LAB — REMOTE PACEMAKER DEVICE
AL AMPLITUDE: 2.8 mv
AL IMPEDENCE PM: 166 Ohm
BATTERY VOLTAGE: 2.8 V
BRDY-0004RV: 150 {beats}/min
RV LEAD AMPLITUDE: 22.4 mv

## 2012-11-22 ENCOUNTER — Encounter: Payer: Self-pay | Admitting: *Deleted

## 2012-11-27 ENCOUNTER — Encounter: Payer: Self-pay | Admitting: Oncology

## 2012-11-27 NOTE — Progress Notes (Signed)
Faxed claim information to [employer] @ [phone].

## 2012-12-07 ENCOUNTER — Encounter: Payer: Self-pay | Admitting: Internal Medicine

## 2012-12-11 ENCOUNTER — Ambulatory Visit: Payer: Medicare Other

## 2012-12-11 ENCOUNTER — Other Ambulatory Visit: Payer: Medicare Other | Admitting: Lab

## 2012-12-11 ENCOUNTER — Ambulatory Visit (HOSPITAL_BASED_OUTPATIENT_CLINIC_OR_DEPARTMENT_OTHER): Payer: Medicare Other

## 2012-12-11 ENCOUNTER — Other Ambulatory Visit (HOSPITAL_BASED_OUTPATIENT_CLINIC_OR_DEPARTMENT_OTHER): Payer: Medicare Other | Admitting: Lab

## 2012-12-11 VITALS — BP 121/67 | HR 87 | Temp 98.1°F

## 2012-12-11 DIAGNOSIS — I259 Chronic ischemic heart disease, unspecified: Secondary | ICD-10-CM

## 2012-12-11 DIAGNOSIS — I495 Sick sinus syndrome: Secondary | ICD-10-CM

## 2012-12-11 DIAGNOSIS — C61 Malignant neoplasm of prostate: Secondary | ICD-10-CM

## 2012-12-11 DIAGNOSIS — C7952 Secondary malignant neoplasm of bone marrow: Secondary | ICD-10-CM

## 2012-12-11 DIAGNOSIS — E785 Hyperlipidemia, unspecified: Secondary | ICD-10-CM

## 2012-12-11 DIAGNOSIS — T82110D Breakdown (mechanical) of cardiac electrode, subsequent encounter: Secondary | ICD-10-CM

## 2012-12-11 DIAGNOSIS — I951 Orthostatic hypotension: Secondary | ICD-10-CM

## 2012-12-11 DIAGNOSIS — Z950 Presence of cardiac pacemaker: Secondary | ICD-10-CM

## 2012-12-11 DIAGNOSIS — C7951 Secondary malignant neoplasm of bone: Secondary | ICD-10-CM

## 2012-12-11 DIAGNOSIS — G459 Transient cerebral ischemic attack, unspecified: Secondary | ICD-10-CM

## 2012-12-11 LAB — COMPREHENSIVE METABOLIC PANEL (CC13)
ALT: 13 U/L (ref 0–55)
BUN: 11.8 mg/dL (ref 7.0–26.0)
CO2: 29 mEq/L (ref 22–29)
Creatinine: 1.1 mg/dL (ref 0.7–1.3)
Total Bilirubin: 0.26 mg/dL (ref 0.20–1.20)

## 2012-12-11 MED ORDER — DENOSUMAB 120 MG/1.7ML ~~LOC~~ SOLN
120.0000 mg | Freq: Once | SUBCUTANEOUS | Status: AC
  Administered 2012-12-11: 120 mg via SUBCUTANEOUS
  Filled 2012-12-11: qty 1.7

## 2012-12-27 DIAGNOSIS — I10 Essential (primary) hypertension: Secondary | ICD-10-CM | POA: Diagnosis not present

## 2012-12-27 DIAGNOSIS — IMO0002 Reserved for concepts with insufficient information to code with codable children: Secondary | ICD-10-CM | POA: Diagnosis not present

## 2012-12-27 DIAGNOSIS — E782 Mixed hyperlipidemia: Secondary | ICD-10-CM | POA: Diagnosis not present

## 2012-12-29 ENCOUNTER — Other Ambulatory Visit (HOSPITAL_BASED_OUTPATIENT_CLINIC_OR_DEPARTMENT_OTHER): Payer: Medicare Other | Admitting: Lab

## 2012-12-29 ENCOUNTER — Ambulatory Visit (HOSPITAL_BASED_OUTPATIENT_CLINIC_OR_DEPARTMENT_OTHER): Payer: Medicare Other | Admitting: Oncology

## 2012-12-29 ENCOUNTER — Telehealth: Payer: Self-pay | Admitting: Oncology

## 2012-12-29 VITALS — BP 134/81 | HR 88 | Temp 97.5°F | Resp 18 | Ht 70.0 in | Wt 144.9 lb

## 2012-12-29 DIAGNOSIS — Z5111 Encounter for antineoplastic chemotherapy: Secondary | ICD-10-CM

## 2012-12-29 DIAGNOSIS — C7952 Secondary malignant neoplasm of bone marrow: Secondary | ICD-10-CM

## 2012-12-29 DIAGNOSIS — G893 Neoplasm related pain (acute) (chronic): Secondary | ICD-10-CM

## 2012-12-29 DIAGNOSIS — I259 Chronic ischemic heart disease, unspecified: Secondary | ICD-10-CM

## 2012-12-29 DIAGNOSIS — Z950 Presence of cardiac pacemaker: Secondary | ICD-10-CM

## 2012-12-29 DIAGNOSIS — G459 Transient cerebral ischemic attack, unspecified: Secondary | ICD-10-CM

## 2012-12-29 DIAGNOSIS — T82110A Breakdown (mechanical) of cardiac electrode, initial encounter: Secondary | ICD-10-CM

## 2012-12-29 DIAGNOSIS — C61 Malignant neoplasm of prostate: Secondary | ICD-10-CM

## 2012-12-29 DIAGNOSIS — E785 Hyperlipidemia, unspecified: Secondary | ICD-10-CM

## 2012-12-29 DIAGNOSIS — I951 Orthostatic hypotension: Secondary | ICD-10-CM

## 2012-12-29 DIAGNOSIS — I495 Sick sinus syndrome: Secondary | ICD-10-CM

## 2012-12-29 DIAGNOSIS — C7951 Secondary malignant neoplasm of bone: Secondary | ICD-10-CM | POA: Diagnosis not present

## 2012-12-29 LAB — CBC WITH DIFFERENTIAL/PLATELET
Basophils Absolute: 0 10*3/uL (ref 0.0–0.1)
Eosinophils Absolute: 0.1 10*3/uL (ref 0.0–0.5)
HCT: 33.8 % — ABNORMAL LOW (ref 38.4–49.9)
HGB: 11.4 g/dL — ABNORMAL LOW (ref 13.0–17.1)
LYMPH%: 28.1 % (ref 14.0–49.0)
MCHC: 33.9 g/dL (ref 32.0–36.0)
MONO#: 0.4 10*3/uL (ref 0.1–0.9)
NEUT#: 2.9 10*3/uL (ref 1.5–6.5)
NEUT%: 59.8 % (ref 39.0–75.0)
Platelets: 151 10*3/uL (ref 140–400)
WBC: 4.9 10*3/uL (ref 4.0–10.3)
lymph#: 1.4 10*3/uL (ref 0.9–3.3)

## 2012-12-29 LAB — COMPREHENSIVE METABOLIC PANEL (CC13)
BUN: 13.5 mg/dL (ref 7.0–26.0)
CO2: 28 mEq/L (ref 22–29)
Calcium: 9.3 mg/dL (ref 8.4–10.4)
Chloride: 106 mEq/L (ref 98–109)
Creatinine: 1 mg/dL (ref 0.7–1.3)
Glucose: 70 mg/dl (ref 70–140)
Total Bilirubin: 0.26 mg/dL (ref 0.20–1.20)

## 2012-12-29 LAB — PSA: PSA: 0.04 ng/mL (ref <=4.00)

## 2012-12-29 MED ORDER — LEUPROLIDE ACETATE (4 MONTH) 30 MG IM KIT
30.0000 mg | PACK | Freq: Once | INTRAMUSCULAR | Status: AC
  Administered 2012-12-29: 30 mg via INTRAMUSCULAR
  Filled 2012-12-29: qty 30

## 2012-12-29 NOTE — Telephone Encounter (Signed)
gv and printed appt sched and avs forl pt. °

## 2012-12-29 NOTE — Addendum Note (Signed)
Addended by: Reesa Chew on: 12/29/2012 08:50 AM   Modules accepted: Medications

## 2012-12-29 NOTE — Progress Notes (Signed)
Hematology and Oncology Follow Up Visit  Russell Browning [id number] 1940/12/30 72 y.o. 12/29/2012 8:36 AM    Principle Diagnosis: This is a pleasant 72 year old gentleman with  advanced prostate cancer. He has metastatic disease to the bone. It appears to be hormone sensitive at this time. His cancer was inially diagnosed in 2000.    Prior Therapy: 1. He received radiation therapy and 2 years of Lupron, but it presumably was locally advanced prostate cancer at the time of diagnosis in 2000.  2. PSA was under control from 2001 up until the early parts of 2012, where his PSA rose up to 16.9. At that time, he was under the care of Dr. 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 He subsequently started on  Lupron every 3 months.  3. He is S/P radiation therapy directed to the right hip (30 gray in 12 fractions) concluded in 07/2011 for palliative purposes conducted in [place].    Current therapy:  1. lupron 22.5 mg every three months.  2. Rivka Barbara given every 4 weeks started in 10/2011.   Interim History:  Russell Browning presents today for a follow up visit. He is a pleasant gentleman with the diagnosis of hormone sensitive advanced prostate cancer. He is currently on Lupron every 3 months with his last PSA  was 0.03. He was complaining off right hip pain, but after radiation therapy his pain is a lot better but needing pain medications at night time. He is able to ambulate without any major difficulty his continued to perform activities of daily living without any major decline. He reports hot flashes associated with Lupron but these has improved with Effexor. No complications with Xgeva at this time. He is able to have excellent quality of life.   Medications: I have reviewed the patient's current medications.   Current Outpatient Prescriptions  Medication Sig Dispense Refill  . atenolol (TENORMIN) 50 MG tablet Take  50 mg by mouth daily. Take 1/4 tablet every day       . Calcium Carbonate-Vitamin D (CALCIUM-VITAMIN D) 500-200 MG-UNIT per tablet Take 1 tablet by mouth daily.      . clobetasol ointment (TEMOVATE) 0.05 % Apply 1 application topically daily.      . clonazePAM (KLONOPIN) 1 MG tablet Take 1 mg by mouth at bedtime.       . clopidogrel (PLAVIX) 75 MG tablet TAKE 1 TABLET BY MOUTH DAILY  30 tablet  1  . fentaNYL (DURAGESIC - DOSED MCG/HR) 25 MCG/HR Place 1 patch onto the skin every 3 (three) days.      Marland Kitchen leuprolide (LUPRON) 22.5 MG injection Inject 22.5 mg into the muscle every 3 (three) months.      . nitroGLYCERIN (NITROSTAT) 0.4 MG SL tablet Place 1 tablet (0.4 mg total) under the tongue every 5 (five) minutes as needed for chest pain.  25 tablet  3  . NON FORMULARY Inject into the muscle every 30 (thirty) days. [facility] injections.      Marland Kitchen oxyCODONE-acetaminophen (PERCOCET) 10-325 MG per tablet Take 1 tablet by mouth 4 (four) times daily as needed.       . pantoprazole (PROTONIX) 40 MG tablet Take 40 mg by mouth daily.      . polyethylene glycol (MIRALAX / GLYCOLAX) packet Take 17 g by mouth daily.      Marland Kitchen senna-docusate (EQ SENNA-S) 8.6-50 MG per tablet Take  2 tablets by mouth daily.  60 tablet  3  . simvastatin (ZOCOR) 40 MG tablet Take 40 mg by mouth at bedtime.        Marland Kitchen venlafaxine XR (EFFEXOR-XR) 150 MG 24 hr capsule Take 150 mg by mouth daily.      . vitamin B-12 (CYANOCOBALAMIN) 1000 MCG tablet Take 1,000 mcg by mouth daily.         Current Facility-Administered Medications  Medication Dose Route Frequency Provider Last Rate Last Dose  . leuprolide (LUPRON) injection 30 mg  30 mg Intramuscular Once Benjiman Core, MD         Allergies:  Allergies  Allergen Reactions  . Penicillins   . Phenazopyridine Hcl   . Ramipril     Past Medical History, Surgical history, Social history, and Family History were reviewed and updated.  Review of Systems: Constitutional:  Negative for  fever, chills, night sweats, anorexia, weight loss, pain. Cardiovascular: no chest pain or dyspnea on exertion Respiratory: negative Neurological: negative Dermatological: negative ENT: negative Skin: Negative. Gastrointestinal: negative Genito-Urinary: negative Hematological and Lymphatic: negative Breast: negative Musculoskeletal: negative Remaining ROS negative. Physical Exam: Blood pressure 134/81, pulse 88, temperature 97.5 F (36.4 C), temperature source Oral, resp. rate 18, height 5\' 10"  (1.778 m), weight 144 lb 14.4 oz (65.726 kg). ECOG: 1 General appearance: alert Head: Normocephalic, without obvious abnormality, atraumatic Neck: no adenopathy, no carotid bruit, no JVD, supple, symmetrical, trachea midline and thyroid not enlarged, symmetric, no tenderness/mass/nodules Lymph nodes: Cervical, supraclavicular, and axillary nodes normal. Heart:regular rate and rhythm, S1, S2 normal, no murmur, click, rub or gallop Lung:chest clear, no wheezing, rales, normal symmetric air entry Abdomin: soft, non-tender, without masses or organomegaly EXT:no erythema, induration, or nodules   Lab Results: Lab Results  Component Value Date   WBC 4.9 12/29/2012   HGB 11.4* 12/29/2012   HCT 33.8* 12/29/2012   MCV 89.4 12/29/2012   PLT 151 12/29/2012   Results for Russell Browning (MRN [id number]) as of 12/29/2012 08:38  Ref. Range 06/30/2012 09:15 09/29/2012 08:19  PSA Latest Range: <=4.00 ng/mL 0.03 0.03      Impression and Plan: 72 year old gentleman with the following issues:  1. Hormone sensitive prostate cancer he is currently on Lupron with advanced disease to the bone. His PSA is under excellent control. Will give Lupron today and repeat in 3 months.   2. Bone pain: under better control. Still need little pain medications at times. Overall, his use of pain medication is a lot less.   3. Bone directed therapy: Rivka Barbara was well tolerated. He will continue his injections monthly and will  be due again on 7/30. 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   4. Follow up in 3 months.    [facility], MD 7/18/20148:36 AM

## 2013-01-03 DIAGNOSIS — I10 Essential (primary) hypertension: Secondary | ICD-10-CM | POA: Diagnosis not present

## 2013-01-03 DIAGNOSIS — G2589 Other specified extrapyramidal and movement disorders: Secondary | ICD-10-CM | POA: Diagnosis not present

## 2013-01-03 DIAGNOSIS — M6530 Trigger finger, unspecified finger: Secondary | ICD-10-CM | POA: Diagnosis not present

## 2013-01-03 DIAGNOSIS — N400 Enlarged prostate without lower urinary tract symptoms: Secondary | ICD-10-CM | POA: Diagnosis not present

## 2013-01-03 DIAGNOSIS — IMO0001 Reserved for inherently not codable concepts without codable children: Secondary | ICD-10-CM | POA: Diagnosis not present

## 2013-01-03 DIAGNOSIS — E782 Mixed hyperlipidemia: Secondary | ICD-10-CM | POA: Diagnosis not present

## 2013-01-10 ENCOUNTER — Ambulatory Visit (HOSPITAL_BASED_OUTPATIENT_CLINIC_OR_DEPARTMENT_OTHER): Payer: Medicare Other

## 2013-01-10 ENCOUNTER — Other Ambulatory Visit (HOSPITAL_BASED_OUTPATIENT_CLINIC_OR_DEPARTMENT_OTHER): Payer: Medicare Other | Admitting: Lab

## 2013-01-10 VITALS — BP 112/64 | HR 67 | Temp 98.1°F

## 2013-01-10 DIAGNOSIS — I495 Sick sinus syndrome: Secondary | ICD-10-CM

## 2013-01-10 DIAGNOSIS — G459 Transient cerebral ischemic attack, unspecified: Secondary | ICD-10-CM

## 2013-01-10 DIAGNOSIS — I259 Chronic ischemic heart disease, unspecified: Secondary | ICD-10-CM

## 2013-01-10 DIAGNOSIS — C7952 Secondary malignant neoplasm of bone marrow: Secondary | ICD-10-CM

## 2013-01-10 DIAGNOSIS — T82110A Breakdown (mechanical) of cardiac electrode, initial encounter: Secondary | ICD-10-CM

## 2013-01-10 DIAGNOSIS — C61 Malignant neoplasm of prostate: Secondary | ICD-10-CM

## 2013-01-10 DIAGNOSIS — C7951 Secondary malignant neoplasm of bone: Secondary | ICD-10-CM | POA: Diagnosis not present

## 2013-01-10 DIAGNOSIS — Z950 Presence of cardiac pacemaker: Secondary | ICD-10-CM

## 2013-01-10 DIAGNOSIS — E785 Hyperlipidemia, unspecified: Secondary | ICD-10-CM

## 2013-01-10 DIAGNOSIS — I951 Orthostatic hypotension: Secondary | ICD-10-CM

## 2013-01-10 LAB — COMPREHENSIVE METABOLIC PANEL (CC13)
ALT: 10 U/L (ref 0–55)
AST: 16 U/L (ref 5–34)
Albumin: 3.8 g/dL (ref 3.5–5.0)
Alkaline Phosphatase: 17 U/L — ABNORMAL LOW (ref 40–150)
BUN: 16.7 mg/dL (ref 7.0–26.0)
Potassium: 3.9 mEq/L (ref 3.5–5.1)
Sodium: 143 mEq/L (ref 136–145)
Total Protein: 6.4 g/dL (ref 6.4–8.3)

## 2013-01-10 MED ORDER — DENOSUMAB 120 MG/1.7ML ~~LOC~~ SOLN
120.0000 mg | Freq: Once | SUBCUTANEOUS | Status: AC
  Administered 2013-01-10: 120 mg via SUBCUTANEOUS
  Filled 2013-01-10: qty 1.7

## 2013-02-09 ENCOUNTER — Other Ambulatory Visit (HOSPITAL_BASED_OUTPATIENT_CLINIC_OR_DEPARTMENT_OTHER): Payer: Medicare Other | Admitting: Lab

## 2013-02-09 ENCOUNTER — Ambulatory Visit (HOSPITAL_BASED_OUTPATIENT_CLINIC_OR_DEPARTMENT_OTHER): Payer: Medicare Other

## 2013-02-09 VITALS — BP 123/72 | HR 66 | Temp 98.0°F

## 2013-02-09 DIAGNOSIS — C7951 Secondary malignant neoplasm of bone: Secondary | ICD-10-CM

## 2013-02-09 DIAGNOSIS — C61 Malignant neoplasm of prostate: Secondary | ICD-10-CM

## 2013-02-09 LAB — CBC WITH DIFFERENTIAL/PLATELET
Basophils Absolute: 0 10*3/uL (ref 0.0–0.1)
EOS%: 4.4 % (ref 0.0–7.0)
HCT: 35.5 % — ABNORMAL LOW (ref 38.4–49.9)
HGB: 11.8 g/dL — ABNORMAL LOW (ref 13.0–17.1)
MCH: 29.9 pg (ref 27.2–33.4)
MONO#: 0.4 10*3/uL (ref 0.1–0.9)
NEUT%: 58.2 % (ref 39.0–75.0)
lymph#: 1.2 10*3/uL (ref 0.9–3.3)

## 2013-02-09 LAB — PSA: PSA: 0.04 ng/mL (ref <=4.00)

## 2013-02-09 LAB — COMPREHENSIVE METABOLIC PANEL (CC13)
AST: 17 U/L (ref 5–34)
BUN: 13.7 mg/dL (ref 7.0–26.0)
Calcium: 9.1 mg/dL (ref 8.4–10.4)
Chloride: 108 mEq/L (ref 98–109)
Creatinine: 1 mg/dL (ref 0.7–1.3)
Total Bilirubin: 0.34 mg/dL (ref 0.20–1.20)

## 2013-02-09 MED ORDER — DENOSUMAB 120 MG/1.7ML ~~LOC~~ SOLN
120.0000 mg | Freq: Once | SUBCUTANEOUS | Status: AC
  Administered 2013-02-09: 120 mg via SUBCUTANEOUS
  Filled 2013-02-09: qty 1.7

## 2013-02-13 ENCOUNTER — Ambulatory Visit (INDEPENDENT_AMBULATORY_CARE_PROVIDER_SITE_OTHER): Payer: Medicare Other | Admitting: *Deleted

## 2013-02-13 DIAGNOSIS — Z950 Presence of cardiac pacemaker: Secondary | ICD-10-CM

## 2013-02-13 DIAGNOSIS — I495 Sick sinus syndrome: Secondary | ICD-10-CM

## 2013-02-13 NOTE — Progress Notes (Signed)
PPM device check in clinic. Pt misunderstood letter to transmit today, came here to [place]. All functions normal (aside from known Atrial lead impedance), no changes made, full details in paceart.  ROV w/ Dr. Randal Buba Jan/2015.

## 2013-02-15 LAB — PACEMAKER DEVICE OBSERVATION
AL AMPLITUDE: 0.7 mv
AL IMPEDENCE PM: 160 Ohm
AL THRESHOLD: 1 V
RV LEAD AMPLITUDE: 11.2 mv
RV LEAD IMPEDENCE PM: 1011 Ohm
RV LEAD THRESHOLD: 1.5 V

## 2013-02-19 ENCOUNTER — Encounter: Payer: Self-pay | Admitting: *Deleted

## 2013-02-26 ENCOUNTER — Encounter: Payer: Self-pay | Admitting: Internal Medicine

## 2013-03-05 ENCOUNTER — Telehealth: Payer: Self-pay | Admitting: *Deleted

## 2013-03-05 NOTE — Telephone Encounter (Signed)
Patient called reporting he has not been scheduled for his September Xgeva injection.  Also mentions he receives Lupron.  Next appointment is 04-04-2013 for lab and MD f/u.  He can be reached at [phone] with an appointment.    Will leave request for provider for clarification.  Rivka Barbara notes read every six weeks and perhaps will be due on March 23, 2013.  Lupron is every three months and will be due March 31, 2013.

## 2013-03-21 DIAGNOSIS — L0201 Cutaneous abscess of face: Secondary | ICD-10-CM | POA: Diagnosis not present

## 2013-03-21 DIAGNOSIS — Z23 Encounter for immunization: Secondary | ICD-10-CM | POA: Diagnosis not present

## 2013-04-03 ENCOUNTER — Other Ambulatory Visit: Payer: Self-pay | Admitting: Oncology

## 2013-04-03 DIAGNOSIS — C61 Malignant neoplasm of prostate: Secondary | ICD-10-CM

## 2013-04-04 ENCOUNTER — Ambulatory Visit (HOSPITAL_BASED_OUTPATIENT_CLINIC_OR_DEPARTMENT_OTHER): Payer: Medicare Other | Admitting: Oncology

## 2013-04-04 ENCOUNTER — Telehealth: Payer: Self-pay | Admitting: Oncology

## 2013-04-04 ENCOUNTER — Other Ambulatory Visit (HOSPITAL_BASED_OUTPATIENT_CLINIC_OR_DEPARTMENT_OTHER): Payer: Medicare Other | Admitting: Lab

## 2013-04-04 VITALS — BP 140/78 | HR 87 | Temp 97.7°F | Resp 18 | Wt 137.5 lb

## 2013-04-04 DIAGNOSIS — M899 Disorder of bone, unspecified: Secondary | ICD-10-CM

## 2013-04-04 DIAGNOSIS — I951 Orthostatic hypotension: Secondary | ICD-10-CM

## 2013-04-04 DIAGNOSIS — C61 Malignant neoplasm of prostate: Secondary | ICD-10-CM

## 2013-04-04 DIAGNOSIS — I495 Sick sinus syndrome: Secondary | ICD-10-CM

## 2013-04-04 DIAGNOSIS — Z950 Presence of cardiac pacemaker: Secondary | ICD-10-CM

## 2013-04-04 DIAGNOSIS — C7951 Secondary malignant neoplasm of bone: Secondary | ICD-10-CM

## 2013-04-04 DIAGNOSIS — T82110A Breakdown (mechanical) of cardiac electrode, initial encounter: Secondary | ICD-10-CM

## 2013-04-04 DIAGNOSIS — G459 Transient cerebral ischemic attack, unspecified: Secondary | ICD-10-CM

## 2013-04-04 DIAGNOSIS — E785 Hyperlipidemia, unspecified: Secondary | ICD-10-CM

## 2013-04-04 DIAGNOSIS — Z5111 Encounter for antineoplastic chemotherapy: Secondary | ICD-10-CM

## 2013-04-04 DIAGNOSIS — I259 Chronic ischemic heart disease, unspecified: Secondary | ICD-10-CM

## 2013-04-04 LAB — CBC WITH DIFFERENTIAL/PLATELET
BASO%: 0.6 % (ref 0.0–2.0)
HCT: 34.6 % — ABNORMAL LOW (ref 38.4–49.9)
LYMPH%: 30.8 % (ref 14.0–49.0)
MCHC: 33.4 g/dL (ref 32.0–36.0)
MCV: 89 fL (ref 79.3–98.0)
MONO#: 0.5 10*3/uL (ref 0.1–0.9)
MONO%: 8.9 % (ref 0.0–14.0)
NEUT%: 56.3 % (ref 39.0–75.0)
Platelets: 151 10*3/uL (ref 140–400)
RBC: 3.89 10*6/uL — ABNORMAL LOW (ref 4.20–5.82)
WBC: 5.5 10*3/uL (ref 4.0–10.3)

## 2013-04-04 LAB — COMPREHENSIVE METABOLIC PANEL (CC13)
ALT: 12 U/L (ref 0–55)
Alkaline Phosphatase: 19 U/L — ABNORMAL LOW (ref 40–150)
Anion Gap: 12 mEq/L — ABNORMAL HIGH (ref 3–11)
CO2: 27 mEq/L (ref 22–29)
Creatinine: 1 mg/dL (ref 0.7–1.3)
Glucose: 81 mg/dl (ref 70–140)
Sodium: 143 mEq/L (ref 136–145)
Total Bilirubin: 0.31 mg/dL (ref 0.20–1.20)

## 2013-04-04 LAB — PSA: PSA: 0.05 ng/mL (ref <=4.00)

## 2013-04-04 MED ORDER — LEUPROLIDE ACETATE (4 MONTH) 30 MG IM KIT
30.0000 mg | PACK | Freq: Once | INTRAMUSCULAR | Status: AC
  Administered 2013-04-04: 30 mg via INTRAMUSCULAR
  Filled 2013-04-04: qty 30

## 2013-04-04 NOTE — Progress Notes (Signed)
Hematology and Oncology Follow Up Visit  Russell Browning [id number] [birth year] 72 y.o. 04/04/2013 8:38 AM    Principle Diagnosis: This is a pleasant 72 year old gentleman with  advanced prostate cancer. He has metastatic disease to the bone. It appears to be hormone sensitive at this time. His cancer was inially diagnosed in 2000.    Prior Therapy: 1. He received radiation therapy and 2 years of Lupron, but it presumably was locally advanced prostate cancer at the time of diagnosis in 2000.  2. PSA was under control from 2001 up until the early parts of 2012, where his PSA rose up to 16.9. At that time, he was under the care of Dr. 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 He subsequently started on  Lupron every 3 months.  3. He is S/P radiation therapy directed to the right hip (30 gray in 12 fractions) concluded in 07/2011 for palliative purposes conducted in [place].    Current therapy:  1. lupron 22.5 mg every three months.  2. Rivka Barbara given every 4 to 6 weeks started in 10/2011.   Interim History:  Russell Browning presents today for a follow up visit. He is a pleasant gentleman with the diagnosis of hormone sensitive advanced prostate cancer. He is currently on Lupron every 3 months with his last PSA  was 0.04. He was complaining off right hip pain, but after radiation therapy his pain is a lot better but needing pain medications at night time. He is able to ambulate without any major difficulty his continued to perform activities of daily living without any major decline. He reports hot flashes associated with Lupron but these has improved with Effexor. No complications with Xgeva at this time. He is able to have excellent quality of life. He reports loosing some weight since last visit. He eats well overall.   Medications: I have reviewed the patient's current medications.   Current Outpatient  Prescriptions  Medication Sig Dispense Refill  . atenolol (TENORMIN) 50 MG tablet Take 50 mg by mouth daily. Take 1/4 tablet every day       . Calcium Carbonate-Vitamin D (CALCIUM-VITAMIN D) 500-200 MG-UNIT per tablet Take 1 tablet by mouth daily.      . clobetasol ointment (TEMOVATE) 0.05 % Apply 1 application topically daily.      . clonazePAM (KLONOPIN) 1 MG tablet Take 1 mg by mouth at bedtime.       . clopidogrel (PLAVIX) 75 MG tablet TAKE 1 TABLET BY MOUTH DAILY  30 tablet  1  . fentaNYL (DURAGESIC - DOSED MCG/HR) 25 MCG/HR Place 1 patch onto the skin every 3 (three) days.      Marland Kitchen leuprolide (LUPRON) 22.5 MG injection Inject 22.5 mg into the muscle every 3 (three) months.      . nitroGLYCERIN (NITROSTAT) 0.4 MG SL tablet Place 1 tablet (0.4 mg total) under the tongue every 5 (five) minutes as needed for chest pain.  25 tablet  3  . NON FORMULARY Inject into the muscle every 30 (thirty) days. [facility] injections.      Marland Kitchen oxyCODONE-acetaminophen (PERCOCET) 10-325 MG per tablet Take 1 tablet by mouth 4 (four) times daily as needed.       . pantoprazole (PROTONIX) 40 MG tablet Take 40 mg by mouth daily.      . polyethylene glycol (MIRALAX / GLYCOLAX) packet Take 17 g by mouth daily.      Marland Kitchen  senna-docusate (EQ SENNA-S) 8.6-50 MG per tablet Take 2 tablets by mouth daily.  60 tablet  3  . simvastatin (ZOCOR) 40 MG tablet Take 40 mg by mouth at bedtime.        Marland Kitchen venlafaxine XR (EFFEXOR-XR) 150 MG 24 hr capsule Take 150 mg by mouth daily.      . vitamin B-12 (CYANOCOBALAMIN) 1000 MCG tablet Take 1,000 mcg by mouth daily.         No current facility-administered medications for this visit.     Allergies:  Allergies  Allergen Reactions  . Penicillins   . Phenazopyridine Hcl   . Ramipril     Past Medical History, Surgical history, Social history, and Family History were reviewed and updated.  Review of Systems:  Remaining ROS negative. Physical Exam: There were no vitals taken for  this visit. ECOG: 1 General appearance: alert Head: Normocephalic, without obvious abnormality, atraumatic Neck: no adenopathy, no carotid bruit, no JVD, supple, symmetrical, trachea midline and thyroid not enlarged, symmetric, no tenderness/mass/nodules Lymph nodes: Cervical, supraclavicular, and axillary nodes normal. Heart:regular rate and rhythm, S1, S2 normal, no murmur, click, rub or gallop Lung:chest clear, no wheezing, rales, normal symmetric air entry Abdomin: soft, non-tender, without masses or organomegaly EXT:no erythema, induration, or nodules   Lab Results: Lab Results  Component Value Date   WBC 5.5 04/04/2013   HGB 11.6* 04/04/2013   HCT 34.6* 04/04/2013   MCV 89.0 04/04/2013   PLT 151 04/04/2013    Results for [employer] (MRN [id number]) as of 04/04/2013 08:39  Ref. Range 12/29/2012 08:09 02/09/2013 08:10  PSA Latest Range: <=4.00 ng/mL 0.04 0.04      Impression and Plan: 72 year old gentleman with the following issues:  1. Hormone sensitive prostate cancer he is currently on Lupron with advanced disease to the bone. His PSA is under excellent control. Will give Lupron today and repeat in 3 months.   2. Bone pain: under better control. Still need little pain medications at times. Overall, his use of pain medication is a lot less.   3. Bone directed therapy: Rivka Barbara was well tolerated. He will continue his injections every 6 week.   4. Follow up in 3 months.    Eli Hose, MD 10/22/20148:38 AM

## 2013-04-04 NOTE — Telephone Encounter (Signed)
gv and printed appt sched and avs for pt for OCT, Dec adn Jan 2015

## 2013-04-06 DIAGNOSIS — N400 Enlarged prostate without lower urinary tract symptoms: Secondary | ICD-10-CM | POA: Diagnosis not present

## 2013-04-06 DIAGNOSIS — I10 Essential (primary) hypertension: Secondary | ICD-10-CM | POA: Diagnosis not present

## 2013-04-06 DIAGNOSIS — E782 Mixed hyperlipidemia: Secondary | ICD-10-CM | POA: Diagnosis not present

## 2013-04-06 DIAGNOSIS — Z1331 Encounter for screening for depression: Secondary | ICD-10-CM | POA: Diagnosis not present

## 2013-04-06 DIAGNOSIS — G2589 Other specified extrapyramidal and movement disorders: Secondary | ICD-10-CM | POA: Diagnosis not present

## 2013-04-06 DIAGNOSIS — IMO0001 Reserved for inherently not codable concepts without codable children: Secondary | ICD-10-CM | POA: Diagnosis not present

## 2013-04-09 ENCOUNTER — Ambulatory Visit (HOSPITAL_BASED_OUTPATIENT_CLINIC_OR_DEPARTMENT_OTHER): Payer: Medicare Other

## 2013-04-09 VITALS — BP 115/56 | HR 66 | Temp 98.2°F

## 2013-04-09 DIAGNOSIS — C7951 Secondary malignant neoplasm of bone: Secondary | ICD-10-CM | POA: Diagnosis not present

## 2013-04-09 DIAGNOSIS — C61 Malignant neoplasm of prostate: Secondary | ICD-10-CM

## 2013-04-09 MED ORDER — DENOSUMAB 120 MG/1.7ML ~~LOC~~ SOLN
120.0000 mg | Freq: Once | SUBCUTANEOUS | Status: AC
  Administered 2013-04-09: 120 mg via SUBCUTANEOUS
  Filled 2013-04-09: qty 1.7

## 2013-05-21 ENCOUNTER — Ambulatory Visit (HOSPITAL_BASED_OUTPATIENT_CLINIC_OR_DEPARTMENT_OTHER): Payer: Medicare Other

## 2013-05-21 ENCOUNTER — Other Ambulatory Visit (HOSPITAL_BASED_OUTPATIENT_CLINIC_OR_DEPARTMENT_OTHER): Payer: Medicare Other | Admitting: Lab

## 2013-05-21 VITALS — BP 102/53 | HR 61 | Temp 98.1°F

## 2013-05-21 DIAGNOSIS — C7951 Secondary malignant neoplasm of bone: Secondary | ICD-10-CM

## 2013-05-21 DIAGNOSIS — T82110A Breakdown (mechanical) of cardiac electrode, initial encounter: Secondary | ICD-10-CM

## 2013-05-21 DIAGNOSIS — G459 Transient cerebral ischemic attack, unspecified: Secondary | ICD-10-CM | POA: Diagnosis not present

## 2013-05-21 DIAGNOSIS — C61 Malignant neoplasm of prostate: Secondary | ICD-10-CM | POA: Diagnosis not present

## 2013-05-21 DIAGNOSIS — I259 Chronic ischemic heart disease, unspecified: Secondary | ICD-10-CM

## 2013-05-21 DIAGNOSIS — I495 Sick sinus syndrome: Secondary | ICD-10-CM

## 2013-05-21 DIAGNOSIS — Z950 Presence of cardiac pacemaker: Secondary | ICD-10-CM

## 2013-05-21 DIAGNOSIS — E785 Hyperlipidemia, unspecified: Secondary | ICD-10-CM | POA: Diagnosis not present

## 2013-05-21 DIAGNOSIS — I951 Orthostatic hypotension: Secondary | ICD-10-CM | POA: Diagnosis not present

## 2013-05-21 DIAGNOSIS — T82190A Other mechanical complication of cardiac electrode, initial encounter: Secondary | ICD-10-CM | POA: Diagnosis not present

## 2013-05-21 LAB — COMPREHENSIVE METABOLIC PANEL (CC13)
Albumin: 4.1 g/dL (ref 3.5–5.0)
Anion Gap: 8 mEq/L (ref 3–11)
BUN: 13.3 mg/dL (ref 7.0–26.0)
CO2: 29 mEq/L (ref 22–29)
Chloride: 107 mEq/L (ref 98–109)
Glucose: 85 mg/dl (ref 70–140)
Potassium: 4.2 mEq/L (ref 3.5–5.1)
Sodium: 145 mEq/L (ref 136–145)
Total Protein: 6.6 g/dL (ref 6.4–8.3)

## 2013-05-21 LAB — PSA: PSA: 0.04 ng/mL (ref <=4.00)

## 2013-05-21 MED ORDER — DENOSUMAB 120 MG/1.7ML ~~LOC~~ SOLN
120.0000 mg | Freq: Once | SUBCUTANEOUS | Status: AC
  Administered 2013-05-21: 120 mg via SUBCUTANEOUS
  Filled 2013-05-21: qty 1.7

## 2013-06-18 ENCOUNTER — Encounter: Payer: Self-pay | Admitting: Internal Medicine

## 2013-06-18 ENCOUNTER — Ambulatory Visit (INDEPENDENT_AMBULATORY_CARE_PROVIDER_SITE_OTHER): Payer: Medicare Other | Admitting: Internal Medicine

## 2013-06-18 VITALS — BP 119/78 | HR 91 | Ht 69.0 in | Wt 136.0 lb

## 2013-06-18 DIAGNOSIS — I259 Chronic ischemic heart disease, unspecified: Secondary | ICD-10-CM

## 2013-06-18 DIAGNOSIS — I495 Sick sinus syndrome: Secondary | ICD-10-CM | POA: Diagnosis not present

## 2013-06-18 DIAGNOSIS — Z950 Presence of cardiac pacemaker: Secondary | ICD-10-CM | POA: Diagnosis not present

## 2013-06-18 DIAGNOSIS — T82110S Breakdown (mechanical) of cardiac electrode, sequela: Secondary | ICD-10-CM

## 2013-06-18 DIAGNOSIS — C61 Malignant neoplasm of prostate: Secondary | ICD-10-CM | POA: Diagnosis not present

## 2013-06-18 LAB — MDC_IDC_ENUM_SESS_TYPE_INCLINIC
Battery Impedance: 528 Ohm
Battery Remaining Longevity: 81 mo
Battery Voltage: 2.78 V
Brady Statistic AP VP Percent: 0 %
Brady Statistic AS VS Percent: 16 %
Lead Channel Impedance Value: 1115 Ohm
Lead Channel Impedance Value: 160 Ohm
Lead Channel Pacing Threshold Amplitude: 1 V
Lead Channel Pacing Threshold Pulse Width: 0.4 ms
Lead Channel Sensing Intrinsic Amplitude: 1.4 mV
Lead Channel Setting Pacing Amplitude: 2 V
Lead Channel Setting Pacing Amplitude: 2.75 V
Lead Channel Setting Sensing Sensitivity: 4 mV
MDC IDC MSMT LEADCHNL RA PACING THRESHOLD PULSEWIDTH: 0.4 ms
MDC IDC MSMT LEADCHNL RV PACING THRESHOLD AMPLITUDE: 1 V
MDC IDC MSMT LEADCHNL RV SENSING INTR AMPL: 11.2 mV
MDC IDC SESS DTM: 20150105090247
MDC IDC SET LEADCHNL RV PACING PULSEWIDTH: 0.4 ms
MDC IDC STAT BRADY AP VS PERCENT: 84 %
MDC IDC STAT BRADY AS VP PERCENT: 0 %

## 2013-06-18 NOTE — Patient Instructions (Signed)
Your physician wants you to follow up in:  1 year.  You will receive a reminder letter in the mail one-two months in advance.  If you don't receive a letter, please call our office to schedule the follow up appointment - [place] Your physician wants you to follow up in: 6 months.  You will receive a reminder letter in the mail one-two months in advance.  If you don't receive a letter, please call our office to schedule the follow up appointment - Domenic [facility] remote check 09/19/2013 Continue all current medications.

## 2013-06-18 NOTE — Progress Notes (Signed)
PCP:  Gar Ponto, MD Primary Cardiologist:  Dr Domenic Polite  The patient presents today for routine electrophysiology followup.  Since last being seen in our clinic, the patient reports doing reasonably well.  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  Today, he denies symptoms of palpitations, chest pain, shortness of breath, orthopnea, dizziness, presyncope, syncope, or neurologic sequela.  The patient feels that he is tolerating medications without difficulties and is otherwise without complaint today.   Past Medical History  Diagnosis Date  . Ischemic     tansient ischemic attack  . Dyslipidemia   . Sinus node dysfunction     Status post pacemaker implantation and generator replacement, atrial lead insulation defect noted at time of generator change by Dr Caryl Comes 2008  . Single vessel coronary artery disease     severe, status post Taxus at right coronary artery in 2006 and preserved left ventricular  function  . Dyslipidemia     Drug-eluting stent to the right coronary artery November 2,2011, preserved LV function  . 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 Prostate cancer metastatic to multiple sites     s/p treatment, bone mets recently discovered   Past Surgical History  Procedure Laterality Date  . Pacemaker insertion      MDT    Current Outpatient Prescriptions  Medication Sig Dispense Refill  . atenolol (TENORMIN) 50 MG tablet Take 50 mg by mouth daily. Take 1/4 tablet every day       . Calcium Carbonate-Vitamin D (CALCIUM-VITAMIN D) 500-200 MG-UNIT per tablet Take 1 tablet by mouth daily.      . clobetasol ointment (TEMOVATE) 0.08 % Apply 1 application topically daily.      . clonazePAM (KLONOPIN) 1 MG tablet Take 1 mg by mouth at bedtime.       . clopidogrel (PLAVIX) 75 MG tablet TAKE 1 TABLET BY MOUTH DAILY  30 tablet  1  . fentaNYL  (DURAGESIC - DOSED MCG/HR) 25 MCG/HR Place 1 patch onto the skin every 3 (three) days.      Marland Kitchen leuprolide (LUPRON) 22.5 MG injection Inject 22.5 mg into the muscle every 3 (three) months.      . nitroGLYCERIN (NITROSTAT) 0.4 MG SL tablet Place 1 tablet (0.4 mg total) under the tongue every 5 (five) minutes as needed for chest pain.  25 tablet  3  . NON FORMULARY Inject into the muscle every 30 (thirty) days. [facility] injections.      Marland Kitchen oxyCODONE-acetaminophen (PERCOCET) 10-325 MG per tablet Take 1 tablet by mouth 4 (four) times daily as needed.       . pantoprazole (PROTONIX) 40 MG tablet Take 40 mg by mouth daily.      . polyethylene glycol (MIRALAX / GLYCOLAX) packet Take 17 g by mouth daily.      Marland Kitchen senna-docusate (EQ SENNA-S) 8.6-50 MG per tablet Take 2 tablets by mouth daily.  60 tablet  3  . simvastatin (ZOCOR) 40 MG tablet Take 40 mg by mouth at bedtime.        Marland Kitchen venlafaxine XR (EFFEXOR-XR) 150 MG 24 hr capsule Take 150 mg by mouth daily.      . vitamin B-12 (CYANOCOBALAMIN) 1000 MCG tablet Take 1,000 mcg by mouth daily.         No current facility-administered medications for this visit.    Allergies  Allergen Reactions  .  Penicillins   . Phenazopyridine Hcl   . Ramipril     History   Social History  . Marital Status: Married    Spouse Name: N/A    Number of Children: N/A  . Years of Education: N/A   Occupational History  . Not on file.   Social History Main Topics  . Smoking status: Former Smoker -- 1.00 packs/day for 5 years    Types: Cigarettes    Quit date: 06/14/1965  . Smokeless tobacco: Former Systems developer    Types: [place] date: 06/14/1978     Comment: chewed tobacco 5 years 1PPD  . Alcohol Use: No  . Drug Use: No  . Sexual Activity: Not on file   Other Topics Concern  . Not on file   Social History Narrative  . No narrative on file    Physical Exam: Filed Vitals:   06/18/13 0836  BP: 119/78  Pulse: 91  Height: 5\' 9"  (1.753 m)  Weight: 136 lb  (61.689 kg)    GEN- The patient is well appearing, alert and oriented x 3 today.   Head- normocephalic, atraumatic Eyes-  Sclera clear, conjunctiva pink Ears- hearing intact Oropharynx- clear Neck- supple, no JVP Lungs- Clear to ausculation bilaterally, normal work of breathing Chest- pacemaker pocket is well healed Heart- Regular rate and rhythm, no murmurs, rubs or gallops, PMI not laterally displaced GI- soft, NT, ND, + BS Extremities- no clubbing, cyanosis, or edema  Pacemaker interrogation- reviewed in detail today,  See PACEART report  Assessment and Plan:  1. Sick sinus syndrome Atrial lead impedance is chronically low.  Battery status remains good. No changes at this time  2. CAD No ischemic symptoms No changes  Return to the device clinic in 1year [facility] every 3 months Follow-up with Dr Domenic Polite in 6 months

## 2013-06-19 ENCOUNTER — Telehealth: Payer: Self-pay | Admitting: Oncology

## 2013-06-19 ENCOUNTER — Ambulatory Visit (HOSPITAL_BASED_OUTPATIENT_CLINIC_OR_DEPARTMENT_OTHER): Payer: Medicare Other | Admitting: Oncology

## 2013-06-19 ENCOUNTER — Other Ambulatory Visit (HOSPITAL_BASED_OUTPATIENT_CLINIC_OR_DEPARTMENT_OTHER): Payer: Medicare Other

## 2013-06-19 VITALS — BP 109/67 | HR 90 | Temp 97.5°F | Resp 18 | Ht 69.0 in | Wt 138.7 lb

## 2013-06-19 DIAGNOSIS — I495 Sick sinus syndrome: Secondary | ICD-10-CM | POA: Diagnosis not present

## 2013-06-19 DIAGNOSIS — C7952 Secondary malignant neoplasm of bone marrow: Secondary | ICD-10-CM | POA: Diagnosis not present

## 2013-06-19 DIAGNOSIS — T82110A Breakdown (mechanical) of cardiac electrode, initial encounter: Secondary | ICD-10-CM

## 2013-06-19 DIAGNOSIS — Z5111 Encounter for antineoplastic chemotherapy: Secondary | ICD-10-CM | POA: Diagnosis not present

## 2013-06-19 DIAGNOSIS — C7951 Secondary malignant neoplasm of bone: Secondary | ICD-10-CM

## 2013-06-19 DIAGNOSIS — I259 Chronic ischemic heart disease, unspecified: Secondary | ICD-10-CM

## 2013-06-19 DIAGNOSIS — I951 Orthostatic hypotension: Secondary | ICD-10-CM

## 2013-06-19 DIAGNOSIS — Z950 Presence of cardiac pacemaker: Secondary | ICD-10-CM | POA: Diagnosis not present

## 2013-06-19 DIAGNOSIS — G459 Transient cerebral ischemic attack, unspecified: Secondary | ICD-10-CM

## 2013-06-19 DIAGNOSIS — E785 Hyperlipidemia, unspecified: Secondary | ICD-10-CM | POA: Diagnosis not present

## 2013-06-19 DIAGNOSIS — C61 Malignant neoplasm of prostate: Secondary | ICD-10-CM | POA: Diagnosis not present

## 2013-06-19 DIAGNOSIS — T82190A Other mechanical complication of cardiac electrode, initial encounter: Secondary | ICD-10-CM | POA: Diagnosis not present

## 2013-06-19 LAB — COMPREHENSIVE METABOLIC PANEL (CC13)
ALBUMIN: 3.8 g/dL (ref 3.5–5.0)
ALT: 9 U/L (ref 0–55)
ANION GAP: 9 meq/L (ref 3–11)
AST: 12 U/L (ref 5–34)
Alkaline Phosphatase: 19 U/L — ABNORMAL LOW (ref 40–150)
BUN: 13.8 mg/dL (ref 7.0–26.0)
CALCIUM: 9.2 mg/dL (ref 8.4–10.4)
CHLORIDE: 108 meq/L (ref 98–109)
CO2: 26 meq/L (ref 22–29)
Creatinine: 1 mg/dL (ref 0.7–1.3)
GLUCOSE: 72 mg/dL (ref 70–140)
POTASSIUM: 3.9 meq/L (ref 3.5–5.1)
SODIUM: 143 meq/L (ref 136–145)
TOTAL PROTEIN: 6.4 g/dL (ref 6.4–8.3)
Total Bilirubin: 0.32 mg/dL (ref 0.20–1.20)

## 2013-06-19 LAB — CBC WITH DIFFERENTIAL/PLATELET
BASO%: 0.6 % (ref 0.0–2.0)
Basophils Absolute: 0 10*3/uL (ref 0.0–0.1)
EOS ABS: 0.2 10*3/uL (ref 0.0–0.5)
EOS%: 4.1 % (ref 0.0–7.0)
HCT: 33.2 % — ABNORMAL LOW (ref 38.4–49.9)
HGB: 11.4 g/dL — ABNORMAL LOW (ref 13.0–17.1)
LYMPH#: 1.4 10*3/uL (ref 0.9–3.3)
LYMPH%: 26.6 % (ref 14.0–49.0)
MCH: 30.6 pg (ref 27.2–33.4)
MCHC: 34.3 g/dL (ref 32.0–36.0)
MCV: 89.3 fL (ref 79.3–98.0)
MONO#: 0.5 10*3/uL (ref 0.1–0.9)
MONO%: 9.3 % (ref 0.0–14.0)
NEUT%: 59.4 % (ref 39.0–75.0)
NEUTROS ABS: 3 10*3/uL (ref 1.5–6.5)
PLATELETS: 149 10*3/uL (ref 140–400)
RBC: 3.72 10*6/uL — AB (ref 4.20–5.82)
RDW: 13.8 % (ref 11.0–14.6)
WBC: 5.1 10*3/uL (ref 4.0–10.3)

## 2013-06-19 LAB — PSA: PSA: 0.05 ng/mL (ref <=4.00)

## 2013-06-19 MED ORDER — LEUPROLIDE ACETATE (4 MONTH) 30 MG IM KIT
30.0000 mg | PACK | Freq: Once | INTRAMUSCULAR | Status: AC
  Administered 2013-06-19: 30 mg via INTRAMUSCULAR
  Filled 2013-06-19: qty 30

## 2013-06-19 NOTE — Telephone Encounter (Signed)
gv and rinted appt sched and avs for pt for all appts

## 2013-06-19 NOTE — Progress Notes (Signed)
Hematology and Oncology Follow Up Visit  Russell Browning [id number] 1941/04/15 73 y.o. 06/19/2013 9:15 AM    Principle Diagnosis: This is a pleasant 73 year old gentleman with  advanced prostate cancer. He has metastatic disease to the bone. It appears to be hormone sensitive at this time. His cancer was inially diagnosed in 2000.    Prior Therapy: 1. He received radiation therapy and 2 years of Lupron, but it presumably was locally advanced prostate cancer at the time of diagnosis in 2000.  2. PSA was under control from 2001 up until the early parts of 2012, where his PSA rose up to 16.9. At that time, he was under the care of Dr. 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 He subsequently started on  Lupron every 3 months.  3. He is S/P radiation therapy directed to the right hip (30 gray in 12 fractions) concluded in 07/2011 for palliative purposes conducted in [place].    Current therapy:  1. lupron 22.5 mg every three months.  2. Delton See given every 4 to 6 weeks started in 10/2011.   Interim History:  Russell Browning presents today for a follow up visit. He is a pleasant gentleman with the diagnosis of hormone sensitive advanced prostate cancer. He is currently on Lupron every 3 months with his last PSA  was 0.04. He was complaining off right leg pain that has been chronic but needing pain medications at night time. He is able to ambulate without any major difficulty his continued to perform activities of daily living without any major decline. He reports hot flashes associated with Lupron but these has improved with Effexor. No complications with Xgeva at this time. He is able to have excellent quality of life. He reports loosing some weight since last visit. He has not reported any major changes with his performance status and activity level. His continued to have excellent quality of the life.  Medications: I  have reviewed the patient's current medications.   Current Outpatient Prescriptions  Medication Sig Dispense Refill  . atenolol (TENORMIN) 50 MG tablet Take 50 mg by mouth daily. Take 1/4 tablet every day       . Calcium Carbonate-Vitamin D (CALCIUM-VITAMIN D) 500-200 MG-UNIT per tablet Take 1 tablet by mouth daily.      . clobetasol ointment (TEMOVATE) 2.50 % Apply 1 application topically daily.      . clonazePAM (KLONOPIN) 1 MG tablet Take 1 mg by mouth at bedtime.       . clopidogrel (PLAVIX) 75 MG tablet TAKE 1 TABLET BY MOUTH DAILY  30 tablet  1  . fentaNYL (DURAGESIC - DOSED MCG/HR) 25 MCG/HR Place 1 patch onto the skin every 3 (three) days.      Marland Kitchen leuprolide (LUPRON) 22.5 MG injection Inject 22.5 mg into the muscle every 3 (three) months.      . nitroGLYCERIN (NITROSTAT) 0.4 MG SL tablet Place 1 tablet (0.4 mg total) under the tongue every 5 (five) minutes as needed for chest pain.  25 tablet  3  . NON FORMULARY Inject into the muscle every 30 (thirty) days. [facility] injections.      Marland Kitchen oxyCODONE-acetaminophen (PERCOCET) 10-325 MG per tablet Take 1 tablet by mouth 4 (four) times daily as needed.       . pantoprazole (PROTONIX) 40 MG tablet Take 40 mg by mouth daily.      Marland Kitchen  polyethylene glycol (MIRALAX / GLYCOLAX) packet Take 17 g by mouth daily.      Marland Kitchen senna-docusate (EQ SENNA-S) 8.6-50 MG per tablet Take 2 tablets by mouth daily.  60 tablet  3  . simvastatin (ZOCOR) 40 MG tablet Take 40 mg by mouth at bedtime.        Marland Kitchen venlafaxine XR (EFFEXOR-XR) 150 MG 24 hr capsule Take 150 mg by mouth daily.      . vitamin B-12 (CYANOCOBALAMIN) 1000 MCG tablet Take 1,000 mcg by mouth daily.         Current Facility-Administered Medications  Medication Dose Route Frequency Provider Last Rate Last Dose  . leuprolide (LUPRON) injection 30 mg  30 mg Intramuscular Once Wyatt Portela, MD         Allergies:  Allergies  Allergen Reactions  . Penicillins   . Phenazopyridine Hcl   . Ramipril      Past Medical History, Surgical history, Social history, and Family History were reviewed and updated.  Review of Systems:  Remaining ROS negative. Physical Exam: Blood pressure 109/67, pulse 90, temperature 97.5 F (36.4 C), temperature source Oral, resp. rate 18, height 5\' 9"  (1.753 m), weight 138 lb 11.2 oz (62.914 kg). ECOG: 1 General appearance: alert Head: Normocephalic, without obvious abnormality, atraumatic Neck: no adenopathy, no carotid bruit, no JVD, supple, symmetrical, trachea midline and thyroid not enlarged, symmetric, no tenderness/mass/nodules Lymph nodes: Cervical, supraclavicular, and axillary nodes normal. Heart:regular rate and rhythm, S1, S2 normal, no murmur, click, rub or gallop Lung:chest clear, no wheezing, rales, normal symmetric air entry Abdomin: soft, non-tender, without masses or organomegaly EXT:no erythema, induration, or nodules   Lab Results: Lab Results  Component Value Date   WBC 5.1 06/19/2013   HGB 11.4* 06/19/2013   HCT 33.2* 06/19/2013   MCV 89.3 06/19/2013   PLT 149 06/19/2013     Results for [employer] (MRN [id number]) as of 06/19/2013 09:16  Ref. Range 04/04/2013 08:10 05/21/2013 08:21  PSA Latest Range: <=4.00 ng/mL 0.05 0.04      Impression and Plan: 73 year old gentleman with the following issues:  1. Hormone sensitive prostate cancer he is currently on Lupron with advanced disease to the bone. His PSA is under excellent control. Will give Lupron today and repeat in 3 months.   2. Bone pain: under better control. Still need little pain medications at times. His PSA is under excellent control and I doubt that his cancer is in worse at this time.  3. Bone directed therapy: Delton See was well tolerated. He will continue his injections every 6 week.   4. Follow up in 3 months.    [facility] MD 1/6/20159:15 AM

## 2013-06-22 ENCOUNTER — Encounter: Payer: Self-pay | Admitting: Oncology

## 2013-06-25 DIAGNOSIS — R509 Fever, unspecified: Secondary | ICD-10-CM | POA: Diagnosis not present

## 2013-06-25 DIAGNOSIS — R05 Cough: Secondary | ICD-10-CM | POA: Diagnosis not present

## 2013-06-25 DIAGNOSIS — C61 Malignant neoplasm of prostate: Secondary | ICD-10-CM | POA: Diagnosis not present

## 2013-06-25 DIAGNOSIS — R059 Cough, unspecified: Secondary | ICD-10-CM | POA: Diagnosis not present

## 2013-07-02 ENCOUNTER — Ambulatory Visit: Payer: Medicare Other

## 2013-07-02 DIAGNOSIS — J40 Bronchitis, not specified as acute or chronic: Secondary | ICD-10-CM | POA: Diagnosis not present

## 2013-07-03 DIAGNOSIS — K2100 Gastro-esophageal reflux disease with esophagitis, without bleeding: Secondary | ICD-10-CM | POA: Diagnosis not present

## 2013-07-03 DIAGNOSIS — E782 Mixed hyperlipidemia: Secondary | ICD-10-CM | POA: Diagnosis not present

## 2013-07-03 DIAGNOSIS — I10 Essential (primary) hypertension: Secondary | ICD-10-CM | POA: Diagnosis not present

## 2013-07-04 ENCOUNTER — Encounter: Payer: Self-pay | Admitting: Medical Oncology

## 2013-07-04 DIAGNOSIS — C61 Malignant neoplasm of prostate: Secondary | ICD-10-CM

## 2013-07-05 ENCOUNTER — Other Ambulatory Visit (HOSPITAL_BASED_OUTPATIENT_CLINIC_OR_DEPARTMENT_OTHER): Payer: Medicare Other

## 2013-07-05 ENCOUNTER — Ambulatory Visit (HOSPITAL_BASED_OUTPATIENT_CLINIC_OR_DEPARTMENT_OTHER): Payer: Medicare Other

## 2013-07-05 VITALS — BP 146/77 | HR 98 | Temp 97.3°F

## 2013-07-05 DIAGNOSIS — T82110A Breakdown (mechanical) of cardiac electrode, initial encounter: Secondary | ICD-10-CM

## 2013-07-05 DIAGNOSIS — C61 Malignant neoplasm of prostate: Secondary | ICD-10-CM

## 2013-07-05 DIAGNOSIS — I951 Orthostatic hypotension: Secondary | ICD-10-CM

## 2013-07-05 DIAGNOSIS — Z950 Presence of cardiac pacemaker: Secondary | ICD-10-CM

## 2013-07-05 DIAGNOSIS — C7951 Secondary malignant neoplasm of bone: Secondary | ICD-10-CM | POA: Diagnosis not present

## 2013-07-05 DIAGNOSIS — I259 Chronic ischemic heart disease, unspecified: Secondary | ICD-10-CM

## 2013-07-05 DIAGNOSIS — E785 Hyperlipidemia, unspecified: Secondary | ICD-10-CM

## 2013-07-05 DIAGNOSIS — G459 Transient cerebral ischemic attack, unspecified: Secondary | ICD-10-CM

## 2013-07-05 DIAGNOSIS — C7952 Secondary malignant neoplasm of bone marrow: Secondary | ICD-10-CM

## 2013-07-05 DIAGNOSIS — I495 Sick sinus syndrome: Secondary | ICD-10-CM

## 2013-07-05 LAB — COMPREHENSIVE METABOLIC PANEL (CC13)
ALBUMIN: 3.9 g/dL (ref 3.5–5.0)
ALT: 16 U/L (ref 0–55)
ANION GAP: 8 meq/L (ref 3–11)
AST: 16 U/L (ref 5–34)
Alkaline Phosphatase: 19 U/L — ABNORMAL LOW (ref 40–150)
BUN: 15.9 mg/dL (ref 7.0–26.0)
CALCIUM: 9.2 mg/dL (ref 8.4–10.4)
CHLORIDE: 106 meq/L (ref 98–109)
CO2: 28 meq/L (ref 22–29)
Creatinine: 0.9 mg/dL (ref 0.7–1.3)
GLUCOSE: 98 mg/dL (ref 70–140)
POTASSIUM: 3.8 meq/L (ref 3.5–5.1)
SODIUM: 142 meq/L (ref 136–145)
TOTAL PROTEIN: 6.5 g/dL (ref 6.4–8.3)
Total Bilirubin: 0.24 mg/dL (ref 0.20–1.20)

## 2013-07-05 LAB — CBC WITH DIFFERENTIAL/PLATELET
BASO%: 0.3 % (ref 0.0–2.0)
BASOS ABS: 0 10*3/uL (ref 0.0–0.1)
EOS ABS: 0.1 10*3/uL (ref 0.0–0.5)
EOS%: 0.6 % (ref 0.0–7.0)
HCT: 34 % — ABNORMAL LOW (ref 38.4–49.9)
HEMOGLOBIN: 11.7 g/dL — AB (ref 13.0–17.1)
LYMPH#: 2.1 10*3/uL (ref 0.9–3.3)
LYMPH%: 19.6 % (ref 14.0–49.0)
MCH: 30.7 pg (ref 27.2–33.4)
MCHC: 34.4 g/dL (ref 32.0–36.0)
MCV: 89.3 fL (ref 79.3–98.0)
MONO#: 0.6 10*3/uL (ref 0.1–0.9)
MONO%: 5.5 % (ref 0.0–14.0)
NEUT%: 74 % (ref 39.0–75.0)
NEUTROS ABS: 7.9 10*3/uL — AB (ref 1.5–6.5)
Platelets: 200 10*3/uL (ref 140–400)
RBC: 3.81 10*6/uL — ABNORMAL LOW (ref 4.20–5.82)
RDW: 13.5 % (ref 11.0–14.6)
WBC: 10.7 10*3/uL — ABNORMAL HIGH (ref 4.0–10.3)

## 2013-07-05 LAB — PSA: PSA: 0.04 ng/mL (ref <=4.00)

## 2013-07-05 MED ORDER — DENOSUMAB 120 MG/1.7ML ~~LOC~~ SOLN
120.0000 mg | Freq: Once | SUBCUTANEOUS | Status: AC
  Administered 2013-07-05: 120 mg via SUBCUTANEOUS
  Filled 2013-07-05: qty 1.7

## 2013-07-05 NOTE — Patient Instructions (Signed)
Denosumab injection  What is this medicine?  DENOSUMAB (den oh sue mab) slows bone breakdown. Prolia is used to treat osteoporosis in women after menopause and in men. Xgeva is used to prevent bone fractures and other bone problems caused by cancer bone metastases. Xgeva is also used to treat giant cell tumor of the bone.  This medicine may be used for other purposes; ask your health care provider or pharmacist if you have questions.  COMMON BRAND NAME(S): Prolia, XGEVA  What should I tell my health care provider before I take this medicine?  They need to know if you have any of these conditions:  -dental disease  -eczema  -infection or history of infections  -kidney disease or on dialysis  -low blood calcium or vitamin D  -malabsorption syndrome  -scheduled to have surgery or tooth extraction  -taking medicine that contains denosumab  -thyroid or parathyroid disease  -an unusual reaction to denosumab, other medicines, foods, dyes, or preservatives  -pregnant or trying to get pregnant  -breast-feeding  How should I use this medicine?  This medicine is for injection under the skin. It is given by a health care professional in a hospital or clinic setting.  If you are getting Prolia, a special MedGuide will be given to you by the pharmacist with each prescription and refill. Be sure to read this information carefully each time.  For Prolia, talk to your pediatrician regarding the use of this medicine in children. Special care may be needed. For Xgeva, talk to your pediatrician regarding the use of this medicine in children. While this drug may be prescribed for children as young as 13 years for selected conditions, precautions do apply.  Overdosage: If you think you've taken too much of this medicine contact a poison control center or emergency room at once.  Overdosage: If you think you have taken too much of this medicine contact a poison control center or emergency room at once.  NOTE: This medicine is only for  you. Do not share this medicine with others.  What if I miss a dose?  It is important not to miss your dose. Call your doctor or health care professional if you are unable to keep an appointment.  What may interact with this medicine?  Do not take this medicine with any of the following medications:  -other medicines containing denosumab  This medicine may also interact with the following medications:  -medicines that suppress the immune system  -medicines that treat cancer  -steroid medicines like prednisone or cortisone  This list may not describe all possible interactions. Give your health care provider a list of all the medicines, herbs, non-prescription drugs, or dietary supplements you use. Also tell them if you smoke, drink alcohol, or use illegal drugs. Some items may interact with your medicine.  What should I watch for while using this medicine?  Visit your doctor or health care professional for regular checks on your progress. Your doctor or health care professional may order blood tests and other tests to see how you are doing.  Call your doctor or health care professional if you get a cold or other infection while receiving this medicine. Do not treat yourself. This medicine may decrease your body's ability to fight infection.  You should make sure you get enough calcium and vitamin D while you are taking this medicine, unless your doctor tells you not to. Discuss the foods you eat and the vitamins you take with your health care professional.    See your dentist regularly. Brush and floss your teeth as directed. Before you have any dental work done, tell your dentist you are receiving this medicine.  Do not become pregnant while taking this medicine or for 5 months after stopping it. Women should inform their doctor if they wish to become pregnant or think they might be pregnant. There is a potential for serious side effects to an unborn child. Talk to your health care professional or pharmacist for more  information.  What side effects may I notice from receiving this medicine?  Side effects that you should report to your doctor or health care professional as soon as possible:  -allergic reactions like skin rash, itching or hives, swelling of the face, lips, or tongue  -breathing problems  -chest pain  -fast, irregular heartbeat  -feeling faint or lightheaded, falls  -fever, chills, or any other sign of infection  -muscle spasms, tightening, or twitches  -numbness or tingling  -skin blisters or bumps, or is dry, peels, or red  -slow healing or unexplained pain in the mouth or jaw  -unusual bleeding or bruising  Side effects that usually do not require medical attention (Report these to your doctor or health care professional if they continue or are bothersome.):  -muscle pain  -stomach upset, gas  This list may not describe all possible side effects. Call your doctor for medical advice about side effects. You may report side effects to FDA at 1-800-FDA-1088.  Where should I keep my medicine?  This medicine is only given in a clinic, doctor's office, or other health care setting and will not be stored at home.  NOTE: This sheet is a summary. It may not cover all possible information. If you have questions about this medicine, talk to your doctor, pharmacist, or health care provider.  © 2014, [employer]. (2011-11-29 12:37:47)

## 2013-07-09 DIAGNOSIS — I10 Essential (primary) hypertension: Secondary | ICD-10-CM | POA: Diagnosis not present

## 2013-07-09 DIAGNOSIS — IMO0001 Reserved for inherently not codable concepts without codable children: Secondary | ICD-10-CM | POA: Diagnosis not present

## 2013-07-09 DIAGNOSIS — E782 Mixed hyperlipidemia: Secondary | ICD-10-CM | POA: Diagnosis not present

## 2013-07-09 DIAGNOSIS — C61 Malignant neoplasm of prostate: Secondary | ICD-10-CM | POA: Diagnosis not present

## 2013-07-09 DIAGNOSIS — Z1331 Encounter for screening for depression: Secondary | ICD-10-CM | POA: Diagnosis not present

## 2013-07-09 DIAGNOSIS — G2589 Other specified extrapyramidal and movement disorders: Secondary | ICD-10-CM | POA: Diagnosis not present

## 2013-07-09 DIAGNOSIS — N400 Enlarged prostate without lower urinary tract symptoms: Secondary | ICD-10-CM | POA: Diagnosis not present

## 2013-07-30 DIAGNOSIS — Z85828 Personal history of other malignant neoplasm of skin: Secondary | ICD-10-CM | POA: Diagnosis not present

## 2013-07-30 DIAGNOSIS — L259 Unspecified contact dermatitis, unspecified cause: Secondary | ICD-10-CM | POA: Diagnosis not present

## 2013-07-30 DIAGNOSIS — L570 Actinic keratosis: Secondary | ICD-10-CM | POA: Diagnosis not present

## 2013-08-10 ENCOUNTER — Other Ambulatory Visit: Payer: Self-pay | Admitting: *Deleted

## 2013-08-10 NOTE — Progress Notes (Signed)
Per jenna in the pharmacy, patient needs a stat c-met before  xgeva injection on 08/14/13. Lab appt made. She will  Put lab in. 

## 2013-08-13 ENCOUNTER — Ambulatory Visit (HOSPITAL_BASED_OUTPATIENT_CLINIC_OR_DEPARTMENT_OTHER): Payer: Medicare Other

## 2013-08-13 VITALS — BP 131/67 | HR 83 | Temp 98.0°F

## 2013-08-13 DIAGNOSIS — G459 Transient cerebral ischemic attack, unspecified: Secondary | ICD-10-CM

## 2013-08-13 DIAGNOSIS — Z950 Presence of cardiac pacemaker: Secondary | ICD-10-CM

## 2013-08-13 DIAGNOSIS — C7952 Secondary malignant neoplasm of bone marrow: Secondary | ICD-10-CM

## 2013-08-13 DIAGNOSIS — C7951 Secondary malignant neoplasm of bone: Secondary | ICD-10-CM

## 2013-08-13 DIAGNOSIS — C61 Malignant neoplasm of prostate: Secondary | ICD-10-CM | POA: Diagnosis not present

## 2013-08-13 DIAGNOSIS — I259 Chronic ischemic heart disease, unspecified: Secondary | ICD-10-CM

## 2013-08-13 DIAGNOSIS — E785 Hyperlipidemia, unspecified: Secondary | ICD-10-CM

## 2013-08-13 DIAGNOSIS — I951 Orthostatic hypotension: Secondary | ICD-10-CM

## 2013-08-13 DIAGNOSIS — T82110A Breakdown (mechanical) of cardiac electrode, initial encounter: Secondary | ICD-10-CM

## 2013-08-13 DIAGNOSIS — I495 Sick sinus syndrome: Secondary | ICD-10-CM

## 2013-08-13 LAB — COMPREHENSIVE METABOLIC PANEL (CC13)
ALBUMIN: 4 g/dL (ref 3.5–5.0)
ALT: 12 U/L (ref 0–55)
AST: 17 U/L (ref 5–34)
Alkaline Phosphatase: 19 U/L — ABNORMAL LOW (ref 40–150)
Anion Gap: 4 mEq/L (ref 3–11)
BILIRUBIN TOTAL: 0.2 mg/dL (ref 0.20–1.20)
BUN: 12.1 mg/dL (ref 7.0–26.0)
CO2: 29 mEq/L (ref 22–29)
Calcium: 9.2 mg/dL (ref 8.4–10.4)
Chloride: 109 mEq/L (ref 98–109)
Creatinine: 0.9 mg/dL (ref 0.7–1.3)
Glucose: 76 mg/dl (ref 70–140)
POTASSIUM: 4.5 meq/L (ref 3.5–5.1)
SODIUM: 142 meq/L (ref 136–145)
TOTAL PROTEIN: 6.4 g/dL (ref 6.4–8.3)

## 2013-08-13 MED ORDER — DENOSUMAB 120 MG/1.7ML ~~LOC~~ SOLN
120.0000 mg | Freq: Once | SUBCUTANEOUS | Status: AC
  Administered 2013-08-13: 120 mg via SUBCUTANEOUS
  Filled 2013-08-13: qty 1.7

## 2013-08-13 NOTE — Patient Instructions (Signed)
Denosumab injection  What is this medicine?  DENOSUMAB (den oh sue mab) slows bone breakdown. Prolia is used to treat osteoporosis in women after menopause and in men. Xgeva is used to prevent bone fractures and other bone problems caused by cancer bone metastases. Xgeva is also used to treat giant cell tumor of the bone.  This medicine may be used for other purposes; ask your health care provider or pharmacist if you have questions.  COMMON BRAND NAME(S): Prolia, XGEVA  What should I tell my health care provider before I take this medicine?  They need to know if you have any of these conditions:  -dental disease  -eczema  -infection or history of infections  -kidney disease or on dialysis  -low blood calcium or vitamin D  -malabsorption syndrome  -scheduled to have surgery or tooth extraction  -taking medicine that contains denosumab  -thyroid or parathyroid disease  -an unusual reaction to denosumab, other medicines, foods, dyes, or preservatives  -pregnant or trying to get pregnant  -breast-feeding  How should I use this medicine?  This medicine is for injection under the skin. It is given by a health care professional in a hospital or clinic setting.  If you are getting Prolia, a special MedGuide will be given to you by the pharmacist with each prescription and refill. Be sure to read this information carefully each time.  For Prolia, talk to your pediatrician regarding the use of this medicine in children. Special care may be needed. For Xgeva, talk to your pediatrician regarding the use of this medicine in children. While this drug may be prescribed for children as young as 13 years for selected conditions, precautions do apply.  Overdosage: If you think you've taken too much of this medicine contact a poison control center or emergency room at once.  Overdosage: If you think you have taken too much of this medicine contact a poison control center or emergency room at once.  NOTE: This medicine is only for  you. Do not share this medicine with others.  What if I miss a dose?  It is important not to miss your dose. Call your doctor or health care professional if you are unable to keep an appointment.  What may interact with this medicine?  Do not take this medicine with any of the following medications:  -other medicines containing denosumab  This medicine may also interact with the following medications:  -medicines that suppress the immune system  -medicines that treat cancer  -steroid medicines like prednisone or cortisone  This list may not describe all possible interactions. Give your health care provider a list of all the medicines, herbs, non-prescription drugs, or dietary supplements you use. Also tell them if you smoke, drink alcohol, or use illegal drugs. Some items may interact with your medicine.  What should I watch for while using this medicine?  Visit your doctor or health care professional for regular checks on your progress. Your doctor or health care professional may order blood tests and other tests to see how you are doing.  Call your doctor or health care professional if you get a cold or other infection while receiving this medicine. Do not treat yourself. This medicine may decrease your body's ability to fight infection.  You should make sure you get enough calcium and vitamin D while you are taking this medicine, unless your doctor tells you not to. Discuss the foods you eat and the vitamins you take with your health care professional.    See your dentist regularly. Brush and floss your teeth as directed. Before you have any dental work done, tell your dentist you are receiving this medicine.  Do not become pregnant while taking this medicine or for 5 months after stopping it. Women should inform their doctor if they wish to become pregnant or think they might be pregnant. There is a potential for serious side effects to an unborn child. Talk to your health care professional or pharmacist for more  information.  What side effects may I notice from receiving this medicine?  Side effects that you should report to your doctor or health care professional as soon as possible:  -allergic reactions like skin rash, itching or hives, swelling of the face, lips, or tongue  -breathing problems  -chest pain  -fast, irregular heartbeat  -feeling faint or lightheaded, falls  -fever, chills, or any other sign of infection  -muscle spasms, tightening, or twitches  -numbness or tingling  -skin blisters or bumps, or is dry, peels, or red  -slow healing or unexplained pain in the mouth or jaw  -unusual bleeding or bruising  Side effects that usually do not require medical attention (Report these to your doctor or health care professional if they continue or are bothersome.):  -muscle pain  -stomach upset, gas  This list may not describe all possible side effects. Call your doctor for medical advice about side effects. You may report side effects to FDA at 1-800-FDA-1088.  Where should I keep my medicine?  This medicine is only given in a clinic, doctor's office, or other health care setting and will not be stored at home.  NOTE: This sheet is a summary. It may not cover all possible information. If you have questions about this medicine, talk to your doctor, pharmacist, or health care provider.  © 2014, [employer]. (2011-11-29 12:37:47)

## 2013-09-11 ENCOUNTER — Ambulatory Visit (HOSPITAL_BASED_OUTPATIENT_CLINIC_OR_DEPARTMENT_OTHER): Payer: Medicare Other

## 2013-09-11 VITALS — BP 137/67 | HR 86 | Temp 97.8°F

## 2013-09-11 DIAGNOSIS — T82110A Breakdown (mechanical) of cardiac electrode, initial encounter: Secondary | ICD-10-CM

## 2013-09-11 DIAGNOSIS — C61 Malignant neoplasm of prostate: Secondary | ICD-10-CM | POA: Diagnosis not present

## 2013-09-11 DIAGNOSIS — C7952 Secondary malignant neoplasm of bone marrow: Secondary | ICD-10-CM

## 2013-09-11 DIAGNOSIS — G459 Transient cerebral ischemic attack, unspecified: Secondary | ICD-10-CM

## 2013-09-11 DIAGNOSIS — E785 Hyperlipidemia, unspecified: Secondary | ICD-10-CM

## 2013-09-11 DIAGNOSIS — I495 Sick sinus syndrome: Secondary | ICD-10-CM

## 2013-09-11 DIAGNOSIS — Z950 Presence of cardiac pacemaker: Secondary | ICD-10-CM

## 2013-09-11 DIAGNOSIS — C7951 Secondary malignant neoplasm of bone: Secondary | ICD-10-CM

## 2013-09-11 DIAGNOSIS — I259 Chronic ischemic heart disease, unspecified: Secondary | ICD-10-CM

## 2013-09-11 DIAGNOSIS — I951 Orthostatic hypotension: Secondary | ICD-10-CM

## 2013-09-11 MED ORDER — DENOSUMAB 120 MG/1.7ML ~~LOC~~ SOLN
120.0000 mg | Freq: Once | SUBCUTANEOUS | Status: AC
  Administered 2013-09-11: 120 mg via SUBCUTANEOUS
  Filled 2013-09-11: qty 1.7

## 2013-09-13 ENCOUNTER — Telehealth: Payer: Self-pay | Admitting: Oncology

## 2013-09-13 NOTE — Telephone Encounter (Signed)
per FS pt sch new time on 4/14. Cld pt to adv/pt understood

## 2013-09-19 ENCOUNTER — Encounter (INDEPENDENT_AMBULATORY_CARE_PROVIDER_SITE_OTHER): Payer: Medicare Other | Admitting: *Deleted

## 2013-09-19 ENCOUNTER — Telehealth: Payer: Self-pay | Admitting: Internal Medicine

## 2013-09-19 DIAGNOSIS — I495 Sick sinus syndrome: Secondary | ICD-10-CM

## 2013-09-19 DIAGNOSIS — Z950 Presence of cardiac pacemaker: Secondary | ICD-10-CM

## 2013-09-19 NOTE — Telephone Encounter (Signed)
Updated pt we did receive his transmission today at 10:48am.

## 2013-09-19 NOTE — Telephone Encounter (Signed)
New Message:  Pt states he is having some trouble sending in his remote transmission. Pt would like a call back from our device clinic.

## 2013-09-24 LAB — MDC_IDC_ENUM_SESS_TYPE_REMOTE
Battery Impedance: 0.16 Ohm
Battery Voltage: 2.79 V
Brady Statistic RA Percent Paced: 99 %
Brady Statistic RV Percent Paced: 0 %
Lead Channel Impedance Value: 1216 Ohm
Lead Channel Impedance Value: 342 Ohm
Lead Channel Pacing Threshold Amplitude: 1.5 V
Lead Channel Pacing Threshold Pulse Width: 0.4 ms
Lead Channel Sensing Intrinsic Amplitude: 8 mV
Lead Channel Setting Pacing Amplitude: 1.5 V
Lead Channel Setting Pacing Amplitude: 2.5 V
Lead Channel Setting Pacing Pulse Width: 0.4 ms
Lead Channel Setting Sensing Sensitivity: 5.6 mV

## 2013-09-25 ENCOUNTER — Ambulatory Visit: Payer: Medicare Other

## 2013-09-25 ENCOUNTER — Ambulatory Visit: Payer: Medicare Other | Admitting: Oncology

## 2013-09-25 ENCOUNTER — Other Ambulatory Visit: Payer: Medicare Other

## 2013-09-28 ENCOUNTER — Ambulatory Visit (HOSPITAL_BASED_OUTPATIENT_CLINIC_OR_DEPARTMENT_OTHER): Payer: Medicare Other

## 2013-09-28 ENCOUNTER — Telehealth: Payer: Self-pay | Admitting: Oncology

## 2013-09-28 ENCOUNTER — Encounter: Payer: Self-pay | Admitting: Physician Assistant

## 2013-09-28 ENCOUNTER — Other Ambulatory Visit (HOSPITAL_BASED_OUTPATIENT_CLINIC_OR_DEPARTMENT_OTHER): Payer: Medicare Other

## 2013-09-28 ENCOUNTER — Ambulatory Visit (HOSPITAL_BASED_OUTPATIENT_CLINIC_OR_DEPARTMENT_OTHER): Payer: Medicare Other | Admitting: Physician Assistant

## 2013-09-28 VITALS — BP 112/75 | HR 77 | Temp 98.3°F | Resp 19 | Ht 69.0 in | Wt 137.8 lb

## 2013-09-28 DIAGNOSIS — C7952 Secondary malignant neoplasm of bone marrow: Secondary | ICD-10-CM | POA: Diagnosis not present

## 2013-09-28 DIAGNOSIS — I951 Orthostatic hypotension: Secondary | ICD-10-CM

## 2013-09-28 DIAGNOSIS — Z950 Presence of cardiac pacemaker: Secondary | ICD-10-CM

## 2013-09-28 DIAGNOSIS — T82190A Other mechanical complication of cardiac electrode, initial encounter: Secondary | ICD-10-CM | POA: Diagnosis not present

## 2013-09-28 DIAGNOSIS — I495 Sick sinus syndrome: Secondary | ICD-10-CM

## 2013-09-28 DIAGNOSIS — T82110A Breakdown (mechanical) of cardiac electrode, initial encounter: Secondary | ICD-10-CM

## 2013-09-28 DIAGNOSIS — C61 Malignant neoplasm of prostate: Secondary | ICD-10-CM

## 2013-09-28 DIAGNOSIS — C7951 Secondary malignant neoplasm of bone: Secondary | ICD-10-CM | POA: Diagnosis not present

## 2013-09-28 DIAGNOSIS — G459 Transient cerebral ischemic attack, unspecified: Secondary | ICD-10-CM

## 2013-09-28 DIAGNOSIS — E785 Hyperlipidemia, unspecified: Secondary | ICD-10-CM

## 2013-09-28 DIAGNOSIS — Z5111 Encounter for antineoplastic chemotherapy: Secondary | ICD-10-CM | POA: Diagnosis not present

## 2013-09-28 DIAGNOSIS — I259 Chronic ischemic heart disease, unspecified: Secondary | ICD-10-CM

## 2013-09-28 LAB — COMPREHENSIVE METABOLIC PANEL (CC13)
ALT: 13 U/L (ref 0–55)
AST: 15 U/L (ref 5–34)
Albumin: 4.1 g/dL (ref 3.5–5.0)
Alkaline Phosphatase: 19 U/L — ABNORMAL LOW (ref 40–150)
Anion Gap: 9 mEq/L (ref 3–11)
BUN: 21.1 mg/dL (ref 7.0–26.0)
CALCIUM: 9.5 mg/dL (ref 8.4–10.4)
CHLORIDE: 107 meq/L (ref 98–109)
CO2: 29 mEq/L (ref 22–29)
Creatinine: 0.9 mg/dL (ref 0.7–1.3)
Glucose: 89 mg/dl (ref 70–140)
Potassium: 4.3 mEq/L (ref 3.5–5.1)
SODIUM: 145 meq/L (ref 136–145)
TOTAL PROTEIN: 6.6 g/dL (ref 6.4–8.3)
Total Bilirubin: 0.23 mg/dL (ref 0.20–1.20)

## 2013-09-28 LAB — CBC WITH DIFFERENTIAL/PLATELET
BASO%: 0.5 % (ref 0.0–2.0)
Basophils Absolute: 0 10*3/uL (ref 0.0–0.1)
EOS%: 3.6 % (ref 0.0–7.0)
Eosinophils Absolute: 0.2 10*3/uL (ref 0.0–0.5)
HEMATOCRIT: 35.8 % — AB (ref 38.4–49.9)
HGB: 11.9 g/dL — ABNORMAL LOW (ref 13.0–17.1)
LYMPH#: 1.5 10*3/uL (ref 0.9–3.3)
LYMPH%: 26.8 % (ref 14.0–49.0)
MCH: 29.9 pg (ref 27.2–33.4)
MCHC: 33.3 g/dL (ref 32.0–36.0)
MCV: 89.8 fL (ref 79.3–98.0)
MONO#: 0.5 10*3/uL (ref 0.1–0.9)
MONO%: 8.5 % (ref 0.0–14.0)
NEUT#: 3.4 10*3/uL (ref 1.5–6.5)
NEUT%: 60.6 % (ref 39.0–75.0)
Platelets: 169 10*3/uL (ref 140–400)
RBC: 3.98 10*6/uL — AB (ref 4.20–5.82)
RDW: 13.8 % (ref 11.0–14.6)
WBC: 5.5 10*3/uL (ref 4.0–10.3)

## 2013-09-28 MED ORDER — DENOSUMAB 120 MG/1.7ML ~~LOC~~ SOLN: 120.0000 mg | Freq: Once | SUBCUTANEOUS | Status: DC

## 2013-09-28 MED ORDER — LEUPROLIDE ACETATE (3 MONTH) 22.5 MG IM KIT
22.5000 mg | PACK | Freq: Once | INTRAMUSCULAR | Status: AC
  Administered 2013-09-28: 22.5 mg via INTRAMUSCULAR
  Filled 2013-09-28: qty 22.5

## 2013-09-28 NOTE — Progress Notes (Signed)
Hematology and Oncology Follow Up Visit  Russell Browning [id number] 04-13-1941 73 y.o. 09/28/2013 10:25 AM    Principle Diagnosis: This is a pleasant 73 year old gentleman with  advanced prostate cancer. He has metastatic disease to the bone. It appears to be hormone sensitive at this time. His cancer was inially diagnosed in 2000.    Prior Therapy: 1. He received radiation therapy and 2 years of Lupron, but it presumably was locally advanced prostate cancer at the time of diagnosis in 2000.  2. PSA was under control from 2001 up until the early parts of 2012, where his PSA rose up to 16.9. At that time, he was under the care of Dr. 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 He subsequently started on  Lupron every 3 months.  3. He is S/P radiation therapy directed to the right hip (30 gray in 12 fractions) concluded in 07/2011 for palliative purposes conducted in [place].    Current therapy:  1. lupron 22.5 mg every three months.  2. Delton See given every 4 to 6 weeks started in 10/2011.   Interim History:  Russell Browning presents today for a follow up visit. He is a pleasant gentleman with the diagnosis of hormone sensitive advanced prostate cancer. He is currently on Lupron every 3 months. His last PSA on  06/21/2013 was 1.59.  He reports hot flashes associated with Lupron but these has improved with Effexor. No complications with Xgeva at this time. He reports feeling "weak, woozy and loopy" after he gets any type of injection. He states the sensations last for about 3 days and he usually just sleeps a lot. He reports no other issues with his treatment. He continues to enjoy an excellent quality of life. He has not reported any major changes with his performance status or and activity level.   Medications: I have reviewed the patient's current medications.   Current Outpatient Prescriptions  Medication Sig  Dispense Refill  . atenolol (TENORMIN) 50 MG tablet Take 50 mg by mouth daily. Take 1/4 tablet every day       . Calcium Carbonate-Vitamin D (CALCIUM-VITAMIN D) 500-200 MG-UNIT per tablet Take 1 tablet by mouth daily.      . clobetasol ointment (TEMOVATE) 2.20 % Apply 1 application topically daily.      . clonazePAM (KLONOPIN) 1 MG tablet Take 1 mg by mouth at bedtime.       . clopidogrel (PLAVIX) 75 MG tablet TAKE 1 TABLET BY MOUTH DAILY  30 tablet  1  . fentaNYL (DURAGESIC - DOSED MCG/HR) 25 MCG/HR Place 1 patch onto the skin every 3 (three) days.      Marland Kitchen leuprolide (LUPRON) 22.5 MG injection Inject 22.5 mg into the muscle every 3 (three) months.      . NON FORMULARY Inject into the muscle every 30 (thirty) days. [facility] injections.      Marland Kitchen oxyCODONE-acetaminophen (PERCOCET) 10-325 MG per tablet Take 1 tablet by mouth 4 (four) times daily as needed.       . pantoprazole (PROTONIX) 40 MG tablet Take 40 mg by mouth daily.      . polyethylene glycol (MIRALAX / GLYCOLAX) packet Take 17 g by mouth daily.      Marland Kitchen senna-docusate (EQ SENNA-S) 8.6-50 MG per tablet Take 2 tablets by mouth daily.  60 tablet  3  . simvastatin (ZOCOR) 40 MG tablet Take 40  mg by mouth at bedtime.        Marland Kitchen venlafaxine XR (EFFEXOR-XR) 150 MG 24 hr capsule Take 150 mg by mouth daily.      . vitamin B-12 (CYANOCOBALAMIN) 1000 MCG tablet Take 1,000 mcg by mouth daily.        . nitroGLYCERIN (NITROSTAT) 0.4 MG SL tablet Place 1 tablet (0.4 mg total) under the tongue every 5 (five) minutes as needed for chest pain.  25 tablet  3   No current facility-administered medications for this visit.     Allergies:  Allergies  Allergen Reactions  . Penicillins   . Phenazopyridine Hcl   . Ramipril     Past Medical History, Surgical history, Social history, and Family History were reviewed and updated.  Review of Systems:  Remaining ROS negative. Physical Exam: Blood pressure 112/75, pulse 77, temperature 98.3 F (36.8 C),  temperature source Oral, resp. rate 19, height 5\' 9"  (1.753 m), weight 137 lb 12.8 oz (62.506 kg).  ECOG: 1 General appearance: alert Head: Normocephalic, without obvious abnormality, atraumatic Neck: no adenopathy, no carotid bruit, no JVD, supple, symmetrical, trachea midline and thyroid not enlarged, symmetric, no tenderness/mass/nodules Lymph nodes: Cervical, supraclavicular, and axillary nodes normal. Heart:regular rate and rhythm, S1, S2 normal, no murmur, click, rub or gallop Lung:chest clear, no wheezing, rales, normal symmetric air entry Abdomin: soft, non-tender, without masses or organomegaly EXT:no erythema, induration, or nodules   Lab Results: Lab Results  Component Value Date   WBC 5.5 09/28/2013   HGB 11.9* 09/28/2013   HCT 35.8* 09/28/2013   MCV 89.8 09/28/2013   PLT 169 09/28/2013     Results for [employer] (MRN [id number]) as of 06/19/2013 09:16  Ref. Range 04/04/2013 08:10 05/21/2013 08:21  PSA Latest Range: <=4.00 ng/mL 0.05 0.04   Most recent PSA 06/21/2013 1.59   Impression and Plan: 73 year old gentleman with the following issues:  1. Hormone sensitive prostate cancer he is currently on Lupron with advanced disease to the bone. His PSA continues to be under good control.  Will give Lupron today and repeat in 3 months.   2. Bone pain: resolved  3. Bone directed therapy: Delton See was well tolerated. He will continue his injections every 6 week.   4. Follow up in 3 months.    Carlton Adam, PA-C  4/17/201510:25 AM

## 2013-09-28 NOTE — Telephone Encounter (Signed)
added lab appts to xgeva injs may thru aug. pt given new schedule w/labs added.

## 2013-09-28 NOTE — Telephone Encounter (Signed)
gv pt appt schedule for may thru aug. dates per 4/17 pof. per Delos Haring pt having both xgeva (4-6wks) and lupron (q38mo). per AJ inj on 7/21 is lupron others are xgeva.

## 2013-09-29 LAB — PSA: PSA: 0.05 ng/mL (ref <=4.00)

## 2013-10-01 ENCOUNTER — Telehealth: Payer: Self-pay | Admitting: Medical Oncology

## 2013-10-01 NOTE — Telephone Encounter (Signed)
Message copied by Maxwell Marion on Mon Oct 01, 2013 10:06 AM ------      Message from: Wyatt Portela      Created: Mon Oct 01, 2013  8:35 AM       Please call his PSA. Still very low ------

## 2013-10-01 NOTE — Telephone Encounter (Signed)
Per MD, called to inform patient of his PSA results, wife stated he was asleep, gave results to wife. Patient knows to call office with questions or concerns.

## 2013-10-04 NOTE — Patient Instructions (Signed)
Continue current therapy Follow up in 3 months

## 2013-10-05 ENCOUNTER — Encounter: Payer: Self-pay | Admitting: *Deleted

## 2013-10-06 DIAGNOSIS — S60229A Contusion of unspecified hand, initial encounter: Secondary | ICD-10-CM | POA: Diagnosis not present

## 2013-10-10 ENCOUNTER — Encounter: Payer: Self-pay | Admitting: Internal Medicine

## 2013-10-15 DIAGNOSIS — E782 Mixed hyperlipidemia: Secondary | ICD-10-CM | POA: Diagnosis not present

## 2013-10-15 DIAGNOSIS — I10 Essential (primary) hypertension: Secondary | ICD-10-CM | POA: Diagnosis not present

## 2013-10-15 DIAGNOSIS — K2100 Gastro-esophageal reflux disease with esophagitis, without bleeding: Secondary | ICD-10-CM | POA: Diagnosis not present

## 2013-10-22 ENCOUNTER — Telehealth: Payer: Self-pay | Admitting: *Deleted

## 2013-10-22 DIAGNOSIS — R059 Cough, unspecified: Secondary | ICD-10-CM | POA: Diagnosis not present

## 2013-10-22 DIAGNOSIS — M545 Low back pain, unspecified: Secondary | ICD-10-CM | POA: Diagnosis not present

## 2013-10-22 DIAGNOSIS — Z1331 Encounter for screening for depression: Secondary | ICD-10-CM | POA: Diagnosis not present

## 2013-10-22 DIAGNOSIS — N400 Enlarged prostate without lower urinary tract symptoms: Secondary | ICD-10-CM | POA: Diagnosis not present

## 2013-10-22 DIAGNOSIS — IMO0001 Reserved for inherently not codable concepts without codable children: Secondary | ICD-10-CM | POA: Diagnosis not present

## 2013-10-22 DIAGNOSIS — R05 Cough: Secondary | ICD-10-CM | POA: Diagnosis not present

## 2013-10-22 DIAGNOSIS — E782 Mixed hyperlipidemia: Secondary | ICD-10-CM | POA: Diagnosis not present

## 2013-10-22 DIAGNOSIS — I10 Essential (primary) hypertension: Secondary | ICD-10-CM | POA: Diagnosis not present

## 2013-10-22 DIAGNOSIS — G2589 Other specified extrapyramidal and movement disorders: Secondary | ICD-10-CM | POA: Diagnosis not present

## 2013-10-22 NOTE — Telephone Encounter (Signed)
I have called and spoke to the patient. We moved the appts for 5/15 to later in the day due to a meeting.

## 2013-10-26 ENCOUNTER — Ambulatory Visit (HOSPITAL_BASED_OUTPATIENT_CLINIC_OR_DEPARTMENT_OTHER): Payer: Medicare Other

## 2013-10-26 ENCOUNTER — Other Ambulatory Visit (HOSPITAL_BASED_OUTPATIENT_CLINIC_OR_DEPARTMENT_OTHER): Payer: Medicare Other

## 2013-10-26 ENCOUNTER — Encounter: Payer: Self-pay | Admitting: *Deleted

## 2013-10-26 VITALS — BP 130/74 | HR 94 | Temp 97.8°F

## 2013-10-26 DIAGNOSIS — I951 Orthostatic hypotension: Secondary | ICD-10-CM

## 2013-10-26 DIAGNOSIS — Z950 Presence of cardiac pacemaker: Secondary | ICD-10-CM

## 2013-10-26 DIAGNOSIS — E785 Hyperlipidemia, unspecified: Secondary | ICD-10-CM

## 2013-10-26 DIAGNOSIS — C61 Malignant neoplasm of prostate: Secondary | ICD-10-CM

## 2013-10-26 DIAGNOSIS — C7952 Secondary malignant neoplasm of bone marrow: Secondary | ICD-10-CM

## 2013-10-26 DIAGNOSIS — C7951 Secondary malignant neoplasm of bone: Secondary | ICD-10-CM

## 2013-10-26 DIAGNOSIS — G459 Transient cerebral ischemic attack, unspecified: Secondary | ICD-10-CM

## 2013-10-26 DIAGNOSIS — I495 Sick sinus syndrome: Secondary | ICD-10-CM

## 2013-10-26 DIAGNOSIS — T82110A Breakdown (mechanical) of cardiac electrode, initial encounter: Secondary | ICD-10-CM

## 2013-10-26 DIAGNOSIS — I259 Chronic ischemic heart disease, unspecified: Secondary | ICD-10-CM

## 2013-10-26 LAB — BASIC METABOLIC PANEL (CC13)
ANION GAP: 12 meq/L — AB (ref 3–11)
BUN: 14.1 mg/dL (ref 7.0–26.0)
CHLORIDE: 105 meq/L (ref 98–109)
CO2: 25 mEq/L (ref 22–29)
Calcium: 9.9 mg/dL (ref 8.4–10.4)
Creatinine: 1.1 mg/dL (ref 0.7–1.3)
GLUCOSE: 77 mg/dL (ref 70–140)
POTASSIUM: 4.1 meq/L (ref 3.5–5.1)
SODIUM: 142 meq/L (ref 136–145)

## 2013-10-26 MED ORDER — DENOSUMAB 120 MG/1.7ML ~~LOC~~ SOLN
120.0000 mg | Freq: Once | SUBCUTANEOUS | Status: AC
  Administered 2013-10-26: 120 mg via SUBCUTANEOUS
  Filled 2013-10-26: qty 1.7

## 2013-10-26 NOTE — Progress Notes (Signed)
Per patient's request, Faxed most recent office visit notes and labs to dr Coralyn Mark daniels, patient's pcp. [phone]

## 2013-12-04 ENCOUNTER — Other Ambulatory Visit (HOSPITAL_BASED_OUTPATIENT_CLINIC_OR_DEPARTMENT_OTHER): Payer: Medicare Other

## 2013-12-04 ENCOUNTER — Ambulatory Visit (HOSPITAL_BASED_OUTPATIENT_CLINIC_OR_DEPARTMENT_OTHER): Payer: Medicare Other

## 2013-12-04 VITALS — BP 137/68 | HR 82 | Temp 98.0°F

## 2013-12-04 DIAGNOSIS — C7951 Secondary malignant neoplasm of bone: Secondary | ICD-10-CM

## 2013-12-04 DIAGNOSIS — C7952 Secondary malignant neoplasm of bone marrow: Secondary | ICD-10-CM

## 2013-12-04 DIAGNOSIS — I259 Chronic ischemic heart disease, unspecified: Secondary | ICD-10-CM

## 2013-12-04 DIAGNOSIS — E785 Hyperlipidemia, unspecified: Secondary | ICD-10-CM

## 2013-12-04 DIAGNOSIS — C61 Malignant neoplasm of prostate: Secondary | ICD-10-CM

## 2013-12-04 DIAGNOSIS — G459 Transient cerebral ischemic attack, unspecified: Secondary | ICD-10-CM

## 2013-12-04 DIAGNOSIS — I951 Orthostatic hypotension: Secondary | ICD-10-CM

## 2013-12-04 DIAGNOSIS — Z950 Presence of cardiac pacemaker: Secondary | ICD-10-CM

## 2013-12-04 DIAGNOSIS — I495 Sick sinus syndrome: Secondary | ICD-10-CM

## 2013-12-04 DIAGNOSIS — T82110A Breakdown (mechanical) of cardiac electrode, initial encounter: Secondary | ICD-10-CM

## 2013-12-04 LAB — BASIC METABOLIC PANEL (CC13)
Anion Gap: 9 mEq/L (ref 3–11)
BUN: 15.5 mg/dL (ref 7.0–26.0)
CHLORIDE: 108 meq/L (ref 98–109)
CO2: 27 meq/L (ref 22–29)
Calcium: 9.6 mg/dL (ref 8.4–10.4)
Creatinine: 1 mg/dL (ref 0.7–1.3)
Glucose: 96 mg/dl (ref 70–140)
Potassium: 4.2 mEq/L (ref 3.5–5.1)
Sodium: 144 mEq/L (ref 136–145)

## 2013-12-04 MED ORDER — DENOSUMAB 120 MG/1.7ML ~~LOC~~ SOLN
120.0000 mg | Freq: Once | SUBCUTANEOUS | Status: AC
  Administered 2013-12-04: 120 mg via SUBCUTANEOUS
  Filled 2013-12-04: qty 1.7

## 2013-12-27 ENCOUNTER — Encounter: Payer: Medicare Other | Admitting: Internal Medicine

## 2014-01-01 ENCOUNTER — Encounter: Payer: Medicare Other | Admitting: Internal Medicine

## 2014-01-01 ENCOUNTER — Telehealth: Payer: Self-pay | Admitting: Oncology

## 2014-01-01 ENCOUNTER — Ambulatory Visit (HOSPITAL_BASED_OUTPATIENT_CLINIC_OR_DEPARTMENT_OTHER): Payer: Medicare Other | Admitting: Oncology

## 2014-01-01 ENCOUNTER — Encounter: Payer: Self-pay | Admitting: Oncology

## 2014-01-01 ENCOUNTER — Ambulatory Visit (HOSPITAL_BASED_OUTPATIENT_CLINIC_OR_DEPARTMENT_OTHER): Payer: Medicare Other

## 2014-01-01 ENCOUNTER — Other Ambulatory Visit (HOSPITAL_BASED_OUTPATIENT_CLINIC_OR_DEPARTMENT_OTHER): Payer: Medicare Other

## 2014-01-01 VITALS — BP 114/67 | HR 73 | Temp 97.7°F | Resp 18 | Ht 69.0 in | Wt 135.7 lb

## 2014-01-01 DIAGNOSIS — I259 Chronic ischemic heart disease, unspecified: Secondary | ICD-10-CM

## 2014-01-01 DIAGNOSIS — C7952 Secondary malignant neoplasm of bone marrow: Secondary | ICD-10-CM

## 2014-01-01 DIAGNOSIS — E785 Hyperlipidemia, unspecified: Secondary | ICD-10-CM

## 2014-01-01 DIAGNOSIS — M949 Disorder of cartilage, unspecified: Secondary | ICD-10-CM | POA: Diagnosis not present

## 2014-01-01 DIAGNOSIS — Z5111 Encounter for antineoplastic chemotherapy: Secondary | ICD-10-CM

## 2014-01-01 DIAGNOSIS — M25559 Pain in unspecified hip: Secondary | ICD-10-CM

## 2014-01-01 DIAGNOSIS — C7951 Secondary malignant neoplasm of bone: Secondary | ICD-10-CM | POA: Diagnosis not present

## 2014-01-01 DIAGNOSIS — C61 Malignant neoplasm of prostate: Secondary | ICD-10-CM

## 2014-01-01 DIAGNOSIS — M899 Disorder of bone, unspecified: Secondary | ICD-10-CM

## 2014-01-01 DIAGNOSIS — R5381 Other malaise: Secondary | ICD-10-CM

## 2014-01-01 DIAGNOSIS — I951 Orthostatic hypotension: Secondary | ICD-10-CM

## 2014-01-01 DIAGNOSIS — R5383 Other fatigue: Secondary | ICD-10-CM | POA: Diagnosis not present

## 2014-01-01 DIAGNOSIS — I495 Sick sinus syndrome: Secondary | ICD-10-CM

## 2014-01-01 DIAGNOSIS — T82110A Breakdown (mechanical) of cardiac electrode, initial encounter: Secondary | ICD-10-CM

## 2014-01-01 DIAGNOSIS — Z950 Presence of cardiac pacemaker: Secondary | ICD-10-CM

## 2014-01-01 DIAGNOSIS — G459 Transient cerebral ischemic attack, unspecified: Secondary | ICD-10-CM

## 2014-01-01 LAB — COMPREHENSIVE METABOLIC PANEL (CC13)
ALK PHOS: 21 U/L — AB (ref 40–150)
ALT: 13 U/L (ref 0–55)
AST: 18 U/L (ref 5–34)
Albumin: 3.8 g/dL (ref 3.5–5.0)
Anion Gap: 8 mEq/L (ref 3–11)
BILIRUBIN TOTAL: 0.31 mg/dL (ref 0.20–1.20)
BUN: 17.3 mg/dL (ref 7.0–26.0)
CO2: 29 mEq/L (ref 22–29)
Calcium: 9.5 mg/dL (ref 8.4–10.4)
Chloride: 105 mEq/L (ref 98–109)
Creatinine: 1 mg/dL (ref 0.7–1.3)
Glucose: 80 mg/dl (ref 70–140)
Potassium: 4.3 mEq/L (ref 3.5–5.1)
Sodium: 142 mEq/L (ref 136–145)
Total Protein: 6.6 g/dL (ref 6.4–8.3)

## 2014-01-01 LAB — CBC WITH DIFFERENTIAL/PLATELET
BASO%: 0.4 % (ref 0.0–2.0)
Basophils Absolute: 0 10*3/uL (ref 0.0–0.1)
EOS ABS: 0.2 10*3/uL (ref 0.0–0.5)
EOS%: 4 % (ref 0.0–7.0)
HCT: 33.1 % — ABNORMAL LOW (ref 38.4–49.9)
HGB: 10.7 g/dL — ABNORMAL LOW (ref 13.0–17.1)
LYMPH%: 27.4 % (ref 14.0–49.0)
MCH: 29.3 pg (ref 27.2–33.4)
MCHC: 32.3 g/dL (ref 32.0–36.0)
MCV: 90.7 fL (ref 79.3–98.0)
MONO#: 0.5 10*3/uL (ref 0.1–0.9)
MONO%: 8.9 % (ref 0.0–14.0)
NEUT%: 59.3 % (ref 39.0–75.0)
NEUTROS ABS: 3.3 10*3/uL (ref 1.5–6.5)
PLATELETS: 175 10*3/uL (ref 140–400)
RBC: 3.65 10*6/uL — ABNORMAL LOW (ref 4.20–5.82)
RDW: 13.4 % (ref 11.0–14.6)
WBC: 5.5 10*3/uL (ref 4.0–10.3)
lymph#: 1.5 10*3/uL (ref 0.9–3.3)

## 2014-01-01 MED ORDER — LEUPROLIDE ACETATE (4 MONTH) 30 MG IM KIT
30.0000 mg | PACK | Freq: Once | INTRAMUSCULAR | Status: AC
  Administered 2014-01-01: 30 mg via INTRAMUSCULAR
  Filled 2014-01-01: qty 30

## 2014-01-01 NOTE — Progress Notes (Signed)
Hematology and Oncology Follow Up Visit  Russell Browning [id number] [birth year] 73 y.o. 01/01/2014 8:31 AM    Principle Diagnosis: This is a pleasant 73 year old gentleman with  advanced prostate cancer. He has metastatic disease to the bone. It appears to be hormone sensitive at this time. His cancer was inially diagnosed in 2000.    Prior Therapy: 1. He received radiation therapy and 2 years of Lupron, but it presumably was locally advanced prostate cancer at the time of diagnosis in 2000.  2. PSA was under control from 2001 up until the early parts of 2012, where his PSA rose up to 16.9. At that time, he was under the care of Dr. 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 He subsequently started on  Lupron every 3 months.  3. He is S/P radiation therapy directed to the right hip (30 gray in 12 fractions) concluded in 07/2011 for palliative purposes conducted in [place].    Current therapy:  1. Lupron 22.5 mg every three months.  2. Delton See given every 4 to 6 weeks started in 10/2011.   Interim History:  Mr. Russell Browning presents today for a follow up visit with a friend. Since his last visit, he has been doing relatively well. He reports a slight fatigue and tiredness associated with injection the last few days. After that, he resumes all activities of daily living without any decline. He was complaining off right hip pain, but after radiation therapy his pain is a lot better but needing pain medications at night time. He is able to ambulate without any major difficulty his continued to perform activities of daily living without any major decline. He reports hot flashes associated with Lupron. No complications with Xgeva at this time. He is able to have excellent quality of life. Is not reporting any chest pain or difficulty breathing. Has not reported any nausea or vomiting. Has not reported any palpitation or leg  edema. He is not reporting any change in his bowel habits. Has not reported any urinary symptoms. Rest of his review of systems unremarkable.   Medications: I have reviewed the patient's current medications.   Current Outpatient Prescriptions  Medication Sig Dispense Refill  . atenolol (TENORMIN) 50 MG tablet Take 50 mg by mouth daily. Take 1/4 tablet every day       . Calcium Carbonate-Vitamin D (CALCIUM-VITAMIN D) 500-200 MG-UNIT per tablet Take 1 tablet by mouth daily.      . clobetasol ointment (TEMOVATE) 5.91 % Apply 1 application topically daily.      . clonazePAM (KLONOPIN) 1 MG tablet Take 1 mg by mouth at bedtime.       . clopidogrel (PLAVIX) 75 MG tablet TAKE 1 TABLET BY MOUTH DAILY  30 tablet  1  . fentaNYL (DURAGESIC - DOSED MCG/HR) 25 MCG/HR Place 1 patch onto the skin every 3 (three) days.      Marland Kitchen leuprolide (LUPRON) 22.5 MG injection Inject 22.5 mg into the muscle every 3 (three) months.      . nitroGLYCERIN (NITROSTAT) 0.4 MG SL tablet Place 1 tablet (0.4 mg total) under the tongue every 5 (five) minutes as needed for chest pain.  25 tablet  3  . NON FORMULARY Inject into the muscle every 30 (thirty) days. [facility] injections.      Marland Kitchen oxyCODONE-acetaminophen (PERCOCET) 10-325 MG per tablet Take 1 tablet by mouth 4 (four) times  daily as needed.       . pantoprazole (PROTONIX) 40 MG tablet Take 40 mg by mouth daily.      . polyethylene glycol (MIRALAX / GLYCOLAX) packet Take 17 g by mouth daily.      . simvastatin (ZOCOR) 40 MG tablet Take 40 mg by mouth at bedtime.        Marland Kitchen venlafaxine XR (EFFEXOR-XR) 150 MG 24 hr capsule Take 150 mg by mouth daily.      . vitamin B-12 (CYANOCOBALAMIN) 1000 MCG tablet Take 1,000 mcg by mouth daily.        Marland Kitchen senna-docusate (EQ SENNA-S) 8.6-50 MG per tablet Take 2 tablets by mouth daily.  60 tablet  3   No current facility-administered medications for this visit.     Allergies:  Allergies  Allergen Reactions  . Penicillins   .  Phenazopyridine Hcl   . Ramipril     Past Medical History, Surgical history, Social history, and Family History were reviewed and updated.   Physical Exam: Blood pressure 114/67, pulse 73, temperature 97.7 F (36.5 C), temperature source Oral, resp. rate 18, height 5\' 9"  (1.753 m), weight 135 lb 11.2 oz (61.553 kg). ECOG: 1 General appearance: alert Head: Normocephalic, without obvious abnormality, atraumatic Neck: no adenopathy Lymph nodes: Cervical, supraclavicular, and axillary nodes normal. Heart:regular rate and rhythm, S1, S2 normal, no murmur, click, rub or gallop Lung:chest clear, no wheezing, rales, normal symmetric air entry Abdomin: soft, non-tender, without masses or organomegaly EXT:no erythema, induration, or nodules   Lab Results: Lab Results  Component Value Date   WBC 5.5 01/01/2014   HGB 10.7* 01/01/2014   HCT 33.1* 01/01/2014   MCV 90.7 01/01/2014   PLT 175 01/01/2014     Results for Russell Browning, Russell Browning (MRN [id number]) as of 01/01/2014 08:34  Ref. Range 07/05/2013 08:03 09/28/2013 08:21  PSA Latest Range: <=4.00 ng/mL 0.04 0.05      Impression and Plan: 73 year old gentleman with the following issues:  1. Hormone sensitive prostate cancer he is currently on Lupron with advanced disease to the bone. His PSA is under excellent control. Will give Lupron today and repeat in 3 months.   2. Bone pain: under better control. Still need little pain medications at times.   3. Bone directed therapy: Delton See was well tolerated. He will continue his injections every 6 week. I continue to educate him about osteonecrosis of the jaw and hypocalcemia associated with this medication.  4. Follow up in 3 months.    [facility], MD 7/21/20158:31 AM

## 2014-01-01 NOTE — Telephone Encounter (Signed)
gv and pinted appt sched and avs for tp for Aug thru [facility]

## 2014-01-02 LAB — PSA: PSA: 0.08 ng/mL (ref <=4.00)

## 2014-01-15 ENCOUNTER — Other Ambulatory Visit (HOSPITAL_BASED_OUTPATIENT_CLINIC_OR_DEPARTMENT_OTHER): Payer: Medicare Other

## 2014-01-15 ENCOUNTER — Ambulatory Visit (HOSPITAL_BASED_OUTPATIENT_CLINIC_OR_DEPARTMENT_OTHER): Payer: Medicare Other

## 2014-01-15 VITALS — BP 124/62 | HR 66 | Temp 98.2°F

## 2014-01-15 DIAGNOSIS — C7952 Secondary malignant neoplasm of bone marrow: Secondary | ICD-10-CM

## 2014-01-15 DIAGNOSIS — C61 Malignant neoplasm of prostate: Secondary | ICD-10-CM | POA: Diagnosis not present

## 2014-01-15 DIAGNOSIS — C7951 Secondary malignant neoplasm of bone: Secondary | ICD-10-CM

## 2014-01-15 DIAGNOSIS — Z950 Presence of cardiac pacemaker: Secondary | ICD-10-CM

## 2014-01-15 DIAGNOSIS — I259 Chronic ischemic heart disease, unspecified: Secondary | ICD-10-CM

## 2014-01-15 DIAGNOSIS — I951 Orthostatic hypotension: Secondary | ICD-10-CM

## 2014-01-15 DIAGNOSIS — G459 Transient cerebral ischemic attack, unspecified: Secondary | ICD-10-CM

## 2014-01-15 DIAGNOSIS — E785 Hyperlipidemia, unspecified: Secondary | ICD-10-CM

## 2014-01-15 DIAGNOSIS — I495 Sick sinus syndrome: Secondary | ICD-10-CM

## 2014-01-15 LAB — BASIC METABOLIC PANEL (CC13)
ANION GAP: 8 meq/L (ref 3–11)
BUN: 12.4 mg/dL (ref 7.0–26.0)
CHLORIDE: 107 meq/L (ref 98–109)
CO2: 30 mEq/L — ABNORMAL HIGH (ref 22–29)
Calcium: 9.2 mg/dL (ref 8.4–10.4)
Creatinine: 0.9 mg/dL (ref 0.7–1.3)
Glucose: 67 mg/dl — ABNORMAL LOW (ref 70–140)
Potassium: 4.5 mEq/L (ref 3.5–5.1)
Sodium: 145 mEq/L (ref 136–145)

## 2014-01-15 MED ORDER — DENOSUMAB 120 MG/1.7ML ~~LOC~~ SOLN
120.0000 mg | Freq: Once | SUBCUTANEOUS | Status: AC
  Administered 2014-01-15: 120 mg via SUBCUTANEOUS
  Filled 2014-01-15: qty 1.7

## 2014-01-15 NOTE — Patient Instructions (Signed)
Denosumab injection What is this medicine? DENOSUMAB (den oh sue mab) slows bone breakdown. Prolia is used to treat osteoporosis in women after menopause and in men. Xgeva is used to prevent bone fractures and other bone problems caused by cancer bone metastases. Xgeva is also used to treat giant cell tumor of the bone. This medicine may be used for other purposes; ask your health care provider or pharmacist if you have questions. COMMON BRAND NAME(S): Prolia, XGEVA What should I tell my health care provider before I take this medicine? They need to know if you have any of these conditions: -dental disease -eczema -infection or history of infections -kidney disease or on dialysis -low blood calcium or vitamin D -malabsorption syndrome -scheduled to have surgery or tooth extraction -taking medicine that contains denosumab -thyroid or parathyroid disease -an unusual reaction to denosumab, other medicines, foods, dyes, or preservatives -pregnant or trying to get pregnant -breast-feeding How should I use this medicine? This medicine is for injection under the skin. It is given by a health care professional in a hospital or clinic setting. If you are getting Prolia, a special MedGuide will be given to you by the pharmacist with each prescription and refill. Be sure to read this information carefully each time. For Prolia, talk to your pediatrician regarding the use of this medicine in children. Special care may be needed. For Xgeva, talk to your pediatrician regarding the use of this medicine in children. While this drug may be prescribed for children as young as 13 years for selected conditions, precautions do apply. Overdosage: If you think you've taken too much of this medicine contact a poison control center or emergency room at once. Overdosage: If you think you have taken too much of this medicine contact a poison control center or emergency room at once. NOTE: This medicine is only for  you. Do not share this medicine with others. What if I miss a dose? It is important not to miss your dose. Call your doctor or health care professional if you are unable to keep an appointment. What may interact with this medicine? Do not take this medicine with any of the following medications: -other medicines containing denosumab This medicine may also interact with the following medications: -medicines that suppress the immune system -medicines that treat cancer -steroid medicines like prednisone or cortisone This list may not describe all possible interactions. Give your health care provider a list of all the medicines, herbs, non-prescription drugs, or dietary supplements you use. Also tell them if you smoke, drink alcohol, or use illegal drugs. Some items may interact with your medicine. What should I watch for while using this medicine? Visit your doctor or health care professional for regular checks on your progress. Your doctor or health care professional may order blood tests and other tests to see how you are doing. Call your doctor or health care professional if you get a cold or other infection while receiving this medicine. Do not treat yourself. This medicine may decrease your body's ability to fight infection. You should make sure you get enough calcium and vitamin D while you are taking this medicine, unless your doctor tells you not to. Discuss the foods you eat and the vitamins you take with your health care professional. See your dentist regularly. Brush and floss your teeth as directed. Before you have any dental work done, tell your dentist you are receiving this medicine. Do not become pregnant while taking this medicine or for 5 months after stopping   it. Women should inform their doctor if they wish to become pregnant or think they might be pregnant. There is a potential for serious side effects to an unborn child. Talk to your health care professional or pharmacist for more  information. What side effects may I notice from receiving this medicine? Side effects that you should report to your doctor or health care professional as soon as possible: -allergic reactions like skin rash, itching or hives, swelling of the face, lips, or tongue -breathing problems -chest pain -fast, irregular heartbeat -feeling faint or lightheaded, falls -fever, chills, or any other sign of infection -muscle spasms, tightening, or twitches -numbness or tingling -skin blisters or bumps, or is dry, peels, or red -slow healing or unexplained pain in the mouth or jaw -unusual bleeding or bruising Side effects that usually do not require medical attention (Report these to your doctor or health care professional if they continue or are bothersome.): -muscle pain -stomach upset, gas This list may not describe all possible side effects. Call your doctor for medical advice about side effects. You may report side effects to FDA at 1-800-FDA-1088. Where should I keep my medicine? This medicine is only given in a clinic, doctor's office, or other health care setting and will not be stored at home. NOTE: This sheet is a summary. It may not cover all possible information. If you have questions about this medicine, talk to your doctor, pharmacist, or health care provider.  2015, Elsevier/Gold Standard. (2011-11-29 12:37:47)  

## 2014-02-07 DIAGNOSIS — R059 Cough, unspecified: Secondary | ICD-10-CM | POA: Diagnosis not present

## 2014-02-07 DIAGNOSIS — C61 Malignant neoplasm of prostate: Secondary | ICD-10-CM | POA: Diagnosis not present

## 2014-02-07 DIAGNOSIS — IMO0001 Reserved for inherently not codable concepts without codable children: Secondary | ICD-10-CM | POA: Diagnosis not present

## 2014-02-07 DIAGNOSIS — M545 Low back pain, unspecified: Secondary | ICD-10-CM | POA: Diagnosis not present

## 2014-02-07 DIAGNOSIS — G2589 Other specified extrapyramidal and movement disorders: Secondary | ICD-10-CM | POA: Diagnosis not present

## 2014-02-07 DIAGNOSIS — I10 Essential (primary) hypertension: Secondary | ICD-10-CM | POA: Diagnosis not present

## 2014-02-07 DIAGNOSIS — Z5181 Encounter for therapeutic drug level monitoring: Secondary | ICD-10-CM | POA: Diagnosis not present

## 2014-02-07 DIAGNOSIS — N400 Enlarged prostate without lower urinary tract symptoms: Secondary | ICD-10-CM | POA: Diagnosis not present

## 2014-02-07 DIAGNOSIS — E782 Mixed hyperlipidemia: Secondary | ICD-10-CM | POA: Diagnosis not present

## 2014-02-07 DIAGNOSIS — Z1331 Encounter for screening for depression: Secondary | ICD-10-CM | POA: Diagnosis not present

## 2014-02-07 DIAGNOSIS — R05 Cough: Secondary | ICD-10-CM | POA: Diagnosis not present

## 2014-02-07 DIAGNOSIS — Z79899 Other long term (current) drug therapy: Secondary | ICD-10-CM | POA: Diagnosis not present

## 2014-02-26 ENCOUNTER — Ambulatory Visit (HOSPITAL_BASED_OUTPATIENT_CLINIC_OR_DEPARTMENT_OTHER): Payer: Medicare Other

## 2014-02-26 ENCOUNTER — Other Ambulatory Visit (HOSPITAL_BASED_OUTPATIENT_CLINIC_OR_DEPARTMENT_OTHER): Payer: Medicare Other

## 2014-02-26 VITALS — BP 139/82 | HR 79 | Temp 97.7°F

## 2014-02-26 DIAGNOSIS — C61 Malignant neoplasm of prostate: Secondary | ICD-10-CM

## 2014-02-26 DIAGNOSIS — C7951 Secondary malignant neoplasm of bone: Secondary | ICD-10-CM | POA: Diagnosis not present

## 2014-02-26 DIAGNOSIS — C7952 Secondary malignant neoplasm of bone marrow: Secondary | ICD-10-CM

## 2014-02-26 LAB — BASIC METABOLIC PANEL (CC13)
Anion Gap: 7 mEq/L (ref 3–11)
BUN: 14.3 mg/dL (ref 7.0–26.0)
CO2: 29 mEq/L (ref 22–29)
Calcium: 9.2 mg/dL (ref 8.4–10.4)
Chloride: 108 mEq/L (ref 98–109)
Creatinine: 1 mg/dL (ref 0.7–1.3)
Glucose: 79 mg/dl (ref 70–140)
POTASSIUM: 4.4 meq/L (ref 3.5–5.1)
Sodium: 144 mEq/L (ref 136–145)

## 2014-02-26 MED ORDER — DENOSUMAB 120 MG/1.7ML ~~LOC~~ SOLN
120.0000 mg | Freq: Once | SUBCUTANEOUS | Status: AC
  Administered 2014-02-26: 120 mg via SUBCUTANEOUS
  Filled 2014-02-26: qty 1.7

## 2014-03-01 ENCOUNTER — Ambulatory Visit (INDEPENDENT_AMBULATORY_CARE_PROVIDER_SITE_OTHER): Payer: Medicare Other | Admitting: Internal Medicine

## 2014-03-01 ENCOUNTER — Encounter: Payer: Self-pay | Admitting: Internal Medicine

## 2014-03-01 VITALS — BP 113/79 | HR 69 | Ht 69.0 in | Wt 135.0 lb

## 2014-03-01 DIAGNOSIS — I259 Chronic ischemic heart disease, unspecified: Secondary | ICD-10-CM

## 2014-03-01 DIAGNOSIS — Z950 Presence of cardiac pacemaker: Secondary | ICD-10-CM | POA: Diagnosis not present

## 2014-03-01 DIAGNOSIS — I495 Sick sinus syndrome: Secondary | ICD-10-CM | POA: Diagnosis not present

## 2014-03-01 LAB — MDC_IDC_ENUM_SESS_TYPE_INCLINIC
Battery Voltage: 2.78 V
Brady Statistic AP VP Percent: 0 %
Brady Statistic AP VS Percent: 81 %
Brady Statistic AS VS Percent: 19 %
Date Time Interrogation Session: 20150918120744
Lead Channel Impedance Value: 155 Ohm
Lead Channel Pacing Threshold Amplitude: 1 V
Lead Channel Pacing Threshold Pulse Width: 0.4 ms
Lead Channel Pacing Threshold Pulse Width: 0.76 ms
Lead Channel Setting Pacing Amplitude: 2 V
Lead Channel Setting Pacing Amplitude: 2.5 V
Lead Channel Setting Pacing Pulse Width: 0.76 ms
Lead Channel Setting Sensing Sensitivity: 4 mV
MDC IDC MSMT BATTERY IMPEDANCE: 755 Ohm
MDC IDC MSMT BATTERY REMAINING LONGEVITY: 66 mo
MDC IDC MSMT LEADCHNL RA PACING THRESHOLD AMPLITUDE: 0.75 V
MDC IDC MSMT LEADCHNL RA SENSING INTR AMPL: 0.5 mV
MDC IDC MSMT LEADCHNL RV IMPEDANCE VALUE: 988 Ohm
MDC IDC MSMT LEADCHNL RV SENSING INTR AMPL: 8 mV
MDC IDC STAT BRADY AS VP PERCENT: 0 %

## 2014-03-01 NOTE — Progress Notes (Signed)
PCP:  Gar Ponto, MD Primary Cardiologist:  Dr Domenic Polite  The patient presents today for routine electrophysiology followup.  Since last being seen in our clinic, the patient reports doing reasonably well.  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  Today, he denies symptoms of palpitations, chest pain, shortness of breath, orthopnea, dizziness, presyncope, syncope, or neurologic sequela.  The patient feels that he is tolerating medications without difficulties and is otherwise without complaint today.   Past Medical History  Diagnosis Date  . Ischemic     tansient ischemic attack  . Dyslipidemia   . Sinus node dysfunction     Status post pacemaker implantation and generator replacement, atrial lead insulation defect noted at time of generator change by Dr Caryl Comes 2008  . Single vessel coronary artery disease     severe, status post Taxus at right coronary artery in 2006 and preserved left ventricular  function  . Dyslipidemia     Drug-eluting stent to the right coronary artery November 2,2011, preserved LV function  . 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 Prostate cancer metastatic to multiple sites     s/p treatment, bone mets recently discovered   Past Surgical History  Procedure Laterality Date  . Pacemaker insertion      MDT    Current Outpatient Prescriptions  Medication Sig Dispense Refill  . atenolol (TENORMIN) 50 MG tablet Take 50 mg by mouth daily. Take 1/4 tablet every day       . Calcium Carbonate-Vitamin D (CALCIUM-VITAMIN D) 500-200 MG-UNIT per tablet Take 1 tablet by mouth daily.      . clobetasol ointment (TEMOVATE) 7.10 % Apply 1 application topically daily.      . clonazePAM (KLONOPIN) 1 MG tablet Take 1 mg by mouth at bedtime.       . clopidogrel (PLAVIX) 75 MG tablet TAKE 1 TABLET BY MOUTH DAILY  30 tablet  1  . fentaNYL  (DURAGESIC - DOSED MCG/HR) 25 MCG/HR Place 1 patch onto the skin every 3 (three) days.      Marland Kitchen leuprolide (LUPRON) 22.5 MG injection Inject 22.5 mg into the muscle every 3 (three) months.      . nitroGLYCERIN (NITROSTAT) 0.4 MG SL tablet Place 1 tablet (0.4 mg total) under the tongue every 5 (five) minutes as needed for chest pain.  25 tablet  3  . NON FORMULARY Inject into the muscle every 30 (thirty) days. [facility] injections.      Marland Kitchen oxyCODONE-acetaminophen (PERCOCET) 10-325 MG per tablet Take 1 tablet by mouth once.       . pantoprazole (PROTONIX) 40 MG tablet Take 40 mg by mouth daily.      . polyethylene glycol (MIRALAX / GLYCOLAX) packet Take 17 g by mouth daily.      Marland Kitchen senna-docusate (EQ SENNA-S) 8.6-50 MG per tablet Take 2 tablets by mouth daily.  60 tablet  3  . simvastatin (ZOCOR) 40 MG tablet Take 40 mg by mouth at bedtime.        Marland Kitchen venlafaxine XR (EFFEXOR-XR) 150 MG 24 hr capsule Take 150 mg by mouth daily.      . vitamin B-12 (CYANOCOBALAMIN) 1000 MCG tablet Take 1,000 mcg by mouth daily.         No current facility-administered medications for this visit.    Allergies  Allergen Reactions  . Penicillins   .  Phenazopyridine Hcl   . Ramipril     History   Social History  . Marital Status: Married    Spouse Name: N/A    Number of Children: N/A  . Years of Education: N/A   Occupational History  . Not on file.   Social History Main Topics  . Smoking status: Former Smoker -- 1.00 packs/day for 5 years    Types: Cigarettes    Quit date: 06/14/1965  . Smokeless tobacco: Former Systems developer    Types: [place] date: 06/14/1978     Comment: chewed tobacco 5 years 1PPD  . Alcohol Use: No  . Drug Use: No  . Sexual Activity: Not on file   Other Topics Concern  . Not on file   Social History Narrative  . No narrative on file    Physical Exam: Filed Vitals:   03/01/14 1154  BP: 113/79  Pulse: 69  Height: 5\' 9"  (1.753 m)  Weight: 135 lb (61.236 kg)  SpO2: 100%     GEN- The patient is well appearing, alert and oriented x 3 today.   Head- normocephalic, atraumatic Eyes-  Sclera clear, conjunctiva pink Ears- hearing intact Oropharynx- clear Neck- supple, no JVP Lungs- Clear to ausculation bilaterally, normal work of breathing Chest- pacemaker pocket is well healed Heart- Regular rate and rhythm, no murmurs, rubs or gallops, PMI not laterally displaced GI- soft, NT, ND, + BS Extremities- no clubbing, cyanosis, or edema  Pacemaker interrogation- reviewed in detail today,  See PACEART report  Assessment and Plan:  1. Sick sinus syndrome Atrial lead impedance is chronically low.  Battery status remains good. No changes at this time  2. CAD No ischemic symptoms No changes  Return to the device clinic in 1year [facility] every 3 months Follow-up with Dr Domenic Polite in 3 months

## 2014-03-01 NOTE — Patient Instructions (Addendum)
Your physician recommends that you schedule a follow-up appointment in: 1 year with Dr. Rayann Heman. You will receive a reminder letter in the mail in about 10 months reminding you to call and schedule your appointment. If you don't receive this letter, please contact our office. [facility]/device on 06/03/14. Your physician recommends that you continue on your current medications as directed. Please refer to the Current Medication list given to you today. Your physician recommends that you schedule a follow-up appointment in: 3 months with Dr. Domenic Polite.

## 2014-03-07 DIAGNOSIS — Z23 Encounter for immunization: Secondary | ICD-10-CM | POA: Diagnosis not present

## 2014-03-15 ENCOUNTER — Encounter: Payer: Self-pay | Admitting: Internal Medicine

## 2014-03-26 ENCOUNTER — Ambulatory Visit: Payer: Medicare Other | Admitting: Oncology

## 2014-03-26 ENCOUNTER — Other Ambulatory Visit: Payer: Self-pay | Admitting: Physician Assistant

## 2014-03-26 ENCOUNTER — Ambulatory Visit (HOSPITAL_BASED_OUTPATIENT_CLINIC_OR_DEPARTMENT_OTHER): Payer: Medicare Other

## 2014-03-26 ENCOUNTER — Telehealth: Payer: Self-pay | Admitting: Physician Assistant

## 2014-03-26 ENCOUNTER — Encounter: Payer: Self-pay | Admitting: Physician Assistant

## 2014-03-26 ENCOUNTER — Other Ambulatory Visit: Payer: Medicare Other

## 2014-03-26 ENCOUNTER — Ambulatory Visit (HOSPITAL_BASED_OUTPATIENT_CLINIC_OR_DEPARTMENT_OTHER): Payer: Medicare Other | Admitting: Physician Assistant

## 2014-03-26 ENCOUNTER — Other Ambulatory Visit (HOSPITAL_BASED_OUTPATIENT_CLINIC_OR_DEPARTMENT_OTHER): Payer: Medicare Other

## 2014-03-26 VITALS — BP 117/71 | HR 84 | Temp 97.9°F | Resp 20 | Ht 69.0 in | Wt 131.0 lb

## 2014-03-26 DIAGNOSIS — C7951 Secondary malignant neoplasm of bone: Secondary | ICD-10-CM | POA: Diagnosis not present

## 2014-03-26 DIAGNOSIS — C61 Malignant neoplasm of prostate: Secondary | ICD-10-CM

## 2014-03-26 DIAGNOSIS — Z5111 Encounter for antineoplastic chemotherapy: Secondary | ICD-10-CM

## 2014-03-26 LAB — CBC WITH DIFFERENTIAL/PLATELET
BASO%: 0.9 % (ref 0.0–2.0)
Basophils Absolute: 0 10*3/uL (ref 0.0–0.1)
EOS ABS: 0.3 10*3/uL (ref 0.0–0.5)
EOS%: 5.6 % (ref 0.0–7.0)
HCT: 34.2 % — ABNORMAL LOW (ref 38.4–49.9)
HGB: 11.2 g/dL — ABNORMAL LOW (ref 13.0–17.1)
LYMPH%: 36.5 % (ref 14.0–49.0)
MCH: 29.5 pg (ref 27.2–33.4)
MCHC: 32.6 g/dL (ref 32.0–36.0)
MCV: 90.4 fL (ref 79.3–98.0)
MONO#: 0.5 10*3/uL (ref 0.1–0.9)
MONO%: 10.1 % (ref 0.0–14.0)
NEUT%: 46.9 % (ref 39.0–75.0)
NEUTROS ABS: 2.4 10*3/uL (ref 1.5–6.5)
PLATELETS: 177 10*3/uL (ref 140–400)
RBC: 3.79 10*6/uL — ABNORMAL LOW (ref 4.20–5.82)
RDW: 14.3 % (ref 11.0–14.6)
WBC: 5.1 10*3/uL (ref 4.0–10.3)
lymph#: 1.9 10*3/uL (ref 0.9–3.3)

## 2014-03-26 LAB — BASIC METABOLIC PANEL (CC13)
Anion Gap: 9 mEq/L (ref 3–11)
BUN: 16.7 mg/dL (ref 7.0–26.0)
CALCIUM: 9.5 mg/dL (ref 8.4–10.4)
CO2: 27 meq/L (ref 22–29)
CREATININE: 1 mg/dL (ref 0.7–1.3)
Chloride: 108 mEq/L (ref 98–109)
Glucose: 83 mg/dl (ref 70–140)
Potassium: 4.2 mEq/L (ref 3.5–5.1)
Sodium: 144 mEq/L (ref 136–145)

## 2014-03-26 MED ORDER — DENOSUMAB 120 MG/1.7ML ~~LOC~~ SOLN: 120.0000 mg | Freq: Once | SUBCUTANEOUS | Status: DC

## 2014-03-26 MED ORDER — LEUPROLIDE ACETATE (3 MONTH) 22.5 MG IM KIT
22.5000 mg | PACK | Freq: Once | INTRAMUSCULAR | Status: AC
  Administered 2014-03-26: 22.5 mg via INTRAMUSCULAR
  Filled 2014-03-26: qty 22.5

## 2014-03-26 NOTE — Patient Instructions (Signed)
Continue Xgeva injections every 6 weeks Follow up in 3 months

## 2014-03-26 NOTE — Progress Notes (Signed)
Hematology and Oncology Follow Up Visit  Siddarth Hsiung [id number] 11/04/1940 73 y.o. 03/26/2014 10:27 AM    Principle Diagnosis: This is a pleasant 73 year old gentleman with  advanced prostate cancer. He has metastatic disease to the bone. It appears to be hormone sensitive at this time. His cancer was inially diagnosed in 2000.    Prior Therapy: 1. He received radiation therapy and 2 years of Lupron, but it presumably was locally advanced prostate cancer at the time of diagnosis in 2000.  2. PSA was under control from 2001 up until the early parts of 2012, where his PSA rose up to 16.9. At that time, he was under the care of Dr. 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 He subsequently started on  Lupron every 3 months.  3. He is S/P radiation therapy directed to the right hip (30 gray in 12 fractions) concluded in 07/2011 for palliative purposes conducted in [place].    Current therapy:  1. Lupron 22.5 mg every three months.  2. Delton See given every 4 to 6 weeks started in 10/2011.   Interim History:  Mr. Corriher presents today for a follow up visit with a friend. Since his last visit, he has been doing relatively well. He has been replacing some pipe work within his house and is a little fatigued and bruised from that endeavor but generally is doing well. He reports a slight fatigue and tiredness associated with injection that lasts for a few days. After that, he resumes all activities of daily living without any decline.  He reports hot flashes associated with Lupron. No complications with Xgeva at this time. He is able to have excellent quality of life. Is not reporting any chest pain or difficulty breathing. Has not reported any nausea or vomiting. Has not reported any palpitation or leg edema. He is not reporting any change in his bowel habits. Has not reported any urinary symptoms. Remainder of his  review of systems unremarkable.   Medications: I have reviewed the patient's current medications.   Current Outpatient Prescriptions  Medication Sig Dispense Refill  . atenolol (TENORMIN) 50 MG tablet Take 50 mg by mouth daily. Take 1/4 tablet every day       . Calcium Carbonate-Vitamin D (CALCIUM-VITAMIN D) 500-200 MG-UNIT per tablet Take 1 tablet by mouth daily.      . clonazePAM (KLONOPIN) 1 MG tablet Take 1 mg by mouth at bedtime.       . clopidogrel (PLAVIX) 75 MG tablet TAKE 1 TABLET BY MOUTH DAILY  30 tablet  1  . fentaNYL (DURAGESIC - DOSED MCG/HR) 25 MCG/HR Place 1 patch onto the skin every 3 (three) days.      Marland Kitchen leuprolide (LUPRON) 22.5 MG injection Inject 22.5 mg into the muscle every 3 (three) months.      . NON FORMULARY Inject into the muscle every 30 (thirty) days. [facility] injections.      Marland Kitchen oxyCODONE-acetaminophen (PERCOCET) 10-325 MG per tablet Take 1 tablet by mouth once.       . pantoprazole (PROTONIX) 40 MG tablet Take 40 mg by mouth daily.      . polyethylene glycol (MIRALAX / GLYCOLAX) packet Take 17 g by mouth daily.      Marland Kitchen senna-docusate (EQ SENNA-S) 8.6-50 MG per tablet Take 2 tablets by mouth daily.  60 tablet  3  . simvastatin (ZOCOR) 40 MG  tablet Take 40 mg by mouth at bedtime.        Marland Kitchen venlafaxine XR (EFFEXOR-XR) 150 MG 24 hr capsule Take 150 mg by mouth daily.      . vitamin B-12 (CYANOCOBALAMIN) 1000 MCG tablet Take 1,000 mcg by mouth daily.        . clobetasol ointment (TEMOVATE) 2.19 % Apply 1 application topically daily.      . nitroGLYCERIN (NITROSTAT) 0.4 MG SL tablet Place 1 tablet (0.4 mg total) under the tongue every 5 (five) minutes as needed for chest pain.  25 tablet  3   No current facility-administered medications for this visit.     Allergies:  Allergies  Allergen Reactions  . Penicillins   . Phenazopyridine Hcl   . Ramipril     Past Medical History, Surgical history, Social history, and Family History were reviewed and  updated.   Physical Exam: Blood pressure 117/71, pulse 84, temperature 97.9 F (36.6 C), temperature source Oral, resp. rate 20, height 5\' 9"  (1.753 m), weight 131 lb (59.421 kg), SpO2 100.00%. ECOG: 1 General appearance: alert Head: Normocephalic, without obvious abnormality, atraumatic Neck: no adenopathy Lymph nodes: Cervical, supraclavicular, and axillary nodes normal. Heart:regular rate and rhythm, S1, S2 normal, no murmur, click, rub or gallop Lung:chest clear, no wheezing, rales, normal symmetric air entry Abdomin: soft, non-tender, without masses or organomegaly EXT:no erythema, induration, or nodules   Lab Results: Lab Results  Component Value Date   WBC 5.1 03/26/2014   HGB 11.2* 03/26/2014   HCT 34.2* 03/26/2014   MCV 90.4 03/26/2014   PLT 177 03/26/2014     Results for [employer] (MRN [id number]) as of 01/01/2014 08:34  Ref. Range 07/05/2013 08:03 09/28/2013 08:21  PSA Latest Range: <=4.00 ng/mL 0.04 0.05      Impression and Plan: 73 year old gentleman with the following issues:  1. Hormone sensitive prostate cancer he is currently on Lupron with advanced disease to the bone. His PSA is under excellent control. Will give Lupron today and repeat in 3 months.   2. Bone pain: under better control. Continues to need little pain medications at times.   3. Bone directed therapy: Delton See was well tolerated. He will continue his injections every 6 week. 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  4. Follow up in 3 months.    Monnie Anspach E, PA-C  10/13/201510:27 AM

## 2014-03-26 NOTE — Telephone Encounter (Signed)
, °

## 2014-04-09 ENCOUNTER — Other Ambulatory Visit (HOSPITAL_BASED_OUTPATIENT_CLINIC_OR_DEPARTMENT_OTHER): Payer: Medicare Other

## 2014-04-09 ENCOUNTER — Ambulatory Visit (HOSPITAL_BASED_OUTPATIENT_CLINIC_OR_DEPARTMENT_OTHER): Payer: Medicare Other

## 2014-04-09 DIAGNOSIS — G459 Transient cerebral ischemic attack, unspecified: Secondary | ICD-10-CM

## 2014-04-09 DIAGNOSIS — C61 Malignant neoplasm of prostate: Secondary | ICD-10-CM | POA: Diagnosis not present

## 2014-04-09 DIAGNOSIS — E785 Hyperlipidemia, unspecified: Secondary | ICD-10-CM

## 2014-04-09 DIAGNOSIS — C7951 Secondary malignant neoplasm of bone: Secondary | ICD-10-CM | POA: Diagnosis not present

## 2014-04-09 DIAGNOSIS — Z950 Presence of cardiac pacemaker: Secondary | ICD-10-CM

## 2014-04-09 DIAGNOSIS — I951 Orthostatic hypotension: Secondary | ICD-10-CM

## 2014-04-09 DIAGNOSIS — T82110S Breakdown (mechanical) of cardiac electrode, sequela: Secondary | ICD-10-CM

## 2014-04-09 DIAGNOSIS — I259 Chronic ischemic heart disease, unspecified: Secondary | ICD-10-CM

## 2014-04-09 DIAGNOSIS — I495 Sick sinus syndrome: Secondary | ICD-10-CM

## 2014-04-09 LAB — CBC WITH DIFFERENTIAL/PLATELET
BASO%: 1.1 % (ref 0.0–2.0)
Basophils Absolute: 0.1 10*3/uL (ref 0.0–0.1)
EOS ABS: 0.2 10*3/uL (ref 0.0–0.5)
EOS%: 4.8 % (ref 0.0–7.0)
HCT: 34.4 % — ABNORMAL LOW (ref 38.4–49.9)
HGB: 11.1 g/dL — ABNORMAL LOW (ref 13.0–17.1)
LYMPH%: 26.5 % (ref 14.0–49.0)
MCH: 28.9 pg (ref 27.2–33.4)
MCHC: 32.2 g/dL (ref 32.0–36.0)
MCV: 89.7 fL (ref 79.3–98.0)
MONO#: 0.4 10*3/uL (ref 0.1–0.9)
MONO%: 8.9 % (ref 0.0–14.0)
NEUT#: 2.9 10*3/uL (ref 1.5–6.5)
NEUT%: 58.7 % (ref 39.0–75.0)
PLATELETS: 182 10*3/uL (ref 140–400)
RBC: 3.83 10*6/uL — ABNORMAL LOW (ref 4.20–5.82)
RDW: 14.5 % (ref 11.0–14.6)
WBC: 4.9 10*3/uL (ref 4.0–10.3)
lymph#: 1.3 10*3/uL (ref 0.9–3.3)

## 2014-04-09 LAB — COMPREHENSIVE METABOLIC PANEL (CC13)
ALK PHOS: 17 U/L — AB (ref 40–150)
ALT: 12 U/L (ref 0–55)
AST: 16 U/L (ref 5–34)
Albumin: 3.6 g/dL (ref 3.5–5.0)
Anion Gap: 7 mEq/L (ref 3–11)
BILIRUBIN TOTAL: 0.2 mg/dL (ref 0.20–1.20)
BUN: 18.1 mg/dL (ref 7.0–26.0)
CO2: 28 mEq/L (ref 22–29)
Calcium: 9.3 mg/dL (ref 8.4–10.4)
Chloride: 108 mEq/L (ref 98–109)
Creatinine: 1 mg/dL (ref 0.7–1.3)
GLUCOSE: 76 mg/dL (ref 70–140)
Potassium: 4.2 mEq/L (ref 3.5–5.1)
SODIUM: 143 meq/L (ref 136–145)
TOTAL PROTEIN: 6.1 g/dL — AB (ref 6.4–8.3)

## 2014-04-09 MED ORDER — DENOSUMAB 120 MG/1.7ML ~~LOC~~ SOLN
120.0000 mg | Freq: Once | SUBCUTANEOUS | Status: AC
  Administered 2014-04-09: 120 mg via SUBCUTANEOUS
  Filled 2014-04-09: qty 1.7

## 2014-04-09 NOTE — Patient Instructions (Signed)
Denosumab injection What is this medicine? DENOSUMAB (den oh sue mab) slows bone breakdown. Prolia is used to treat osteoporosis in women after menopause and in men. Xgeva is used to prevent bone fractures and other bone problems caused by cancer bone metastases. Xgeva is also used to treat giant cell tumor of the bone. This medicine may be used for other purposes; ask your health care provider or pharmacist if you have questions. COMMON BRAND NAME(S): Prolia, XGEVA What should I tell my health care provider before I take this medicine? They need to know if you have any of these conditions: -dental disease -eczema -infection or history of infections -kidney disease or on dialysis -low blood calcium or vitamin D -malabsorption syndrome -scheduled to have surgery or tooth extraction -taking medicine that contains denosumab -thyroid or parathyroid disease -an unusual reaction to denosumab, other medicines, foods, dyes, or preservatives -pregnant or trying to get pregnant -breast-feeding How should I use this medicine? This medicine is for injection under the skin. It is given by a health care professional in a hospital or clinic setting. If you are getting Prolia, a special MedGuide will be given to you by the pharmacist with each prescription and refill. Be sure to read this information carefully each time. For Prolia, talk to your pediatrician regarding the use of this medicine in children. Special care may be needed. For Xgeva, talk to your pediatrician regarding the use of this medicine in children. While this drug may be prescribed for children as young as 13 years for selected conditions, precautions do apply. Overdosage: If you think you've taken too much of this medicine contact a poison control center or emergency room at once. Overdosage: If you think you have taken too much of this medicine contact a poison control center or emergency room at once. NOTE: This medicine is only for  you. Do not share this medicine with others. What if I miss a dose? It is important not to miss your dose. Call your doctor or health care professional if you are unable to keep an appointment. What may interact with this medicine? Do not take this medicine with any of the following medications: -other medicines containing denosumab This medicine may also interact with the following medications: -medicines that suppress the immune system -medicines that treat cancer -steroid medicines like prednisone or cortisone This list may not describe all possible interactions. Give your health care provider a list of all the medicines, herbs, non-prescription drugs, or dietary supplements you use. Also tell them if you smoke, drink alcohol, or use illegal drugs. Some items may interact with your medicine. What should I watch for while using this medicine? Visit your doctor or health care professional for regular checks on your progress. Your doctor or health care professional may order blood tests and other tests to see how you are doing. Call your doctor or health care professional if you get a cold or other infection while receiving this medicine. Do not treat yourself. This medicine may decrease your body's ability to fight infection. You should make sure you get enough calcium and vitamin D while you are taking this medicine, unless your doctor tells you not to. Discuss the foods you eat and the vitamins you take with your health care professional. See your dentist regularly. Brush and floss your teeth as directed. Before you have any dental work done, tell your dentist you are receiving this medicine. Do not become pregnant while taking this medicine or for 5 months after stopping   it. Women should inform their doctor if they wish to become pregnant or think they might be pregnant. There is a potential for serious side effects to an unborn child. Talk to your health care professional or pharmacist for more  information. What side effects may I notice from receiving this medicine? Side effects that you should report to your doctor or health care professional as soon as possible: -allergic reactions like skin rash, itching or hives, swelling of the face, lips, or tongue -breathing problems -chest pain -fast, irregular heartbeat -feeling faint or lightheaded, falls -fever, chills, or any other sign of infection -muscle spasms, tightening, or twitches -numbness or tingling -skin blisters or bumps, or is dry, peels, or red -slow healing or unexplained pain in the mouth or jaw -unusual bleeding or bruising Side effects that usually do not require medical attention (Report these to your doctor or health care professional if they continue or are bothersome.): -muscle pain -stomach upset, gas This list may not describe all possible side effects. Call your doctor for medical advice about side effects. You may report side effects to FDA at 1-800-FDA-1088. Where should I keep my medicine? This medicine is only given in a clinic, doctor's office, or other health care setting and will not be stored at home. NOTE: This sheet is a summary. It may not cover all possible information. If you have questions about this medicine, talk to your doctor, pharmacist, or health care provider.  2015, Elsevier/Gold Standard. (2011-11-29 12:37:47)  

## 2014-04-10 LAB — TESTOSTERONE: Testosterone: <10 ng/dL — ABNORMAL LOW (ref 300–890)

## 2014-04-10 LAB — PSA: PSA: 0.06 ng/mL (ref <=4.00)

## 2014-05-10 DIAGNOSIS — E782 Mixed hyperlipidemia: Secondary | ICD-10-CM | POA: Diagnosis not present

## 2014-05-10 DIAGNOSIS — K210 Gastro-esophageal reflux disease with esophagitis: Secondary | ICD-10-CM | POA: Diagnosis not present

## 2014-05-10 DIAGNOSIS — I10 Essential (primary) hypertension: Secondary | ICD-10-CM | POA: Diagnosis not present

## 2014-05-17 DIAGNOSIS — K210 Gastro-esophageal reflux disease with esophagitis: Secondary | ICD-10-CM | POA: Diagnosis not present

## 2014-05-17 DIAGNOSIS — G2581 Restless legs syndrome: Secondary | ICD-10-CM | POA: Diagnosis not present

## 2014-05-17 DIAGNOSIS — F325 Major depressive disorder, single episode, in full remission: Secondary | ICD-10-CM | POA: Diagnosis not present

## 2014-05-17 DIAGNOSIS — C61 Malignant neoplasm of prostate: Secondary | ICD-10-CM | POA: Diagnosis not present

## 2014-05-17 DIAGNOSIS — Z9189 Other specified personal risk factors, not elsewhere classified: Secondary | ICD-10-CM | POA: Diagnosis not present

## 2014-05-17 DIAGNOSIS — Z1389 Encounter for screening for other disorder: Secondary | ICD-10-CM | POA: Diagnosis not present

## 2014-05-17 DIAGNOSIS — I10 Essential (primary) hypertension: Secondary | ICD-10-CM | POA: Diagnosis not present

## 2014-05-17 DIAGNOSIS — E782 Mixed hyperlipidemia: Secondary | ICD-10-CM | POA: Diagnosis not present

## 2014-05-21 ENCOUNTER — Ambulatory Visit (HOSPITAL_BASED_OUTPATIENT_CLINIC_OR_DEPARTMENT_OTHER): Payer: Medicare Other

## 2014-05-21 ENCOUNTER — Other Ambulatory Visit (HOSPITAL_BASED_OUTPATIENT_CLINIC_OR_DEPARTMENT_OTHER): Payer: Medicare Other

## 2014-05-21 DIAGNOSIS — C61 Malignant neoplasm of prostate: Secondary | ICD-10-CM

## 2014-05-21 DIAGNOSIS — Z950 Presence of cardiac pacemaker: Secondary | ICD-10-CM

## 2014-05-21 DIAGNOSIS — I951 Orthostatic hypotension: Secondary | ICD-10-CM

## 2014-05-21 DIAGNOSIS — C7951 Secondary malignant neoplasm of bone: Secondary | ICD-10-CM

## 2014-05-21 DIAGNOSIS — I495 Sick sinus syndrome: Secondary | ICD-10-CM

## 2014-05-21 DIAGNOSIS — T82110S Breakdown (mechanical) of cardiac electrode, sequela: Secondary | ICD-10-CM

## 2014-05-21 DIAGNOSIS — I259 Chronic ischemic heart disease, unspecified: Secondary | ICD-10-CM

## 2014-05-21 DIAGNOSIS — E785 Hyperlipidemia, unspecified: Secondary | ICD-10-CM

## 2014-05-21 DIAGNOSIS — G459 Transient cerebral ischemic attack, unspecified: Secondary | ICD-10-CM

## 2014-05-21 LAB — BASIC METABOLIC PANEL (CC13)
Anion Gap: 8 mEq/L (ref 3–11)
BUN: 19.2 mg/dL (ref 7.0–26.0)
CHLORIDE: 104 meq/L (ref 98–109)
CO2: 28 meq/L (ref 22–29)
Calcium: 9.4 mg/dL (ref 8.4–10.4)
Creatinine: 1 mg/dL (ref 0.7–1.3)
EGFR: 76 mL/min/{1.73_m2} — AB (ref >90)
Glucose: 74 mg/dl (ref 70–140)
Potassium: 4.1 mEq/L (ref 3.5–5.1)
Sodium: 140 mEq/L (ref 136–145)

## 2014-05-21 MED ORDER — DENOSUMAB 120 MG/1.7ML ~~LOC~~ SOLN
120.0000 mg | Freq: Once | SUBCUTANEOUS | Status: AC
  Administered 2014-05-21: 120 mg via SUBCUTANEOUS
  Filled 2014-05-21: qty 1.7

## 2014-05-21 NOTE — Patient Instructions (Signed)
Denosumab injection What is this medicine? DENOSUMAB (den oh sue mab) slows bone breakdown. Prolia is used to treat osteoporosis in women after menopause and in men. Xgeva is used to prevent bone fractures and other bone problems caused by cancer bone metastases. Xgeva is also used to treat giant cell tumor of the bone. This medicine may be used for other purposes; ask your health care provider or pharmacist if you have questions. COMMON BRAND NAME(S): Prolia, XGEVA What should I tell my health care provider before I take this medicine? They need to know if you have any of these conditions: -dental disease -eczema -infection or history of infections -kidney disease or on dialysis -low blood calcium or vitamin D -malabsorption syndrome -scheduled to have surgery or tooth extraction -taking medicine that contains denosumab -thyroid or parathyroid disease -an unusual reaction to denosumab, other medicines, foods, dyes, or preservatives -pregnant or trying to get pregnant -breast-feeding How should I use this medicine? This medicine is for injection under the skin. It is given by a health care professional in a hospital or clinic setting. If you are getting Prolia, a special MedGuide will be given to you by the pharmacist with each prescription and refill. Be sure to read this information carefully each time. For Prolia, talk to your pediatrician regarding the use of this medicine in children. Special care may be needed. For Xgeva, talk to your pediatrician regarding the use of this medicine in children. While this drug may be prescribed for children as young as 13 years for selected conditions, precautions do apply. Overdosage: If you think you've taken too much of this medicine contact a poison control center or emergency room at once. Overdosage: If you think you have taken too much of this medicine contact a poison control center or emergency room at once. NOTE: This medicine is only for  you. Do not share this medicine with others. What if I miss a dose? It is important not to miss your dose. Call your doctor or health care professional if you are unable to keep an appointment. What may interact with this medicine? Do not take this medicine with any of the following medications: -other medicines containing denosumab This medicine may also interact with the following medications: -medicines that suppress the immune system -medicines that treat cancer -steroid medicines like prednisone or cortisone This list may not describe all possible interactions. Give your health care provider a list of all the medicines, herbs, non-prescription drugs, or dietary supplements you use. Also tell them if you smoke, drink alcohol, or use illegal drugs. Some items may interact with your medicine. What should I watch for while using this medicine? Visit your doctor or health care professional for regular checks on your progress. Your doctor or health care professional may order blood tests and other tests to see how you are doing. Call your doctor or health care professional if you get a cold or other infection while receiving this medicine. Do not treat yourself. This medicine may decrease your body's ability to fight infection. You should make sure you get enough calcium and vitamin D while you are taking this medicine, unless your doctor tells you not to. Discuss the foods you eat and the vitamins you take with your health care professional. See your dentist regularly. Brush and floss your teeth as directed. Before you have any dental work done, tell your dentist you are receiving this medicine. Do not become pregnant while taking this medicine or for 5 months after stopping   it. Women should inform their doctor if they wish to become pregnant or think they might be pregnant. There is a potential for serious side effects to an unborn child. Talk to your health care professional or pharmacist for more  information. What side effects may I notice from receiving this medicine? Side effects that you should report to your doctor or health care professional as soon as possible: -allergic reactions like skin rash, itching or hives, swelling of the face, lips, or tongue -breathing problems -chest pain -fast, irregular heartbeat -feeling faint or lightheaded, falls -fever, chills, or any other sign of infection -muscle spasms, tightening, or twitches -numbness or tingling -skin blisters or bumps, or is dry, peels, or red -slow healing or unexplained pain in the mouth or jaw -unusual bleeding or bruising Side effects that usually do not require medical attention (Report these to your doctor or health care professional if they continue or are bothersome.): -muscle pain -stomach upset, gas This list may not describe all possible side effects. Call your doctor for medical advice about side effects. You may report side effects to FDA at 1-800-FDA-1088. Where should I keep my medicine? This medicine is only given in a clinic, doctor's office, or other health care setting and will not be stored at home. NOTE: This sheet is a summary. It may not cover all possible information. If you have questions about this medicine, talk to your doctor, pharmacist, or health care provider.  2015, Elsevier/Gold Standard. (2011-11-29 12:37:47)  

## 2014-06-03 ENCOUNTER — Encounter: Payer: Self-pay | Admitting: Internal Medicine

## 2014-06-03 ENCOUNTER — Ambulatory Visit (INDEPENDENT_AMBULATORY_CARE_PROVIDER_SITE_OTHER): Payer: Medicare Other | Admitting: *Deleted

## 2014-06-03 DIAGNOSIS — I495 Sick sinus syndrome: Secondary | ICD-10-CM | POA: Diagnosis not present

## 2014-06-03 NOTE — Progress Notes (Signed)
Remote pacemaker transmission.   

## 2014-06-04 ENCOUNTER — Encounter: Payer: Self-pay | Admitting: *Deleted

## 2014-06-04 ENCOUNTER — Ambulatory Visit (INDEPENDENT_AMBULATORY_CARE_PROVIDER_SITE_OTHER): Payer: Medicare Other | Admitting: Cardiology

## 2014-06-04 ENCOUNTER — Encounter: Payer: Self-pay | Admitting: Cardiology

## 2014-06-04 VITALS — BP 107/69 | HR 83 | Ht 69.0 in | Wt 135.0 lb

## 2014-06-04 DIAGNOSIS — I495 Sick sinus syndrome: Secondary | ICD-10-CM | POA: Diagnosis not present

## 2014-06-04 DIAGNOSIS — I2510 Atherosclerotic heart disease of native coronary artery without angina pectoris: Secondary | ICD-10-CM

## 2014-06-04 DIAGNOSIS — E782 Mixed hyperlipidemia: Secondary | ICD-10-CM

## 2014-06-04 DIAGNOSIS — I259 Chronic ischemic heart disease, unspecified: Secondary | ICD-10-CM | POA: Diagnosis not present

## 2014-06-04 DIAGNOSIS — R0602 Shortness of breath: Secondary | ICD-10-CM

## 2014-06-04 LAB — MDC_IDC_ENUM_SESS_TYPE_REMOTE
Battery Impedance: 911 Ohm
Battery Remaining Longevity: 56 mo
Brady Statistic AP VP Percent: 0 %
Brady Statistic AP VS Percent: 83 %
Brady Statistic AS VP Percent: 0 %
Brady Statistic AS VS Percent: 17 %
Date Time Interrogation Session: 20151221151131
Lead Channel Impedance Value: 152 Ohm
Lead Channel Setting Pacing Amplitude: 2 V
Lead Channel Setting Pacing Amplitude: 2.5 V
Lead Channel Setting Pacing Pulse Width: 0.76 ms
Lead Channel Setting Sensing Sensitivity: 5.6 mV
MDC IDC MSMT BATTERY VOLTAGE: 2.79 V
MDC IDC MSMT LEADCHNL RV IMPEDANCE VALUE: 1188 Ohm
MDC IDC MSMT LEADCHNL RV SENSING INTR AMPL: 8 mV

## 2014-06-04 NOTE — Assessment & Plan Note (Signed)
He continues on Zocor, LDL 97 as of November. This is followed by Dr. Quillian Quince.

## 2014-06-04 NOTE — Assessment & Plan Note (Signed)
Status post [employer]tronic pacemaker, followed by Dr. Rayann Heman. Nursing is contacting device clinic to ensure that remote transmissions have been adequate.

## 2014-06-04 NOTE — Progress Notes (Signed)
Reason for visit: CAD, hyperlipidemia  Clinical Summary Mr. Russell Browning is a 74 y.o.male former patient of Dr. Dannielle Browning (last seen 2013), presenting for a follow-up visit today. This is actually our first meeting in the office. He has followed with Dr. [name] over the last few years for pacemaker evaluation - office note from September reviewed. He is here with his wife today. Reports no angina symptoms, but has been fatigued and more short of breath over the last several months. He reports compliance with his medications, remains on Plavix, atenolol, Zocor, and as needed nitroglycerin. He states that he was taken off aspirin by Dr. Quillian Browning due to easy bruising.  ECG today shows normal sinus rhythm at 63 bpm. I reviewed his records. He has not had any follow-up ischemic testing in greater than 5 years.  Labwork from November showed hemoglobin 12.2, platelets 156, BUN 14, creatinine 1.0, , potassium 4.3, normal AST and ALT, cholesterol 176, triglycerides 152, HDL 49, and LDL 97.  He states that he thinks he had some difficulty with his remote device transmission attempted yesterday. This is being coordinated by nursing call to the device clinic.   Allergies  Allergen Reactions  . Penicillins   . Phenazopyridine Hcl   . Ramipril     Current Outpatient Prescriptions  Medication Sig Dispense Refill  . atenolol (TENORMIN) 50 MG tablet Take 50 mg by mouth daily. Take 1/4 tablet every day     . Calcium Carbonate-Vitamin D (CALCIUM-VITAMIN D) 500-200 MG-UNIT per tablet Take 1 tablet by mouth daily.    . clobetasol ointment (TEMOVATE) 3.38 % Apply 1 application topically daily.    . clonazePAM (KLONOPIN) 1 MG tablet Take 1 mg by mouth at bedtime.     . clopidogrel (PLAVIX) 75 MG tablet TAKE 1 TABLET BY MOUTH DAILY 30 tablet 1  . denosumab (XGEVA) 120 MG/1.7ML SOLN injection Inject 120 mg into the skin every 6 (six) weeks.    . fentaNYL (DURAGESIC - DOSED MCG/HR) 25 MCG/HR Place 1 patch onto the skin  every 3 (three) days.    Marland Kitchen leuprolide (LUPRON) 22.5 MG injection Inject 22.5 mg into the muscle every 3 (three) months.    . nitroGLYCERIN (NITROSTAT) 0.4 MG SL tablet Place 1 tablet (0.4 mg total) under the tongue every 5 (five) minutes as needed for chest pain. 25 tablet 3  . oxyCODONE-acetaminophen (PERCOCET) 10-325 MG per tablet Take 1 tablet by mouth once.     . pantoprazole (PROTONIX) 40 MG tablet Take 40 mg by mouth daily.    . polyethylene glycol (MIRALAX / GLYCOLAX) packet Take 17 g by mouth daily.    Marland Kitchen senna-docusate (EQ SENNA-S) 8.6-50 MG per tablet Take 2 tablets by mouth daily. 60 tablet 3  . simvastatin (ZOCOR) 40 MG tablet Take 40 mg by mouth at bedtime.      Marland Kitchen venlafaxine XR (EFFEXOR-XR) 150 MG 24 hr capsule Take 150 mg by mouth daily.    . vitamin B-12 (CYANOCOBALAMIN) 1000 MCG tablet Take 1,000 mcg by mouth daily.       No current facility-administered medications for this visit.    Past Medical History  Diagnosis Date  . History of TIAs   . Mixed hyperlipidemia   . Sinus node dysfunction     Russell Browning pacemaker - Dr. [name]  . CAD (coronary artery disease), native coronary artery     DES to proximal, mid, and distal RCA 2006  . Pacemaker lead failure     RA lead  insulation defect noted at Generator change by Dr Russell Browning 2008, impedance now low, but lead functioning otherwise normally  . Prostate cancer metastatic to bone     Social History Mr. Russell Browning reports that he quit smoking about 49 years ago. His smoking use included Cigarettes. He started smoking about 62 years ago. He has a 5 pack-year smoking history. He quit smokeless tobacco use about 35 years ago. His smokeless tobacco use included Chew. Mr. Russell Browning reports that he does not drink alcohol.  Review of Systems Complete review of systems negative except as otherwise outlined in the clinical summary and also the following. No palpitations or syncope. No significant bleeding problems on Plavix. Stable  appetite. No claudication.  Physical Examination Filed Vitals:   06/04/14 0849  BP: 107/69  Pulse: 83   Filed Weights   06/04/14 0849  Weight: 135 lb (61.236 kg)   Patient appears comfortable at rest. HEENT: Conjunctiva and lids normal, oropharynx clear. Neck: Supple, no elevated JVP or carotid bruits, no thyromegaly. Lungs: Clear to auscultation, nonlabored breathing at rest. Cardiac: Regular rate and rhythm, no S3, soft systolic murmur. Abdomen: Soft, nontender, bowel sounds present, no guarding or rebound. Extremities: No pitting edema, distal pulses 2+. Skin: Warm and dry. Musculoskeletal: No kyphosis. Neuropsychiatric: Alert and oriented x3, affect grossly appropriate.   Problem List and Plan   CAD (coronary artery disease), native coronary artery He is not reporting angina symptoms, but states he has been more short of breath over the last several months. ECG is reviewed above. He has not had any ischemic testing in greater than 5 years based on chart review. Prior DES intervention to the RCA as outlined. Plan is to continue medical therapy and proceed with a Lexiscan Cardiolite for reassessment of ischemic burden.  Mixed hyperlipidemia He continues on Zocor, LDL 97 as of November. This is followed by Dr. Quillian Browning.  Sinoatrial node dysfunction Status post Russell Browning pacemaker, followed by Dr. [name]. Nursing is contacting device clinic to ensure that remote transmissions have been adequate.    Satira Sark, M.D., F.A.C.C.

## 2014-06-04 NOTE — Patient Instructions (Signed)

## 2014-06-04 NOTE — Assessment & Plan Note (Signed)
He is not reporting angina symptoms, but states he has been more short of breath over the last several months. ECG is reviewed above. He has not had any ischemic testing in greater than 5 years based on chart review. Prior DES intervention to the RCA as outlined. Plan is to continue medical therapy and proceed with a Lexiscan Cardiolite for reassessment of ischemic burden.

## 2014-06-11 ENCOUNTER — Encounter: Payer: Self-pay | Admitting: Cardiology

## 2014-06-13 ENCOUNTER — Encounter (HOSPITAL_COMMUNITY)
Admission: RE | Admit: 2014-06-13 | Discharge: 2014-06-13 | Disposition: A | Discharge status: Home / Self Care | Payer: Medicare Other | Source: Ambulatory Visit | Attending: Cardiology | Admitting: Cardiology

## 2014-06-13 ENCOUNTER — Ambulatory Visit (HOSPITAL_COMMUNITY)
Admission: RE | Admit: 2014-06-13 | Discharge: 2014-06-13 | Disposition: A | Discharge status: Home / Self Care | Payer: Medicare Other | Source: Ambulatory Visit | Attending: Cardiology | Admitting: Cardiology

## 2014-06-13 ENCOUNTER — Encounter (HOSPITAL_COMMUNITY): Payer: Self-pay

## 2014-06-13 DIAGNOSIS — R079 Chest pain, unspecified: Secondary | ICD-10-CM | POA: Diagnosis not present

## 2014-06-13 DIAGNOSIS — R0602 Shortness of breath: Secondary | ICD-10-CM | POA: Insufficient documentation

## 2014-06-13 DIAGNOSIS — I2510 Atherosclerotic heart disease of native coronary artery without angina pectoris: Secondary | ICD-10-CM | POA: Diagnosis not present

## 2014-06-13 MED ORDER — TECHNETIUM TC 99M SESTAMIBI - CARDIOLITE
10.0000 | Freq: Once | INTRAVENOUS | Status: AC | PRN
  Administered 2014-06-13: 10 via INTRAVENOUS

## 2014-06-13 MED ORDER — TECHNETIUM TC 99M SESTAMIBI GENERIC - CARDIOLITE
30.0000 | Freq: Once | INTRAVENOUS | Status: AC | PRN
  Administered 2014-06-13: 30 via INTRAVENOUS

## 2014-06-13 MED ORDER — SODIUM CHLORIDE 0.9 % IJ SOLN
INTRAMUSCULAR | Status: AC
  Administered 2014-06-13: 10 mL via INTRAVENOUS
  Filled 2014-06-13: qty 10

## 2014-06-13 MED ORDER — REGADENOSON 0.4 MG/5ML IV SOLN
0.4000 mg | Freq: Once | INTRAVENOUS | Status: AC | PRN
  Administered 2014-06-13: 0.4 mg via INTRAVENOUS

## 2014-06-13 MED ORDER — REGADENOSON 0.4 MG/5ML IV SOLN
INTRAVENOUS | Status: AC
  Administered 2014-06-13: 0.4 mg via INTRAVENOUS
  Filled 2014-06-13: qty 5

## 2014-06-13 MED ORDER — SODIUM CHLORIDE 0.9 % IJ SOLN
10.0000 mL | INTRAMUSCULAR | Status: DC | PRN
  Administered 2014-06-13: 10 mL via INTRAVENOUS
  Filled 2014-06-13: qty 10

## 2014-06-13 NOTE — Progress Notes (Signed)
Stress Lab Nurses Notes - [facility]  Russell Browning 06/13/2014 Reason for doing test: CAD and SOB Type of test: Wille Glaser Nurse performing test: Gerrit Halls, RN Nuclear Medicine Tech: Melburn Hake Echo Tech: Not Applicable MD performing test: Lizbeth Bark Family MD: Maryla Morrow explained and consent signed: Yes.   IV started: Saline lock flushed, No redness or edema and Saline lock started in radiology Symptoms: Felt discomfort in head, chest & stomache Treatment/Intervention: None Reason test stopped: protocol completed After recovery IV was: Discontinued via X-ray tech and No redness or edema Patient to return to Nuc. Med at :11:45 Patient discharged: Home Patient's Condition upon discharge was: stable Comments: During test BP 110/64 & HR 78.  Recovery BP 126/68 & HR 65.  Symptoms resolved in recovery.   Russell Browning

## 2014-06-17 ENCOUNTER — Telehealth: Payer: Self-pay | Admitting: *Deleted

## 2014-06-17 NOTE — Telephone Encounter (Signed)
Error

## 2014-06-19 ENCOUNTER — Telehealth: Payer: Self-pay | Admitting: *Deleted

## 2014-06-19 NOTE — Telephone Encounter (Signed)
Patient informed. 

## 2014-06-19 NOTE — Telephone Encounter (Signed)
-----   Message from Satira Sark, MD sent at 06/14/2014  7:56 AM EST ----- Reviewed report. Please let him know that the stress test was low risk, no ischemia. Suggest continued medical therapy and followup in six months.

## 2014-06-25 ENCOUNTER — Ambulatory Visit: Payer: Medicare Other

## 2014-06-25 ENCOUNTER — Other Ambulatory Visit: Payer: Medicare Other

## 2014-06-26 ENCOUNTER — Telehealth: Payer: Self-pay | Admitting: Oncology

## 2014-06-26 ENCOUNTER — Ambulatory Visit (HOSPITAL_BASED_OUTPATIENT_CLINIC_OR_DEPARTMENT_OTHER): Payer: Medicare Other | Admitting: Oncology

## 2014-06-26 ENCOUNTER — Ambulatory Visit (HOSPITAL_BASED_OUTPATIENT_CLINIC_OR_DEPARTMENT_OTHER): Payer: Medicare Other

## 2014-06-26 ENCOUNTER — Other Ambulatory Visit (HOSPITAL_BASED_OUTPATIENT_CLINIC_OR_DEPARTMENT_OTHER): Payer: Medicare Other

## 2014-06-26 VITALS — BP 124/62 | HR 90 | Temp 98.8°F | Resp 18 | Ht 69.0 in | Wt 138.5 lb

## 2014-06-26 DIAGNOSIS — Z5111 Encounter for antineoplastic chemotherapy: Secondary | ICD-10-CM

## 2014-06-26 DIAGNOSIS — C61 Malignant neoplasm of prostate: Secondary | ICD-10-CM

## 2014-06-26 DIAGNOSIS — C7951 Secondary malignant neoplasm of bone: Secondary | ICD-10-CM | POA: Diagnosis not present

## 2014-06-26 LAB — CBC WITH DIFFERENTIAL/PLATELET
BASO%: 0.5 % (ref 0.0–2.0)
Basophils Absolute: 0 10*3/uL (ref 0.0–0.1)
EOS%: 4 % (ref 0.0–7.0)
Eosinophils Absolute: 0.2 10*3/uL (ref 0.0–0.5)
HCT: 37.1 % — ABNORMAL LOW (ref 38.4–49.9)
HGB: 11.8 g/dL — ABNORMAL LOW (ref 13.0–17.1)
LYMPH%: 25.3 % (ref 14.0–49.0)
MCH: 28.8 pg (ref 27.2–33.4)
MCHC: 32 g/dL (ref 32.0–36.0)
MCV: 90.2 fL (ref 79.3–98.0)
MONO#: 0.5 10*3/uL (ref 0.1–0.9)
MONO%: 8.8 % (ref 0.0–14.0)
NEUT#: 3.4 10*3/uL (ref 1.5–6.5)
NEUT%: 61.4 % (ref 39.0–75.0)
Platelets: 170 10*3/uL (ref 140–400)
RBC: 4.11 10*6/uL — ABNORMAL LOW (ref 4.20–5.82)
RDW: 14.1 % (ref 11.0–14.6)
WBC: 5.6 10*3/uL (ref 4.0–10.3)
lymph#: 1.4 10*3/uL (ref 0.9–3.3)

## 2014-06-26 LAB — COMPREHENSIVE METABOLIC PANEL (CC13)
ALK PHOS: 20 U/L — AB (ref 40–150)
ALT: 10 U/L (ref 0–55)
ANION GAP: 7 meq/L (ref 3–11)
AST: 16 U/L (ref 5–34)
Albumin: 4 g/dL (ref 3.5–5.0)
BILIRUBIN TOTAL: 0.26 mg/dL (ref 0.20–1.20)
BUN: 14.2 mg/dL (ref 7.0–26.0)
CO2: 30 meq/L — AB (ref 22–29)
Calcium: 9 mg/dL (ref 8.4–10.4)
Chloride: 103 mEq/L (ref 98–109)
Creatinine: 1 mg/dL (ref 0.7–1.3)
EGFR: 77 mL/min/{1.73_m2} — AB (ref >90)
GLUCOSE: 70 mg/dL (ref 70–140)
POTASSIUM: 4 meq/L (ref 3.5–5.1)
SODIUM: 140 meq/L (ref 136–145)
Total Protein: 6.5 g/dL (ref 6.4–8.3)

## 2014-06-26 MED ORDER — LEUPROLIDE ACETATE (3 MONTH) 22.5 MG IM KIT
22.5000 mg | PACK | Freq: Once | INTRAMUSCULAR | Status: AC
  Administered 2014-06-26: 22.5 mg via INTRAMUSCULAR
  Filled 2014-06-26: qty 22.5

## 2014-06-26 NOTE — Telephone Encounter (Signed)
Pt confirmed labs/ov/inj per 01/13 POF, gave pt AVS.... KJ  °

## 2014-06-26 NOTE — Progress Notes (Signed)
Hematology and Oncology Follow Up Visit  Russell Browning [id number] Jan 26, 1941 74 y.o. 06/26/2014 9:57 AM    Principle Diagnosis: This is a pleasant 74 year old gentleman with  advanced prostate cancer. He has metastatic disease to the bone. It appears to be hormone sensitive at this time. His cancer was inially diagnosed in 2000.    Prior Therapy: 1. He received radiation therapy and 2 years of Lupron, but it presumably was locally advanced prostate cancer at the time of diagnosis in 2000.  2. PSA was under control from 2001 up until the early parts of 2012, where his PSA rose up to 16.9. At that time, he was under the care of Dr. 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 He subsequently started on  Lupron every 3 months.  3. He is S/P radiation therapy directed to the right hip (30 gray in 12 fractions) concluded in 07/2011 for palliative purposes conducted in [place].    Current therapy:  1. Lupron 22.5 mg every three months.  2. Delton See given every 4 to 6 weeks started in 10/2011.   Interim History:  Russell Browning presents today for a follow up visit with a friend. Since his last visit, he continues to do well without any new complaints. He reports a slight fatigue and tiredness associated with injection that lasts for a few days. After that, he resumes all activities of daily living without any decline.  He reports hot flashes associated with Lupron. No complications with Xgeva at this time. He is able to have excellent quality of life. Is not reporting any chest pain or difficulty breathing. Has not reported any nausea or vomiting. Has not reported any palpitation or leg edema. He is not reporting any change in his bowel habits. Has not reported any urinary symptoms. Remainder of his review of systems unremarkable.   Medications: I have reviewed the patient's current medications.   Current Outpatient  Prescriptions  Medication Sig Dispense Refill  . atenolol (TENORMIN) 50 MG tablet Take 50 mg by mouth daily. Take 1/4 tablet every day     . Calcium Carbonate-Vitamin D (CALCIUM-VITAMIN D) 500-200 MG-UNIT per tablet Take 1 tablet by mouth daily.    . clobetasol ointment (TEMOVATE) 3.87 % Apply 1 application topically daily.    . clonazePAM (KLONOPIN) 1 MG tablet Take 1 mg by mouth at bedtime.     . clopidogrel (PLAVIX) 75 MG tablet TAKE 1 TABLET BY MOUTH DAILY 30 tablet 1  . denosumab (XGEVA) 120 MG/1.7ML SOLN injection Inject 120 mg into the skin every 6 (six) weeks.    . fentaNYL (DURAGESIC - DOSED MCG/HR) 25 MCG/HR Place 1 patch onto the skin every 3 (three) days.    Marland Kitchen leuprolide (LUPRON) 22.5 MG injection Inject 22.5 mg into the muscle every 3 (three) months.    . nitroGLYCERIN (NITROSTAT) 0.4 MG SL tablet Place 1 tablet (0.4 mg total) under the tongue every 5 (five) minutes as needed for chest pain. 25 tablet 3  . oxyCODONE-acetaminophen (PERCOCET) 10-325 MG per tablet Take 1 tablet by mouth once.     . pantoprazole (PROTONIX) 40 MG tablet Take 40 mg by mouth daily.    . polyethylene glycol (MIRALAX / GLYCOLAX) packet Take 17 g by mouth daily.    Marland Kitchen senna-docusate (EQ SENNA-S) 8.6-50 MG per tablet Take 2 tablets by mouth daily. 60 tablet 3  . simvastatin (  ZOCOR) 40 MG tablet Take 40 mg by mouth at bedtime.      Marland Kitchen venlafaxine XR (EFFEXOR-XR) 150 MG 24 hr capsule Take 150 mg by mouth daily.    . vitamin B-12 (CYANOCOBALAMIN) 1000 MCG tablet Take 1,000 mcg by mouth daily.       No current facility-administered medications for this visit.     Allergies:  Allergies  Allergen Reactions  . Penicillins   . Phenazopyridine Hcl   . Ramipril     Past Medical History, Surgical history, Social history, and Family History were reviewed and updated.   Physical Exam: Blood pressure 124/62, pulse 90, temperature 98.8 F (37.1 C), temperature source Oral, resp. rate 18, height 5\' 9"  (1.753 m),  weight 138 lb 8 oz (62.823 kg), SpO2 100 %. ECOG: 1 General appearance: alert awake not in any distress. Head: Normocephalic, without obvious abnormality, atraumatic Neck: no adenopathy Lymph nodes: Cervical, supraclavicular, and axillary nodes normal. Heart:regular rate and rhythm, S1, S2 normal, no murmur, click, rub or gallop Lung:chest clear, no wheezing, rales, normal symmetric air entry Abdomin: soft, non-tender, without masses or organomegaly no shifting dullness or ascites. EXT:no erythema, induration, or nodules   Lab Results: Lab Results  Component Value Date   WBC 5.6 06/26/2014   HGB 11.8* 06/26/2014   HCT 37.1* 06/26/2014   MCV 90.2 06/26/2014   PLT 170 06/26/2014      Results for URA, HAUSEN (MRN [id number]) as of 06/26/2014 09:47  Ref. Range 09/28/2013 08:21 01/01/2014 08:03 04/09/2014 08:06  PSA Latest Range: <=4.00 ng/mL 0.05 0.08 0.06      Impression and Plan: 74 year old gentleman with the following issues:  1. Hormone sensitive prostate cancer he is currently on Lupron with advanced disease to the bone. His PSA is under excellent control. The plan is to continue with Lupron every 3 months.  2. Bone pain: He requires very little pain medication at this time. We'll continue to monitor him closely for that.  3. Bone directed therapy: Delton See was well tolerated. He will continue his injections every 6 week. 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 He reports very little complications at this time.  4. Follow up in 3 months.    [facility], MD 1/13/20169:57 AM

## 2014-06-27 LAB — PSA: PSA: 0.07 ng/mL (ref <=4.00)

## 2014-08-06 ENCOUNTER — Other Ambulatory Visit (HOSPITAL_BASED_OUTPATIENT_CLINIC_OR_DEPARTMENT_OTHER): Payer: Medicare Other

## 2014-08-06 ENCOUNTER — Ambulatory Visit (HOSPITAL_BASED_OUTPATIENT_CLINIC_OR_DEPARTMENT_OTHER): Payer: Medicare Other

## 2014-08-06 DIAGNOSIS — C61 Malignant neoplasm of prostate: Secondary | ICD-10-CM

## 2014-08-06 DIAGNOSIS — C7951 Secondary malignant neoplasm of bone: Secondary | ICD-10-CM

## 2014-08-06 LAB — COMPREHENSIVE METABOLIC PANEL (CC13)
ALT: 12 U/L (ref 0–55)
ANION GAP: 10 meq/L (ref 3–11)
AST: 18 U/L (ref 5–34)
Albumin: 3.8 g/dL (ref 3.5–5.0)
Alkaline Phosphatase: 21 U/L — ABNORMAL LOW (ref 40–150)
BUN: 11.7 mg/dL (ref 7.0–26.0)
CALCIUM: 9 mg/dL (ref 8.4–10.4)
CO2: 27 meq/L (ref 22–29)
CREATININE: 1 mg/dL (ref 0.7–1.3)
Chloride: 105 mEq/L (ref 98–109)
EGFR: 71 mL/min/{1.73_m2} — ABNORMAL LOW (ref >90)
GLUCOSE: 75 mg/dL (ref 70–140)
POTASSIUM: 3.9 meq/L (ref 3.5–5.1)
Sodium: 142 mEq/L (ref 136–145)
Total Bilirubin: 0.26 mg/dL (ref 0.20–1.20)
Total Protein: 6.4 g/dL (ref 6.4–8.3)

## 2014-08-06 LAB — CBC WITH DIFFERENTIAL/PLATELET
BASO%: 0.8 % (ref 0.0–2.0)
BASOS ABS: 0 10*3/uL (ref 0.0–0.1)
EOS%: 3.9 % (ref 0.0–7.0)
Eosinophils Absolute: 0.2 10*3/uL (ref 0.0–0.5)
HEMATOCRIT: 35.8 % — AB (ref 38.4–49.9)
HGB: 11.7 g/dL — ABNORMAL LOW (ref 13.0–17.1)
LYMPH%: 28.1 % (ref 14.0–49.0)
MCH: 29.5 pg (ref 27.2–33.4)
MCHC: 32.8 g/dL (ref 32.0–36.0)
MCV: 89.9 fL (ref 79.3–98.0)
MONO#: 0.5 10*3/uL (ref 0.1–0.9)
MONO%: 9 % (ref 0.0–14.0)
NEUT#: 3.2 10*3/uL (ref 1.5–6.5)
NEUT%: 58.2 % (ref 39.0–75.0)
PLATELETS: 168 10*3/uL (ref 140–400)
RBC: 3.98 10*6/uL — ABNORMAL LOW (ref 4.20–5.82)
RDW: 14.4 % (ref 11.0–14.6)
WBC: 5.4 10*3/uL (ref 4.0–10.3)
lymph#: 1.5 10*3/uL (ref 0.9–3.3)

## 2014-08-06 MED ORDER — DENOSUMAB 120 MG/1.7ML ~~LOC~~ SOLN
120.0000 mg | Freq: Once | SUBCUTANEOUS | Status: AC
  Administered 2014-08-06: 120 mg via SUBCUTANEOUS
  Filled 2014-08-06: qty 1.7

## 2014-08-06 NOTE — Progress Notes (Signed)
Mr Russell Browning complained about having strange feeling in his feet since his last Lupron injection.  Feels like the bottom of feet have cracks and the top of feet have a rash.  He can't see anything on his feet.  He has put lotion on them.   Dr [name] Blew notified of this.

## 2014-08-07 LAB — PSA: PSA: 0.07 ng/mL (ref <=4.00)

## 2014-08-29 DIAGNOSIS — E782 Mixed hyperlipidemia: Secondary | ICD-10-CM | POA: Diagnosis not present

## 2014-08-29 DIAGNOSIS — C61 Malignant neoplasm of prostate: Secondary | ICD-10-CM | POA: Diagnosis not present

## 2014-08-29 DIAGNOSIS — M545 Low back pain: Secondary | ICD-10-CM | POA: Diagnosis not present

## 2014-08-29 DIAGNOSIS — K219 Gastro-esophageal reflux disease without esophagitis: Secondary | ICD-10-CM | POA: Diagnosis not present

## 2014-08-29 DIAGNOSIS — I10 Essential (primary) hypertension: Secondary | ICD-10-CM | POA: Diagnosis not present

## 2014-08-29 DIAGNOSIS — I2510 Atherosclerotic heart disease of native coronary artery without angina pectoris: Secondary | ICD-10-CM | POA: Diagnosis not present

## 2014-08-29 DIAGNOSIS — G2581 Restless legs syndrome: Secondary | ICD-10-CM | POA: Diagnosis not present

## 2014-08-29 DIAGNOSIS — F325 Major depressive disorder, single episode, in full remission: Secondary | ICD-10-CM | POA: Diagnosis not present

## 2014-09-02 ENCOUNTER — Telehealth: Payer: Self-pay | Admitting: Cardiology

## 2014-09-02 ENCOUNTER — Ambulatory Visit (INDEPENDENT_AMBULATORY_CARE_PROVIDER_SITE_OTHER): Payer: Medicare Other | Admitting: *Deleted

## 2014-09-02 DIAGNOSIS — I495 Sick sinus syndrome: Secondary | ICD-10-CM

## 2014-09-02 NOTE — Telephone Encounter (Signed)
Spoke with pt and reminded pt of remote transmission that is due today. Pt verbalized understanding.   

## 2014-09-02 NOTE — Progress Notes (Signed)
Remote pacemaker transmission.   

## 2014-09-03 LAB — MDC_IDC_ENUM_SESS_TYPE_REMOTE
Battery Impedance: 937 Ohm
Battery Voltage: 2.78 V
Brady Statistic AP VS Percent: 85 %
Date Time Interrogation Session: 20160321170816
Lead Channel Pacing Threshold Amplitude: 1.5 V
Lead Channel Pacing Threshold Pulse Width: 0.4 ms
Lead Channel Sensing Intrinsic Amplitude: 8 mV
Lead Channel Setting Pacing Amplitude: 2 V
Lead Channel Setting Pacing Pulse Width: 0.76 ms
Lead Channel Setting Sensing Sensitivity: 5.6 mV
MDC IDC MSMT BATTERY REMAINING LONGEVITY: 55 mo
MDC IDC MSMT LEADCHNL RA IMPEDANCE VALUE: 151 Ohm
MDC IDC MSMT LEADCHNL RV IMPEDANCE VALUE: 1148 Ohm
MDC IDC SET LEADCHNL RV PACING AMPLITUDE: 2.5 V
MDC IDC STAT BRADY AP VP PERCENT: 0 %
MDC IDC STAT BRADY AS VP PERCENT: 0 %
MDC IDC STAT BRADY AS VS PERCENT: 15 %

## 2014-09-16 ENCOUNTER — Other Ambulatory Visit: Payer: Self-pay | Admitting: *Deleted

## 2014-09-16 DIAGNOSIS — C61 Malignant neoplasm of prostate: Secondary | ICD-10-CM

## 2014-09-17 ENCOUNTER — Other Ambulatory Visit (HOSPITAL_BASED_OUTPATIENT_CLINIC_OR_DEPARTMENT_OTHER): Payer: Medicare Other

## 2014-09-17 ENCOUNTER — Ambulatory Visit (HOSPITAL_BASED_OUTPATIENT_CLINIC_OR_DEPARTMENT_OTHER): Payer: Medicare Other

## 2014-09-17 DIAGNOSIS — C7951 Secondary malignant neoplasm of bone: Secondary | ICD-10-CM

## 2014-09-17 DIAGNOSIS — I2510 Atherosclerotic heart disease of native coronary artery without angina pectoris: Secondary | ICD-10-CM

## 2014-09-17 DIAGNOSIS — C61 Malignant neoplasm of prostate: Secondary | ICD-10-CM | POA: Diagnosis present

## 2014-09-17 DIAGNOSIS — R0602 Shortness of breath: Secondary | ICD-10-CM

## 2014-09-17 LAB — CBC WITH DIFFERENTIAL/PLATELET
BASO%: 0.4 % (ref 0.0–2.0)
BASOS ABS: 0 10*3/uL (ref 0.0–0.1)
EOS ABS: 0.3 10*3/uL (ref 0.0–0.5)
EOS%: 4.5 % (ref 0.0–7.0)
HCT: 35.8 % — ABNORMAL LOW (ref 38.4–49.9)
HEMOGLOBIN: 11.8 g/dL — AB (ref 13.0–17.1)
LYMPH%: 26 % (ref 14.0–49.0)
MCH: 30 pg (ref 27.2–33.4)
MCHC: 33 g/dL (ref 32.0–36.0)
MCV: 91.1 fL (ref 79.3–98.0)
MONO#: 0.6 10*3/uL (ref 0.1–0.9)
MONO%: 9.9 % (ref 0.0–14.0)
NEUT%: 59.2 % (ref 39.0–75.0)
NEUTROS ABS: 3.3 10*3/uL (ref 1.5–6.5)
PLATELETS: 162 10*3/uL (ref 140–400)
RBC: 3.93 10*6/uL — ABNORMAL LOW (ref 4.20–5.82)
RDW: 13.3 % (ref 11.0–14.6)
WBC: 5.6 10*3/uL (ref 4.0–10.3)
lymph#: 1.5 10*3/uL (ref 0.9–3.3)

## 2014-09-17 LAB — COMPREHENSIVE METABOLIC PANEL (CC13)
ALT: 11 U/L (ref 0–55)
AST: 15 U/L (ref 5–34)
Albumin: 4.1 g/dL (ref 3.5–5.0)
Alkaline Phosphatase: 19 U/L — ABNORMAL LOW (ref 40–150)
Anion Gap: 9 mEq/L (ref 3–11)
BILIRUBIN TOTAL: 0.35 mg/dL (ref 0.20–1.20)
BUN: 15.4 mg/dL (ref 7.0–26.0)
CALCIUM: 9.3 mg/dL (ref 8.4–10.4)
CO2: 27 mEq/L (ref 22–29)
CREATININE: 1.1 mg/dL (ref 0.7–1.3)
Chloride: 105 mEq/L (ref 98–109)
EGFR: 69 mL/min/{1.73_m2} — AB (ref >90)
Glucose: 70 mg/dl (ref 70–140)
Potassium: 4 mEq/L (ref 3.5–5.1)
SODIUM: 141 meq/L (ref 136–145)
TOTAL PROTEIN: 6.6 g/dL (ref 6.4–8.3)

## 2014-09-17 MED ORDER — DENOSUMAB 120 MG/1.7ML ~~LOC~~ SOLN
120.0000 mg | Freq: Once | SUBCUTANEOUS | Status: AC
  Administered 2014-09-17: 120 mg via SUBCUTANEOUS
  Filled 2014-09-17: qty 1.7

## 2014-09-19 ENCOUNTER — Encounter: Payer: Self-pay | Admitting: Cardiology

## 2014-09-23 ENCOUNTER — Encounter: Payer: Self-pay | Admitting: Internal Medicine

## 2014-09-25 ENCOUNTER — Ambulatory Visit (HOSPITAL_BASED_OUTPATIENT_CLINIC_OR_DEPARTMENT_OTHER): Payer: Medicare Other | Admitting: Oncology

## 2014-09-25 ENCOUNTER — Other Ambulatory Visit: Payer: Self-pay | Admitting: Oncology

## 2014-09-25 ENCOUNTER — Other Ambulatory Visit: Payer: Medicare Other

## 2014-09-25 ENCOUNTER — Telehealth: Payer: Self-pay | Admitting: Oncology

## 2014-09-25 ENCOUNTER — Ambulatory Visit (HOSPITAL_BASED_OUTPATIENT_CLINIC_OR_DEPARTMENT_OTHER): Payer: Medicare Other

## 2014-09-25 VITALS — BP 136/76 | HR 91 | Temp 98.6°F | Resp 18 | Ht 69.0 in | Wt 141.6 lb

## 2014-09-25 DIAGNOSIS — C7951 Secondary malignant neoplasm of bone: Secondary | ICD-10-CM

## 2014-09-25 DIAGNOSIS — G629 Polyneuropathy, unspecified: Secondary | ICD-10-CM | POA: Diagnosis not present

## 2014-09-25 DIAGNOSIS — C61 Malignant neoplasm of prostate: Secondary | ICD-10-CM

## 2014-09-25 DIAGNOSIS — Z5111 Encounter for antineoplastic chemotherapy: Secondary | ICD-10-CM

## 2014-09-25 DIAGNOSIS — M898X9 Other specified disorders of bone, unspecified site: Secondary | ICD-10-CM | POA: Diagnosis not present

## 2014-09-25 MED ORDER — LEUPROLIDE ACETATE (3 MONTH) 22.5 MG IM KIT
22.5000 mg | PACK | Freq: Once | INTRAMUSCULAR | Status: AC
  Administered 2014-09-25: 22.5 mg via INTRAMUSCULAR
  Filled 2014-09-25: qty 22.5

## 2014-09-25 NOTE — Telephone Encounter (Signed)
gave and prnted appt sched and avs for pt for JULY.

## 2014-09-25 NOTE — Progress Notes (Signed)
Hematology and Oncology Follow Up Visit  Russell Browning [id number] 26-Jun-1940 74 y.o. 09/25/2014 8:57 AM    Principle Diagnosis: This is a pleasant 74 year old gentleman with  advanced prostate cancer. He has metastatic disease to the bone. It appears to be hormone sensitive at this time. His cancer was inially diagnosed in 2000.    Prior Therapy: 1. He received radiation therapy and 2 years of Lupron, but it presumably was locally advanced prostate cancer at the time of diagnosis in 2000.  2. PSA was under control from 2001 up until the early parts of 2012, where his PSA rose up to 16.9. At that time, he was under the care of Dr. 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 He subsequently started on  Lupron every 3 months.  3. He is S/P radiation therapy directed to the right hip (30 gray in 12 fractions) concluded in 07/2011 for palliative purposes conducted in [place].    Current therapy:  1. Lupron 22.5 mg every three months.  2. Delton See given every 4 to 6 weeks started in 10/2011.   Interim History:  Mr. Anglemyer presents today for a follow up visit with his wife. Since his last visit, he reports doing well overall. 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 He reports a slight fatigue and tiredness associated with Lupron injection that lasts for a few days. After that, he resumes all activities of daily living without any decline. He is working in the garden implanting without any decline.  He does not report any headaches, blurry vision, syncope or seizures. He does not report any fevers or chills or sweats. Is not reporting any chest pain or difficulty breathing. Has not reported any nausea or vomiting. Has not reported any palpitation or leg edema. He is not reporting any change in his bowel habits. Has not reported any urinary symptoms. Remainder of his  review of systems unremarkable.   Medications: I have reviewed the patient's current medications.   Current Outpatient Prescriptions  Medication Sig Dispense Refill  . atenolol (TENORMIN) 50 MG tablet Take 50 mg by mouth daily. Take 1/4 tablet every day     . Calcium Carbonate-Vitamin D (CALCIUM-VITAMIN D) 500-200 MG-UNIT per tablet Take 1 tablet by mouth daily.    . clobetasol ointment (TEMOVATE) 4.13 % Apply 1 application topically daily.    . clonazePAM (KLONOPIN) 1 MG tablet Take 1 mg by mouth at bedtime.     . clopidogrel (PLAVIX) 75 MG tablet TAKE 1 TABLET BY MOUTH DAILY 30 tablet 1  . denosumab (XGEVA) 120 MG/1.7ML SOLN injection Inject 120 mg into the skin every 6 (six) weeks.    . fentaNYL (DURAGESIC - DOSED MCG/HR) 25 MCG/HR Place 1 patch onto the skin every 3 (three) days.    Marland Kitchen leuprolide (LUPRON) 22.5 MG injection Inject 22.5 mg into the muscle every 3 (three) months.    . nitroGLYCERIN (NITROSTAT) 0.4 MG SL tablet Place 1 tablet (0.4 mg total) under the tongue every 5 (five) minutes as needed for chest pain. 25 tablet 3  . oxyCODONE-acetaminophen (PERCOCET) 10-325 MG per tablet Take 1 tablet by mouth once.     . pantoprazole (PROTONIX) 40 MG tablet Take 40 mg by mouth daily.    . polyethylene glycol (MIRALAX / GLYCOLAX) packet Take 17 g by mouth daily.    Marland Kitchen  senna-docusate (EQ SENNA-S) 8.6-50 MG per tablet Take 2 tablets by mouth daily. 60 tablet 3  . simvastatin (ZOCOR) 40 MG tablet Take 40 mg by mouth at bedtime.      Marland Kitchen venlafaxine XR (EFFEXOR-XR) 150 MG 24 hr capsule Take 150 mg by mouth daily.    . vitamin B-12 (CYANOCOBALAMIN) 1000 MCG tablet Take 1,000 mcg by mouth daily.       No current facility-administered medications for this visit.     Allergies:  Allergies  Allergen Reactions  . Penicillins   . Phenazopyridine Hcl   . Ramipril     Past Medical History, Surgical history, Social history, and Family History were reviewed and updated.   Physical  Exam: Blood pressure 136/76, pulse 91, temperature 98.6 F (37 C), temperature source Oral, resp. rate 18, height 5\' 9"  (1.753 m), weight 141 lb 9.6 oz (64.229 kg), SpO2 100 %. ECOG: 1 General appearance: alert awake not in any distress. Head: Normocephalic, without obvious abnormality Neck: no adenopathy Lymph nodes: Cervical, supraclavicular, and axillary nodes normal. Heart:regular rate and rhythm, S1, S2 normal, no murmur, click, rub or gallop Lung:chest clear, no wheezing, rales, normal symmetric air entry Abdomin: soft, non-tender, without masses or organomegaly no shifting dullness or ascites. EXT:no erythema, induration, or nodules   Lab Results: Lab Results  Component Value Date   WBC 5.6 09/17/2014   HGB 11.8* 09/17/2014   HCT 35.8* 09/17/2014   MCV 91.1 09/17/2014   PLT 162 09/17/2014    Results for [employer] (MRN [id number]) as of 09/25/2014 08:59  Ref. Range 04/09/2014 08:06 06/26/2014 09:27 08/06/2014 07:51  PSA Latest Ref Range: <=4.00 ng/mL 0.06 0.07 0.07        Impression and Plan: 74 year old gentleman with the following issues:  1. Hormone sensitive prostate cancer he is currently on Lupron with advanced disease to the bone. His PSA is under excellent control and continues to be very low at 0.07. The plan is to continue with Lupron every 3 months.   2. Bone pain: His pain is under excellent control with fentanyl patch and Percocet. He takes 1 breakthrough pain medication at nighttime to help him sleep.  3. Bone directed therapy: Delton See have caused him some occasional neuropathy. I will change the frequency of this medication to every 3 months moving forward. 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 .  4. Follow up in 3 months.    [facility], MD 4/13/20168:57 AM

## 2014-11-15 ENCOUNTER — Telehealth: Payer: Self-pay | Admitting: *Deleted

## 2014-11-15 NOTE — Telephone Encounter (Signed)
Attempted to return pt call. No answer will try again on Monday

## 2014-11-15 NOTE — Telephone Encounter (Signed)
Please contact this patient r/e a letter he received about upgrading his equipment. This patient doesn't have access to the internet and is requesting help.

## 2014-11-19 NOTE — Telephone Encounter (Signed)
Wirex ordered pt aware he will receive wirex around end of July or the first of August.

## 2014-11-21 ENCOUNTER — Encounter: Payer: Self-pay | Admitting: *Deleted

## 2014-11-26 ENCOUNTER — Ambulatory Visit (INDEPENDENT_AMBULATORY_CARE_PROVIDER_SITE_OTHER): Payer: Medicare Other | Admitting: Cardiology

## 2014-11-26 ENCOUNTER — Encounter: Payer: Self-pay | Admitting: Cardiology

## 2014-11-26 VITALS — BP 102/70 | HR 85 | Ht 70.0 in | Wt 150.0 lb

## 2014-11-26 DIAGNOSIS — I495 Sick sinus syndrome: Secondary | ICD-10-CM

## 2014-11-26 DIAGNOSIS — I2510 Atherosclerotic heart disease of native coronary artery without angina pectoris: Secondary | ICD-10-CM | POA: Diagnosis not present

## 2014-11-26 DIAGNOSIS — E782 Mixed hyperlipidemia: Secondary | ICD-10-CM

## 2014-11-26 NOTE — Patient Instructions (Signed)
Your physician recommends that you continue on your current medications as directed. Please refer to the Current Medication list given to you today. Your physician recommends that you schedule a follow-up appointment in: 6 months. You will receive a reminder letter in the mail in about 4 months reminding you to call and schedule your appointment. If you don't receive this letter, please contact our office. 

## 2014-11-26 NOTE — Progress Notes (Signed)
Cardiology Office Note  Date: 11/26/2014   ID: Tredarius Cobern, DOB 1940-06-29, MRN [id number]  PCP: Gar Ponto, MD  Primary Cardiologist: Rozann Lesches, MD   Chief Complaint  Patient presents with  . Coronary Artery Disease  . Hyperlipidemia    History of Present Illness: Lester Crickenberger is a 74 y.o. male seen by me for the first time in December 2015. At that time he was referred for follow-up ischemic testing which is outlined below. He presents today for a routine follow-up visit with his granddaughter. He reports fatigue, sometimes some discomfort in his left arm and shoulder when he sleeps on his left side, but otherwise no exertional chest pain or palpitations.  He continues to follow with Dr. Rayann Heman, states that he does remote device interrogations from home as well.  We reviewed his medications, no change from a cardiac perspective. He continues to follow up with Dr. Quillian Quince for lipid assessment. 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   Past Medical History  Diagnosis Date  . History of TIAs   . Mixed hyperlipidemia   . Sinus node dysfunction     Medtronic pacemaker - Dr. Rayann Heman  . CAD (coronary artery disease), native coronary artery     DES to proximal, mid, and distal RCA 2006  . 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 Prostate cancer metastatic to bone      Current Outpatient Prescriptions  Medication Sig Dispense Refill  . atenolol (TENORMIN) 50 MG tablet Take 50 mg by mouth daily. Take 1/4 tablet every day     . Calcium Carbonate-Vitamin D (CALCIUM-VITAMIN D) 500-200 MG-UNIT per tablet Take 1 tablet by mouth daily.    . clobetasol ointment (TEMOVATE) 0.81 % Apply 1 application topically daily.    . clonazePAM (KLONOPIN) 1 MG tablet Take 1 mg by mouth at bedtime.     . clopidogrel (PLAVIX) 75 MG tablet TAKE 1 TABLET  BY MOUTH DAILY 30 tablet 1  . denosumab (XGEVA) 120 MG/1.7ML SOLN injection Inject 120 mg into the skin every 6 (six) weeks.    . fentaNYL (DURAGESIC - DOSED MCG/HR) 25 MCG/HR Place 1 patch onto the skin every 3 (three) days.    Marland Kitchen leuprolide (LUPRON) 22.5 MG injection Inject 22.5 mg into the muscle every 3 (three) months.    . nitroGLYCERIN (NITROSTAT) 0.4 MG SL tablet Place 1 tablet (0.4 mg total) under the tongue every 5 (five) minutes as needed for chest pain. 25 tablet 3  . oxyCODONE-acetaminophen (PERCOCET) 10-325 MG per tablet Take 1 tablet by mouth once.     . pantoprazole (PROTONIX) 40 MG tablet Take 40 mg by mouth daily.    . polyethylene glycol (MIRALAX / GLYCOLAX) packet Take 17 g by mouth daily.    Marland Kitchen senna-docusate (EQ SENNA-S) 8.6-50 MG per tablet Take 2 tablets by mouth daily. 60 tablet 3  . simvastatin (ZOCOR) 40 MG tablet Take 40 mg by mouth at bedtime.      Marland Kitchen venlafaxine XR (EFFEXOR-XR) 150 MG 24 hr capsule Take 150 mg by mouth daily.    . vitamin B-12 (CYANOCOBALAMIN) 1000 MCG tablet Take 1,000 mcg by mouth daily.       No current facility-administered medications for this visit.    Allergies:  Penicillins; Phenazopyridine hcl; and Ramipril   Social History: The patient  reports  that he quit smoking about 49 years ago. His smoking use included Cigarettes. He started smoking about 63 years ago. He has a 5 pack-year smoking history. He quit smokeless tobacco use about 36 years ago. His smokeless tobacco use included Chew. He reports that he does not drink alcohol or use illicit drugs.   ROS:  Please see the history of present illness. Otherwise, complete review of systems is positive for none.  All other systems are reviewed and negative.   Physical Exam: VS:  BP 102/70 mmHg  Pulse 85  Ht 5\' 10"  (1.778 m)  Wt 150 lb (68.04 kg)  BMI 21.52 kg/m2  SpO2 97%, BMI Body mass index is 21.52 kg/(m^2).  Wt Readings from Last 3 Encounters:  11/26/14 150 lb (68.04 kg)  09/25/14  141 lb 9.6 oz (64.229 kg)  06/26/14 138 lb 8 oz (62.823 kg)     Patient appears comfortable at rest. HEENT: Conjunctiva and lids normal, oropharynx clear. Neck: Supple, no elevated JVP or carotid bruits, no thyromegaly. Thorax: Device pocket site well-healed. Lungs: Clear to auscultation, nonlabored breathing at rest. Cardiac: Regular rate and rhythm, no S3, soft systolic murmur. Abdomen: Soft, nontender, bowel sounds present, no guarding or rebound. Extremities: No pitting edema, distal pulses 2+. Skin: Warm and dry. Musculoskeletal: No kyphosis. Neuropsychiatric: Alert and oriented x3, affect grossly appropriate.   ECG: ECG is not ordered today.   Recent Labwork: 09/17/2014: ALT 11; AST 15; BUN 15.4; Creatinine 1.1; HGB 11.8*; Platelets 162; Potassium 4.0; Sodium 141  05/10/2014: Cholesterol 176, triglycerides 152, HDL 49, LDL 97  Other Studies Reviewed Today:  Lexiscan Cardiolite 06/13/2014: FINDINGS: Stress data: Baseline EKG SB 55 bpm With infusion of lexiscan there were no ST changes to suggest ischemia  Nuclear data: In the initial stress images there was normal perfusion with minimal inferoseptal thinning. Note underlying bowel activity.  In the recovery images there was no significant change.  ON gating LVEF was calculated at 46% with overall normal wall thickening. On visual estimate, LVEF appears better  IMPRESSION: Lexiscan Sestamibi: Electrically negative for ischemia MIBI scan with normal perfusion. LVEF calculated at 46%; visually appears greater. Overall low risk scan   Assessment and Plan:  1. CAD status post previous DES placement to the proximal, mid, and distal RCA in 2006. Ischemic testing from within the last 6 months was overall low risk. Plan is to continue medical therapy and observation.  2. Sinus node dysfunction status post Medtronic pacemaker, followed by Dr. Rayann Heman.  3. Hyperlipidemia, on Zocor  Current medicines were reviewed  with the patient today.  Disposition: FU with me in 6 months.   Signed, Satira Sark, MD, [facility] 11/26/2014 10:30 AM    [address] [facility] [place], [place], [place] [address] Phone: [phone]; Fax: [phone]

## 2014-12-04 ENCOUNTER — Telehealth: Payer: Self-pay | Admitting: Cardiology

## 2014-12-04 ENCOUNTER — Encounter: Payer: Medicare Other | Admitting: *Deleted

## 2014-12-04 NOTE — Telephone Encounter (Signed)
Attempted to confirm remote transmission with pt. No answer and was unable to leave a message.   

## 2014-12-05 ENCOUNTER — Encounter: Payer: Self-pay | Admitting: Cardiology

## 2014-12-11 ENCOUNTER — Encounter: Payer: Self-pay | Admitting: Internal Medicine

## 2014-12-11 ENCOUNTER — Ambulatory Visit (INDEPENDENT_AMBULATORY_CARE_PROVIDER_SITE_OTHER): Payer: Medicare Other | Admitting: *Deleted

## 2014-12-11 DIAGNOSIS — I495 Sick sinus syndrome: Secondary | ICD-10-CM | POA: Diagnosis not present

## 2014-12-12 NOTE — Progress Notes (Signed)
Remote pacemaker transmission.   

## 2014-12-20 LAB — CUP PACEART REMOTE DEVICE CHECK
Battery Impedance: 1096 Ohm
Battery Remaining Longevity: 50 mo
Battery Voltage: 2.78 V
Brady Statistic AP VP Percent: 0 %
Brady Statistic AP VS Percent: 86 %
Date Time Interrogation Session: 20160629170558
Lead Channel Pacing Threshold Amplitude: 1.75 V
Lead Channel Pacing Threshold Pulse Width: 0.4 ms
Lead Channel Setting Pacing Amplitude: 2 V
Lead Channel Setting Pacing Pulse Width: 0.76 ms
Lead Channel Setting Sensing Sensitivity: 5.6 mV
MDC IDC MSMT LEADCHNL RA IMPEDANCE VALUE: 149 Ohm
MDC IDC MSMT LEADCHNL RV IMPEDANCE VALUE: 1245 Ohm
MDC IDC MSMT LEADCHNL RV SENSING INTR AMPL: 11.2 mV
MDC IDC SET LEADCHNL RV PACING AMPLITUDE: 2.5 V
MDC IDC STAT BRADY AS VP PERCENT: 0 %
MDC IDC STAT BRADY AS VS PERCENT: 14 %

## 2014-12-25 ENCOUNTER — Ambulatory Visit (HOSPITAL_BASED_OUTPATIENT_CLINIC_OR_DEPARTMENT_OTHER): Payer: Medicare Other

## 2014-12-25 ENCOUNTER — Other Ambulatory Visit (HOSPITAL_BASED_OUTPATIENT_CLINIC_OR_DEPARTMENT_OTHER): Payer: Medicare Other

## 2014-12-25 ENCOUNTER — Encounter: Payer: Self-pay | Admitting: Cardiology

## 2014-12-25 VITALS — BP 125/70 | HR 91 | Temp 98.4°F

## 2014-12-25 DIAGNOSIS — C7951 Secondary malignant neoplasm of bone: Secondary | ICD-10-CM | POA: Diagnosis not present

## 2014-12-25 DIAGNOSIS — C61 Malignant neoplasm of prostate: Secondary | ICD-10-CM | POA: Diagnosis not present

## 2014-12-25 LAB — COMPREHENSIVE METABOLIC PANEL (CC13)
ALT: 13 U/L (ref 0–55)
AST: 18 U/L (ref 5–34)
Albumin: 4 g/dL (ref 3.5–5.0)
Alkaline Phosphatase: 21 U/L — ABNORMAL LOW (ref 40–150)
Anion Gap: 7 mEq/L (ref 3–11)
BUN: 8.4 mg/dL (ref 7.0–26.0)
CALCIUM: 9.7 mg/dL (ref 8.4–10.4)
CHLORIDE: 103 meq/L (ref 98–109)
CO2: 29 mEq/L (ref 22–29)
CREATININE: 1 mg/dL (ref 0.7–1.3)
EGFR: 76 mL/min/{1.73_m2} — ABNORMAL LOW (ref >90)
GLUCOSE: 104 mg/dL (ref 70–140)
Potassium: 4.1 mEq/L (ref 3.5–5.1)
Sodium: 139 mEq/L (ref 136–145)
Total Bilirubin: 0.28 mg/dL (ref 0.20–1.20)
Total Protein: 6.3 g/dL — ABNORMAL LOW (ref 6.4–8.3)

## 2014-12-25 LAB — CBC WITH DIFFERENTIAL/PLATELET
BASO%: 0.4 % (ref 0.0–2.0)
Basophils Absolute: 0 10*3/uL (ref 0.0–0.1)
EOS%: 4.1 % (ref 0.0–7.0)
Eosinophils Absolute: 0.2 10*3/uL (ref 0.0–0.5)
HEMATOCRIT: 34 % — AB (ref 38.4–49.9)
HGB: 11.6 g/dL — ABNORMAL LOW (ref 13.0–17.1)
LYMPH%: 31.7 % (ref 14.0–49.0)
MCH: 30.2 pg (ref 27.2–33.4)
MCHC: 34 g/dL (ref 32.0–36.0)
MCV: 88.8 fL (ref 79.3–98.0)
MONO#: 0.4 10*3/uL (ref 0.1–0.9)
MONO%: 8.5 % (ref 0.0–14.0)
NEUT%: 55.3 % (ref 39.0–75.0)
NEUTROS ABS: 2.9 10*3/uL (ref 1.5–6.5)
Platelets: 159 10*3/uL (ref 140–400)
RBC: 3.83 10*6/uL — AB (ref 4.20–5.82)
RDW: 13.6 % (ref 11.0–14.6)
WBC: 5.2 10*3/uL (ref 4.0–10.3)
lymph#: 1.6 10*3/uL (ref 0.9–3.3)

## 2014-12-25 MED ORDER — DENOSUMAB 120 MG/1.7ML ~~LOC~~ SOLN
120.0000 mg | Freq: Once | SUBCUTANEOUS | Status: AC
  Administered 2014-12-25: 120 mg via SUBCUTANEOUS
  Filled 2014-12-25: qty 1.7

## 2014-12-26 LAB — PSA: PSA: 0.07 ng/mL (ref <=4.00)

## 2015-01-01 ENCOUNTER — Ambulatory Visit (HOSPITAL_BASED_OUTPATIENT_CLINIC_OR_DEPARTMENT_OTHER): Payer: Medicare Other | Admitting: Oncology

## 2015-01-01 ENCOUNTER — Telehealth: Payer: Self-pay | Admitting: Oncology

## 2015-01-01 ENCOUNTER — Ambulatory Visit (HOSPITAL_BASED_OUTPATIENT_CLINIC_OR_DEPARTMENT_OTHER): Payer: Medicare Other

## 2015-01-01 VITALS — BP 127/60 | HR 86 | Temp 97.5°F | Resp 18 | Ht 70.0 in | Wt 140.0 lb

## 2015-01-01 DIAGNOSIS — M898X9 Other specified disorders of bone, unspecified site: Secondary | ICD-10-CM

## 2015-01-01 DIAGNOSIS — C61 Malignant neoplasm of prostate: Secondary | ICD-10-CM | POA: Diagnosis not present

## 2015-01-01 DIAGNOSIS — Z5111 Encounter for antineoplastic chemotherapy: Secondary | ICD-10-CM | POA: Diagnosis present

## 2015-01-01 DIAGNOSIS — C7951 Secondary malignant neoplasm of bone: Secondary | ICD-10-CM

## 2015-01-01 MED ORDER — LEUPROLIDE ACETATE (3 MONTH) 22.5 MG IM KIT
22.5000 mg | PACK | Freq: Once | INTRAMUSCULAR | Status: AC
  Administered 2015-01-01: 22.5 mg via INTRAMUSCULAR
  Filled 2015-01-01: qty 22.5

## 2015-01-01 NOTE — Telephone Encounter (Signed)
Gave and printed appt sched and avs fo rpt for OCT °

## 2015-01-01 NOTE — Progress Notes (Signed)
Hematology and Oncology Follow Up Visit  Russell Browning [id number] 1941-01-23 74 y.o. 01/01/2015 8:15 AM    Principle Diagnosis: This is a pleasant 74 year old gentleman with  advanced prostate cancer. He has metastatic disease to the bone. It appears to be hormone sensitive at this time. His cancer was inially diagnosed in 2000.    Prior Therapy: 1. He received radiation therapy and 2 years of Lupron, but it presumably was locally advanced prostate cancer at the time of diagnosis in 2000.  2. PSA was under control from 2001 up until the early parts of 2012, where his PSA rose up to 16.9. At that time, he was under the care of Dr. 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 He subsequently started on  Lupron every 3 months.  3. He is S/P radiation therapy directed to the right hip (30 gray in 12 fractions) concluded in 07/2011 for palliative purposes conducted in [place].    Current therapy:  1. Lupron 22.5 mg every three months.  2. Delton See given every 3 months started in 10/2011.   Interim History:  Russell Browning presents today for a follow up visit. Since his last visit, he reports doing well. He did have complaints of shoulder pain as well as chest pain and was evaluated by cardiology and ruled out cardiac causes. This has been attributed mostly to muscular problems. He reports a slight fatigue and tiredness associated with Lupron injection that lasts for a few days. He continues to perform all activities of daily living without any decline. He is working in the garden without any decline.  He does not report any headaches, blurry vision, syncope or seizures. He does not report any fevers or chills or sweats. Is not reporting any chest pain or difficulty breathing. Has not reported any nausea or vomiting. Has not reported any palpitation or leg edema. He is not reporting any change in his bowel habits. Has not  reported any urinary symptoms. Remainder of his review of systems unremarkable.   Medications: I have reviewed the patient's current medications.   Current Outpatient Prescriptions  Medication Sig Dispense Refill  . atenolol (TENORMIN) 50 MG tablet Take 50 mg by mouth daily. Take 1/4 tablet every day     . Calcium Carbonate-Vitamin D (CALCIUM-VITAMIN D) 500-200 MG-UNIT per tablet Take 1 tablet by mouth daily.    . clobetasol ointment (TEMOVATE) 0.86 % Apply 1 application topically daily.    . clonazePAM (KLONOPIN) 1 MG tablet Take 1 mg by mouth at bedtime.     . clopidogrel (PLAVIX) 75 MG tablet TAKE 1 TABLET BY MOUTH DAILY 30 tablet 1  . denosumab (XGEVA) 120 MG/1.7ML SOLN injection Inject 120 mg into the skin every 6 (six) weeks.    . fentaNYL (DURAGESIC - DOSED MCG/HR) 25 MCG/HR Place 1 patch onto the skin every 3 (three) days.    Marland Kitchen leuprolide (LUPRON) 22.5 MG injection Inject 22.5 mg into the muscle every 3 (three) months.    . nitroGLYCERIN (NITROSTAT) 0.4 MG SL tablet Place 1 tablet (0.4 mg total) under the tongue every 5 (five) minutes as needed for chest pain. 25 tablet 3  . oxyCODONE-acetaminophen (PERCOCET) 10-325 MG per tablet Take 1 tablet by mouth once.     . pantoprazole (PROTONIX) 40 MG tablet Take 40 mg by mouth daily.    . polyethylene glycol (MIRALAX / GLYCOLAX) packet Take  17 g by mouth daily.    Marland Kitchen senna-docusate (EQ SENNA-S) 8.6-50 MG per tablet Take 2 tablets by mouth daily. 60 tablet 3  . simvastatin (ZOCOR) 40 MG tablet Take 40 mg by mouth at bedtime.      Marland Kitchen venlafaxine XR (EFFEXOR-XR) 150 MG 24 hr capsule Take 150 mg by mouth daily.    . vitamin B-12 (CYANOCOBALAMIN) 1000 MCG tablet Take 1,000 mcg by mouth daily.       No current facility-administered medications for this visit.     Allergies:  Allergies  Allergen Reactions  . Penicillins   . Phenazopyridine Hcl   . Ramipril     Past Medical History, Surgical history, Social history, and Family History  were reviewed and updated.   Physical Exam: Blood pressure 127/60, pulse 86, temperature 97.5 F (36.4 C), temperature source Oral, resp. rate 18, height 5\' 10"  (1.778 m), weight 140 lb (63.504 kg), SpO2 99 %. ECOG: 1 General appearance: alert awake well-appearing gentleman. Head: Normocephalic, without obvious abnormality Neck: no adenopathy Lymph nodes: Cervical, supraclavicular, and axillary nodes normal. Heart:regular rate and rhythm, S1, S2 normal, no murmur, click, rub or gallop Lung:chest clear, no wheezing, rales, normal symmetric air entry Abdomin: soft, non-tender, without masses or organomegaly no shifting dullness or ascites. EXT:no erythema, induration, or nodules   Lab Results: Lab Results  Component Value Date   WBC 5.2 12/25/2014   HGB 11.6* 12/25/2014   HCT 34.0* 12/25/2014   MCV 88.8 12/25/2014   PLT 159 12/25/2014     Results for [employer] (MRN [id number]) as of 01/01/2015 08:07  Ref. Range 08/06/2014 07:51 12/25/2014 07:59  PSA Latest Ref Range: <=4.00 ng/mL 0.07 0.07        Impression and Plan: 74 year old gentleman with the following issues:  1. Hormone sensitive prostate cancer he is currently on Lupron with advanced disease to the bone. His PSA is under excellent control and continues to be very low at 0.07. The plan is to continue with Lupron every 3 months. We will consider adding second line hormonal therapy if his PSA starts to rise. At that point, he will need staging workup.  2. Bone pain: His pain is under excellent control with fentanyl patch and Percocet. He takes very little breakthrough pain medication at nighttime to help him sleep.  3. Bone directed therapy: Delton See have caused him some occasional neuropathy. This will be administered every 3 months moving forward.  4. Follow up in 3 months.    [facility], MD 7/20/20168:15 AM

## 2015-01-03 ENCOUNTER — Inpatient Hospital Stay (HOSPITAL_COMMUNITY)
Admission: EM | Admit: 2015-01-03 | Discharge: 2015-01-07 | DRG: 251 | Disposition: A | Discharge status: Home / Self Care | Payer: Medicare Other | Attending: Cardiovascular Disease | Admitting: Cardiovascular Disease

## 2015-01-03 ENCOUNTER — Encounter (HOSPITAL_COMMUNITY): Payer: Self-pay | Admitting: *Deleted

## 2015-01-03 ENCOUNTER — Emergency Department (HOSPITAL_COMMUNITY): Payer: Medicare Other

## 2015-01-03 ENCOUNTER — Other Ambulatory Visit: Payer: Self-pay

## 2015-01-03 DIAGNOSIS — Z950 Presence of cardiac pacemaker: Secondary | ICD-10-CM | POA: Diagnosis not present

## 2015-01-03 DIAGNOSIS — I209 Angina pectoris, unspecified: Secondary | ICD-10-CM | POA: Diagnosis not present

## 2015-01-03 DIAGNOSIS — J449 Chronic obstructive pulmonary disease, unspecified: Secondary | ICD-10-CM | POA: Diagnosis not present

## 2015-01-03 DIAGNOSIS — Z79818 Long term (current) use of other agents affecting estrogen receptors and estrogen levels: Secondary | ICD-10-CM

## 2015-01-03 DIAGNOSIS — I25110 Atherosclerotic heart disease of native coronary artery with unstable angina pectoris: Secondary | ICD-10-CM

## 2015-01-03 DIAGNOSIS — Z8673 Personal history of transient ischemic attack (TIA), and cerebral infarction without residual deficits: Secondary | ICD-10-CM | POA: Diagnosis not present

## 2015-01-03 DIAGNOSIS — I495 Sick sinus syndrome: Secondary | ICD-10-CM | POA: Diagnosis present

## 2015-01-03 DIAGNOSIS — D649 Anemia, unspecified: Secondary | ICD-10-CM | POA: Diagnosis present

## 2015-01-03 DIAGNOSIS — I2510 Atherosclerotic heart disease of native coronary artery without angina pectoris: Secondary | ICD-10-CM | POA: Diagnosis present

## 2015-01-03 DIAGNOSIS — Z87891 Personal history of nicotine dependence: Secondary | ICD-10-CM | POA: Diagnosis not present

## 2015-01-03 DIAGNOSIS — T82858A Stenosis of vascular prosthetic devices, implants and grafts, initial encounter: Principal | ICD-10-CM | POA: Diagnosis present

## 2015-01-03 DIAGNOSIS — I200 Unstable angina: Secondary | ICD-10-CM | POA: Diagnosis not present

## 2015-01-03 DIAGNOSIS — I2089 Other forms of angina pectoris: Secondary | ICD-10-CM | POA: Insufficient documentation

## 2015-01-03 DIAGNOSIS — Z888 Allergy status to other drugs, medicaments and biological substances status: Secondary | ICD-10-CM | POA: Diagnosis not present

## 2015-01-03 DIAGNOSIS — C61 Malignant neoplasm of prostate: Secondary | ICD-10-CM | POA: Diagnosis present

## 2015-01-03 DIAGNOSIS — R079 Chest pain, unspecified: Secondary | ICD-10-CM | POA: Diagnosis not present

## 2015-01-03 DIAGNOSIS — E782 Mixed hyperlipidemia: Secondary | ICD-10-CM | POA: Diagnosis not present

## 2015-01-03 DIAGNOSIS — Z8249 Family history of ischemic heart disease and other diseases of the circulatory system: Secondary | ICD-10-CM

## 2015-01-03 DIAGNOSIS — Z880 Allergy status to penicillin: Secondary | ICD-10-CM

## 2015-01-03 DIAGNOSIS — I208 Other forms of angina pectoris: Secondary | ICD-10-CM | POA: Insufficient documentation

## 2015-01-03 DIAGNOSIS — Z79891 Long term (current) use of opiate analgesic: Secondary | ICD-10-CM | POA: Diagnosis not present

## 2015-01-03 DIAGNOSIS — C7951 Secondary malignant neoplasm of bone: Secondary | ICD-10-CM | POA: Diagnosis present

## 2015-01-03 DIAGNOSIS — R072 Precordial pain: Secondary | ICD-10-CM | POA: Diagnosis not present

## 2015-01-03 DIAGNOSIS — Z7902 Long term (current) use of antithrombotics/antiplatelets: Secondary | ICD-10-CM | POA: Diagnosis not present

## 2015-01-03 HISTORY — DX: Anemia, unspecified: D64.9

## 2015-01-03 LAB — BASIC METABOLIC PANEL
Anion gap: 7 (ref 5–15)
BUN: 13 mg/dL (ref 6–20)
CALCIUM: 9.2 mg/dL (ref 8.9–10.3)
CO2: 28 mmol/L (ref 22–32)
CREATININE: 0.9 mg/dL (ref 0.61–1.24)
Chloride: 101 mmol/L (ref 101–111)
Glucose, Bld: 100 mg/dL — ABNORMAL HIGH (ref 65–99)
Potassium: 4 mmol/L (ref 3.5–5.1)
SODIUM: 136 mmol/L (ref 135–145)

## 2015-01-03 LAB — CBC
HCT: 38.1 % — ABNORMAL LOW (ref 39.0–52.0)
Hemoglobin: 12.7 g/dL — ABNORMAL LOW (ref 13.0–17.0)
MCH: 29.8 pg (ref 26.0–34.0)
MCHC: 33.3 g/dL (ref 30.0–36.0)
MCV: 89.4 fL (ref 78.0–100.0)
Platelets: 174 10*3/uL (ref 150–400)
RBC: 4.26 MIL/uL (ref 4.22–5.81)
RDW: 13.1 % (ref 11.5–15.5)
WBC: 5.6 10*3/uL (ref 4.0–10.5)

## 2015-01-03 LAB — TSH: TSH: 2.353 u[IU]/mL (ref 0.350–4.500)

## 2015-01-03 LAB — TROPONIN I: Troponin I: <0.03 ng/mL (ref <0.031)

## 2015-01-03 MED ORDER — CLOBETASOL PROPIONATE 0.05 % EX OINT
1.0000 "application " | TOPICAL_OINTMENT | Freq: Every day | CUTANEOUS | Status: DC
  Filled 2015-01-03: qty 15

## 2015-01-03 MED ORDER — OXYCODONE-ACETAMINOPHEN 5-325 MG PO TABS
1.0000 | ORAL_TABLET | Freq: Four times a day (QID) | ORAL | Status: DC
  Administered 2015-01-03 – 2015-01-07 (×13): 1 via ORAL
  Filled 2015-01-03 (×13): qty 1

## 2015-01-03 MED ORDER — HEPARIN BOLUS VIA INFUSION
3500.0000 [IU] | Freq: Once | INTRAVENOUS | Status: AC
  Administered 2015-01-03: 3500 [IU] via INTRAVENOUS
  Filled 2015-01-03: qty 3500

## 2015-01-03 MED ORDER — PANTOPRAZOLE SODIUM 40 MG PO TBEC
40.0000 mg | DELAYED_RELEASE_TABLET | Freq: Every day | ORAL | Status: DC
  Administered 2015-01-03 – 2015-01-07 (×5): 40 mg via ORAL
  Filled 2015-01-03 (×5): qty 1

## 2015-01-03 MED ORDER — NITROGLYCERIN 0.4 MG SL SUBL: 0.4000 mg | SUBLINGUAL_TABLET | SUBLINGUAL | Status: DC | PRN

## 2015-01-03 MED ORDER — SENNOSIDES-DOCUSATE SODIUM 8.6-50 MG PO TABS
2.0000 | ORAL_TABLET | Freq: Every day | ORAL | Status: DC
  Administered 2015-01-03 – 2015-01-07 (×4): 2 via ORAL
  Filled 2015-01-03 (×5): qty 2

## 2015-01-03 MED ORDER — HEPARIN (PORCINE) IN NACL 100-0.45 UNIT/ML-% IJ SOLN
750.0000 [IU]/h | INTRAMUSCULAR | Status: DC
  Administered 2015-01-03 – 2015-01-06 (×3): 750 [IU]/h via INTRAVENOUS
  Filled 2015-01-03 (×3): qty 250

## 2015-01-03 MED ORDER — LEUPROLIDE ACETATE (3 MONTH) 22.5 MG IM KIT: 22.5000 mg | PACK | INTRAMUSCULAR | Status: DC

## 2015-01-03 MED ORDER — OXYCODONE HCL 5 MG PO TABS
5.0000 mg | ORAL_TABLET | Freq: Every day | ORAL | Status: DC
  Administered 2015-01-03 – 2015-01-07 (×5): 5 mg via ORAL
  Filled 2015-01-03 (×5): qty 1

## 2015-01-03 MED ORDER — ASPIRIN 81 MG PO CHEW: 324.0000 mg | CHEWABLE_TABLET | ORAL | Status: AC

## 2015-01-03 MED ORDER — ASPIRIN 81 MG PO CHEW
324.0000 mg | CHEWABLE_TABLET | Freq: Once | ORAL | Status: AC
  Administered 2015-01-03: 324 mg via ORAL
  Filled 2015-01-03: qty 4

## 2015-01-03 MED ORDER — ASPIRIN EC 81 MG PO TBEC
81.0000 mg | DELAYED_RELEASE_TABLET | Freq: Every day | ORAL | Status: DC
  Administered 2015-01-04 – 2015-01-07 (×4): 81 mg via ORAL
  Filled 2015-01-03 (×4): qty 1

## 2015-01-03 MED ORDER — ONDANSETRON HCL 4 MG/2ML IJ SOLN: 4.0000 mg | Freq: Four times a day (QID) | INTRAMUSCULAR | Status: DC | PRN

## 2015-01-03 MED ORDER — VITAMIN B-12 1000 MCG PO TABS
1000.0000 ug | ORAL_TABLET | Freq: Every day | ORAL | Status: DC
  Administered 2015-01-03 – 2015-01-07 (×5): 1000 ug via ORAL
  Filled 2015-01-03 (×5): qty 1

## 2015-01-03 MED ORDER — SIMVASTATIN 20 MG PO TABS
40.0000 mg | ORAL_TABLET | Freq: Every day | ORAL | Status: DC
  Administered 2015-01-03 – 2015-01-06 (×4): 40 mg via ORAL
  Filled 2015-01-03: qty 1
  Filled 2015-01-03: qty 2
  Filled 2015-01-03 (×2): qty 1

## 2015-01-03 MED ORDER — POLYETHYLENE GLYCOL 3350 17 G PO PACK
17.0000 g | PACK | Freq: Every day | ORAL | Status: DC
  Administered 2015-01-03 – 2015-01-04 (×2): 17 g via ORAL
  Filled 2015-01-03 (×4): qty 1

## 2015-01-03 MED ORDER — OXYCODONE-ACETAMINOPHEN 10-325 MG PO TABS: 1.0000 | ORAL_TABLET | Freq: Every day | ORAL | Status: DC

## 2015-01-03 MED ORDER — CLOPIDOGREL BISULFATE 75 MG PO TABS
75.0000 mg | ORAL_TABLET | Freq: Every day | ORAL | Status: DC
  Administered 2015-01-03 – 2015-01-06 (×4): 75 mg via ORAL
  Filled 2015-01-03 (×4): qty 1

## 2015-01-03 MED ORDER — FENTANYL 25 MCG/HR TD PT72
25.0000 ug | MEDICATED_PATCH | TRANSDERMAL | Status: DC
  Administered 2015-01-03 – 2015-01-06 (×2): 25 ug via TRANSDERMAL
  Filled 2015-01-03 (×2): qty 1

## 2015-01-03 MED ORDER — CLONAZEPAM 0.5 MG PO TABS
1.0000 mg | ORAL_TABLET | Freq: Every day | ORAL | Status: DC
  Administered 2015-01-03 – 2015-01-06 (×4): 1 mg via ORAL
  Filled 2015-01-03 (×2): qty 1
  Filled 2015-01-03: qty 2
  Filled 2015-01-03: qty 1

## 2015-01-03 MED ORDER — ATENOLOL 12.5 MG HALF TABLET
12.5000 mg | ORAL_TABLET | Freq: Every day | ORAL | Status: DC
  Administered 2015-01-03 – 2015-01-07 (×5): 12.5 mg via ORAL
  Filled 2015-01-03 (×5): qty 1

## 2015-01-03 MED ORDER — DENOSUMAB 120 MG/1.7ML ~~LOC~~ SOLN: 120.0000 mg | SUBCUTANEOUS | Status: DC

## 2015-01-03 MED ORDER — ACETAMINOPHEN 325 MG PO TABS: 650.0000 mg | ORAL_TABLET | ORAL | Status: DC | PRN

## 2015-01-03 MED ORDER — ASPIRIN 300 MG RE SUPP: 300.0000 mg | RECTAL | Status: AC

## 2015-01-03 MED ORDER — VENLAFAXINE HCL ER 150 MG PO CP24
150.0000 mg | ORAL_CAPSULE | Freq: Every day | ORAL | Status: DC
  Administered 2015-01-03 – 2015-01-07 (×5): 150 mg via ORAL
  Filled 2015-01-03 (×5): qty 1

## 2015-01-03 NOTE — ED Notes (Signed)
Pt belongings given to pt brother in law.

## 2015-01-03 NOTE — H&P (Signed)
Physician History and Physical     Patient ID: Russell Browning MRN: [id number] DOB/AGE: [birth year] 74 y.o. Admit date: 01/03/2015  Primary Care Physician: Gar Ponto, MD Primary Cardiologist: Domenic Polite  Active Problems:   * No active hospital problems. *   HPI:   74 y.o. history of CAD.  Multiple RCA stents 2006.  Last seen by [facility] June doing ok.  Last myovue non ischemic 05/2014.  EF normal.  Has metastatic prostate CA getting lupron  Over last 2 weeks has been getting Crescendo SSCP radiating to left arm.  Relieved by nitro on multiple occasions.  Lasting minutes With exertion at times.  Compliant with meds.  Has SSS and pacer followed by Dr Rayann Heman   Review of systems complete and found to be negative unless listed above   Past Medical History  Diagnosis Date  . History of TIAs   . Mixed hyperlipidemia   . Sinus node dysfunction     Medtronic pacemaker - Dr. Rayann Heman  . CAD (coronary artery disease), native coronary artery     DES to proximal, mid, and distal RCA 2006  . 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 Prostate cancer metastatic to bone     Family History  Problem Relation Age of Onset  . Heart disease Sister   . Heart disease Father     History   Social History  . Marital Status: Married    Spouse Name: N/A  . Number of Children: N/A  . Years of Education: N/A   Occupational History  . Not on file.   Social History Main Topics  . Smoking status: Former Smoker -- 1.00 packs/day for 5 years    Types: Cigarettes    Start date: 09/15/1951    Quit date: 06/14/1965  . Smokeless tobacco: Former Systems developer    Types: [place] date: 06/14/1978     Comment: chewed tobacco 5 years 1PPD  . Alcohol Use: No  . Drug Use: No  . Sexual Activity: Not on file   Other Topics Concern  . Not on file   Social History Narrative    Past Surgical History    Procedure Laterality Date  . Pacemaker insertion      MDT      (Not in a hospital admission)  Physical Exam: Blood pressure 132/69, pulse 50, temperature 98.1 F (36.7 C), temperature source Oral, resp. rate 13, SpO2 97 %.   Affect appropriate Healthy:  appears stated age 74: normal Neck supple with no adenopathy JVP normal no bruits no thyromegaly Lungs clear with no wheezing and good diaphragmatic motion Heart:  S1/S2 no murmur, no rub, gallop or click PMI normal Abdomen: benighn, BS positve, no tenderness, no AAA S/P Appy  no bruit.  No HSM or HJR Distal pulses intact with no bruits No edema Neuro non-focal Skin warm and dry No muscular weakness  No current facility-administered medications on file prior to encounter.   Current Outpatient Prescriptions on File Prior to Encounter  Medication Sig Dispense Refill  . atenolol (TENORMIN) 50 MG tablet Take 12.5 mg by mouth daily. Take 1/4 tablet every day    . Calcium Carbonate-Vitamin D (CALCIUM-VITAMIN D) 500-200 MG-UNIT per tablet Take 1 tablet by mouth daily.    . clobetasol ointment (TEMOVATE) 9.48 % Apply 1 application topically daily.    . clonazePAM (KLONOPIN) 1  MG tablet Take 1 mg by mouth at bedtime.     . clopidogrel (PLAVIX) 75 MG tablet TAKE 1 TABLET BY MOUTH DAILY 30 tablet 1  . denosumab (XGEVA) 120 MG/1.7ML SOLN injection Inject 120 mg into the skin every 6 (six) weeks.    . fentaNYL (DURAGESIC - DOSED MCG/HR) 25 MCG/HR Place 1 patch onto the skin every 3 (three) days.    Marland Kitchen leuprolide (LUPRON) 22.5 MG injection Inject 22.5 mg into the muscle every 3 (three) months.    . nitroGLYCERIN (NITROSTAT) 0.4 MG SL tablet Place 1 tablet (0.4 mg total) under the tongue every 5 (five) minutes as needed for chest pain. 25 tablet 3  . oxyCODONE-acetaminophen (PERCOCET) 10-325 MG per tablet Take 1 tablet by mouth daily.     . pantoprazole (PROTONIX) 40 MG tablet Take 40 mg by mouth daily.    . polyethylene glycol  (MIRALAX / GLYCOLAX) packet Take 17 g by mouth daily.    Marland Kitchen senna-docusate (EQ SENNA-S) 8.6-50 MG per tablet Take 2 tablets by mouth daily. 60 tablet 3  . simvastatin (ZOCOR) 40 MG tablet Take 40 mg by mouth at bedtime.      Marland Kitchen venlafaxine XR (EFFEXOR-XR) 150 MG 24 hr capsule Take 150 mg by mouth daily.    . vitamin B-12 (CYANOCOBALAMIN) 1000 MCG tablet Take 1,000 mcg by mouth daily.        Labs:   Lab Results  Component Value Date   WBC 5.6 01/03/2015   HGB 12.7* 01/03/2015   HCT 38.1* 01/03/2015   MCV 89.4 01/03/2015   PLT 174 01/03/2015    Recent Labs Lab 01/03/15 1340  NA 136  K 4.0  CL 101  CO2 28  BUN 13  CREATININE 0.90  CALCIUM 9.2  GLUCOSE 100*   Lab Results  Component Value Date   TROPONINI <0.03 01/03/2015     Lab Results  Component Value Date   CHOL  04/16/2010    126 (NOTE) ATP III Classification:      < 200        mg/dL        Desirable     200 - 239     mg/dL        Borderline High     >= 240        mg/dL        High    Lab Results  Component Value Date   HDL 31* 04/16/2010   Lab Results  Component Value Date   LDLCALC  04/16/2010    67 (NOTE)  Total Cholesterol/HDL Ratio:CHD Risk                       Coronary Heart Disease Risk Table                                       Men       Women         1/2 Average Risk              3.4        3.3             Average Risk              5.0         4.4         2 X Average  Risk              9.6        7.1         3 X Average Risk             23.4       11.0 Use the calculated Patient Ratio above and the CHD Risk table  to determine the patient's CHD Risk. ATP III Classification (LDL):      < 100         mg/dL         Optimal     100 - 129     mg/dL         Near or Above Optimal     130 - 159     mg/dL         Borderline High     160 - 189     mg/dL         High      > 190        mg/dL         Very High    Lab Results  Component Value Date   TRIG 141 04/16/2010   Lab Results  Component Value Date   CHOLHDL  4.1 04/16/2010   No results found for: LDLDIRECT     Radiology: Dg Chest 2 View  01/03/2015   CLINICAL DATA:  Left-sided chest pain.  EXAM: CHEST  2 VIEW  COMPARISON:  None available currently.  FINDINGS: The heart size and mediastinal contours are within normal limits. Hyperexpansion of the lungs is noted consistent with chronic obstructive pulmonary disease. No acute pulmonary disease is noted. No pneumothorax or pleural effusion is noted. Mild biapical scarring is noted. Left-sided pacemaker is noted. The visualized skeletal structures are unremarkable.  IMPRESSION: Findings consistent with chronic obstructive pulmonary disease. No acute cardiopulmonary abnormality seen.   Electronically Signed   By: Marijo Conception, M.D.   On: 01/03/2015 14:07    EKG: SR rate 64 LVH normal ST segments   ASSESSMENT AND PLAN:   CAD:  Distant stents to RCA  Worrisome chest pain pattern with nitro relief.  Makes no sense to repeat myovue as it was presumed normal in December.  Discussed with patient favor transfer to [facility] , heparin Plan diagnostic cath on Monday unless he develops positive enzymes or ECG changes  Continue beta blocker asa and plavix  Check P2Y make sure he is a plavix responder    Pacer:  F/U Allred normal function in past with RA lead failure and change out in 2008  Chol:  On statin   Prostate Cancer:  Continue lupron  Indicates bone mets to right leg  Takes percocet and durgesic patch  Patient seen in [facility]  Notified cath lab for Monday but orders for admission and cath will need to be done after transfer to [facility]  Signed: Collier Salina Nishan7/22/2016, 3:36 PM

## 2015-01-03 NOTE — ED Provider Notes (Signed)
CSN: [id number]     Arrival date & time 01/03/15  1333 History   First MD Initiated Contact with Patient 01/03/15 1338     Chief Complaint  Patient presents with  . Chest Pain     (Consider location/radiation/quality/duration/timing/severity/associated sxs/prior Treatment) HPI Comments: 74 year old male with past medical history including metastatic prostate cancer, CAD status post stenting, hyperlipidemia, pacemaker who presents with chest pain. The patient states that he has had intermittent episodes of chest pain for the past one month. They typically occur after exertion and improved with rest. Several days ago, he was walking to his car and after he arrived he began having central chest pain that radiated down his left arm. He took a nitroglycerin which resolved the pain. Last night, he began having chest pain at rest that began gradually and it kept him up most of the night. The chest pain resolved around 4 AM after which she was able to go to sleep. He had a brief episode of chest pain this morning but is currently chest pain-free. During his chest pain episodes, he endorses associated shortness of breath and diaphoresis but no nausea. He denies any sudden onset of chest pain or any ripping or tearing pain. No fevers, cough/cold symptoms, or recent illness.  Patient is a 74 y.o. male presenting with chest pain. The history is provided by the patient.  Chest Pain   Past Medical History  Diagnosis Date  . History of TIAs   . Mixed hyperlipidemia   . Sinus node dysfunction     [employer] pacemaker - Dr. Rayann Heman  . CAD (coronary artery disease), native coronary artery     DES to proximal, mid, and distal RCA 2006  . 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 Prostate cancer metastatic to bone    Past Surgical History  Procedure Laterality Date  . Pacemaker insertion     MDT   Family History  Problem Relation Age of Onset  . Heart disease Sister   . Heart disease Father    History  Substance Use Topics  . Smoking status: Former Smoker -- 1.00 packs/day for 5 years    Types: Cigarettes    Start date: 09/15/1951    Quit date: 06/14/1965  . Smokeless tobacco: Former Systems developer    Types: Custar date: 06/14/1978     Comment: chewed tobacco 5 years 1PPD  . Alcohol Use: No    Review of Systems  Cardiovascular: Positive for chest pain.   10 Systems reviewed and are negative for acute change except as noted in the HPI.    Allergies  Penicillins; Phenazopyridine hcl; and Ramipril  Home Medications   Prior to Admission medications   Medication Sig Start Date End Date Taking? Authorizing Provider  atenolol (TENORMIN) 50 MG tablet Take 12.5 mg by mouth daily. Take 1/4 tablet every day   Yes Historical Provider, MD  Calcium Carbonate-Vitamin D (CALCIUM-VITAMIN D) 500-200 MG-UNIT per tablet Take 1 tablet by mouth daily.   Yes Historical Provider, MD  clobetasol ointment (TEMOVATE) 9.47 % Apply 1 application topically daily.   Yes Historical Provider, MD  clonazePAM (KLONOPIN) 1 MG tablet Take 1 mg by mouth at bedtime.    Yes Historical Provider, MD  clopidogrel (PLAVIX) 75 MG tablet TAKE 1 TABLET BY MOUTH DAILY 04/19/11  Yes Thompson Grayer, MD  denosumab (XGEVA) 120  MG/1.7ML SOLN injection Inject 120 mg into the skin every 6 (six) weeks.   Yes Historical Provider, MD  fentaNYL (DURAGESIC - DOSED MCG/HR) 25 MCG/HR Place 1 patch onto the skin every 3 (three) days. 01/24/12  Yes Historical Provider, MD  leuprolide (LUPRON) 22.5 MG injection Inject 22.5 mg into the muscle every 3 (three) months.   Yes Historical Provider, MD  nitroGLYCERIN (NITROSTAT) 0.4 MG SL tablet Place 1 tablet (0.4 mg total) under the tongue every 5 (five) minutes as needed for chest pain. 11/30/11  Yes Ezra Sites, MD  oxyCODONE-acetaminophen (PERCOCET) 10-325 MG per tablet Take 1 tablet  by mouth daily.  01/24/12  Yes Historical Provider, MD  pantoprazole (PROTONIX) 40 MG tablet Take 40 mg by mouth daily.   Yes Historical Provider, MD  polyethylene glycol (MIRALAX / GLYCOLAX) packet Take 17 g by mouth daily.   Yes Historical Provider, MD  senna-docusate (EQ SENNA-S) 8.6-50 MG per tablet Take 2 tablets by mouth daily. 09/29/12  Yes Wyatt Portela, MD  simvastatin (ZOCOR) 40 MG tablet Take 40 mg by mouth at bedtime.     Yes Historical Provider, MD  venlafaxine XR (EFFEXOR-XR) 150 MG 24 hr capsule Take 150 mg by mouth daily. 01/24/12  Yes Historical Provider, MD  vitamin B-12 (CYANOCOBALAMIN) 1000 MCG tablet Take 1,000 mcg by mouth daily.     Yes Historical Provider, MD   BP 131/74 mmHg  Pulse 52  Temp(Src) 98.1 F (36.7 C) (Oral)  Resp 14  SpO2 99% Physical Exam  Constitutional: He is oriented to person, place, and time. He appears well-developed. No distress.  thin  HENT:  Head: Normocephalic and atraumatic.  Moist mucous membranes  Eyes: Conjunctivae are normal. Pupils are equal, round, and reactive to light.  Neck: Neck supple.  Cardiovascular: Normal rate, regular rhythm and normal heart sounds.   No murmur heard. Pacemaker without erythema or tenderness  Pulmonary/Chest: Effort normal and breath sounds normal.  Abdominal: Soft. Bowel sounds are normal. He exhibits no distension. There is no tenderness.  Musculoskeletal: He exhibits no edema.  Neurological: He is alert and oriented to person, place, and time.  Fluent speech  Skin: Skin is warm and dry.  Psychiatric: He has a normal mood and affect. Judgment normal.  Nursing note and vitals reviewed.   ED Course  Procedures (including critical care time) Labs Review Labs Reviewed  CBC - Abnormal; Notable for the following:    Hemoglobin 12.7 (*)    HCT 38.1 (*)    All other components within normal limits  BASIC METABOLIC PANEL - Abnormal; Notable for the following:    Glucose, Bld 100 (*)    All other  components within normal limits  TROPONIN I    Imaging Review Dg Chest 2 View  01/03/2015   CLINICAL DATA:  Left-sided chest pain.  EXAM: CHEST  2 VIEW  COMPARISON:  None available currently.  FINDINGS: The heart size and mediastinal contours are within normal limits. Hyperexpansion of the lungs is noted consistent with chronic obstructive pulmonary disease. No acute pulmonary disease is noted. No pneumothorax or pleural effusion is noted. Mild biapical scarring is noted. Left-sided pacemaker is noted. The visualized skeletal structures are unremarkable.  IMPRESSION: Findings consistent with chronic obstructive pulmonary disease. No acute cardiopulmonary abnormality seen.   Electronically Signed   By: Marijo Conception, M.D.   On: 01/03/2015 14:07     EKG Interpretation   Date/Time:  Friday January 03 2015 13:41:18 EDT Ventricular  Rate:  64 PR Interval:  153 QRS Duration: 83 QT Interval:  419 QTC Calculation: 432 R Axis:   58 Text Interpretation:  Sinus rhythm Abnormal R-wave progression, early  transition Left ventricular hypertrophy No significant change since last  tracing Confirmed by Keylin Ferryman MD, Nieve Rojero ([id number]) on 01/03/2015 3:28:54 PM      MDM   Final diagnoses:  Anginal chest pain at rest  Coronary artery disease involving native coronary artery of native heart with unstable angina pectoris    74 year old male with extensive past medical history above including CAD who presents with intermittent episodes of chest pain that were initially with exertion but now have occurred at rest. Patient chest pain-free and comfortable on arrival with stable vital signs. EKG on arrival was unchanged from previous. Obtained labs listed above which showed negative troponin and unremarkable CBC and BMP. Chest x-ray negative for acute process. Given the patient's metastatic cancer, I have considered PE but feel that it is less likely because of the patient's description of chest pain as exertional and  responsive to nitroglycerin. Furthermore, the patient has normal O2 sat and no tachycardia in the ED. The patient will require further cardiology evaluation for his unstable angina. Pt stable in ED with no significant deterioration in condition.The patient is stable for transfer and will be taken to [facility] for cardiology admission.     Sharlett Iles, MD 01/03/15 [phone]

## 2015-01-03 NOTE — Progress Notes (Signed)
ANTICOAGULATION CONSULT NOTE - Initial Consult  Pharmacy Consult for heparin  Indication: chest pain/ACS  Allergies  Allergen Reactions  . Penicillins Other (See Comments)    Passed out  . Phenazopyridine Hcl Hives  . Ramipril Hives    Patient Measurements: Height: 5\' 10"  (177.8 cm) Weight: 135 lb 4.8 oz (61.372 kg) IBW/kg (Calculated) : 73 Heparin Dosing Weight: 61.4 kg  Vital Signs: Temp: 98.7 F (37.1 C) (07/22 1855) Temp Source: Oral (07/22 1855) BP: 142/97 mmHg (07/22 1855) Pulse Rate: 78 (07/22 1855)  Labs:  Recent Labs  01/03/15 1340  HGB 12.7*  HCT 38.1*  PLT 174  CREATININE 0.90  TROPONINI <0.03    Estimated Creatinine Clearance: 63.5 mL/min (by C-G formula based on Cr of 0.9).   Medical History: Past Medical History  Diagnosis Date  . History of TIAs   . Mixed hyperlipidemia   . Sinus node dysfunction     [employer] pacemaker - Dr. Rayann Heman  . CAD (coronary artery disease), native coronary artery     DES to proximal, mid, and distal RCA 2006  . 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 Prostate cancer metastatic to bone     Medications:  Prescriptions prior to admission  Medication Sig Dispense Refill Last Dose  . atenolol (TENORMIN) 50 MG tablet Take 12.5 mg by mouth daily. Take 1/4 tablet every day   01/02/2015 at 0900  . Calcium Carbonate-Vitamin D (CALCIUM-VITAMIN D) 500-200 MG-UNIT per tablet Take 1 tablet by mouth daily.   01/02/2015 at Unknown time  . clobetasol ointment (TEMOVATE) 8.31 % Apply 1 application topically daily.   01/02/2015 at Unknown time  . clonazePAM (KLONOPIN) 1 MG tablet Take 1 mg by mouth at bedtime.    01/01/2015  . clopidogrel (PLAVIX) 75 MG tablet TAKE 1 TABLET BY MOUTH DAILY 30 tablet 1 01/02/2015 at 0900  . denosumab (XGEVA) 120 MG/1.7ML SOLN injection Inject 120 mg into the skin every 6 (six) weeks.    12/25/2014  . fentaNYL (DURAGESIC - DOSED MCG/HR) 25 MCG/HR Place 1 patch onto the skin every 3 (three) days.   12/31/2014  . leuprolide (LUPRON) 22.5 MG injection Inject 22.5 mg into the muscle every 3 (three) months.   01/01/2015  . nitroGLYCERIN (NITROSTAT) 0.4 MG SL tablet Place 1 tablet (0.4 mg total) under the tongue every 5 (five) minutes as needed for chest pain. 25 tablet 3 01/01/2015  . oxyCODONE-acetaminophen (PERCOCET) 10-325 MG per tablet Take 1 tablet by mouth daily.    01/02/2015 at Unknown time  . pantoprazole (PROTONIX) 40 MG tablet Take 40 mg by mouth daily.   01/02/2015 at Unknown time  . polyethylene glycol (MIRALAX / GLYCOLAX) packet Take 17 g by mouth daily.   01/02/2015 at Unknown time  . senna-docusate (EQ SENNA-S) 8.6-50 MG per tablet Take 2 tablets by mouth daily. 60 tablet 3 01/02/2015 at Unknown time  . simvastatin (ZOCOR) 40 MG tablet Take 40 mg by mouth at bedtime.     01/02/2015 at Unknown time  . venlafaxine XR (EFFEXOR-XR) 150 MG 24 hr capsule Take 150 mg by mouth daily.   01/02/2015 at Unknown time  . vitamin B-12 (CYANOCOBALAMIN) 1000 MCG tablet Take 1,000 mcg by mouth daily.     01/02/2015 at Unknown time    Assessment: 74 yo male admitted with chest pain and ST depression. Initiating heparin gtt for  ACS. Hx of CAD s/p PCI in 2006. CBC stable, no anticoagulants pta.   Goal of Therapy:  Heparin level 0.3-0.7 units/ml Monitor platelets by anticoagulation protocol: Yes   Plan:  Heparin bolus 3500 units x1 then 750 units/hr Daily HL, CBC Monitor s/sx bleeding First level tomorrow morning  F/u cardiology plans, possible cath on Monday    Hughes Better, PharmD, BCPS Clinical Pharmacist Pager: [phone] 01/03/2015 8:17 PM

## 2015-01-03 NOTE — ED Notes (Signed)
Pt states intermittent CP x ~ 3 weeks. Pt states he was seen by Dr. Domenic Polite in [place] and was told that it was most likely a pulled muscle. Pt states last episode of CP was at 0400 this morning. Denies CP at this times. Pt has pacemaker.

## 2015-01-03 NOTE — Progress Notes (Signed)
     Pt had CP while being transferred to [place] with ST depression  Also associated with a fast HR .  He is currently stable .  Will start IV heparin, IV NTG. Plan is for cath on Monday     NahserWonda Cheng, MD  01/03/2015 7:28 PM    [place],  [place], [place]  [address] Pager [phone] Phone: [phone]; Fax: [phone]   [facility]  [address] [place], [place]  [address] [phone]   Fax [phone]

## 2015-01-03 NOTE — ED Notes (Signed)
Report given to [facility] 

## 2015-01-04 LAB — BASIC METABOLIC PANEL
Anion gap: 8 (ref 5–15)
BUN: 9 mg/dL (ref 6–20)
CO2: 29 mmol/L (ref 22–32)
Calcium: 9 mg/dL (ref 8.9–10.3)
Chloride: 100 mmol/L — ABNORMAL LOW (ref 101–111)
Creatinine, Ser: 1.02 mg/dL (ref 0.61–1.24)
GFR calc non Af Amer: >60 mL/min (ref >60)
Glucose, Bld: 94 mg/dL (ref 65–99)
Potassium: 4 mmol/L (ref 3.5–5.1)
Sodium: 137 mmol/L (ref 135–145)

## 2015-01-04 LAB — CBC
HCT: 34.9 % — ABNORMAL LOW (ref 39.0–52.0)
Hemoglobin: 11.7 g/dL — ABNORMAL LOW (ref 13.0–17.0)
MCH: 29.5 pg (ref 26.0–34.0)
MCHC: 33.5 g/dL (ref 30.0–36.0)
MCV: 88.1 fL (ref 78.0–100.0)
Platelets: 161 10*3/uL (ref 150–400)
RBC: 3.96 MIL/uL — AB (ref 4.22–5.81)
RDW: 13.1 % (ref 11.5–15.5)
WBC: 5.9 10*3/uL (ref 4.0–10.5)

## 2015-01-04 LAB — LIPID PANEL
CHOLESTEROL: 171 mg/dL (ref 0–200)
HDL: 42 mg/dL (ref >40)
LDL Cholesterol: 108 mg/dL — ABNORMAL HIGH (ref 0–99)
TRIGLYCERIDES: 106 mg/dL (ref <150)
Total CHOL/HDL Ratio: 4.1 RATIO
VLDL: 21 mg/dL (ref 0–40)

## 2015-01-04 LAB — HEPARIN LEVEL (UNFRACTIONATED)
HEPARIN UNFRACTIONATED: 0.44 [IU]/mL (ref 0.30–0.70)
HEPARIN UNFRACTIONATED: 0.29 [IU]/mL — AB (ref 0.30–0.70)
Heparin Unfractionated: <0.1 IU/mL — ABNORMAL LOW (ref 0.30–0.70)

## 2015-01-04 MED ORDER — HEPARIN BOLUS VIA INFUSION
2000.0000 [IU] | Freq: Once | INTRAVENOUS | Status: DC
  Filled 2015-01-04: qty 2000

## 2015-01-04 NOTE — Progress Notes (Signed)
ANTICOAGULATION CONSULT NOTE - Follow Up Consult  Pharmacy Consult for Heparin  Indication: chest pain/ACS  Allergies  Allergen Reactions  . Penicillins Other (See Comments)    Passed out  . Phenazopyridine Hcl Hives  . Ramipril Hives    Patient Measurements: Height: 5\' 10"  (177.8 cm) Weight: 135 lb 9.3 oz (61.5 kg) IBW/kg (Calculated) : 73  Vital Signs: Temp: 98.2 F (36.8 C) (07/23 0608) Temp Source: Oral (07/23 1962) BP: 114/65 mmHg (07/23 0608) Pulse Rate: 61 (07/23 0608)  Labs:  Recent Labs  01/03/15 1340 01/04/15 0431  HGB 12.7* 11.7*  HCT 38.1* 34.9*  PLT 174 161  HEPARINUNFRC  --  0.44  CREATININE 0.90 1.02  TROPONINI <0.03  --     Estimated Creatinine Clearance: 56.1 mL/min (by C-G formula based on Cr of 1.02).  Assessment: Initial heparin level is therapeutic, plans for cath Monday  Goal of Therapy:  Heparin level 0.3-0.7 units/ml Monitor platelets by anticoagulation protocol: Yes   Plan:  -Continue heparin at 750 units/hr -1200 HL -Daily CBC/HL -Monitor for bleeding  Narda Bonds 01/04/2015,6:54 AM

## 2015-01-04 NOTE — Progress Notes (Addendum)
ANTICOAGULATION CONSULT NOTE - Initial Consult  Pharmacy Consult for heparin  Indication: chest pain/ACS  Allergies  Allergen Reactions  . Penicillins Other (See Comments)    Passed out  . Phenazopyridine Hcl Hives  . Ramipril Hives    Patient Measurements: Height: 5\' 10"  (177.8 cm) Weight: 135 lb 9.3 oz (61.5 kg) IBW/kg (Calculated) : 73 Heparin Dosing Weight: 61.4 kg  Vital Signs: Temp: 98.4 F (36.9 C) (07/23 1200) Temp Source: Oral (07/23 1200) BP: 126/82 mmHg (07/23 1200) Pulse Rate: 54 (07/23 1200)  Labs:  Recent Labs  01/03/15 1340 01/04/15 0431 01/04/15 1140  HGB 12.7* 11.7*  --   HCT 38.1* 34.9*  --   PLT 174 161  --   HEPARINUNFRC  --  0.44 <0.10*  CREATININE 0.90 1.02  --   TROPONINI <0.03  --   --     Estimated Creatinine Clearance: 56.1 mL/min (by C-G formula based on Cr of 1.02).   Assessment: 74 yo male admitted with chest pain and ST depression. Initiating heparin gtt for ACS. Hx of CAD s/p PCI in 2006. No PTA anticoagulants. Slight decrease in H/H and PLT today. HL <0.10 due to interruption of infusion for approx. one hour to change IV site (nurse reported some oozing from line). Bleeding resolved and IV site changed. No further bleeding reported.   Goal of Therapy:  Heparin level 0.3-0.7 units/ml Monitor platelets by anticoagulation protocol: Yes   Plan:  Will resume heparin at 750 units/hr as previous HL was therapeutic prior to interruption. F/u 6 hr HL Monitor CBC, s/sx bleeding   Judieth Keens, PharmD 01/04/2015 3:01 PM   ADDN: Initial HL is therapeutic at 0.29 on heparin 750 units/hr. No issues with infusion or bleeding noted.  Plan: Continue heparin 750 units/hr Check AM HL Daily CBC  Andrey Cota. Diona Foley, PharmD Clinical Pharmacist Pager [phone]

## 2015-01-04 NOTE — Progress Notes (Signed)
Subjective:  No recurrent chest pain overnight.  Feels OK today.  Initial troponin negative.  Objective:  Vital Signs in the last 24 hours: BP 114/65 mmHg  Pulse 61  Temp(Src) 98.2 F (36.8 C) (Oral)  Resp 19  Ht 5\' 10"  (1.778 m)  Wt 61.5 kg (135 lb 9.3 oz)  BMI 19.45 kg/m2  SpO2 100%  Physical Exam: Pleasant WM in NAD Lungs:  Clear Cardiac:  Regular rhythm, normal S1 and S2, no S3 Abdomen:  Soft, nontender, no masses Extremities:  No edema present  Intake/Output from previous day: 07/22 0701 - 07/23 0700 In: 240 [P.O.:240] Out: 400 [Urine:400]  Weight Filed Weights   01/03/15 1855 01/04/15 0608  Weight: 61.372 kg (135 lb 4.8 oz) 61.5 kg (135 lb 9.3 oz)    Lab Results: Basic Metabolic Panel:  Recent Labs  01/03/15 1340 01/04/15 0431  NA 136 137  K 4.0 4.0  CL 101 100*  CO2 28 29  GLUCOSE 100* 94  BUN 13 9  CREATININE 0.90 1.02   CBC:  Recent Labs  01/03/15 1340 01/04/15 0431  WBC 5.6 5.9  HGB 12.7* 11.7*  HCT 38.1* 34.9*  MCV 89.4 88.1  PLT 174 161   Cardiac Panel (last 3 results)  Recent Labs  01/03/15 1340  TROPONINI <0.03    Telemetry: Paced rhythm  Assessment/Plan:  1. Unstable angina 2. CAD with prior stents 3. Functioning pacer 4. Hyperlipdemia  Rec:  On heparin.  Plan cath Mon.    Kerry Hough  MD [facility]  01/04/2015, 8:54 AM

## 2015-01-05 LAB — HEPARIN LEVEL (UNFRACTIONATED): HEPARIN UNFRACTIONATED: 0.33 [IU]/mL (ref 0.30–0.70)

## 2015-01-05 LAB — CBC
HEMATOCRIT: 33.2 % — AB (ref 39.0–52.0)
HEMOGLOBIN: 11 g/dL — AB (ref 13.0–17.0)
MCH: 29.5 pg (ref 26.0–34.0)
MCHC: 33.1 g/dL (ref 30.0–36.0)
MCV: 89 fL (ref 78.0–100.0)
Platelets: 157 10*3/uL (ref 150–400)
RBC: 3.73 MIL/uL — ABNORMAL LOW (ref 4.22–5.81)
RDW: 13.4 % (ref 11.5–15.5)
WBC: 5.4 10*3/uL (ref 4.0–10.5)

## 2015-01-05 MED ORDER — SODIUM CHLORIDE 0.9 % IJ SOLN
3.0000 mL | Freq: Two times a day (BID) | INTRAMUSCULAR | Status: DC
  Administered 2015-01-05 – 2015-01-06 (×2): 3 mL via INTRAVENOUS

## 2015-01-05 MED ORDER — SODIUM CHLORIDE 0.9 % IV SOLN: 250.0000 mL | INTRAVENOUS | Status: DC | PRN

## 2015-01-05 MED ORDER — ASPIRIN 81 MG PO CHEW
81.0000 mg | CHEWABLE_TABLET | ORAL | Status: AC
  Administered 2015-01-06: 81 mg via ORAL
  Filled 2015-01-05: qty 1

## 2015-01-05 MED ORDER — SODIUM CHLORIDE 0.9 % IJ SOLN: 3.0000 mL | INTRAMUSCULAR | Status: DC | PRN

## 2015-01-05 MED ORDER — SODIUM CHLORIDE 0.9 % WEIGHT BASED INFUSION: 1.0000 mL/kg/h | INTRAVENOUS | Status: DC

## 2015-01-05 MED ORDER — SODIUM CHLORIDE 0.9 % WEIGHT BASED INFUSION
3.0000 mL/kg/h | INTRAVENOUS | Status: DC
  Administered 2015-01-06: 3 mL/kg/h via INTRAVENOUS

## 2015-01-05 NOTE — Progress Notes (Signed)
Subjective:  No recurrent chest pain overnight.  Feels fine today.  No shortness of breath.  Questions about catheterization entertained.    Objective:  Vital Signs in the last 24 hours: BP 108/66 mmHg  Pulse 61  Temp(Src) 97.8 F (36.6 C) (Oral)  Resp 18  Ht 5\' 10"  (1.778 m)  Wt 60.691 kg (133 lb 12.8 oz)  BMI 19.20 kg/m2  SpO2 100%  Physical Exam: Pleasant WM in NAD Lungs:  Clear Cardiac:  Regular rhythm, normal S1 and S2, no S3 Extremities:  No edema present  Intake/Output from previous day: 07/23 0701 - 07/24 0700 In: 608.6 [P.O.:360; I.V.:248.6] Out: 800 [Urine:800]  Weight Filed Weights   01/03/15 1855 01/04/15 0608 01/05/15 0459  Weight: 61.372 kg (135 lb 4.8 oz) 61.5 kg (135 lb 9.3 oz) 60.691 kg (133 lb 12.8 oz)    Lab Results: Basic Metabolic Panel:  Recent Labs  01/03/15 1340 01/04/15 0431  NA 136 137  K 4.0 4.0  CL 101 100*  CO2 28 29  GLUCOSE 100* 94  BUN 13 9  CREATININE 0.90 1.02   CBC:  Recent Labs  01/04/15 0431 01/05/15 0412  WBC 5.9 5.4  HGB 11.7* 11.0*  HCT 34.9* 33.2*  MCV 88.1 89.0  PLT 161 157   Cardiac Panel (last 3 results)  Recent Labs  01/03/15 1340  TROPONINI <0.03    Telemetry: Paced rhythm  Assessment/Plan:  1. Unstable angina 2. CAD with prior stents 3. Functioning pacer 4. Hyperlipdemia  Rec:  He continues on heparin.  Has had several catheterizations before and risks again were explained.   Kerry Hough  MD [facility]  01/05/2015, 9:17 AM

## 2015-01-05 NOTE — Progress Notes (Signed)
ANTICOAGULATION CONSULT NOTE - Initial Consult  Pharmacy Consult for heparin  Indication: chest pain/ACS  Allergies  Allergen Reactions  . Penicillins Other (See Comments)    Passed out  . Phenazopyridine Hcl Hives  . Ramipril Hives    Patient Measurements: Height: 5\' 10"  (177.8 cm) Weight: 133 lb 12.8 oz (60.691 kg) IBW/kg (Calculated) : 73 Heparin Dosing Weight: 61.4 kg  Vital Signs: Temp: 97.8 F (36.6 C) (07/24 0459) Temp Source: Oral (07/24 0459) BP: 108/66 mmHg (07/24 0459) Pulse Rate: 61 (07/24 0459)  Labs:  Recent Labs  01/03/15 1340  01/04/15 0431 01/04/15 1140 01/04/15 2042 01/05/15 0412  HGB 12.7*  --  11.7*  --   --  11.0*  HCT 38.1*  --  34.9*  --   --  33.2*  PLT 174  --  161  --   --  157  HEPARINUNFRC  --   < > 0.44 <0.10* 0.29* 0.33  CREATININE 0.90  --  1.02  --   --   --   TROPONINI <0.03  --   --   --   --   --   < > = values in this interval not displayed.  Estimated Creatinine Clearance: 55.4 mL/min (by C-G formula based on Cr of 1.02).   Assessment: 75 yo male admitted with chest pain and ST depression. Initiating heparin gtt for ACS. Hx of CAD s/p PCI in 2006. No PTA anticoagulants. Slight decrease in CBC today. HL <0.10 due to interruption of infusion for approx. one hour to change IV site (nurse reported some oozing from line). Bleeding resolved and IV site changed. Resumed heparin at previous rate af 750 units/hr; last two heparin levels therapeutic (0.29, 0.33). No further bleeding reported.   Goal of Therapy:  Heparin level 0.3-0.7 units/ml Monitor platelets by anticoagulation protocol: Yes   Plan:  Continue heparin at 750 units/hr  Monitor daily heparin level, CBC, and s/sx bleeding  Judieth Keens, PharmD Pharmacy Resident Pager [phone]

## 2015-01-05 NOTE — Progress Notes (Signed)
PT Cancellation Note  Patient Details Name: Russell Browning MRN: [id number] DOB: 01/25/1941   Cancelled Treatment:    Reason Eval/Treat Not Completed: Medical issues which prohibited therapy Pt with unstable angina. Plan for cardiac cath Monday. Will follow up next available time.  Marguarite Arbour A Voris Tigert 01/05/2015, 3:29 PM  [place], [place], DPT [phone]

## 2015-01-06 ENCOUNTER — Encounter (HOSPITAL_COMMUNITY)
Admission: EM | Disposition: A | Discharge status: Home / Self Care | Payer: Self-pay | Source: Home / Self Care | Attending: Cardiovascular Disease

## 2015-01-06 DIAGNOSIS — I208 Other forms of angina pectoris: Secondary | ICD-10-CM | POA: Insufficient documentation

## 2015-01-06 DIAGNOSIS — I200 Unstable angina: Secondary | ICD-10-CM

## 2015-01-06 DIAGNOSIS — I25110 Atherosclerotic heart disease of native coronary artery with unstable angina pectoris: Secondary | ICD-10-CM

## 2015-01-06 HISTORY — PX: CARDIAC CATHETERIZATION: SHX172

## 2015-01-06 LAB — CBC
HCT: 32.8 % — ABNORMAL LOW (ref 39.0–52.0)
Hemoglobin: 11.1 g/dL — ABNORMAL LOW (ref 13.0–17.0)
MCH: 30.2 pg (ref 26.0–34.0)
MCHC: 33.8 g/dL (ref 30.0–36.0)
MCV: 89.4 fL (ref 78.0–100.0)
PLATELETS: 144 10*3/uL — AB (ref 150–400)
RBC: 3.67 MIL/uL — AB (ref 4.22–5.81)
RDW: 13.1 % (ref 11.5–15.5)
WBC: 4.6 10*3/uL (ref 4.0–10.5)

## 2015-01-06 LAB — PROTIME-INR
INR: 1.11 (ref 0.00–1.49)
PROTHROMBIN TIME: 14.5 s (ref 11.6–15.2)

## 2015-01-06 LAB — POCT ACTIVATED CLOTTING TIME: ACTIVATED CLOTTING TIME: 448 s

## 2015-01-06 LAB — HEPARIN LEVEL (UNFRACTIONATED): Heparin Unfractionated: 0.33 IU/mL (ref 0.30–0.70)

## 2015-01-06 SURGERY — LEFT HEART CATH AND CORONARY ANGIOGRAPHY
Anesthesia: LOCAL

## 2015-01-06 MED ORDER — RADIAL COCKTAIL (HEPARIN/VERAPAMIL/LIDOCAINE/NITRO)
Status: DC | PRN
  Administered 2015-01-06: 1 via INTRA_ARTERIAL

## 2015-01-06 MED ORDER — ASPIRIN EC 81 MG PO TBEC: 81.0000 mg | DELAYED_RELEASE_TABLET | Freq: Every day | ORAL | Status: DC

## 2015-01-06 MED ORDER — HEPARIN SODIUM (PORCINE) 1000 UNIT/ML IJ SOLN
INTRAMUSCULAR | Status: AC
  Filled 2015-01-06: qty 1

## 2015-01-06 MED ORDER — SODIUM CHLORIDE 0.9 % IV SOLN
250.0000 mg | INTRAVENOUS | Status: DC | PRN
  Administered 2015-01-06: 1.75 mg/kg/h via INTRAVENOUS

## 2015-01-06 MED ORDER — LIDOCAINE HCL (PF) 1 % IJ SOLN
INTRAMUSCULAR | Status: AC
  Filled 2015-01-06: qty 30

## 2015-01-06 MED ORDER — TICAGRELOR 90 MG PO TABS
90.0000 mg | ORAL_TABLET | Freq: Two times a day (BID) | ORAL | Status: DC
  Administered 2015-01-07: 90 mg via ORAL
  Filled 2015-01-06 (×2): qty 1

## 2015-01-06 MED ORDER — MIDAZOLAM HCL 2 MG/2ML IJ SOLN
INTRAMUSCULAR | Status: DC | PRN
  Administered 2015-01-06: 2 mg via INTRAVENOUS
  Administered 2015-01-06: 1 mg via INTRAVENOUS

## 2015-01-06 MED ORDER — FENTANYL CITRATE (PF) 100 MCG/2ML IJ SOLN
INTRAMUSCULAR | Status: AC
  Filled 2015-01-06: qty 2

## 2015-01-06 MED ORDER — TICAGRELOR 90 MG PO TABS
ORAL_TABLET | ORAL | Status: DC | PRN
  Administered 2015-01-06: 180 mg via ORAL

## 2015-01-06 MED ORDER — ONDANSETRON HCL 4 MG/2ML IJ SOLN: 4.0000 mg | Freq: Four times a day (QID) | INTRAMUSCULAR | Status: DC | PRN

## 2015-01-06 MED ORDER — ANGIOPLASTY BOOK
Freq: Once | Status: AC
  Administered 2015-01-06: 21:00:00
  Filled 2015-01-06: qty 1

## 2015-01-06 MED ORDER — TICAGRELOR 90 MG PO TABS
ORAL_TABLET | ORAL | Status: AC
  Filled 2015-01-06: qty 1

## 2015-01-06 MED ORDER — SODIUM CHLORIDE 0.9 % IV SOLN
0.2500 mg/kg/h | INTRAVENOUS | Status: AC
  Filled 2015-01-06: qty 250

## 2015-01-06 MED ORDER — SODIUM CHLORIDE 0.9 % IV SOLN
INTRAVENOUS | Status: DC
  Administered 2015-01-06: 17:00:00 150 mL/h via INTRAVENOUS

## 2015-01-06 MED ORDER — SODIUM CHLORIDE 0.9 % IJ SOLN: 3.0000 mL | INTRAMUSCULAR | Status: DC | PRN

## 2015-01-06 MED ORDER — SODIUM CHLORIDE 0.9 % IJ SOLN: 3.0000 mL | Freq: Two times a day (BID) | INTRAMUSCULAR | Status: DC

## 2015-01-06 MED ORDER — IOHEXOL 350 MG/ML SOLN
INTRAVENOUS | Status: DC | PRN
  Administered 2015-01-06: 205 mL via INTRACARDIAC

## 2015-01-06 MED ORDER — NITROGLYCERIN 1 MG/10 ML FOR IR/CATH LAB
INTRA_ARTERIAL | Status: AC
  Filled 2015-01-06: qty 10

## 2015-01-06 MED ORDER — BIVALIRUDIN 250 MG IV SOLR
INTRAVENOUS | Status: AC
  Filled 2015-01-06: qty 250

## 2015-01-06 MED ORDER — MIDAZOLAM HCL 2 MG/2ML IJ SOLN
INTRAMUSCULAR | Status: AC
  Filled 2015-01-06: qty 2

## 2015-01-06 MED ORDER — SODIUM CHLORIDE 0.9 % IV SOLN: 250.0000 mL | INTRAVENOUS | Status: DC | PRN

## 2015-01-06 MED ORDER — NITROGLYCERIN 1 MG/10 ML FOR IR/CATH LAB
INTRA_ARTERIAL | Status: DC | PRN
  Administered 2015-01-06: 16:00:00

## 2015-01-06 MED ORDER — VERAPAMIL HCL 2.5 MG/ML IV SOLN
INTRAVENOUS | Status: AC
  Filled 2015-01-06: qty 2

## 2015-01-06 MED ORDER — ACETAMINOPHEN 325 MG PO TABS: 650.0000 mg | ORAL_TABLET | ORAL | Status: DC | PRN

## 2015-01-06 MED ORDER — HEPARIN SODIUM (PORCINE) 1000 UNIT/ML IJ SOLN
INTRAMUSCULAR | Status: DC | PRN
  Administered 2015-01-06: 3000 [IU] via INTRAVENOUS

## 2015-01-06 MED ORDER — FENTANYL CITRATE (PF) 100 MCG/2ML IJ SOLN
INTRAMUSCULAR | Status: DC | PRN
  Administered 2015-01-06: 50 ug via INTRAVENOUS
  Administered 2015-01-06: 25 ug via INTRAVENOUS

## 2015-01-06 MED ORDER — BIVALIRUDIN BOLUS VIA INFUSION - CUPID
INTRAVENOUS | Status: DC | PRN
  Administered 2015-01-06: 45.825 mg via INTRAVENOUS

## 2015-01-06 MED ORDER — NITROGLYCERIN 1 MG/10 ML FOR IR/CATH LAB
INTRA_ARTERIAL | Status: DC | PRN
  Administered 2015-01-06 (×2): 200 ug via INTRACORONARY

## 2015-01-06 SURGICAL SUPPLY — 23 items
BALLN ANGIOSCULPT RX 2.5X10 (BALLOONS) ×3
BALLN EMERGE MR 2.5X12 (BALLOONS) ×3
BALLN ~~LOC~~ EUPHORA RX 2.75X15 (BALLOONS) ×3
BALLOON ANGIOSCULPT RX 2.5X10 (BALLOONS) IMPLANT
BALLOON EMERGE MR 2.5X12 (BALLOONS) IMPLANT
BALLOON ~~LOC~~ EUPHORA RX 2.75X15 (BALLOONS) IMPLANT
CATH INFINITI 5FR ANG PIGTAIL (CATHETERS) ×3 IMPLANT
CATH INFINITI 5FR MULTPACK ANG (CATHETERS) IMPLANT
CATH OPTITORQUE TIG 4.0 5F (CATHETERS) ×3 IMPLANT
CATH VISTA GUIDE 6FR JR4 (CATHETERS) ×2 IMPLANT
CUTTING BAL FLEXTOME RX2.75X10 (BALLOONS) ×2 IMPLANT
DEVICE RAD COMP TR BAND LRG (VASCULAR PRODUCTS) ×3 IMPLANT
GLIDESHEATH SLEND A-KIT 6F 22G (SHEATH) ×3 IMPLANT
KIT ENCORE 26 ADVANTAGE (KITS) ×2 IMPLANT
KIT HEART LEFT (KITS) ×3 IMPLANT
PACK CARDIAC CATHETERIZATION (CUSTOM PROCEDURE TRAY) ×3 IMPLANT
SHEATH PINNACLE 5F 10CM (SHEATH) IMPLANT
SYR MEDRAD MARK V 150ML (SYRINGE) ×3 IMPLANT
TRANSDUCER W/STOPCOCK (MISCELLANEOUS) ×3 IMPLANT
TUBING CIL FLEX 10 FLL-RA (TUBING) ×3 IMPLANT
WIRE COUGAR XT STRL 190CM (WIRE) ×2 IMPLANT
WIRE EMERALD 3MM-J .035X150CM (WIRE) IMPLANT
WIRE SAFE-T 1.5MM-J .035X260CM (WIRE) ×3 IMPLANT

## 2015-01-06 NOTE — Progress Notes (Signed)
UR Completed Akram Kissick Graves-Bigelow, RN,BSN [phone]  

## 2015-01-06 NOTE — Interval H&P Note (Signed)
Cath Lab Visit (complete for each Cath Lab visit)  Clinical Evaluation Leading to the Procedure:   ACS: No.  Non-ACS:    Anginal Classification: CCS III  Anti-ischemic medical therapy: Maximal Therapy (2 or more classes of medications)  Non-Invasive Test Results: No non-invasive testing performed  Prior CABG: No previous CABG      History and Physical Interval Note:  01/06/2015 2:04 PM  Russell Browning  has presented today for surgery, with the diagnosis of cp  The various methods of treatment have been discussed with the patient and family. After consideration of risks, benefits and other options for treatment, the patient has consented to  Procedure(s): Left Heart Cath and Coronary Angiography (N/A) as a surgical intervention .  [name] history has been reviewed, patient examined, no change in status, stable for surgery.  I have reviewed the patient's chart and labs.  Questions were answered to the patient's satisfaction.     Harol Shabazz A

## 2015-01-06 NOTE — Care Management Note (Signed)
Case Management Note  Patient Details  Name: Russell Browning MRN: [id number] Date of Birth: 01/09/1941  Subjective/Objective:  Pt admitted for cp. Plan for cardiac cath 01-06-15.                   Action/Plan: CM will continue to monitor for disposition needs.    Expected Discharge Date:                  Expected Discharge Plan:  Home/Self Care  In-House Referral:     Discharge planning Services  CM Consult  Post Acute Care Choice:    Choice offered to:     DME Arranged:    DME Agency:     HH Arranged:    HH Agency:     Status of Service:  In process, will continue to follow  Medicare Important Message Given:    Date Medicare IM Given:    Medicare IM give by:    Date Additional Medicare IM Given:    Additional Medicare Important Message give by:     If discussed at [place] of Stay Meetings, dates discussed:    Additional Comments:  Bethena Roys, RN 01/06/2015, 2:15 PM

## 2015-01-06 NOTE — Progress Notes (Signed)
ANTICOAGULATION CONSULT NOTE - Follow Up Consult  Pharmacy Consult for heparin Indication: chest pain/ACS  Allergies  Allergen Reactions  . Penicillins Other (See Comments)    Passed out  . Phenazopyridine Hcl Hives  . Ramipril Hives    Patient Measurements: Height: 5\' 10"  (177.8 cm) Weight: 134 lb 9.6 oz (61.054 kg) IBW/kg (Calculated) : 73 Heparin Dosing Weight: 61 kg  Vital Signs: Temp: 98.1 F (36.7 C) (07/25 0458) Temp Source: Oral (07/25 0458) BP: 115/60 mmHg (07/25 0458) Pulse Rate: 57 (07/25 0458)  Labs:  Recent Labs  01/03/15 1340 01/04/15 0431  01/04/15 2042 01/05/15 0412 01/06/15 0319  HGB 12.7* 11.7*  --   --  11.0* 11.1*  HCT 38.1* 34.9*  --   --  33.2* 32.8*  PLT 174 161  --   --  157 144*  LABPROT  --   --   --   --   --  14.5  INR  --   --   --   --   --  1.11  HEPARINUNFRC  --  0.44  < > 0.29* 0.33 0.33  CREATININE 0.90 1.02  --   --   --   --   TROPONINI <0.03  --   --   --   --   --   < > = values in this interval not displayed.  Estimated Creatinine Clearance: 55.7 mL/min (by C-G formula based on Cr of 1.02).    Assessment: 74yoM admitted 01/03/2015 with chest pain and ST depression. Hx of CAD (multiple stents) w/pacer, TIA, and metastatic prostate cancer. No PTA anticoagulants. Current HL 0.33 (therapeutic). H/H stable. Plt stable. No further bleeding reported.   Goal of Therapy:  Heparin level 0.3-0.7 units/ml Monitor platelets by anticoagulation protocol: Yes   Plan:  Continue heparin at 750 units/hr Monitor CBC, s/sx bleeding F/U after cath for plans with heparin.  Dimitri Ped, PharmD. Clinical Pharmacist Resident Pager: [phone]

## 2015-01-06 NOTE — Progress Notes (Signed)
Patient Name: Russell Browning Date of Encounter: 01/06/2015   SUBJECTIVE  Feeling well. Denies chest pain, sob or palpitation.   CURRENT MEDS . aspirin EC  81 mg Oral Daily  . atenolol  12.5 mg Oral Daily  . clobetasol ointment  1 application Topical Daily  . clonazePAM  1 mg Oral QHS  . clopidogrel  75 mg Oral Daily  . fentaNYL  25 mcg Transdermal Q72H  . oxyCODONE-acetaminophen  1 tablet Oral QID   And  . oxyCODONE  5 mg Oral Daily  . pantoprazole  40 mg Oral Daily  . polyethylene glycol  17 g Oral Daily  . senna-docusate  2 tablet Oral Daily  . simvastatin  40 mg Oral QHS  . sodium chloride  3 mL Intravenous Q12H  . venlafaxine XR  150 mg Oral Daily  . vitamin B-12  1,000 mcg Oral Daily    OBJECTIVE  Filed Vitals:   01/05/15 0459 01/05/15 1500 01/05/15 2055 01/06/15 0458  BP: 108/66 126/58 126/68 115/60  Pulse: 61 62 111 57  Temp: 97.8 F (36.6 C) 98.3 F (36.8 C) 98.3 F (36.8 C) 98.1 F (36.7 C)  TempSrc: Oral Oral Oral Oral  Resp: 18 16    Height:      Weight: 133 lb 12.8 oz (60.691 kg)   134 lb 9.6 oz (61.054 kg)  SpO2: 100% 100% 100% 98%    Intake/Output Summary (Last 24 hours) at 01/06/15 0753 Last data filed at 01/06/15 1610  Gross per 24 hour  Intake      0 ml  Output   1600 ml  Net  -1600 ml   Filed Weights   01/04/15 0608 01/05/15 0459 01/06/15 0458  Weight: 135 lb 9.3 oz (61.5 kg) 133 lb 12.8 oz (60.691 kg) 134 lb 9.6 oz (61.054 kg)    PHYSICAL EXAM  General: Pleasant, NAD. Neuro: Alert and oriented X 3. Moves all extremities spontaneously. Psych: Normal affect. HEENT:  Normal  Neck: Supple without bruits or JVD. Lungs:  Resp regular and unlabored, CTA. Heart: RRR no s3, s4, or murmurs. Abdomen: Soft, non-tender, non-distended, BS + x 4.  Extremities: No clubbing, cyanosis or edema. DP/PT/Radials 2+ and equal bilaterally.  Accessory Clinical Findings  CBC  Recent Labs  01/05/15 0412 01/06/15 0319  WBC 5.4 4.6  HGB 11.0*  11.1*  HCT 33.2* 32.8*  MCV 89.0 89.4  PLT 157 960*   Basic Metabolic Panel  Recent Labs  01/03/15 1340 01/04/15 0431  NA 136 137  K 4.0 4.0  CL 101 100*  CO2 28 29  GLUCOSE 100* 94  BUN 13 9  CREATININE 0.90 1.02  CALCIUM 9.2 9.0  Cardiac Enzymes  Recent Labs  01/03/15 1340  TROPONINI <0.03  Fasting Lipid Panel  Recent Labs  01/04/15 0431  CHOL 171  HDL 42  LDLCALC 108*  TRIG 106  CHOLHDL 4.1   Thyroid Function Tests  Recent Labs  01/03/15 2040  TSH 2.353    TELE  A paced at rate of 50s.   Radiology/Studies  Dg Chest 2 View  01/03/2015   CLINICAL DATA:  Left-sided chest pain.  EXAM: CHEST  2 VIEW  COMPARISON:  None available currently.  FINDINGS: The heart size and mediastinal contours are within normal limits. Hyperexpansion of the lungs is noted consistent with chronic obstructive pulmonary disease. No acute pulmonary disease is noted. No pneumothorax or pleural effusion is noted. Mild biapical scarring is noted. Left-sided pacemaker is noted. The  visualized skeletal structures are unremarkable.  IMPRESSION: Findings consistent with chronic obstructive pulmonary disease. No acute cardiopulmonary abnormality seen.   Electronically Signed   By: Marijo Conception, M.D.   On: 01/03/2015 14:07    ASSESSMENT AND PLAN 1. Unstable angina:   - Pt had CP while being transferred to [facility] hospital , associated with ST depression and fast HR. Plan for cath.  2. Sinoatrial node dysfunction : s/p Pacemaker 3. CAD (coronary artery disease), native coronary artery: Multiple RCA stents 2006.  Last myovue non ischemic 05/2014 4. Sinoatrial node dysfunction : s/p Medtronic Pacemaker 5. Mixed hyperlipidemia: Continue statin 6. Prostate cancer: PSA nagative   Signed, Bhagat,Bhavinkumar PA-C Pager [phone]  Patient seen, examined. Available data reviewed. Agree with findings, assessment, and plan as outlined by Robbie Lis, PA-C. The patient is interviewed and examined. He  has typical symptoms of unstable angina pectoris. The patient has had extensive CAD and symptoms feel similar to his past cardiac pain. He is now stable on IV heparin. Plans for cardiac catheterization and possible PCI outlined with the patient and his wife today. I have reviewed the risks, indications, and alternatives to cardiac catheterization and possible angioplasty/stenting with the patient. Risks include but are not limited to bleeding, infection, vascular injury, stroke, myocardial infection, arrhythmia, kidney injury, radiation-related injury in the case of prolonged fluoroscopy use, emergency cardiac surgery, and death. The patient understands the risks of serious complication is low (<5%).   Sherren Mocha, M.D. 01/06/2015 8:29 AM

## 2015-01-06 NOTE — H&P (View-Only) (Signed)
Patient Name: Russell Browning Date of Encounter: 01/06/2015   SUBJECTIVE  Feeling well. Denies chest pain, sob or palpitation.   CURRENT MEDS . aspirin EC  81 mg Oral Daily  . atenolol  12.5 mg Oral Daily  . clobetasol ointment  1 application Topical Daily  . clonazePAM  1 mg Oral QHS  . clopidogrel  75 mg Oral Daily  . fentaNYL  25 mcg Transdermal Q72H  . oxyCODONE-acetaminophen  1 tablet Oral QID   And  . oxyCODONE  5 mg Oral Daily  . pantoprazole  40 mg Oral Daily  . polyethylene glycol  17 g Oral Daily  . senna-docusate  2 tablet Oral Daily  . simvastatin  40 mg Oral QHS  . sodium chloride  3 mL Intravenous Q12H  . venlafaxine XR  150 mg Oral Daily  . vitamin B-12  1,000 mcg Oral Daily    OBJECTIVE  Filed Vitals:   01/05/15 0459 01/05/15 1500 01/05/15 2055 01/06/15 0458  BP: 108/66 126/58 126/68 115/60  Pulse: 61 62 111 57  Temp: 97.8 F (36.6 C) 98.3 F (36.8 C) 98.3 F (36.8 C) 98.1 F (36.7 C)  TempSrc: Oral Oral Oral Oral  Resp: 18 16    Height:      Weight: 133 lb 12.8 oz (60.691 kg)   134 lb 9.6 oz (61.054 kg)  SpO2: 100% 100% 100% 98%    Intake/Output Summary (Last 24 hours) at 01/06/15 0753 Last data filed at 01/06/15 3710  Gross per 24 hour  Intake      0 ml  Output   1600 ml  Net  -1600 ml   Filed Weights   01/04/15 0608 01/05/15 0459 01/06/15 0458  Weight: 135 lb 9.3 oz (61.5 kg) 133 lb 12.8 oz (60.691 kg) 134 lb 9.6 oz (61.054 kg)    PHYSICAL EXAM  General: Pleasant, NAD. Neuro: Alert and oriented X 3. Moves all extremities spontaneously. Psych: Normal affect. HEENT:  Normal  Neck: Supple without bruits or JVD. Lungs:  Resp regular and unlabored, CTA. Heart: RRR no s3, s4, or murmurs. Abdomen: Soft, non-tender, non-distended, BS + x 4.  Extremities: No clubbing, cyanosis or edema. DP/PT/Radials 2+ and equal bilaterally.  Accessory Clinical Findings  CBC  Recent Labs  01/05/15 0412 01/06/15 0319  WBC 5.4 4.6  HGB 11.0*  11.1*  HCT 33.2* 32.8*  MCV 89.0 89.4  PLT 157 626*   Basic Metabolic Panel  Recent Labs  01/03/15 1340 01/04/15 0431  NA 136 137  K 4.0 4.0  CL 101 100*  CO2 28 29  GLUCOSE 100* 94  BUN 13 9  CREATININE 0.90 1.02  CALCIUM 9.2 9.0  Cardiac Enzymes  Recent Labs  01/03/15 1340  TROPONINI <0.03  Fasting Lipid Panel  Recent Labs  01/04/15 0431  CHOL 171  HDL 42  LDLCALC 108*  TRIG 106  CHOLHDL 4.1   Thyroid Function Tests  Recent Labs  01/03/15 2040  TSH 2.353    TELE  A paced at rate of 50s.   Radiology/Studies  Dg Chest 2 View  01/03/2015   CLINICAL DATA:  Left-sided chest pain.  EXAM: CHEST  2 VIEW  COMPARISON:  None available currently.  FINDINGS: The heart size and mediastinal contours are within normal limits. Hyperexpansion of the lungs is noted consistent with chronic obstructive pulmonary disease. No acute pulmonary disease is noted. No pneumothorax or pleural effusion is noted. Mild biapical scarring is noted. Left-sided pacemaker is noted. The  visualized skeletal structures are unremarkable.  IMPRESSION: Findings consistent with chronic obstructive pulmonary disease. No acute cardiopulmonary abnormality seen.   Electronically Signed   By: Marijo Conception, M.D.   On: 01/03/2015 14:07    ASSESSMENT AND PLAN 1. Unstable angina:   - Pt had CP while being transferred to [facility] hospital , associated with ST depression and fast HR. Plan for cath.  2. Sinoatrial node dysfunction : s/p Pacemaker 3. CAD (coronary artery disease), native coronary artery: Multiple RCA stents 2006.  Last myovue non ischemic 05/2014 4. Sinoatrial node dysfunction : s/p Medtronic Pacemaker 5. Mixed hyperlipidemia: Continue statin 6. Prostate cancer: PSA nagative   Signed, Bhagat,Bhavinkumar PA-C Pager [phone]  Patient seen, examined. Available data reviewed. Agree with findings, assessment, and plan as outlined by Robbie Lis, PA-C. The patient is interviewed and examined. He  has typical symptoms of unstable angina pectoris. The patient has had extensive CAD and symptoms feel similar to his past cardiac pain. He is now stable on IV heparin. Plans for cardiac catheterization and possible PCI outlined with the patient and his wife today. I have reviewed the risks, indications, and alternatives to cardiac catheterization and possible angioplasty/stenting with the patient. Risks include but are not limited to bleeding, infection, vascular injury, stroke, myocardial infection, arrhythmia, kidney injury, radiation-related injury in the case of prolonged fluoroscopy use, emergency cardiac surgery, and death. The patient understands the risks of serious complication is low (<2%).   Sherren Mocha, M.D. 01/06/2015 8:29 AM

## 2015-01-06 NOTE — Care Management Important Message (Signed)
Important Message  Patient Details  Name: Russell Browning MRN: [id number] Date of Birth: [birth year]   Medicare Important Message Given:  Yes-second notification given    Pricilla Handler 01/06/2015, 2:38 PM

## 2015-01-06 NOTE — Progress Notes (Signed)
PT Cancellation Note  Patient Details Name: Russell Browning MRN: [id number] DOB: 30-Jun-1940   Cancelled Treatment:    Reason Eval/Treat Not Completed: Medical issues which prohibited therapy.  Pt getting cardiac cath.  Will check back as medically stable for therapy.    Denice Bors 01/06/2015, 4:21 PM

## 2015-01-07 ENCOUNTER — Encounter (HOSPITAL_COMMUNITY): Payer: Self-pay | Admitting: Cardiovascular Disease

## 2015-01-07 LAB — CBC
HEMATOCRIT: 30.3 % — AB (ref 39.0–52.0)
HEMOGLOBIN: 10.3 g/dL — AB (ref 13.0–17.0)
MCH: 30.5 pg (ref 26.0–34.0)
MCHC: 34 g/dL (ref 30.0–36.0)
MCV: 89.6 fL (ref 78.0–100.0)
PLATELETS: 142 10*3/uL — AB (ref 150–400)
RBC: 3.38 MIL/uL — ABNORMAL LOW (ref 4.22–5.81)
RDW: 13.5 % (ref 11.5–15.5)
WBC: 5.3 10*3/uL (ref 4.0–10.5)

## 2015-01-07 LAB — BASIC METABOLIC PANEL
Anion gap: 7 (ref 5–15)
BUN: 11 mg/dL (ref 6–20)
CO2: 28 mmol/L (ref 22–32)
Calcium: 8.7 mg/dL — ABNORMAL LOW (ref 8.9–10.3)
Chloride: 106 mmol/L (ref 101–111)
Creatinine, Ser: 0.92 mg/dL (ref 0.61–1.24)
GFR calc Af Amer: >60 mL/min (ref >60)
GFR calc non Af Amer: >60 mL/min (ref >60)
Glucose, Bld: 92 mg/dL (ref 65–99)
Potassium: 4 mmol/L (ref 3.5–5.1)
Sodium: 141 mmol/L (ref 135–145)

## 2015-01-07 MED ORDER — TICAGRELOR 90 MG PO TABS: 90.0000 mg | ORAL_TABLET | Freq: Two times a day (BID) | ORAL | Status: DC

## 2015-01-07 MED ORDER — ASPIRIN 81 MG PO TBEC: 81.0000 mg | DELAYED_RELEASE_TABLET | Freq: Every day | ORAL | Status: AC

## 2015-01-07 NOTE — Progress Notes (Signed)
PT Cancellation Note  Patient Details Name: Russell Browning MRN: [id number] DOB: Dec 12, 1940   Cancelled Treatment:    Reason Eval/Treat Not Completed: PT screened, no needs identified, will sign off   Working with Cardiac Rehab, and walked 1000 feet without difficulty; Rosanne Gutting, RN let me know he does have occasional upper thigh pain;  I encourage Russell Browning to contact primary care physician, and consider Outpatient PT to address that upper thigh pain if it ever interferes with his daily activities;  Thanks,  Roney Marion, PT  Acute Rehabilitation Services Pager [phone] Office [phone]    Roney Marion [facility] 01/07/2015, 8:53 AM

## 2015-01-07 NOTE — Progress Notes (Addendum)
CM spoke with pt regarding Brilintal. Brilinta booklet was already provided by nursing staff with 30-free card enclosed @ bedside. Pt verbally stated understanding of how to use card. [facility]  [place], [place] called by CM to confirm medication is in stock, pt made aware. No other needs identified per CM @ present time. Whitman Hero RN,BSN,CM [phone]

## 2015-01-07 NOTE — Progress Notes (Signed)
Patient: Russell Browning / Admit Date: 01/03/2015 / Date of Encounter: 01/07/2015, 7:26 AM   Subjective: Feeling great. No CP or SOB.   Objective: Telemetry: NSR, occasional atrial pacing Physical Exam: Blood pressure 114/61, pulse 62, temperature 98.3 F (36.8 C), temperature source Oral, resp. rate 16, height 5\' 10"  (1.778 m), weight 135 lb 9.3 oz (61.5 kg), SpO2 99 %. General: Well developed, well nourished WM, in no acute distress. Head: Normocephalic, atraumatic, sclera non-icteric, no xanthomas, nares are without discharge. Neck: Negative for carotid bruits. JVP not elevated. Lungs: Clear bilaterally to auscultation without wheezes, rales, or rhonchi. Breathing is unlabored. Heart: RRR S1 S2 without murmurs, rubs, or gallops.  Abdomen: Soft, non-tender, non-distended with normoactive bowel sounds. No rebound/guarding. Extremities: No clubbing or cyanosis. No edema. Distal pedal pulses are 2+ and equal bilaterally. Right radial cath site without hematoma or ecchymosis; good pulse Neuro: Alert and oriented X 3. Moves all extremities spontaneously. Psych:  Responds to questions appropriately with a normal affect.   Intake/Output Summary (Last 24 hours) at 01/07/15 0726 Last data filed at 01/07/15 0641  Gross per 24 hour  Intake 2392.5 ml  Output   1650 ml  Net  742.5 ml    Inpatient Medications:  . aspirin EC  81 mg Oral Daily  . atenolol  12.5 mg Oral Daily  . clobetasol ointment  1 application Topical Daily  . clonazePAM  1 mg Oral QHS  . fentaNYL  25 mcg Transdermal Q72H  . oxyCODONE-acetaminophen  1 tablet Oral QID   And  . oxyCODONE  5 mg Oral Daily  . pantoprazole  40 mg Oral Daily  . polyethylene glycol  17 g Oral Daily  . senna-docusate  2 tablet Oral Daily  . simvastatin  40 mg Oral QHS  . sodium chloride  3 mL Intravenous Q12H  . ticagrelor  90 mg Oral BID  . venlafaxine XR  150 mg Oral Daily  . vitamin B-12  1,000 mcg Oral Daily   Infusions:  . sodium  chloride 125 mL/hr at 01/07/15 0600    Labs:  Recent Labs  01/07/15 0351  NA 141  K 4.0  CL 106  CO2 28  GLUCOSE 92  BUN 11  CREATININE 0.92  CALCIUM 8.7*   No results for input(s): AST, ALT, ALKPHOS, BILITOT, PROT, ALBUMIN in the last 72 hours.  Recent Labs  01/06/15 0319 01/07/15 0351  WBC 4.6 5.3  HGB 11.1* 10.3*  HCT 32.8* 30.3*  MCV 89.4 89.6  PLT 144* 142*   No results for input(s): CKTOTAL, CKMB, TROPONINI in the last 72 hours. Invalid input(s): POCBNP No results for input(s): HGBA1C in the last 72 hours.   Radiology/Studies:  Dg Chest 2 View  01/03/2015   CLINICAL DATA:  Left-sided chest pain.  EXAM: CHEST  2 VIEW  COMPARISON:  None available currently.  FINDINGS: The heart size and mediastinal contours are within normal limits. Hyperexpansion of the lungs is noted consistent with chronic obstructive pulmonary disease. No acute pulmonary disease is noted. No pneumothorax or pleural effusion is noted. Mild biapical scarring is noted. Left-sided pacemaker is noted. The visualized skeletal structures are unremarkable.  IMPRESSION: Findings consistent with chronic obstructive pulmonary disease. No acute cardiopulmonary abnormality seen.   Electronically Signed   By: Marijo Conception, M.D.   On: 01/03/2015 14:07     Assessment and Plan  1. Unstable angina/CAD (hx of RCA stents in 2006)- s/p PTCA to the mid-distal previously placed RCA stents,  otherwise nonobstructive CAD for continued medical therapy. Now on ASA/Brilinta with recommendation to continue indefinitely.  2. Sinus node dysfunction s/p Medtronic pacemaker - followed by EP. 3. Mixed HLD - consider switching Zocor to something more potent. LDL was 108 on Zocor 40. 4. Mild anemia - Hgb appears to run in the 11 range, down to 10.3 today. No evidence of bleeding. Possibly r/t hydration/procedure. 5. Metastatic prostate CA, on lupron.  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  Signed, Melina Copa PA-C Pager: [phone]    I have personally seen and examined this patient with Melina Copa, PA-C. I agree with the assessment and plan as outlined above. Pt with unstable angina. Now s/p PCI of the RCA in two segments of previous stenting with stent restenosis, treated with cutting balloon angioplasty. Doing well this am. On appropriate therapy. Agree with discharge home today and follow up with Dr. Domenic Polite who can discuss changing his statin therapy.   Mendy Chou 01/07/2015 7:50 AM

## 2015-01-07 NOTE — Discharge Summary (Signed)
Discharge Summary   Patient ID: Russell Browning MRN: [id number], DOB/AGE: 01/21/1941 74 y.o. Admit date: 01/03/2015 D/C date:     01/07/2015  Primary Care Provider: Gar Ponto, MD Primary Cardiologist: Dr. Domenic Polite in [place]  Primary Discharge Diagnoses:  1. Unstable angina/CAD (hx of RCA stents in 2006)- s/p PTCA to the mid-distal previously placed RCA stents, otherwise nonobstructive CAD for continued medical therapy 2. Sinus node dysfunction s/p Medtronic pacemaker 3. Mixed HLD 4. Mild anemia 5. Metastatic prostate CA, on Lupron  PMH:  1. Hx of TIAs 2. Hx of pacemaker lead failure  Hospital Course: Mr. Niblack is a 74 y/o M with history of CAD s/p multiple stents in 2006, metastatic prostate CA on lupron, sinus node dysfunction s/p Medtronic pacemaker, HLD, hx of TIAs who presented to [facility] with worsening chest pain. He has had SSCP radiating to his left arm, lasting minutes, worse with exertion at times. It was relieved with NTG on multiple occasions. Symptoms were felt c/w [place]. He ruled out for MI. He was transferred to [facility] over the weekend with for plan for cath on Monday 7/25. He was started on heparin. He underwent cardiac cath on 01/06/15 and ultimately underwent PTCA to the mid-distal previously placed RCA stents, otherwise nonobstructive CAD for continued medical therapy. DAPT indefinitely was recommended. He was changed to Brilinta this admission. He tolerated cath well. He ambulated well with cardiac rehab. He was noted to have mild decrease in Hgb- baseline appears in the mid 11s, he was 10.3 today, possibly related to daily blood draws, procedure and pre-hydration. No evidence of bleeding reported. Of note LDL was 108 on Zocor 40mg . Dr. Angelena Form would like to defer discussion regarding statin titration to Dr. Domenic Polite. Dr. Angelena Form has seen and examined the patient today and feels he is stable for discharge. He received 30 day free card for Brilinta and  regular refills. He was also seen by cardiac rehab and did well - he declined phase 2 referral.    Discharge Vitals: Blood pressure 124/60, pulse 57, temperature 98.4 F (36.9 C), temperature source Oral, resp. rate 19, height 5\' 10"  (1.778 m), weight 135 lb 9.3 oz (61.5 kg), SpO2 98 %.  Labs: Lab Results  Component Value Date   WBC 5.3 01/07/2015   HGB 10.3* 01/07/2015   HCT 30.3* 01/07/2015   MCV 89.6 01/07/2015   PLT 142* 01/07/2015    Recent Labs Lab 01/07/15 0351  NA 141  K 4.0  CL 106  CO2 28  BUN 11  CREATININE 0.92  CALCIUM 8.7*  GLUCOSE 92    Lab Results  Component Value Date   CHOL 171 01/04/2015   HDL 42 01/04/2015   LDLCALC 108* 01/04/2015   TRIG 106 01/04/2015    Diagnostic Studies/Procedures   Dg Chest 2 View  01/03/2015   CLINICAL DATA:  Left-sided chest pain.  EXAM: CHEST  2 VIEW  COMPARISON:  None available currently.  FINDINGS: The heart size and mediastinal contours are within normal limits. Hyperexpansion of the lungs is noted consistent with chronic obstructive pulmonary disease. No acute pulmonary disease is noted. No pneumothorax or pleural effusion is noted. Mild biapical scarring is noted. Left-sided pacemaker is noted. The visualized skeletal structures are unremarkable.  IMPRESSION: Findings consistent with chronic obstructive pulmonary disease. No acute cardiopulmonary abnormality seen.   Electronically Signed   By: Marijo Conception, M.D.   On: 01/03/2015 14:07   Cardiac catheterization this admission, please see full  report and above for summary.   Discharge Medications   Current Discharge Medication List    START taking these medications   Details  aspirin EC 81 MG EC tablet Take 1 tablet (81 mg total) by mouth daily. Qty: 30 tablet, Refills: 11    ticagrelor (BRILINTA) 90 MG TABS tablet Take 1 tablet (90 mg total) by mouth 2 (two) times daily. Qty: 60 tablet, Refills: 11      CONTINUE these medications which have NOT CHANGED    Details  atenolol (TENORMIN) 50 MG tablet Take 12.5 mg by mouth daily. Take 1/4 tablet every day    Calcium Carbonate-Vitamin D (CALCIUM-VITAMIN D) 500-200 MG-UNIT per tablet Take 1 tablet by mouth daily.    clobetasol ointment (TEMOVATE) 7.54 % Apply 1 application topically daily.    clonazePAM (KLONOPIN) 1 MG tablet Take 1 mg by mouth at bedtime.     denosumab (XGEVA) 120 MG/1.7ML SOLN injection Inject 120 mg into the skin every 6 (six) weeks.    fentaNYL (DURAGESIC - DOSED MCG/HR) 25 MCG/HR Place 1 patch onto the skin every 3 (three) days.    leuprolide (LUPRON) 22.5 MG injection Inject 22.5 mg into the muscle every 3 (three) months.    nitroGLYCERIN (NITROSTAT) 0.4 MG SL tablet Place 1 tablet (0.4 mg total) under the tongue every 5 (five) minutes as needed for chest pain.     oxyCODONE-acetaminophen (PERCOCET) 10-325 MG per tablet Take 1 tablet by mouth daily.     pantoprazole (PROTONIX) 40 MG tablet Take 40 mg by mouth daily.    polyethylene glycol (MIRALAX / GLYCOLAX) packet Take 17 g by mouth daily.    senna-docusate (EQ SENNA-S) 8.6-50 MG per tablet Take 2 tablets by mouth daily.    Associated Diagnoses: Malignant neoplasm of prostate    simvastatin (ZOCOR) 40 MG tablet Take 40 mg by mouth at bedtime.      venlafaxine XR (EFFEXOR-XR) 150 MG 24 hr capsule Take 150 mg by mouth daily.    vitamin B-12 (CYANOCOBALAMIN) 1000 MCG tablet Take 1,000 mcg by mouth daily.        STOP taking these medications     clopidogrel (PLAVIX) 75 MG tablet         Disposition   The patient will be discharged in stable condition to home. Discharge Instructions    Diet - low sodium heart healthy    Complete by:  As directed      Increase activity slowly    Complete by:  As directed   No driving for 2 days. No lifting over 5 lbs for 1 week. No sexual activity for 1 week. You may return to work in 1 week. Keep procedure site clean & dry. If you notice increased pain, swelling, bleeding  or pus, call/return!  You may shower, but no soaking baths/hot tubs/pools for 1 week.  Your doctor has STOPPED Plavix. Your new meds are Aspirin and Brilinta.          Follow-up Information    Follow up with Rozann Lesches, MD.   Specialty:  Cardiology   Why:  01/17/15 at 11:40am   Contact information:   [place] [address] [phone]         Duration of Discharge Encounter: Greater than 30 minutes including physician and PA time.  Rudean Hitt, [place] PA-C 01/07/2015, 9:54 AM

## 2015-01-07 NOTE — Progress Notes (Signed)
CARDIAC REHAB PHASE I   PRE:  Rate/Rhythm: 60 SR  BP:  Lying: 124/60        SaO2: 97 RA  MODE:  Ambulation: 1000 ft   POST:  Rate/Rhythm: 105 paced  BP:  Sitting: 142/76         SaO2: 100 RA  Pt lying in bed, drinking coffee which he said he walked to the nurses station to get, states he is hoping to go home today. Pt ambulated 1000 ft on RA, independent, brisk, steady gait, tolerated well.  Pt denies cp, dizziness, DOE, declined rest stop. PT came to see pt, agree pt does not need PT services at this time. Completed PCI education with pt wife at bedside.  Reviewed risk factors, anti-platelet therapy, activity restrictions, ntg, exercise, heart healthy diet and phase 2 cardiac rehab. Pt verbalized understanding. Pt states he has participated in phase 2 cardiac rehab before and is not interested this time, pt declines phase 2 referral. Pt encouraged to communicate with his cardiologist if he becomes interested in phase 2 cardiac rehab. Pt to bed after walk per pt request, call bell within reach.  [phone]   Lenna Sciara, RN, BSN 01/07/2015 9:09 AM

## 2015-01-07 NOTE — Progress Notes (Signed)
TR BAND REMOVAL  LOCATION:    right radial  DEFLATED PER PROTOCOL:    Yes.    TIME BAND OFF / DRESSING APPLIED:    21:30 (01/06/15)   SITE UPON ARRIVAL:    Level 0  SITE AFTER BAND REMOVAL:    Level 0  REVERSE ALLEN'S TEST:     positive  CIRCULATION SENSATION AND MOVEMENT:    Within Normal Limits   Yes.    COMMENTS:

## 2015-01-13 DIAGNOSIS — I10 Essential (primary) hypertension: Secondary | ICD-10-CM | POA: Diagnosis not present

## 2015-01-13 DIAGNOSIS — K210 Gastro-esophageal reflux disease with esophagitis: Secondary | ICD-10-CM | POA: Diagnosis not present

## 2015-01-13 DIAGNOSIS — E780 Pure hypercholesterolemia: Secondary | ICD-10-CM | POA: Diagnosis not present

## 2015-01-13 DIAGNOSIS — E782 Mixed hyperlipidemia: Secondary | ICD-10-CM | POA: Diagnosis not present

## 2015-01-17 ENCOUNTER — Encounter: Payer: Self-pay | Admitting: Cardiology

## 2015-01-17 ENCOUNTER — Ambulatory Visit (INDEPENDENT_AMBULATORY_CARE_PROVIDER_SITE_OTHER): Payer: Medicare Other | Admitting: Cardiology

## 2015-01-17 ENCOUNTER — Other Ambulatory Visit: Payer: Self-pay | Admitting: *Deleted

## 2015-01-17 VITALS — BP 101/66 | HR 81 | Ht 70.0 in | Wt 136.0 lb

## 2015-01-17 DIAGNOSIS — I2510 Atherosclerotic heart disease of native coronary artery without angina pectoris: Secondary | ICD-10-CM | POA: Diagnosis not present

## 2015-01-17 DIAGNOSIS — F331 Major depressive disorder, recurrent, moderate: Secondary | ICD-10-CM | POA: Diagnosis not present

## 2015-01-17 DIAGNOSIS — I200 Unstable angina: Secondary | ICD-10-CM | POA: Diagnosis not present

## 2015-01-17 DIAGNOSIS — K219 Gastro-esophageal reflux disease without esophagitis: Secondary | ICD-10-CM | POA: Diagnosis not present

## 2015-01-17 DIAGNOSIS — E782 Mixed hyperlipidemia: Secondary | ICD-10-CM | POA: Diagnosis not present

## 2015-01-17 DIAGNOSIS — I208 Other forms of angina pectoris: Secondary | ICD-10-CM

## 2015-01-17 DIAGNOSIS — M545 Low back pain: Secondary | ICD-10-CM | POA: Diagnosis not present

## 2015-01-17 DIAGNOSIS — I495 Sick sinus syndrome: Secondary | ICD-10-CM | POA: Diagnosis not present

## 2015-01-17 DIAGNOSIS — G2581 Restless legs syndrome: Secondary | ICD-10-CM | POA: Diagnosis not present

## 2015-01-17 DIAGNOSIS — C61 Malignant neoplasm of prostate: Secondary | ICD-10-CM | POA: Diagnosis not present

## 2015-01-17 DIAGNOSIS — I10 Essential (primary) hypertension: Secondary | ICD-10-CM | POA: Diagnosis not present

## 2015-01-17 NOTE — Progress Notes (Signed)
Cardiology Office Note  Date: 01/17/2015   ID: Russell Browning, DOB November 14, 1940, MRN [id number]  PCP: Gar Ponto, MD  Primary Cardiologist: Rozann Lesches, MD   Chief Complaint  Patient presents with  . Hospitalization Follow-up    History of Present Illness: Russell Browning is a 74 y.o. male last seen in June at which time he was clinically stable on medical therapy. Record review finds hospitalization later in July with unstable angina symptoms. He ruled out for myocardial infarction and underwent cardiac catheterization on July 25 with angioplasty of the mid to distal RCA at site of prior stent implantation, otherwise documentation of nonobstructive left system disease.  He is here today with a friend for a follow-up visit. He state that he feels much better, no recurring chest pain. We reviewed his medications, he reports compliance with aspirin and Brilinta. No bleeding problems.  We also reviewed his recent cholesterol numbers, LDL was 108 on Zocor. He does report modifying his diet, and we will plan to recheck lipids around the time of his next visit, could consider switching to a more potent status statin if needed.   Past Medical History  Diagnosis Date  . History of TIAs   . Mixed hyperlipidemia   . Sinus node dysfunction     Medtronic pacemaker - Dr. Rayann Heman  . CAD (coronary artery disease), native coronary artery     a. DES to proximal, mid, and distal RCA 2006. b. 12/2014 - [place] s/p  PTCA to the mid-distal previously placed RCA stents, otherwise nonobstructive CAD for continued medical therapy.  . 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 Prostate cancer metastatic to bone   . Anemia     Past Surgical History  Procedure Laterality Date  . Pacemaker insertion      MDT  . Cardiac catheterization N/A 01/06/2015    Procedure: Left Heart Cath and Coronary  Angiography;  Surgeon: Troy Sine, MD;  Location: [facility];  Service: Cardiovascular;  Laterality: N/A;    Current Outpatient Prescriptions  Medication Sig Dispense Refill  . aspirin EC 81 MG EC tablet Take 1 tablet (81 mg total) by mouth daily. 30 tablet 11  . atenolol (TENORMIN) 50 MG tablet Take 12.5 mg by mouth daily. Take 1/4 tablet every day    . Calcium Carbonate-Vitamin D (CALCIUM-VITAMIN D) 500-200 MG-UNIT per tablet Take 1 tablet by mouth daily.    . clobetasol ointment (TEMOVATE) 8.52 % Apply 1 application topically daily.    . clonazePAM (KLONOPIN) 1 MG tablet Take 1 mg by mouth at bedtime.     Marland Kitchen denosumab (XGEVA) 120 MG/1.7ML SOLN injection Inject 120 mg into the skin every 6 (six) weeks.    . fentaNYL (DURAGESIC - DOSED MCG/HR) 25 MCG/HR Place 1 patch onto the skin every 3 (three) days.    Marland Kitchen leuprolide (LUPRON) 22.5 MG injection Inject 22.5 mg into the muscle every 3 (three) months.    . nitroGLYCERIN (NITROSTAT) 0.4 MG SL tablet Place 1 tablet (0.4 mg total) under the tongue every 5 (five) minutes as needed for chest pain. 25 tablet 3  . oxyCODONE-acetaminophen (PERCOCET) 10-325 MG per tablet Take 1 tablet by mouth daily.     . pantoprazole (PROTONIX) 40 MG tablet Take 40 mg by mouth daily.    . polyethylene glycol (MIRALAX / GLYCOLAX) packet Take 17  g by mouth daily.    Marland Kitchen senna-docusate (EQ SENNA-S) 8.6-50 MG per tablet Take 2 tablets by mouth daily. 60 tablet 3  . simvastatin (ZOCOR) 40 MG tablet Take 40 mg by mouth at bedtime.      . THYROID PO Take 1 tablet by mouth daily. ? Mg - was just prescribed today by PMD.    . ticagrelor (BRILINTA) 90 MG TABS tablet Take 1 tablet (90 mg total) by mouth 2 (two) times daily. 60 tablet 11  . venlafaxine XR (EFFEXOR-XR) 150 MG 24 hr capsule Take 150 mg by mouth daily.    . vitamin B-12 (CYANOCOBALAMIN) 1000 MCG tablet Take 1,000 mcg by mouth daily.       No current facility-administered medications for this visit.     Allergies:  Penicillins; Phenazopyridine hcl; and Ramipril   Social History: The patient  reports that he quit smoking about 49 years ago. His smoking use included Cigarettes. He started smoking about 63 years ago. He has a 5 pack-year smoking history. He quit smokeless tobacco use about 36 years ago. His smokeless tobacco use included Chew. He reports that he does not drink alcohol or use illicit drugs.   ROS:  Please see the history of present illness. Otherwise, complete review of systems is positive for none.  All other systems are reviewed and negative.   Physical Exam: VS:  BP 101/66 mmHg  Pulse 81  Ht 5\' 10"  (1.778 m)  Wt 136 lb (61.689 kg)  BMI 19.51 kg/m2  SpO2 100%, BMI Body mass index is 19.51 kg/(m^2).  Wt Readings from Last 3 Encounters:  01/17/15 136 lb (61.689 kg)  01/07/15 135 lb 9.3 oz (61.5 kg)  01/01/15 140 lb (63.504 kg)     Patient appears comfortable at rest. HEENT: Conjunctiva and lids normal, oropharynx clear. Neck: Supple, no elevated JVP or carotid bruits, no thyromegaly. Thorax: Device pocket site well-healed. Lungs: Clear to auscultation, nonlabored breathing at rest. Cardiac: Regular rate and rhythm, no S3, soft systolic murmur. Abdomen: Soft, nontender, bowel sounds present, no guarding or rebound. Extremities: No pitting edema, distal pulses 2+. Skin: Warm and dry. Musculoskeletal: No kyphosis. Neuropsychiatric: Alert and oriented x3, affect grossly appropriate.   ECG: ECG from 01/07/2015 showed atrial pacing with LVH.   Recent Labwork: 12/25/2014: ALT 13; AST 18 01/03/2015: TSH 2.353 01/07/2015: BUN 11; Creatinine, Ser 0.92; Hemoglobin 10.3*; Platelets 142*; Potassium 4.0; Sodium 141     Component Value Date/Time   CHOL 171 01/04/2015 0431   TRIG 106 01/04/2015 0431   HDL 42 01/04/2015 0431   CHOLHDL 4.1 01/04/2015 0431   VLDL 21 01/04/2015 0431   LDLCALC 108* 01/04/2015 0431    Other Studies Reviewed Today:  Cardiac  catheterization 01/06/2015: Conclusion     Mid LAD lesion, 50% stenosed.  Dist LAD lesion, 40% stenosed.  Dist RCA lesion, 99% stenosed. There is a 0% residual stenosis post intervention. The lesion was previously treated with a stent (unknown type) .  Mid RCA lesion, 75% stenosed. There is a 0% residual stenosis post intervention. The lesion was previously treated with a stent (unknown type) .  RPDA lesion, 50% stenosed.  Prox RCA-1 lesion, 10% stenosed. The lesion was previously treated with a stent (unknown type) .  Prox RCA-2 lesion, 30% stenosed.  Mid RCA to Dist RCA lesion, 35% stenosed.  Low-normal ejection fraction at 50% with a small focal region of mid, basal inferior hypocontractility.  Two-vessel coronary obstructive disease with 50 and 40% proximal LAD  stenoses and 40% mid LAD stenosis; normal left circumflex coronary artery; and RCA with 4 previously placed stents and a proximal RCA stent without significant narrowing, 10 the mid RCA stents with 80% eccentric in-stent restenosis in the region of an marginal branch takeoff followed by 70% stenosis within the stented segment, 5% narrowing after the mid stent, and subtotal/total occlusion of the distal stent with TIMI 0 flow to the PDA vessel, but with flow to the PLA vessel and evidence for left to right collaterals to the PDA and PLA.  Successful percutaneous coronary intervention with PTCA, Angiosculpt scoring balloon to the mid and distal previously placed RCA stents, and ultimate noncompliant balloon dilatation to 2.75 mm with the 99% in-stent restenosis in the distal stent being reduced to 20%, the 80% mid eccentric stent stenosis being reduced to 20% and the 70% stenosis being reduced to 0%.  RECOMMENDATION:  Continued dual antiplatelet therapy indefinitely. Medical therapy for concomitant CAD.      Assessment and Plan:  1. Symptomatically stable now status post presentation with unstable angina requiring  angioplasty of the mid to distal RCA at previous stent site. Plan is to continue current medical regimen, follow up in 3 months.  2. Hyperlipidemia, recent LDL 108. He continues on Zocor 40 mg daily, making additional diet modifications. We will recheck lipid panel around the time of his next visit and can consider switching to a more potent statin if needed.  3. Sinus node dysfunction status post Medtronic pacemaker, followed by Dr. Rayann Heman.  Current medicines were reviewed with the patient today.   Orders Placed This Encounter  Procedures  . Lipid panel    Disposition: FU with me in 3 months.   Signed, Satira Sark, MD, [facility] 01/17/2015 11:56 AM    [facility] [place], [place], [place] [address] Phone: [phone]; Fax: [phone]

## 2015-01-17 NOTE — Patient Instructions (Signed)
Your physician recommends that you continue on your current medications as directed. Please refer to the Current Medication list given to you today. Your physician recommends that you return for a FASTING lipid profile: in 3 months just before your next visit. Your physician recommends that you schedule a follow-up appointment in: 3 months.

## 2015-02-06 ENCOUNTER — Telehealth: Payer: Self-pay | Admitting: Cardiology

## 2015-02-06 NOTE — Telephone Encounter (Signed)
Headache is listed as a potential side effect of Brilinta. It is very important that he stay on dual antiplatelets therapy with recent drug-eluting stent however. We could try and switch him back to Plavix and aspirin and see if he feels better.

## 2015-02-06 NOTE — Telephone Encounter (Signed)
Patient would like to speak with nurse regarding medication instructions. / tg

## 2015-02-06 NOTE — Telephone Encounter (Signed)
Patient c/o headache that started this past Sunday morning and trembling in his legs. Patient thinks the headache may be coming from [place]. Patient is currently on oxycodone 10/325 mg one tablet daily and said it doesn't help the headache. No c/o chest pain, dizziness or sob. Patient advised that because he recently had stents placed, that it was very important to stay on the brilinta. Patient advised that the message would be sent to his physician for alternative options.

## 2015-02-06 NOTE — Telephone Encounter (Signed)
I spoke with patient,he wants to try and stay on Brilinta as he was told the Plavix had "worn out". He will update [place] id things change

## 2015-02-07 DIAGNOSIS — E782 Mixed hyperlipidemia: Secondary | ICD-10-CM | POA: Diagnosis not present

## 2015-02-10 ENCOUNTER — Telehealth: Payer: Self-pay | Admitting: *Deleted

## 2015-02-10 NOTE — Telephone Encounter (Signed)
-----   Message from Satira Sark, MD sent at 02/10/2015 12:34 PM EDT ----- Reviewed. Lipid numbers look better, total cholesterol 144 and LDL down to 77.

## 2015-02-12 NOTE — Telephone Encounter (Signed)
Pt aware, routed to pcp 

## 2015-02-12 NOTE — Telephone Encounter (Signed)
-----   Message from Samuel G McDowell, MD sent at 02/10/2015 12:34 PM EDT ----- Reviewed. Lipid numbers look better, total cholesterol 144 and LDL down to 77. 

## 2015-02-20 DIAGNOSIS — E782 Mixed hyperlipidemia: Secondary | ICD-10-CM | POA: Diagnosis not present

## 2015-02-20 DIAGNOSIS — K210 Gastro-esophageal reflux disease with esophagitis: Secondary | ICD-10-CM | POA: Diagnosis not present

## 2015-02-20 DIAGNOSIS — I2510 Atherosclerotic heart disease of native coronary artery without angina pectoris: Secondary | ICD-10-CM | POA: Diagnosis not present

## 2015-02-20 DIAGNOSIS — I10 Essential (primary) hypertension: Secondary | ICD-10-CM | POA: Diagnosis not present

## 2015-02-20 DIAGNOSIS — C61 Malignant neoplasm of prostate: Secondary | ICD-10-CM | POA: Diagnosis not present

## 2015-02-20 DIAGNOSIS — F325 Major depressive disorder, single episode, in full remission: Secondary | ICD-10-CM | POA: Diagnosis not present

## 2015-03-10 DIAGNOSIS — Z23 Encounter for immunization: Secondary | ICD-10-CM | POA: Diagnosis not present

## 2015-03-10 DIAGNOSIS — S4392XA Sprain of unspecified parts of left shoulder girdle, initial encounter: Secondary | ICD-10-CM | POA: Diagnosis not present

## 2015-03-17 DIAGNOSIS — S4392XA Sprain of unspecified parts of left shoulder girdle, initial encounter: Secondary | ICD-10-CM | POA: Diagnosis not present

## 2015-03-20 DIAGNOSIS — M25512 Pain in left shoulder: Secondary | ICD-10-CM | POA: Diagnosis not present

## 2015-03-20 DIAGNOSIS — M6281 Muscle weakness (generalized): Secondary | ICD-10-CM | POA: Diagnosis not present

## 2015-03-21 ENCOUNTER — Encounter: Payer: Medicare Other | Admitting: Internal Medicine

## 2015-03-21 ENCOUNTER — Ambulatory Visit (INDEPENDENT_AMBULATORY_CARE_PROVIDER_SITE_OTHER): Payer: Medicare Other | Admitting: Internal Medicine

## 2015-03-21 ENCOUNTER — Encounter: Payer: Self-pay | Admitting: Internal Medicine

## 2015-03-21 VITALS — BP 110/72 | HR 63 | Ht 69.0 in | Wt 134.8 lb

## 2015-03-21 DIAGNOSIS — I495 Sick sinus syndrome: Secondary | ICD-10-CM | POA: Diagnosis not present

## 2015-03-21 DIAGNOSIS — I2510 Atherosclerotic heart disease of native coronary artery without angina pectoris: Secondary | ICD-10-CM | POA: Diagnosis not present

## 2015-03-21 DIAGNOSIS — Z950 Presence of cardiac pacemaker: Secondary | ICD-10-CM

## 2015-03-21 DIAGNOSIS — I25118 Atherosclerotic heart disease of native coronary artery with other forms of angina pectoris: Secondary | ICD-10-CM

## 2015-03-21 DIAGNOSIS — I208 Other forms of angina pectoris: Secondary | ICD-10-CM | POA: Diagnosis not present

## 2015-03-21 LAB — CUP PACEART INCLINIC DEVICE CHECK
Battery Impedance: 1204 Ohm
Battery Voltage: 2.76 V
Brady Statistic AP VS Percent: 86 %
Brady Statistic AS VP Percent: 0 %
Brady Statistic AS VS Percent: 14 %
Date Time Interrogation Session: 20161007125902
Lead Channel Impedance Value: 1104 Ohm
Lead Channel Impedance Value: 148 Ohm
Lead Channel Pacing Threshold Amplitude: 0.75 V
Lead Channel Pacing Threshold Amplitude: 1 V
Lead Channel Pacing Threshold Pulse Width: 0.76 ms
Lead Channel Sensing Intrinsic Amplitude: 0.7 mV
Lead Channel Sensing Intrinsic Amplitude: 15.67 mV
Lead Channel Setting Pacing Pulse Width: 0.76 ms
MDC IDC MSMT BATTERY REMAINING LONGEVITY: 47 mo
MDC IDC MSMT LEADCHNL RA PACING THRESHOLD PULSEWIDTH: 0.4 ms
MDC IDC SET LEADCHNL RA PACING AMPLITUDE: 2 V
MDC IDC SET LEADCHNL RV PACING AMPLITUDE: 2.5 V
MDC IDC SET LEADCHNL RV SENSING SENSITIVITY: 5.6 mV
MDC IDC STAT BRADY AP VP PERCENT: 0 %

## 2015-03-21 NOTE — Patient Instructions (Addendum)
Your physician recommends that you continue on your current medications as directed. Please refer to the Current Medication list given to you today. Your physician has requested that you have an echocardiogram. Echocardiography is a painless test that uses sound waves to create images of your heart. It provides your doctor with information about the size and shape of your heart and how well your heart's chambers and valves are working. This procedure takes approximately one hour. There are no restrictions for this procedure. Device check 06/23/15. Your physician recommends that you schedule a follow-up appointment in: 1 year with Dr. Rayann Heman. You will receive a reminder letter in the mail in about 10 months reminding you to call and schedule your appointment. If you don't receive this letter, please contact our office. You may also schedule this appointment today.

## 2015-03-21 NOTE — Progress Notes (Signed)
PCP:  Gar Ponto, MD Primary Cardiologist:  Dr Domenic Polite  The patient presents today for routine electrophysiology followup.  Since last being seen in our clinic, the patient reports doing reasonably well.  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  He underwent PTCA 7/16 and feels "much better".  Today, he denies symptoms of palpitations, chest pain, shortness of breath, orthopnea, dizziness, presyncope, syncope, or neurologic sequela.  The patient feels that he is tolerating medications without difficulties and is otherwise without complaint today.   Past Medical History  Diagnosis Date  . History of TIAs   . Mixed hyperlipidemia   . Sinus node dysfunction [facility])     Medtronic pacemaker - Dr. Rayann Heman  . CAD (coronary artery disease), native coronary artery     a. DES to proximal, mid, and distal RCA 2006. b. 12/2014 - [place] s/p  PTCA to the mid-distal previously placed RCA stents, otherwise nonobstructive CAD for continued medical therapy.  . 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 Prostate cancer metastatic to bone ([place])   . Anemia    Past Surgical History  Procedure Laterality Date  . Pacemaker insertion      MDT  . Cardiac catheterization N/A 01/06/2015    Procedure: Left Heart Cath and Coronary Angiography;  Surgeon: Troy Sine, MD;  Location: [facility];  Service: Cardiovascular;  Laterality: N/A;    Current Outpatient Prescriptions  Medication Sig Dispense Refill  . aspirin EC 81 MG EC tablet Take 1 tablet (81 mg total) by mouth daily. 30 tablet 11  . atenolol (TENORMIN) 50 MG tablet Take 12.5 mg by mouth daily. Take 1/4 tablet every day    . Calcium Carbonate-Vitamin D (CALCIUM-VITAMIN D) 500-200 MG-UNIT per tablet Take 1 tablet by mouth daily.    . clonazePAM (KLONOPIN) 1 MG tablet Take 1 mg by mouth at bedtime.     Marland Kitchen denosumab  (XGEVA) 120 MG/1.7ML SOLN injection Inject 120 mg into the skin every 6 (six) weeks.    . fentaNYL (DURAGESIC - DOSED MCG/HR) 25 MCG/HR Place 1 patch onto the skin every 3 (three) days.    Marland Kitchen leuprolide (LUPRON) 22.5 MG injection Inject 22.5 mg into the muscle every 3 (three) months.    . levothyroxine (SYNTHROID, LEVOTHROID) 50 MCG tablet Take 1 tablet by mouth daily.    . nitroGLYCERIN (NITROSTAT) 0.4 MG SL tablet Place 1 tablet (0.4 mg total) under the tongue every 5 (five) minutes as needed for chest pain. 25 tablet 3  . oxyCODONE-acetaminophen (PERCOCET) 10-325 MG per tablet Take 1 tablet by mouth daily.     . pantoprazole (PROTONIX) 40 MG tablet Take 40 mg by mouth daily.    . polyethylene glycol (MIRALAX / GLYCOLAX) packet Take 17 g by mouth daily.    Marland Kitchen senna-docusate (EQ SENNA-S) 8.6-50 MG per tablet Take 2 tablets by mouth daily. 60 tablet 3  . simvastatin (ZOCOR) 40 MG tablet Take 40 mg by mouth at bedtime.      . ticagrelor (BRILINTA) 90 MG TABS tablet Take 1 tablet (90 mg total) by mouth 2 (two) times daily. 60 tablet 11  . venlafaxine XR (EFFEXOR-XR) 150 MG 24 hr capsule Take 150 mg by mouth daily.    . vitamin B-12 (CYANOCOBALAMIN) 1000 MCG tablet Take 1,000 mcg by mouth daily.       No current  facility-administered medications for this visit.    Allergies  Allergen Reactions  . Penicillins Other (See Comments)    Passed out  . Phenazopyridine Hcl Hives  . Ramipril Hives    Social History   Social History  . Marital Status: Married    Spouse Name: N/A  . Number of Children: N/A  . Years of Education: N/A   Occupational History  . Not on file.   Social History Main Topics  . Smoking status: Former Smoker -- 1.00 packs/day for 5 years    Types: Cigarettes    Start date: 09/15/1951    Quit date: 06/14/1965  . Smokeless tobacco: Former Systems developer    Types: [place] date: 06/14/1978     Comment: chewed tobacco 5 years 1PPD  . Alcohol Use: No  . Drug Use: No  .  Sexual Activity: Not on file   Other Topics Concern  . Not on file   Social History Narrative    Physical Exam: Filed Vitals:   03/21/15 1051  BP: 110/72  Pulse: 63  Height: 5\' 9"  (1.753 m)  Weight: 134 lb 12.8 oz (61.145 kg)  SpO2: 98%    GEN- The patient is well appearing, alert and oriented x 3 today.   Head- normocephalic, atraumatic Eyes-  Sclera clear, conjunctiva pink Ears- hearing intact Oropharynx- clear Neck- supple, no JVP Lungs- Clear to ausculation bilaterally, normal work of breathing Chest- pacemaker pocket is well healed Heart- Regular rate and rhythm, no murmurs, rubs or gallops, PMI not laterally displaced GI- soft, NT, ND, + BS Extremities- no clubbing, cyanosis, or edema  Pacemaker interrogation- reviewed in detail today,  See PACEART report  Assessment and Plan:  1. Sick sinus syndrome Atrial lead impedance is chronically low.  Battery status remains good. No changes at this time  2. CAD S/p recent PTCA Will update echo prior to follow-up with Dr Domenic Polite  Return to the device clinic in 1year [facility] every 3 months Follow-up with Dr Domenic Polite as scheduled in November

## 2015-03-28 ENCOUNTER — Encounter: Payer: Medicare Other | Admitting: Internal Medicine

## 2015-04-03 ENCOUNTER — Other Ambulatory Visit (HOSPITAL_BASED_OUTPATIENT_CLINIC_OR_DEPARTMENT_OTHER): Payer: Medicare Other

## 2015-04-03 ENCOUNTER — Ambulatory Visit (HOSPITAL_BASED_OUTPATIENT_CLINIC_OR_DEPARTMENT_OTHER): Payer: Medicare Other

## 2015-04-03 VITALS — BP 128/67 | HR 64 | Temp 98.4°F

## 2015-04-03 DIAGNOSIS — C61 Malignant neoplasm of prostate: Secondary | ICD-10-CM

## 2015-04-03 DIAGNOSIS — C7951 Secondary malignant neoplasm of bone: Secondary | ICD-10-CM

## 2015-04-03 LAB — COMPREHENSIVE METABOLIC PANEL (CC13)
ALT: 24 U/L (ref 0–55)
AST: 20 U/L (ref 5–34)
Albumin: 3.9 g/dL (ref 3.5–5.0)
Alkaline Phosphatase: 16 U/L — ABNORMAL LOW (ref 40–150)
Anion Gap: 7 mEq/L (ref 3–11)
BUN: 12.4 mg/dL (ref 7.0–26.0)
CO2: 29 mEq/L (ref 22–29)
Calcium: 9.7 mg/dL (ref 8.4–10.4)
Chloride: 107 mEq/L (ref 98–109)
Creatinine: 0.9 mg/dL (ref 0.7–1.3)
EGFR: 81 mL/min/{1.73_m2} — ABNORMAL LOW (ref >90)
GLUCOSE: 82 mg/dL (ref 70–140)
Potassium: 4.3 mEq/L (ref 3.5–5.1)
Sodium: 143 mEq/L (ref 136–145)
Total Bilirubin: 0.3 mg/dL (ref 0.20–1.20)
Total Protein: 6.4 g/dL (ref 6.4–8.3)

## 2015-04-03 LAB — CBC WITH DIFFERENTIAL/PLATELET
BASO%: 0.2 % (ref 0.0–2.0)
Basophils Absolute: 0 10*3/uL (ref 0.0–0.1)
EOS%: 3.4 % (ref 0.0–7.0)
Eosinophils Absolute: 0.2 10*3/uL (ref 0.0–0.5)
HCT: 36.6 % — ABNORMAL LOW (ref 38.4–49.9)
HGB: 12 g/dL — ABNORMAL LOW (ref 13.0–17.1)
LYMPH#: 1.5 10*3/uL (ref 0.9–3.3)
LYMPH%: 25.6 % (ref 14.0–49.0)
MCH: 29.6 pg (ref 27.2–33.4)
MCHC: 32.8 g/dL (ref 32.0–36.0)
MCV: 90.1 fL (ref 79.3–98.0)
MONO#: 0.6 10*3/uL (ref 0.1–0.9)
MONO%: 11.1 % (ref 0.0–14.0)
NEUT#: 3.4 10*3/uL (ref 1.5–6.5)
NEUT%: 59.7 % (ref 39.0–75.0)
Platelets: 173 10*3/uL (ref 140–400)
RBC: 4.06 10*6/uL — ABNORMAL LOW (ref 4.20–5.82)
RDW: 12.9 % (ref 11.0–14.6)
WBC: 5.7 10*3/uL (ref 4.0–10.3)

## 2015-04-03 MED ORDER — DENOSUMAB 120 MG/1.7ML ~~LOC~~ SOLN
120.0000 mg | Freq: Once | SUBCUTANEOUS | Status: AC
  Administered 2015-04-03: 120 mg via SUBCUTANEOUS
  Filled 2015-04-03: qty 1.7

## 2015-04-04 LAB — PSA: PSA: 0.11 ng/mL (ref <=4.00)

## 2015-04-07 ENCOUNTER — Encounter: Payer: Self-pay | Admitting: Internal Medicine

## 2015-04-09 ENCOUNTER — Ambulatory Visit (INDEPENDENT_AMBULATORY_CARE_PROVIDER_SITE_OTHER): Payer: Medicare Other

## 2015-04-09 ENCOUNTER — Other Ambulatory Visit: Payer: Self-pay

## 2015-04-09 DIAGNOSIS — I2510 Atherosclerotic heart disease of native coronary artery without angina pectoris: Secondary | ICD-10-CM

## 2015-04-10 DIAGNOSIS — D630 Anemia in neoplastic disease: Secondary | ICD-10-CM | POA: Diagnosis not present

## 2015-04-10 DIAGNOSIS — I25111 Atherosclerotic heart disease of native coronary artery with angina pectoris with documented spasm: Secondary | ICD-10-CM | POA: Diagnosis not present

## 2015-04-10 DIAGNOSIS — G2581 Restless legs syndrome: Secondary | ICD-10-CM | POA: Diagnosis not present

## 2015-04-10 DIAGNOSIS — C61 Malignant neoplasm of prostate: Secondary | ICD-10-CM | POA: Diagnosis not present

## 2015-04-10 DIAGNOSIS — E782 Mixed hyperlipidemia: Secondary | ICD-10-CM | POA: Diagnosis not present

## 2015-04-10 DIAGNOSIS — I10 Essential (primary) hypertension: Secondary | ICD-10-CM | POA: Diagnosis not present

## 2015-04-10 DIAGNOSIS — K219 Gastro-esophageal reflux disease without esophagitis: Secondary | ICD-10-CM | POA: Diagnosis not present

## 2015-04-10 DIAGNOSIS — F331 Major depressive disorder, recurrent, moderate: Secondary | ICD-10-CM | POA: Diagnosis not present

## 2015-04-11 ENCOUNTER — Telehealth: Payer: Self-pay | Admitting: Oncology

## 2015-04-11 ENCOUNTER — Ambulatory Visit (HOSPITAL_BASED_OUTPATIENT_CLINIC_OR_DEPARTMENT_OTHER): Payer: Medicare Other | Admitting: Oncology

## 2015-04-11 ENCOUNTER — Ambulatory Visit (HOSPITAL_BASED_OUTPATIENT_CLINIC_OR_DEPARTMENT_OTHER): Payer: Medicare Other

## 2015-04-11 ENCOUNTER — Other Ambulatory Visit: Payer: Medicare Other

## 2015-04-11 VITALS — BP 115/58 | HR 86 | Temp 97.6°F | Resp 16 | Ht 69.0 in | Wt 132.7 lb

## 2015-04-11 DIAGNOSIS — C7951 Secondary malignant neoplasm of bone: Secondary | ICD-10-CM | POA: Diagnosis not present

## 2015-04-11 DIAGNOSIS — C61 Malignant neoplasm of prostate: Secondary | ICD-10-CM

## 2015-04-11 DIAGNOSIS — G893 Neoplasm related pain (acute) (chronic): Secondary | ICD-10-CM

## 2015-04-11 DIAGNOSIS — Z5111 Encounter for antineoplastic chemotherapy: Secondary | ICD-10-CM

## 2015-04-11 MED ORDER — LEUPROLIDE ACETATE (3 MONTH) 22.5 MG IM KIT
22.5000 mg | PACK | Freq: Once | INTRAMUSCULAR | Status: AC
  Administered 2015-04-11: 22.5 mg via INTRAMUSCULAR
  Filled 2015-04-11: qty 22.5

## 2015-04-11 NOTE — Progress Notes (Signed)
Hematology and Oncology Follow Up Visit  Russell Browning [id number] 09/21/1940 74 y.o. 04/11/2015 9:10 AM    Principle Diagnosis: This is a pleasant 74 year old gentleman with  advanced prostate cancer. He has metastatic disease to the bone. It appears to be hormone sensitive at this time. His cancer was inially diagnosed in 2000.    Prior Therapy: 1. He received radiation therapy and 2 years of Lupron, but it presumably was locally advanced prostate cancer at the time of diagnosis in 2000.  2. PSA was under control from 2001 up until the early parts of 2012, where his PSA rose up to 16.9. At that time, he was under the care of Dr. 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 He subsequently started on  Lupron every 3 months.  3. He is S/P radiation therapy directed to the right hip (30 gray in 12 fractions) concluded in 07/2011 for palliative purposes conducted in [place].    Current therapy:  1. Lupron 22.5 mg every three months.  2. Delton See given every 3 months started in 10/2011.   Interim History:  Russell Browning presents today for a follow up visit. Since his last visit, he continues to do very well. His appetite remains excellent and have now been able to gain weight however. He drinks boost twice a day and reports no issues with it.  He did have unstable angina and subsequently underwent PTCA and his right coronary artery in July 2016 and is doing well from a cardiovascular standpoint. Continues to be active and performs activities of daily living. Has not reported any major complications related to Lupron. He does report hot flashes that have been tolerable at this time.  He does not report any headaches, blurry vision, syncope or seizures. He does not report any fevers or chills or sweats. Is not reporting any chest pain or difficulty breathing. Has not reported any nausea or vomiting. Has not reported any  palpitation or leg edema. He is not reporting any change in his bowel habits. Has not reported any urinary symptoms. Remainder of his review of systems unremarkable.   Medications: I have reviewed the patient's current medications.   Current Outpatient Prescriptions  Medication Sig Dispense Refill  . aspirin EC 81 MG EC tablet Take 1 tablet (81 mg total) by mouth daily. 30 tablet 11  . atenolol (TENORMIN) 50 MG tablet Take 12.5 mg by mouth daily. Take 1/4 tablet every day    . Calcium Carbonate-Vitamin D (CALCIUM-VITAMIN D) 500-200 MG-UNIT per tablet Take 1 tablet by mouth daily.    . clonazePAM (KLONOPIN) 1 MG tablet Take 1 mg by mouth at bedtime.     Marland Kitchen denosumab (XGEVA) 120 MG/1.7ML SOLN injection Inject 120 mg into the skin every 6 (six) weeks.    . fentaNYL (DURAGESIC - DOSED MCG/HR) 25 MCG/HR Place 1 patch onto the skin every 3 (three) days.    Marland Kitchen leuprolide (LUPRON) 22.5 MG injection Inject 22.5 mg into the muscle every 3 (three) months.    . levothyroxine (SYNTHROID, LEVOTHROID) 50 MCG tablet Take 1 tablet by mouth daily.    . nitroGLYCERIN (NITROSTAT) 0.4 MG SL tablet Place 1 tablet (0.4 mg total) under the tongue every 5 (five) minutes as needed for chest pain. 25 tablet 3  . oxyCODONE-acetaminophen (PERCOCET) 10-325 MG per tablet Take 1 tablet by mouth daily.     . pantoprazole (PROTONIX)  40 MG tablet Take 40 mg by mouth daily.    . polyethylene glycol (MIRALAX / GLYCOLAX) packet Take 17 g by mouth daily.    Marland Kitchen senna-docusate (EQ SENNA-S) 8.6-50 MG per tablet Take 2 tablets by mouth daily. 60 tablet 3  . simvastatin (ZOCOR) 40 MG tablet Take 40 mg by mouth at bedtime.      . ticagrelor (BRILINTA) 90 MG TABS tablet Take 1 tablet (90 mg total) by mouth 2 (two) times daily. 60 tablet 11  . venlafaxine XR (EFFEXOR-XR) 150 MG 24 hr capsule Take 150 mg by mouth daily.    . vitamin B-12 (CYANOCOBALAMIN) 1000 MCG tablet Take 1,000 mcg by mouth daily.       No current facility-administered  medications for this visit.     Allergies:  Allergies  Allergen Reactions  . Penicillins Other (See Comments)    Passed out  . Phenazopyridine Hcl Hives  . Ramipril Hives    Past Medical History, Surgical history, Social history, and Family History were reviewed and updated.   Physical Exam: Blood pressure 115/58, pulse 86, temperature 97.6 F (36.4 C), temperature source Oral, resp. rate 16, height 5\' 9"  (1.753 m), weight 132 lb 11.2 oz (60.192 kg), SpO2 98 %. ECOG: 1 General appearance: alert awake without any distress. Head: Normocephalic, without obvious abnormality no oral ulcers or lesions. Neck: no adenopathy Lymph nodes: Cervical, supraclavicular, and axillary nodes normal. Heart:regular rate and rhythm, S1, S2 normal, no murmur, click, rub or gallop Lung:chest clear, no wheezing, rales, normal symmetric air entry Abdomin: soft, non-tender, without masses or organomegaly  EXT:no erythema, induration, or nodules   Lab Results: Lab Results  Component Value Date   WBC 5.7 04/03/2015   HGB 12.0* 04/03/2015   HCT 36.6* 04/03/2015   MCV 90.1 04/03/2015   PLT 173 04/03/2015      Results for Russell Browning (MRN [id number]) as of 04/11/2015 09:00  Ref. Range 08/06/2014 07:51 12/25/2014 07:59 04/03/2015 07:40  PSA Latest Ref Range: <=4.00 ng/mL 0.07 0.07 0.11        Impression and Plan: 74 year old gentleman with the following issues:  1. Hormone sensitive prostate cancer he is currently on Lupron with advanced disease to the bone. His PSA is under excellent control but showed slight rise from 0.7 to 0.11. The rise is very small at this time and we'll continue to monitor it. Different hormonal agents can be added in the future if his PSA continues to rise.  2. Bone pain: His pain is under excellent control with fentanyl patch and Percocet. His pain is under excellent control.  3. Bone directed therapy: Delton See have caused him some occasional neuropathy. He is doing  better on the schedule without any complications. As continue to educate him about complications related to medication including osteonecrosis of the jaw and hypocalcemia.  4. Follow up in 3 months.    JQZESP,QZRAQ, MD 10/28/20169:10 AM

## 2015-04-11 NOTE — Telephone Encounter (Signed)
Gave and printed appt sched and avs for pt for Jan and Feb 2017 °

## 2015-04-16 ENCOUNTER — Telehealth: Payer: Self-pay | Admitting: *Deleted

## 2015-04-17 NOTE — Telephone Encounter (Signed)
Patient informed. 

## 2015-04-29 ENCOUNTER — Ambulatory Visit (INDEPENDENT_AMBULATORY_CARE_PROVIDER_SITE_OTHER): Payer: Medicare Other | Admitting: Cardiology

## 2015-04-29 ENCOUNTER — Encounter: Payer: Self-pay | Admitting: Cardiology

## 2015-04-29 VITALS — BP 100/62 | HR 81 | Ht 70.0 in | Wt 134.0 lb

## 2015-04-29 DIAGNOSIS — I351 Nonrheumatic aortic (valve) insufficiency: Secondary | ICD-10-CM

## 2015-04-29 DIAGNOSIS — I495 Sick sinus syndrome: Secondary | ICD-10-CM

## 2015-04-29 DIAGNOSIS — E782 Mixed hyperlipidemia: Secondary | ICD-10-CM | POA: Diagnosis not present

## 2015-04-29 DIAGNOSIS — I2510 Atherosclerotic heart disease of native coronary artery without angina pectoris: Secondary | ICD-10-CM | POA: Diagnosis not present

## 2015-04-29 DIAGNOSIS — I208 Other forms of angina pectoris: Secondary | ICD-10-CM

## 2015-04-29 NOTE — Progress Notes (Signed)
Cardiology Office Note  Date: 04/29/2015   ID: Russell Browning, DOB [birth year], MRN [id number]  PCP: Gar Ponto, MD  Primary Cardiologist: Rozann Lesches, MD   Chief Complaint  Patient presents with  . Coronary Artery Disease    History of Present Illness: Russell Browning is a 74 y.o. male last seen in August. He presents for a routine visit today. No angina symptoms on current medical therapy, no nitroglycerin requirement. He has been active doing some outside chores, yard cleanup in preparation for the winter. He reports NYHA class II dyspnea.  Interval follow-up with Dr. Rayann Heman noted in October for follow-up of Medtronic pacemaker. No adjustments made at that time. He does not report any dizziness or syncope.  He had follow-up lipid panel obtained back in August showing improved lipid control on Zocor. LDL down to 77. He reports no intolerances with Zocor.  Echocardiogram obtained in October showed stable LVEF 55-60% with mild to moderate aortic regurgitation, full results reviewed below. We discussed this today.  We reviewed his medications, he otherwise had no significant concerns. Based on Dr. Evette Georges cardiac catheterization note from this past summer, DAPT is anticipated indefinitely. Patient reports no issues obtaining Brilinta at this time. He has had no significant bleeding problems.    Past Medical History  Diagnosis Date  . History of TIAs   . Mixed hyperlipidemia   . Sinus node dysfunction [facility])     Medtronic pacemaker - Dr. Rayann Heman  . CAD (coronary artery disease), native coronary artery     a. DES to proximal, mid, and distal RCA 2006. b. 12/2014 - [place] s/p  PTCA to the mid-distal previously placed RCA stents, otherwise nonobstructive CAD for continued medical therapy.  . 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 Prostate cancer metastatic to bone  ([place])   . Anemia     Past Surgical History  Procedure Laterality Date  . Pacemaker insertion      MDT  . Cardiac catheterization N/A 01/06/2015    Procedure: Left Heart Cath and Coronary Angiography;  Surgeon: Troy Sine, MD;  Location: [facility];  Service: Cardiovascular;  Laterality: N/A;    Current Outpatient Prescriptions  Medication Sig Dispense Refill  . aspirin EC 81 MG EC tablet Take 1 tablet (81 mg total) by mouth daily. 30 tablet 11  . atenolol (TENORMIN) 50 MG tablet Take 12.5 mg by mouth daily. Take 1/4 tablet every day    . Calcium Carbonate-Vitamin D (CALCIUM-VITAMIN D) 500-200 MG-UNIT per tablet Take 1 tablet by mouth daily.    . clonazePAM (KLONOPIN) 1 MG tablet Take 1 mg by mouth at bedtime.     Marland Kitchen denosumab (XGEVA) 120 MG/1.7ML SOLN injection Inject 120 mg into the skin every 6 (six) weeks.    . fentaNYL (DURAGESIC - DOSED MCG/HR) 25 MCG/HR Place 1 patch onto the skin every 3 (three) days.    Marland Kitchen leuprolide (LUPRON) 22.5 MG injection Inject 22.5 mg into the muscle every 3 (three) months.    . levothyroxine (SYNTHROID, LEVOTHROID) 50 MCG tablet Take 1 tablet by mouth daily.    . nitroGLYCERIN (NITROSTAT) 0.4 MG SL tablet Place 1 tablet (0.4 mg total) under the tongue every 5 (five) minutes as needed for chest pain. 25 tablet 3  . oxyCODONE-acetaminophen (PERCOCET) 10-325 MG per tablet Take 1 tablet by mouth daily.     Marland Kitchen  pantoprazole (PROTONIX) 40 MG tablet Take 40 mg by mouth daily.    . polyethylene glycol (MIRALAX / GLYCOLAX) packet Take 17 g by mouth daily.    Marland Kitchen senna-docusate (EQ SENNA-S) 8.6-50 MG per tablet Take 2 tablets by mouth daily. 60 tablet 3  . simvastatin (ZOCOR) 40 MG tablet Take 40 mg by mouth at bedtime.      . ticagrelor (BRILINTA) 90 MG TABS tablet Take 1 tablet (90 mg total) by mouth 2 (two) times daily. 60 tablet 11  . venlafaxine XR (EFFEXOR-XR) 150 MG 24 hr capsule Take 150 mg by mouth daily.    . vitamin B-12 (CYANOCOBALAMIN) 1000 MCG  tablet Take 1,000 mcg by mouth daily.       No current facility-administered medications for this visit.    Allergies:  Penicillins; Phenazopyridine hcl; and Ramipril   Social History: The patient  reports that he quit smoking about 49 years ago. His smoking use included Cigarettes. He started smoking about 63 years ago. He has a 5 pack-year smoking history. He quit smokeless tobacco use about 36 years ago. His smokeless tobacco use included Chew. He reports that he does not drink alcohol or use illicit drugs.   ROS:  Please see the history of present illness. Otherwise, complete review of systems is positive for none.  All other systems are reviewed and negative.   Physical Exam: VS:  BP 100/62 mmHg  Pulse 81  Ht 5\' 10"  (1.778 m)  Wt 134 lb (60.782 kg)  BMI 19.23 kg/m2  SpO2 96%, BMI Body mass index is 19.23 kg/(m^2).  Wt Readings from Last 3 Encounters:  04/29/15 134 lb (60.782 kg)  04/11/15 132 lb 11.2 oz (60.192 kg)  03/21/15 134 lb 12.8 oz (61.145 kg)     Patient appears comfortable at rest. HEENT: Conjunctiva and lids normal, oropharynx clear. Neck: Supple, no elevated JVP or carotid bruits, no thyromegaly. Thorax: Device pocket site well-healed. Lungs: Clear to auscultation, nonlabored breathing at rest. Cardiac: Regular rate and rhythm, no S3, soft systolic murmur. Abdomen: Soft, nontender, bowel sounds present, no guarding or rebound. Extremities: No pitting edema, distal pulses 2+. Skin: Warm and dry. Musculoskeletal: No kyphosis. Neuropsychiatric: Alert and oriented x3, affect grossly appropriate.   ECG: Tracing from 01/07/2015 showed an atrial paced rhythm with increased voltage.   Recent Labwork: 01/03/2015: TSH 2.353 04/03/2015: ALT 24; AST 20; BUN 12.4; Creatinine 0.9; HGB 12.0*; Platelets 173; Potassium 4.3; Sodium 143     Component Value Date/Time   CHOL 171 01/04/2015 0431   TRIG 106 01/04/2015 0431   HDL 42 01/04/2015 0431   CHOLHDL 4.1 01/04/2015  0431   VLDL 21 01/04/2015 0431   LDLCALC 108* 01/04/2015 0431  02/07/2015: Cholesterol 144, triglycerides 124, HDL 42, LDL 77  Other Studies Reviewed Today:  Echocardiogram 04/09/2015: Study Conclusions  - Left ventricle: The cavity size was normal. Wall thickness was normal. Systolic function was normal. The estimated ejection fraction was in the range of 55% to 60%. Indeterminate diastolic function. Wall motion was normal; there were no regional wall motion abnormalities. - Aortic valve: Mildly calcified annulus. Mildly thickened leaflets. There was mild to moderate regurgitation. The AI VC is 0.3 cm. Valve area (VTI): 3.02 cm^2. Valve area (Vmax): 2.89 cm^2. Valve area (Vmean): 2.98 cm^2. Regurgitation pressure half-time: 568 ms. - Mitral valve: There was mild regurgitation. - Left atrium: The atrium was moderately dilated. - Systemic veins: IVC is small suggesting low RA pressure and hypovolemia. - Technically difficult study.  Assessment and Plan:  1. Symptomatically stable CAD, most recent intervention being PTCA to the mid to distal RCA in July 2016 at the site of prior DES placement. He is doing well on current medical regimen, plan for indefinite DAPT as per Dr. Evette Georges cardiac catheterization note. Recent echocardiogram documents normal LVEF at 55-60%.  2. Mild to moderate aortic regurgitation, asymptomatic.  3. Sinus node dysfunction status post Medtronic pacemaker placement, followed by Dr. Rayann Heman.  4. Mixed hyperlipidemia, tolerating Zocor, LDL down to 77. No changes made today.  Current medicines were reviewed with the patient today.  Disposition: FU with me in 6 months.   Signed, Satira Sark, MD, [facility] 04/29/2015 8:36 AM    [facility], [place], [place] [address] Phone: [phone]; Fax: [phone]

## 2015-04-29 NOTE — Patient Instructions (Signed)
Your physician recommends that you continue on your current medications as directed. Please refer to the Current Medication list given to you today. Your physician recommends that you schedule a follow-up appointment in: 6 months. You will receive a reminder letter in the mail in about 4 months reminding you to call and schedule your appointment. If you don't receive this letter, please contact our office. 

## 2015-06-20 DIAGNOSIS — J0101 Acute recurrent maxillary sinusitis: Secondary | ICD-10-CM | POA: Diagnosis not present

## 2015-06-23 ENCOUNTER — Telehealth: Payer: Self-pay | Admitting: Cardiology

## 2015-06-23 ENCOUNTER — Ambulatory Visit (INDEPENDENT_AMBULATORY_CARE_PROVIDER_SITE_OTHER): Payer: Medicare Other | Admitting: *Deleted

## 2015-06-23 DIAGNOSIS — I495 Sick sinus syndrome: Secondary | ICD-10-CM

## 2015-06-23 NOTE — Telephone Encounter (Signed)
Spoke with pt and reminded pt of remote transmission that is due today. Pt verbalized understanding.   

## 2015-06-24 NOTE — Progress Notes (Signed)
Remote pacemaker transmission.   

## 2015-07-03 LAB — CUP PACEART REMOTE DEVICE CHECK
Battery Impedance: 1341 Ohm
Battery Remaining Longevity: 43 mo
Brady Statistic AP VP Percent: 0 %
Brady Statistic AP VS Percent: 82 %
Brady Statistic AS VP Percent: 0 %
Date Time Interrogation Session: 20170109193527
Implantable Lead Implant Date: 19990408
Implantable Lead Location: 753859
Implantable Lead Location: 753860
Implantable Lead Model: 4035
Implantable Lead Model: 4524
Implantable Lead Serial Number: 208202
Lead Channel Impedance Value: 1174 Ohm
Lead Channel Setting Pacing Amplitude: 2 V
Lead Channel Setting Pacing Amplitude: 2.5 V
Lead Channel Setting Pacing Pulse Width: 0.76 ms
Lead Channel Setting Sensing Sensitivity: 5.6 mV
MDC IDC LEAD IMPLANT DT: 19990408
MDC IDC MSMT BATTERY VOLTAGE: 2.77 V
MDC IDC MSMT LEADCHNL RA IMPEDANCE VALUE: 149 Ohm
MDC IDC STAT BRADY AS VS PERCENT: 18 %

## 2015-07-11 ENCOUNTER — Ambulatory Visit (HOSPITAL_BASED_OUTPATIENT_CLINIC_OR_DEPARTMENT_OTHER): Payer: Medicare Other

## 2015-07-11 ENCOUNTER — Encounter: Payer: Self-pay | Admitting: Cardiology

## 2015-07-11 ENCOUNTER — Other Ambulatory Visit (HOSPITAL_BASED_OUTPATIENT_CLINIC_OR_DEPARTMENT_OTHER): Payer: Medicare Other

## 2015-07-11 DIAGNOSIS — C7951 Secondary malignant neoplasm of bone: Secondary | ICD-10-CM | POA: Diagnosis not present

## 2015-07-11 DIAGNOSIS — C61 Malignant neoplasm of prostate: Secondary | ICD-10-CM

## 2015-07-11 LAB — CBC WITH DIFFERENTIAL/PLATELET
BASO%: 0.7 % (ref 0.0–2.0)
Basophils Absolute: 0 10*3/uL (ref 0.0–0.1)
EOS%: 3.6 % (ref 0.0–7.0)
Eosinophils Absolute: 0.2 10*3/uL (ref 0.0–0.5)
HCT: 34.5 % — ABNORMAL LOW (ref 38.4–49.9)
HGB: 11.4 g/dL — ABNORMAL LOW (ref 13.0–17.1)
LYMPH%: 26.7 % (ref 14.0–49.0)
MCH: 28.3 pg (ref 27.2–33.4)
MCHC: 33 g/dL (ref 32.0–36.0)
MCV: 85.8 fL (ref 79.3–98.0)
MONO#: 0.5 10*3/uL (ref 0.1–0.9)
MONO%: 9.5 % (ref 0.0–14.0)
NEUT#: 3 10*3/uL (ref 1.5–6.5)
NEUT%: 59.5 % (ref 39.0–75.0)
Platelets: 164 10*3/uL (ref 140–400)
RBC: 4.02 10*6/uL — ABNORMAL LOW (ref 4.20–5.82)
RDW: 14.4 % (ref 11.0–14.6)
WBC: 5.1 10*3/uL (ref 4.0–10.3)
lymph#: 1.4 10*3/uL (ref 0.9–3.3)

## 2015-07-11 LAB — COMPREHENSIVE METABOLIC PANEL
ALBUMIN: 3.9 g/dL (ref 3.5–5.0)
ALT: 16 U/L (ref 0–55)
ANION GAP: 7 meq/L (ref 3–11)
AST: 17 U/L (ref 5–34)
Alkaline Phosphatase: 18 U/L — ABNORMAL LOW (ref 40–150)
BILIRUBIN TOTAL: 0.35 mg/dL (ref 0.20–1.20)
BUN: 18.7 mg/dL (ref 7.0–26.0)
CO2: 28 meq/L (ref 22–29)
CREATININE: 0.9 mg/dL (ref 0.7–1.3)
Calcium: 9.3 mg/dL (ref 8.4–10.4)
Chloride: 106 mEq/L (ref 98–109)
EGFR: 80 mL/min/{1.73_m2} — ABNORMAL LOW (ref >90)
GLUCOSE: 74 mg/dL (ref 70–140)
Potassium: 4.1 mEq/L (ref 3.5–5.1)
SODIUM: 141 meq/L (ref 136–145)
TOTAL PROTEIN: 6.5 g/dL (ref 6.4–8.3)

## 2015-07-11 MED ORDER — DENOSUMAB 120 MG/1.7ML ~~LOC~~ SOLN
120.0000 mg | Freq: Once | SUBCUTANEOUS | Status: AC
  Administered 2015-07-11: 120 mg via SUBCUTANEOUS
  Filled 2015-07-11: qty 1.7

## 2015-07-12 LAB — PSA (PARALLEL TESTING): PSA: 0.17 ng/mL (ref <=4.00)

## 2015-07-12 LAB — PSA: Prostate Specific Ag, Serum: <0.1 ng/mL (ref 0.0–4.0)

## 2015-07-18 ENCOUNTER — Ambulatory Visit (HOSPITAL_BASED_OUTPATIENT_CLINIC_OR_DEPARTMENT_OTHER): Payer: Medicare Other | Admitting: Oncology

## 2015-07-18 ENCOUNTER — Ambulatory Visit (HOSPITAL_BASED_OUTPATIENT_CLINIC_OR_DEPARTMENT_OTHER): Payer: Medicare Other

## 2015-07-18 ENCOUNTER — Telehealth: Payer: Self-pay | Admitting: Oncology

## 2015-07-18 VITALS — BP 113/79 | HR 87 | Temp 98.4°F | Resp 18 | Wt 134.1 lb

## 2015-07-18 DIAGNOSIS — C7951 Secondary malignant neoplasm of bone: Secondary | ICD-10-CM | POA: Diagnosis not present

## 2015-07-18 DIAGNOSIS — C61 Malignant neoplasm of prostate: Secondary | ICD-10-CM

## 2015-07-18 DIAGNOSIS — M898X9 Other specified disorders of bone, unspecified site: Secondary | ICD-10-CM

## 2015-07-18 DIAGNOSIS — E291 Testicular hypofunction: Secondary | ICD-10-CM

## 2015-07-18 DIAGNOSIS — Z5111 Encounter for antineoplastic chemotherapy: Secondary | ICD-10-CM

## 2015-07-18 MED ORDER — LEUPROLIDE ACETATE (3 MONTH) 22.5 MG IM KIT
22.5000 mg | PACK | Freq: Once | INTRAMUSCULAR | Status: AC
  Administered 2015-07-18: 22.5 mg via INTRAMUSCULAR
  Filled 2015-07-18: qty 22.5

## 2015-07-18 NOTE — Progress Notes (Signed)
Hematology and Oncology Follow Up Visit  MARCHELLO ROHAL [id number] 05/25/1941 75 y.o. 07/18/2015 8:59 AM    Principle Diagnosis: This is a pleasant 75 year old gentleman with  advanced prostate cancer. He has metastatic disease to the bone. It appears to be hormone sensitive at this time. His cancer was inially diagnosed in 2000.    Prior Therapy: 1. He received radiation therapy and 2 years of Lupron, but it presumably was locally advanced prostate cancer at the time of diagnosis in 2000.  2. PSA was under control from 2001 up until the early parts of 2012, where his PSA rose up to 16.9. At that time, he was under the care of Dr. 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 He subsequently started on  Lupron every 3 months.  3. He is S/P radiation therapy directed to the right hip (30 gray in 12 fractions) concluded in 07/2011 for palliative purposes conducted in [place].    Current therapy:  1. Lupron 22.5 mg every three months.  2. Delton See given every 3 months started in 10/2011.   Interim History:  Mr. Gotte presents today for a follow up visit. Since his last visit, he reports no recent complaints. He does report some complications related to Xgeva injection which include fatigue, tiredness and flulike symptoms for 2 days. These complaints resolves rather spontaneously. He does not report any other side effects associated with these medications. He does not report any back pain or shoulder pain. Has not reported any pathological fractures. He continues to ambulate and attends to activities of daily living without any decline. His appetite remains excellent and have now been able to gain weight however. He drinks boost twice a day and reports no issues with it.    He does not report any headaches, blurry vision, syncope or seizures. He does not report any fevers or chills or sweats. Is not reporting any chest  pain or difficulty breathing. Has not reported any nausea or vomiting. Has not reported any palpitation or leg edema. He is not reporting any change in his bowel habits. Has not reported any urinary symptoms. Remainder of his review of systems unremarkable.   Medications: I have reviewed the patient's current medications.   Current Outpatient Prescriptions  Medication Sig Dispense Refill  . aspirin EC 81 MG EC tablet Take 1 tablet (81 mg total) by mouth daily. 30 tablet 11  . atenolol (TENORMIN) 50 MG tablet Take 12.5 mg by mouth daily. Take 1/4 tablet every day    . Calcium Carbonate-Vitamin D (CALCIUM-VITAMIN D) 500-200 MG-UNIT per tablet Take 1 tablet by mouth daily.    . clonazePAM (KLONOPIN) 1 MG tablet Take 1 mg by mouth at bedtime.     Marland Kitchen denosumab (XGEVA) 120 MG/1.7ML SOLN injection Inject 120 mg into the skin every 6 (six) weeks.    . fentaNYL (DURAGESIC - DOSED MCG/HR) 25 MCG/HR Place 1 patch onto the skin every 3 (three) days.    Marland Kitchen leuprolide (LUPRON) 22.5 MG injection Inject 22.5 mg into the muscle every 3 (three) months.    . levothyroxine (SYNTHROID, LEVOTHROID) 50 MCG tablet Take 1 tablet by mouth daily.    . nitroGLYCERIN (NITROSTAT) 0.4 MG SL tablet Place 1 tablet (0.4 mg total) under the tongue every 5 (five) minutes as needed for chest pain. 25 tablet 3  . oxyCODONE-acetaminophen (PERCOCET) 10-325 MG per tablet Take 1 tablet  by mouth daily.     . pantoprazole (PROTONIX) 40 MG tablet Take 40 mg by mouth daily.    . polyethylene glycol (MIRALAX / GLYCOLAX) packet Take 17 g by mouth daily.    Marland Kitchen senna-docusate (EQ SENNA-S) 8.6-50 MG per tablet Take 2 tablets by mouth daily. 60 tablet 3  . simvastatin (ZOCOR) 40 MG tablet Take 40 mg by mouth at bedtime.      . ticagrelor (BRILINTA) 90 MG TABS tablet Take 1 tablet (90 mg total) by mouth 2 (two) times daily. 60 tablet 11  . venlafaxine XR (EFFEXOR-XR) 150 MG 24 hr capsule Take 150 mg by mouth daily.    . vitamin B-12  (CYANOCOBALAMIN) 1000 MCG tablet Take 1,000 mcg by mouth daily.       No current facility-administered medications for this visit.     Allergies:  Allergies  Allergen Reactions  . Penicillins Other (See Comments)    Passed out  . Phenazopyridine Hcl Hives  . Ramipril Hives    Past Medical History, Surgical history, Social history, and Family History were reviewed and updated.   Physical Exam: Blood pressure 113/79, pulse 87, temperature 98.4 F (36.9 C), temperature source Oral, resp. rate 18, weight 134 lb 1.6 oz (60.827 kg), SpO2 100 %. ECOG: 1 General appearance: alert awake gentleman without distress. Head: Normocephalic, without obvious abnormality no oral thrush. Neck: no adenopathy Lymph nodes: Cervical, supraclavicular, and axillary nodes normal. Heart:regular rate and rhythm, S1, S2 normal, no murmur, click, rub or gallop Lung:chest clear, no wheezing, rales, normal symmetric air entry Abdomin: soft, non-tender, without masses or organomegaly  EXT:no erythema, induration, or nodules   Lab Results: Lab Results  Component Value Date   WBC 5.1 07/11/2015   HGB 11.4* 07/11/2015   HCT 34.5* 07/11/2015   MCV 85.8 07/11/2015   PLT 164 07/11/2015     Results for SHAWNEE, PETZOLD (MRN [id number]) as of 07/18/2015 08:30  Ref. Range 04/03/2015 07:40 07/11/2015 08:00  PSA Latest Ref Range: <=4.00 ng/mL 0.11 0.17        Impression and Plan: 75 year old gentleman with the following issues:  1. Hormone sensitive prostate cancer he is currently on Lupron with advanced disease to the bone. His PSA is under excellent control but showed slight rise from 0.7 to 0.17. The rise is very small at this time and we'll continue to monitor it.   One is to continue with Lupron only and add a second line therapy. If his PSA starts to rise rapidly.  2. Bone pain: His pain is under excellent control with fentanyl patch and Percocet. His pain is under excellent control. He has not  reported any recent exacerbation of his pain.  3. Bone directed therapy: Delton See have caused him some occasional neuropathy. He is doing better on the schedule without any complications. He did report some injection related issues but feel they are manageable elect to continue on the current schedule. This will be repeated in 3 months week before his Lupron injection.  4. Androgen depravation: He will continue Lupron every 3 months at 22.5 mg I will receive a week after his Xgeva.  5. Follow up in 3 months.    [facility], MD 2/3/20178:59 AM

## 2015-07-18 NOTE — Telephone Encounter (Signed)
per pf to sch pt appt-gave pt copy of avs °

## 2015-07-24 DIAGNOSIS — E782 Mixed hyperlipidemia: Secondary | ICD-10-CM | POA: Diagnosis not present

## 2015-07-24 DIAGNOSIS — D630 Anemia in neoplastic disease: Secondary | ICD-10-CM | POA: Diagnosis not present

## 2015-07-24 DIAGNOSIS — K210 Gastro-esophageal reflux disease with esophagitis: Secondary | ICD-10-CM | POA: Diagnosis not present

## 2015-07-24 DIAGNOSIS — I10 Essential (primary) hypertension: Secondary | ICD-10-CM | POA: Diagnosis not present

## 2015-07-31 DIAGNOSIS — M545 Low back pain: Secondary | ICD-10-CM | POA: Diagnosis not present

## 2015-07-31 DIAGNOSIS — F331 Major depressive disorder, recurrent, moderate: Secondary | ICD-10-CM | POA: Diagnosis not present

## 2015-07-31 DIAGNOSIS — I2510 Atherosclerotic heart disease of native coronary artery without angina pectoris: Secondary | ICD-10-CM | POA: Diagnosis not present

## 2015-07-31 DIAGNOSIS — G2581 Restless legs syndrome: Secondary | ICD-10-CM | POA: Diagnosis not present

## 2015-07-31 DIAGNOSIS — C61 Malignant neoplasm of prostate: Secondary | ICD-10-CM | POA: Diagnosis not present

## 2015-07-31 DIAGNOSIS — I10 Essential (primary) hypertension: Secondary | ICD-10-CM | POA: Diagnosis not present

## 2015-07-31 DIAGNOSIS — K219 Gastro-esophageal reflux disease without esophagitis: Secondary | ICD-10-CM | POA: Diagnosis not present

## 2015-07-31 DIAGNOSIS — E782 Mixed hyperlipidemia: Secondary | ICD-10-CM | POA: Diagnosis not present

## 2015-07-31 DIAGNOSIS — D630 Anemia in neoplastic disease: Secondary | ICD-10-CM | POA: Diagnosis not present

## 2015-08-08 DIAGNOSIS — H6501 Acute serous otitis media, right ear: Secondary | ICD-10-CM | POA: Diagnosis not present

## 2015-09-22 ENCOUNTER — Ambulatory Visit (INDEPENDENT_AMBULATORY_CARE_PROVIDER_SITE_OTHER): Payer: Medicare Other | Admitting: *Deleted

## 2015-09-22 ENCOUNTER — Telehealth: Payer: Self-pay | Admitting: Cardiology

## 2015-09-22 DIAGNOSIS — I495 Sick sinus syndrome: Secondary | ICD-10-CM | POA: Diagnosis not present

## 2015-09-22 NOTE — Telephone Encounter (Signed)
Spoke with pt and reminded pt of remote transmission that is due today. Pt verbalized understanding.   

## 2015-09-22 NOTE — Progress Notes (Signed)
Remote pacemaker transmission.   

## 2015-10-14 LAB — CUP PACEART REMOTE DEVICE CHECK
Battery Impedance: 1452 Ohm
Brady Statistic AP VP Percent: 0 %
Brady Statistic AP VS Percent: 84 %
Brady Statistic AS VP Percent: 0 %
Brady Statistic AS VS Percent: 16 %
Date Time Interrogation Session: 20170410150115
Implantable Lead Location: 753859
Implantable Lead Location: 753860
Implantable Lead Model: 4035
Implantable Lead Model: 4524
Implantable Lead Serial Number: 208202
Lead Channel Impedance Value: 1084 Ohm
Lead Channel Impedance Value: 147 Ohm
Lead Channel Sensing Intrinsic Amplitude: 8 mV
Lead Channel Setting Pacing Amplitude: 2.5 V
Lead Channel Setting Pacing Pulse Width: 0.76 ms
MDC IDC LEAD IMPLANT DT: 19990408
MDC IDC LEAD IMPLANT DT: 19990408
MDC IDC MSMT BATTERY REMAINING LONGEVITY: 40 mo
MDC IDC MSMT BATTERY VOLTAGE: 2.76 V
MDC IDC MSMT LEADCHNL RV PACING THRESHOLD AMPLITUDE: 1.5 V
MDC IDC MSMT LEADCHNL RV PACING THRESHOLD PULSEWIDTH: 0.4 ms
MDC IDC SET LEADCHNL RA PACING AMPLITUDE: 2 V
MDC IDC SET LEADCHNL RV SENSING SENSITIVITY: 5.6 mV

## 2015-10-15 ENCOUNTER — Other Ambulatory Visit: Payer: Medicare Other

## 2015-10-15 ENCOUNTER — Ambulatory Visit: Payer: Medicare Other

## 2015-10-20 ENCOUNTER — Telehealth: Payer: Self-pay | Admitting: Oncology

## 2015-10-20 NOTE — Telephone Encounter (Signed)
Returned call and s.w. Pt and advised to talk to md about when he needs to receive all of his injections

## 2015-10-21 ENCOUNTER — Telehealth: Payer: Self-pay | Admitting: Cardiology

## 2015-10-21 ENCOUNTER — Encounter: Payer: Self-pay | Admitting: Cardiology

## 2015-10-21 ENCOUNTER — Ambulatory Visit (INDEPENDENT_AMBULATORY_CARE_PROVIDER_SITE_OTHER): Payer: Medicare Other | Admitting: Cardiology

## 2015-10-21 VITALS — BP 110/62 | HR 82 | Ht 70.0 in | Wt 133.0 lb

## 2015-10-21 DIAGNOSIS — I351 Nonrheumatic aortic (valve) insufficiency: Secondary | ICD-10-CM

## 2015-10-21 DIAGNOSIS — I2510 Atherosclerotic heart disease of native coronary artery without angina pectoris: Secondary | ICD-10-CM | POA: Diagnosis not present

## 2015-10-21 DIAGNOSIS — Z950 Presence of cardiac pacemaker: Secondary | ICD-10-CM

## 2015-10-21 DIAGNOSIS — I495 Sick sinus syndrome: Secondary | ICD-10-CM | POA: Diagnosis not present

## 2015-10-21 DIAGNOSIS — E782 Mixed hyperlipidemia: Secondary | ICD-10-CM

## 2015-10-21 NOTE — Patient Instructions (Signed)
Your physician recommends that you continue on your current medications as directed. Please refer to the Current Medication list given to you today. Your physician recommends that you schedule a follow-up appointment in: 6 months. You will receive a reminder letter in the mail in about 4 months reminding you to call and schedule your appointment. If you don't receive this letter, please contact our office. 

## 2015-10-21 NOTE — Telephone Encounter (Signed)
-----   Message from Merlene Laughter, LPN sent at [phone]  8:38 AM EDT ----- Regarding: HOME MONITOR This patient said he is awaiting a callback from someone in the device clinic about his home monitor. Can you contact him about this?

## 2015-10-21 NOTE — Telephone Encounter (Signed)
Spoke w/ pt an he informed me that he never received his wirex adapter for his home monitor. Ordered pt a new wirex and verified mailing address. Pt verbalized understanding.

## 2015-10-21 NOTE — Progress Notes (Signed)
Cardiology Office Note  Date: 10/21/2015   ID: [name], DOB [birth year], MRN [id number]  PCP: Gar Ponto, MD  Primary Cardiologist: Rozann Lesches, MD   Chief Complaint  Patient presents with  . Coronary Artery Disease    History of Present Illness: Russell Browning is a 75 y.o. male last seen in November 2016. He presents for a routine follow-up visit. Since last encounter he has had no significant angina symptoms on medical therapy. 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 [place]  Device follow-up continues with Dr. Rayann Heman, Medtronic pacemaker in place. He reports no dizziness or syncope.  I reviewed his medications which are unchanged from a cardiac perspective. He continues on aspirin and Brilinta, does report easy bruising but no other major bleeding problems. DAPT was recommended indefinitely after his last cardiac catheterization, he had previously been on aspirin and Plavix.  He continues to follow with Dr. Quillian Quince for primary care.  Past Medical History  Diagnosis Date  . History of TIAs   . Mixed hyperlipidemia   . Sinus node dysfunction [facility])     Medtronic pacemaker - Dr. Rayann Heman  . CAD (coronary artery disease), native coronary artery     a. DES to proximal, mid, and distal RCA 2006. b. 12/2014 - [place] s/p  PTCA to the mid-distal previously placed RCA stents, otherwise nonobstructive CAD for continued medical therapy.  . 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 Prostate cancer metastatic to bone ([place])   . Anemia     Past Surgical History  Procedure Laterality Date  . Pacemaker insertion      MDT  . Cardiac catheterization N/A 01/06/2015    Procedure: Left Heart Cath and Coronary Angiography;  Surgeon: Troy Sine, MD;   Location: [facility];  Service: Cardiovascular;  Laterality: N/A;    Current Outpatient Prescriptions  Medication Sig Dispense Refill  . aspirin EC 81 MG EC tablet Take 1 tablet (81 mg total) by mouth daily. 30 tablet 11  . atenolol (TENORMIN) 50 MG tablet Take 12.5 mg by mouth daily. Take 1/4 tablet every day    . Calcium Carbonate-Vitamin D (CALCIUM-VITAMIN D) 500-200 MG-UNIT per tablet Take 1 tablet by mouth daily.    . clonazePAM (KLONOPIN) 1 MG tablet Take 1 mg by mouth at bedtime.     Marland Kitchen denosumab (XGEVA) 120 MG/1.7ML SOLN injection Inject 120 mg into the skin every 6 (six) weeks.    . fentaNYL (DURAGESIC - DOSED MCG/HR) 25 MCG/HR Place 1 patch onto the skin every 3 (three) days.    Marland Kitchen leuprolide (LUPRON) 22.5 MG injection Inject 22.5 mg into the muscle every 3 (three) months.    . levothyroxine (SYNTHROID, LEVOTHROID) 50 MCG tablet Take 1 tablet by mouth daily.    . nitroGLYCERIN (NITROSTAT) 0.4 MG SL tablet Place 1 tablet (0.4 mg total) under the tongue every 5 (five) minutes as needed for chest pain. 25 tablet 3  . oxyCODONE-acetaminophen (PERCOCET) 10-325 MG per tablet Take 1 tablet by mouth daily.     . pantoprazole (PROTONIX) 40 MG tablet Take 40 mg by mouth daily.    . polyethylene glycol (MIRALAX / GLYCOLAX) packet Take 17 g by mouth daily.    Marland Kitchen senna-docusate (EQ SENNA-S) 8.6-50 MG  per tablet Take 2 tablets by mouth daily. 60 tablet 3  . simvastatin (ZOCOR) 40 MG tablet Take 40 mg by mouth at bedtime.      . ticagrelor (BRILINTA) 90 MG TABS tablet Take 1 tablet (90 mg total) by mouth 2 (two) times daily. 60 tablet 11  . venlafaxine XR (EFFEXOR-XR) 150 MG 24 hr capsule Take 150 mg by mouth daily.    . vitamin B-12 (CYANOCOBALAMIN) 1000 MCG tablet Take 1,000 mcg by mouth daily.       No current facility-administered medications for this visit.   Allergies:  Penicillins; Phenazopyridine hcl; and Ramipril   Social History: The patient  reports that he quit smoking about 50  years ago. His smoking use included Cigarettes. He started smoking about 64 years ago. He has a 5 pack-year smoking history. He quit smokeless tobacco use about 37 years ago. His smokeless tobacco use included Chew. He reports that he does not drink alcohol or use illicit drugs.   ROS:  Please see the history of present illness. Otherwise, complete review of systems is positive for chronic leg pain.  All other systems are reviewed and negative.   Physical Exam: VS:  BP 110/62 mmHg  Pulse 82  Ht 5\' 10"  (1.778 m)  Wt 133 lb (60.328 kg)  BMI 19.08 kg/m2  SpO2 99%, BMI Body mass index is 19.08 kg/(m^2).  Wt Readings from Last 3 Encounters:  10/21/15 133 lb (60.328 kg)  07/18/15 134 lb 1.6 oz (60.827 kg)  04/29/15 134 lb (60.782 kg)    General: Thin male, appears comfortable at rest. HEENT: Conjunctiva and lids normal, oropharynx clear. Neck: Supple, no elevated JVP or carotid bruits, no thyromegaly. Lungs: Clear to auscultation, nonlabored breathing at rest. Cardiac: Regular rate and rhythm, no S3 or significant systolic murmur, no pericardial rub. Abdomen: Soft, nontender, bowel sounds present. Extremities: No pitting edema, distal pulses 2+. Skin: Warm and dry. Musculoskeletal: No kyphosis. Neuropsychiatric: Alert and oriented x3, affect grossly appropriate.  ECG: I personally reviewed the prior tracing from 01/07/2015 which showed an atrial paced rhythm with increased voltage.  Recent Labwork: 01/03/2015: TSH 2.353 07/11/2015: ALT 16; AST 17; BUN 18.7; Creatinine 0.9; HGB 11.4*; Platelets 164; Potassium 4.1; Sodium 141     Component Value Date/Time   CHOL 171 01/04/2015 0431   TRIG 106 01/04/2015 0431   HDL 42 01/04/2015 0431   CHOLHDL 4.1 01/04/2015 0431   VLDL 21 01/04/2015 0431   LDLCALC 108* 01/04/2015 0431    Other Studies Reviewed Today:  Echocardiogram 04/09/2015: Study Conclusions  - Left ventricle: The cavity size was normal. Wall thickness was  normal.  Systolic function was normal. The estimated ejection  fraction was in the range of 55% to 60%. Indeterminate diastolic  function. Wall motion was normal; there were no regional wall  motion abnormalities. - Aortic valve: Mildly calcified annulus. Mildly thickened  leaflets. There was mild to moderate regurgitation. The AI VC is  0.3 cm. Valve area (VTI): 3.02 cm^2. Valve area (Vmax): 2.89  cm^2. Valve area (Vmean): 2.98 cm^2. Regurgitation pressure  half-time: 568 ms. - Mitral valve: There was mild regurgitation. - Left atrium: The atrium was moderately dilated. - Systemic veins: IVC is small suggesting low RA pressure and  hypovolemia. - Technically difficult study.  Assessment and Plan:  1. Symptomatic stable CAD status post PTCA to the mid to distal RCA in July 2016 in the region of prior DES intervention. He continues on DAPT indefinitely at this point. LVEF  normal range. No changes made present regimen.  2. Mild to moderate, asymptomatic aortic regurgitation.  3. Sinus node dysfunction status post Medtronic pacemaker. Keep follow-up in the device clinic with Dr. Rayann Heman.  4. Hyperlipidemia, continues on Zocor without any intolerances. Keep follow-up with Dr. Quillian Quince for routine lab work.  Current medicines were reviewed with the patient today.  Disposition: FU with me in 6 months.   Signed, Satira Sark, MD, [facility] 10/21/2015 8:48 AM    [facility] [place]. [address] [place], [place] [address] Phone: [phone]; Fax: [phone]

## 2015-10-22 ENCOUNTER — Encounter: Payer: Self-pay | Admitting: Cardiology

## 2015-10-22 ENCOUNTER — Telehealth: Payer: Self-pay | Admitting: Oncology

## 2015-10-22 ENCOUNTER — Ambulatory Visit (HOSPITAL_BASED_OUTPATIENT_CLINIC_OR_DEPARTMENT_OTHER): Payer: Medicare Other

## 2015-10-22 ENCOUNTER — Ambulatory Visit (HOSPITAL_BASED_OUTPATIENT_CLINIC_OR_DEPARTMENT_OTHER): Payer: Medicare Other | Admitting: Oncology

## 2015-10-22 VITALS — BP 121/78 | HR 77 | Temp 98.2°F | Resp 18 | Ht 70.0 in | Wt 135.8 lb

## 2015-10-22 DIAGNOSIS — C61 Malignant neoplasm of prostate: Secondary | ICD-10-CM

## 2015-10-22 DIAGNOSIS — C7951 Secondary malignant neoplasm of bone: Secondary | ICD-10-CM | POA: Diagnosis not present

## 2015-10-22 DIAGNOSIS — E291 Testicular hypofunction: Secondary | ICD-10-CM

## 2015-10-22 DIAGNOSIS — Z5111 Encounter for antineoplastic chemotherapy: Secondary | ICD-10-CM

## 2015-10-22 DIAGNOSIS — M898X9 Other specified disorders of bone, unspecified site: Secondary | ICD-10-CM | POA: Diagnosis not present

## 2015-10-22 MED ORDER — LEUPROLIDE ACETATE (3 MONTH) 22.5 MG IM KIT
22.5000 mg | PACK | Freq: Once | INTRAMUSCULAR | Status: AC
  Administered 2015-10-22: 22.5 mg via INTRAMUSCULAR
  Filled 2015-10-22: qty 22.5

## 2015-10-22 NOTE — Telephone Encounter (Signed)
Gave and printed appt sched and avs for pt for May and Aug °

## 2015-10-22 NOTE — Progress Notes (Signed)
Hematology and Oncology Follow Up Visit  Russell Browning [id number] 04-Nov-1940 75 y.o. 10/22/2015 8:49 AM    Principle Diagnosis: This is a pleasant 75 year old gentleman with  advanced prostate cancer. He has metastatic disease to the bone. It appears to be hormone sensitive at this time. His cancer was inially diagnosed in 2000.    Prior Therapy: 1. He received radiation therapy and 2 years of Lupron, but it presumably was locally advanced prostate cancer at the time of diagnosis in 2000.  2. PSA was under control from 2001 up until the early parts of 2012, where his PSA rose up to 16.9. At that time, he was under the care of Dr. 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 He subsequently started on  Lupron every 3 months.  3. He is S/P radiation therapy directed to the right hip (30 gray in 12 fractions) concluded in 07/2011 for palliative purposes conducted in [place].    Current therapy:  1. Lupron 22.5 mg every three months.  2. Delton See given every 3 months started in 10/2011.   Interim History:  Russell Browning presents today for a follow up visit. Since his last visit, he continues to do well without any major changes in his health. 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 These symptoms resolved during the day. He did not report any motor neuropathy and no interference with his ability to ambulate. He does not report any side effects associated with these injections. He does not report any back pain or shoulder pain. Has not reported any pathological fractures. He continues to ambulate and attends to activities of daily living without any decline. His appetite remains at baseline was stable weight.   He does not report any headaches, blurry vision, syncope or seizures. He does not report any fevers or chills or sweats. He does not report any cough, wheezing or hemoptysis. Is  not reporting any chest pain or difficulty breathing. Does not report any dyspnea on exertion or leg edema. Has not reported any nausea or vomiting. Has not reported any palpitation or leg edema. He is not reporting any change in his bowel habits. Has not reported any urinary symptoms. Remainder of his review of systems unremarkable.   Medications: I have reviewed the patient's current medications.   Current Outpatient Prescriptions  Medication Sig Dispense Refill  . aspirin EC 81 MG EC tablet Take 1 tablet (81 mg total) by mouth daily. 30 tablet 11  . atenolol (TENORMIN) 50 MG tablet Take 12.5 mg by mouth daily. Take 1/4 tablet every day    . Calcium Carbonate-Vitamin D (CALCIUM-VITAMIN D) 500-200 MG-UNIT per tablet Take 1 tablet by mouth daily.    . clonazePAM (KLONOPIN) 1 MG tablet Take 1 mg by mouth at bedtime.     Marland Kitchen denosumab (XGEVA) 120 MG/1.7ML SOLN injection Inject 120 mg into the skin every 6 (six) weeks.    . fentaNYL (DURAGESIC - DOSED MCG/HR) 25 MCG/HR Place 1 patch onto the skin every 3 (three) days.    Marland Kitchen leuprolide (LUPRON) 22.5 MG injection Inject 22.5 mg into the muscle every 3 (three) months.    . levothyroxine (SYNTHROID, LEVOTHROID) 50 MCG tablet Take 1 tablet by mouth daily.    . nitroGLYCERIN (NITROSTAT) 0.4 MG SL tablet Place 1 tablet (0.4 mg total) under the tongue every 5 (five) minutes as needed for  chest pain. 25 tablet 3  . oxyCODONE-acetaminophen (PERCOCET) 10-325 MG per tablet Take 1 tablet by mouth daily.     . pantoprazole (PROTONIX) 40 MG tablet Take 40 mg by mouth daily.    . polyethylene glycol (MIRALAX / GLYCOLAX) packet Take 17 g by mouth daily.    Marland Kitchen senna-docusate (EQ SENNA-S) 8.6-50 MG per tablet Take 2 tablets by mouth daily. 60 tablet 3  . simvastatin (ZOCOR) 40 MG tablet Take 40 mg by mouth at bedtime.      . ticagrelor (BRILINTA) 90 MG TABS tablet Take 1 tablet (90 mg total) by mouth 2 (two) times daily. 60 tablet 11  . venlafaxine XR (EFFEXOR-XR) 150  MG 24 hr capsule Take 150 mg by mouth daily.    . vitamin B-12 (CYANOCOBALAMIN) 1000 MCG tablet Take 1,000 mcg by mouth daily.       No current facility-administered medications for this visit.     Allergies:  Allergies  Allergen Reactions  . Penicillins Other (See Comments)    Passed out  . Phenazopyridine Hcl Hives  . Ramipril Hives    Past Medical History, Surgical history, Social history, and Family History were reviewed and updated.   Physical Exam: Blood pressure 121/78, pulse 77, temperature 98.2 F (36.8 C), temperature source Oral, resp. rate 18, height 5\' 10"  (1.778 m), weight 135 lb 12.8 oz (61.598 kg), SpO2 100 %. ECOG: 1 General appearance: Well-appearing gentleman without distress. Head: Normocephalic, without obvious abnormality no oral thrush. Neck: no adenopathy Lymph nodes: Cervical, supraclavicular, and axillary nodes normal. Heart:regular rate and rhythm, S1, S2 normal, no murmur, click, rub or gallop Lung:chest clear, no wheezing, rales, normal symmetric air entry Abdomin: soft, non-tender, without masses or organomegaly no shifting dullness or ascites. EXT:no erythema, induration, or nodules   Lab Results: Lab Results  Component Value Date   WBC 5.1 07/11/2015   HGB 11.4* 07/11/2015   HCT 34.5* 07/11/2015   MCV 85.8 07/11/2015   PLT 164 07/11/2015     Results for Russell Browning (MRN [id number]) as of 07/18/2015 08:30  Ref. Range 04/03/2015 07:40 07/11/2015 08:00  PSA Latest Ref Range: <=4.00 ng/mL 0.11 0.17        Impression and Plan: 68- year old gentleman with the following issues:  1. Hormone sensitive prostate cancer he is currently on Lupron with advanced disease to the bone. His PSA Continues to be under reasonable control on hormone therapy alone. The plan is to continue with Lupron at this time and add second line hormone therapy his PSA starts to rise. He'll need staging workup if that happens.   2. Bone pain: His pain is controlled  at this time without any pain exacerbation.  3. Bone directed therapy: He is receiving this every 3 months for better tolerance. He is doing better on the schedule without any complications. He did report some injection related issues but feel they are manageable elect to continue on the current schedule. This will be repeated next week and in 3 months.  4. Androgen depravation: He will continue Lupron 22.5 mg every 3 months and this will be given today on 10/22/2015.  5. Follow up in 3 months.    [facility], MD 5/10/20178:49 AM

## 2015-10-29 ENCOUNTER — Other Ambulatory Visit (HOSPITAL_BASED_OUTPATIENT_CLINIC_OR_DEPARTMENT_OTHER): Payer: Medicare Other

## 2015-10-29 ENCOUNTER — Ambulatory Visit (HOSPITAL_BASED_OUTPATIENT_CLINIC_OR_DEPARTMENT_OTHER): Payer: Medicare Other

## 2015-10-29 VITALS — BP 127/74 | HR 88 | Temp 98.0°F

## 2015-10-29 DIAGNOSIS — C7951 Secondary malignant neoplasm of bone: Secondary | ICD-10-CM

## 2015-10-29 DIAGNOSIS — C61 Malignant neoplasm of prostate: Secondary | ICD-10-CM

## 2015-10-29 LAB — CBC WITH DIFFERENTIAL/PLATELET
BASO%: 0.4 % (ref 0.0–2.0)
BASOS ABS: 0 10*3/uL (ref 0.0–0.1)
EOS ABS: 0.2 10*3/uL (ref 0.0–0.5)
EOS%: 4.7 % (ref 0.0–7.0)
HEMATOCRIT: 35.3 % — AB (ref 38.4–49.9)
HGB: 11.7 g/dL — ABNORMAL LOW (ref 13.0–17.1)
LYMPH#: 1.6 10*3/uL (ref 0.9–3.3)
LYMPH%: 32 % (ref 14.0–49.0)
MCH: 29.6 pg (ref 27.2–33.4)
MCHC: 33.1 g/dL (ref 32.0–36.0)
MCV: 89.4 fL (ref 79.3–98.0)
MONO#: 0.5 10*3/uL (ref 0.1–0.9)
MONO%: 9.9 % (ref 0.0–14.0)
NEUT#: 2.6 10*3/uL (ref 1.5–6.5)
NEUT%: 53 % (ref 39.0–75.0)
PLATELETS: 136 10*3/uL — AB (ref 140–400)
RBC: 3.95 10*6/uL — ABNORMAL LOW (ref 4.20–5.82)
RDW: 13.9 % (ref 11.0–14.6)
WBC: 4.9 10*3/uL (ref 4.0–10.3)

## 2015-10-29 LAB — COMPREHENSIVE METABOLIC PANEL
ALBUMIN: 4 g/dL (ref 3.5–5.0)
ALK PHOS: 19 U/L — AB (ref 40–150)
ALT: 12 U/L (ref 0–55)
ANION GAP: 7 meq/L (ref 3–11)
AST: 15 U/L (ref 5–34)
BUN: 14.1 mg/dL (ref 7.0–26.0)
CALCIUM: 9.3 mg/dL (ref 8.4–10.4)
CHLORIDE: 105 meq/L (ref 98–109)
CO2: 29 mEq/L (ref 22–29)
Creatinine: 1.1 mg/dL (ref 0.7–1.3)
EGFR: 65 mL/min/{1.73_m2} — AB (ref >90)
Glucose: 104 mg/dl (ref 70–140)
POTASSIUM: 4.5 meq/L (ref 3.5–5.1)
Sodium: 142 mEq/L (ref 136–145)
Total Bilirubin: <0.3 mg/dL (ref 0.20–1.20)
Total Protein: 6.5 g/dL (ref 6.4–8.3)

## 2015-10-29 MED ORDER — DENOSUMAB 120 MG/1.7ML ~~LOC~~ SOLN
120.0000 mg | Freq: Once | SUBCUTANEOUS | Status: AC
  Administered 2015-10-29: 120 mg via SUBCUTANEOUS
  Filled 2015-10-29: qty 1.7

## 2015-10-30 LAB — PSA: Prostate Specific Ag, Serum: 0.1 ng/mL (ref 0.0–4.0)

## 2015-11-11 DIAGNOSIS — M545 Low back pain: Secondary | ICD-10-CM | POA: Diagnosis not present

## 2015-11-11 DIAGNOSIS — K219 Gastro-esophageal reflux disease without esophagitis: Secondary | ICD-10-CM | POA: Diagnosis not present

## 2015-11-11 DIAGNOSIS — C61 Malignant neoplasm of prostate: Secondary | ICD-10-CM | POA: Diagnosis not present

## 2015-11-11 DIAGNOSIS — E782 Mixed hyperlipidemia: Secondary | ICD-10-CM | POA: Diagnosis not present

## 2015-11-11 DIAGNOSIS — I10 Essential (primary) hypertension: Secondary | ICD-10-CM | POA: Diagnosis not present

## 2015-11-11 DIAGNOSIS — D630 Anemia in neoplastic disease: Secondary | ICD-10-CM | POA: Diagnosis not present

## 2015-11-11 DIAGNOSIS — G2581 Restless legs syndrome: Secondary | ICD-10-CM | POA: Diagnosis not present

## 2015-11-11 DIAGNOSIS — I2510 Atherosclerotic heart disease of native coronary artery without angina pectoris: Secondary | ICD-10-CM | POA: Diagnosis not present

## 2015-11-11 DIAGNOSIS — F331 Major depressive disorder, recurrent, moderate: Secondary | ICD-10-CM | POA: Diagnosis not present

## 2015-12-09 DIAGNOSIS — S5001XA Contusion of right elbow, initial encounter: Secondary | ICD-10-CM | POA: Diagnosis not present

## 2015-12-09 DIAGNOSIS — S60211A Contusion of right wrist, initial encounter: Secondary | ICD-10-CM | POA: Diagnosis not present

## 2015-12-22 ENCOUNTER — Encounter: Payer: Medicare Other | Admitting: *Deleted

## 2015-12-22 ENCOUNTER — Telehealth: Payer: Self-pay | Admitting: Cardiology

## 2015-12-22 NOTE — Telephone Encounter (Signed)
Spoke with pt and reminded pt of remote transmission that is due today. Pt verbalized understanding.   

## 2015-12-26 ENCOUNTER — Encounter: Payer: Self-pay | Admitting: Cardiology

## 2016-01-02 ENCOUNTER — Ambulatory Visit (INDEPENDENT_AMBULATORY_CARE_PROVIDER_SITE_OTHER): Payer: Medicare Other | Admitting: *Deleted

## 2016-01-02 DIAGNOSIS — I495 Sick sinus syndrome: Secondary | ICD-10-CM | POA: Diagnosis not present

## 2016-01-05 ENCOUNTER — Encounter: Payer: Self-pay | Admitting: Cardiology

## 2016-01-05 NOTE — Progress Notes (Signed)
Remote pacemaker transmission.   

## 2016-01-13 LAB — CUP PACEART REMOTE DEVICE CHECK
Brady Statistic AP VP Percent: 0 %
Brady Statistic AP VS Percent: 85 %
Brady Statistic AS VS Percent: 15 %
Date Time Interrogation Session: 20170721165951
Implantable Lead Implant Date: 19990408
Implantable Lead Location: 753860
Implantable Lead Model: 4524
Implantable Lead Serial Number: 208202
Lead Channel Impedance Value: 1160 Ohm
Lead Channel Impedance Value: 148 Ohm
Lead Channel Sensing Intrinsic Amplitude: 8 mV
Lead Channel Setting Pacing Amplitude: 2.5 V
MDC IDC LEAD IMPLANT DT: 19990408
MDC IDC LEAD LOCATION: 753859
MDC IDC LEAD MODEL: 4035
MDC IDC MSMT BATTERY IMPEDANCE: 1713 Ohm
MDC IDC MSMT BATTERY REMAINING LONGEVITY: 35 mo
MDC IDC MSMT BATTERY VOLTAGE: 2.76 V
MDC IDC MSMT LEADCHNL RV PACING THRESHOLD AMPLITUDE: 1.5 V
MDC IDC MSMT LEADCHNL RV PACING THRESHOLD PULSEWIDTH: 0.4 ms
MDC IDC SET LEADCHNL RA PACING AMPLITUDE: 2 V
MDC IDC SET LEADCHNL RV PACING PULSEWIDTH: 0.76 ms
MDC IDC SET LEADCHNL RV SENSING SENSITIVITY: 5.6 mV
MDC IDC STAT BRADY AS VP PERCENT: 0 %

## 2016-01-22 ENCOUNTER — Other Ambulatory Visit (HOSPITAL_BASED_OUTPATIENT_CLINIC_OR_DEPARTMENT_OTHER): Payer: Medicare Other

## 2016-01-22 ENCOUNTER — Ambulatory Visit (HOSPITAL_BASED_OUTPATIENT_CLINIC_OR_DEPARTMENT_OTHER): Payer: Medicare Other

## 2016-01-22 VITALS — BP 115/69 | HR 78 | Temp 97.9°F | Resp 20

## 2016-01-22 DIAGNOSIS — C61 Malignant neoplasm of prostate: Secondary | ICD-10-CM

## 2016-01-22 DIAGNOSIS — I259 Chronic ischemic heart disease, unspecified: Secondary | ICD-10-CM

## 2016-01-22 DIAGNOSIS — C7951 Secondary malignant neoplasm of bone: Secondary | ICD-10-CM

## 2016-01-22 DIAGNOSIS — T82110S Breakdown (mechanical) of cardiac electrode, sequela: Secondary | ICD-10-CM

## 2016-01-22 DIAGNOSIS — Z950 Presence of cardiac pacemaker: Secondary | ICD-10-CM

## 2016-01-22 DIAGNOSIS — I951 Orthostatic hypotension: Secondary | ICD-10-CM

## 2016-01-22 DIAGNOSIS — I495 Sick sinus syndrome: Secondary | ICD-10-CM

## 2016-01-22 DIAGNOSIS — G459 Transient cerebral ischemic attack, unspecified: Secondary | ICD-10-CM

## 2016-01-22 LAB — COMPREHENSIVE METABOLIC PANEL
ALK PHOS: 18 U/L — AB (ref 40–150)
ALT: 11 U/L (ref 0–55)
AST: 17 U/L (ref 5–34)
Albumin: 3.8 g/dL (ref 3.5–5.0)
Anion Gap: 8 mEq/L (ref 3–11)
BUN: 11.5 mg/dL (ref 7.0–26.0)
CALCIUM: 9.4 mg/dL (ref 8.4–10.4)
CO2: 29 mEq/L (ref 22–29)
Chloride: 105 mEq/L (ref 98–109)
Creatinine: 1.1 mg/dL (ref 0.7–1.3)
EGFR: 69 mL/min/{1.73_m2} — AB (ref >90)
GLUCOSE: 91 mg/dL (ref 70–140)
POTASSIUM: 4.1 meq/L (ref 3.5–5.1)
SODIUM: 142 meq/L (ref 136–145)
Total Bilirubin: 0.3 mg/dL (ref 0.20–1.20)
Total Protein: 6.6 g/dL (ref 6.4–8.3)

## 2016-01-22 LAB — CBC WITH DIFFERENTIAL/PLATELET
BASO%: 0.2 % (ref 0.0–2.0)
BASOS ABS: 0 10*3/uL (ref 0.0–0.1)
EOS ABS: 0.2 10*3/uL (ref 0.0–0.5)
EOS%: 4.3 % (ref 0.0–7.0)
HCT: 35.1 % — ABNORMAL LOW (ref 38.4–49.9)
HGB: 11.6 g/dL — ABNORMAL LOW (ref 13.0–17.1)
LYMPH%: 29.7 % (ref 14.0–49.0)
MCH: 29.8 pg (ref 27.2–33.4)
MCHC: 33 g/dL (ref 32.0–36.0)
MCV: 90.2 fL (ref 79.3–98.0)
MONO#: 0.5 10*3/uL (ref 0.1–0.9)
MONO%: 10.1 % (ref 0.0–14.0)
NEUT#: 2.6 10*3/uL (ref 1.5–6.5)
NEUT%: 55.7 % (ref 39.0–75.0)
Platelets: 154 10*3/uL (ref 140–400)
RBC: 3.89 10*6/uL — ABNORMAL LOW (ref 4.20–5.82)
RDW: 13.8 % (ref 11.0–14.6)
WBC: 4.6 10*3/uL (ref 4.0–10.3)
lymph#: 1.4 10*3/uL (ref 0.9–3.3)

## 2016-01-22 MED ORDER — DENOSUMAB 120 MG/1.7ML ~~LOC~~ SOLN
120.0000 mg | Freq: Once | SUBCUTANEOUS | Status: AC
  Administered 2016-01-22: 120 mg via SUBCUTANEOUS
  Filled 2016-01-22: qty 1.7

## 2016-01-22 NOTE — Patient Instructions (Signed)
Denosumab injection  What is this medicine?  DENOSUMAB (den oh sue mab) slows bone breakdown. Prolia is used to treat osteoporosis in women after menopause and in men. Xgeva is used to prevent bone fractures and other bone problems caused by cancer bone metastases. Xgeva is also used to treat giant cell tumor of the bone.  This medicine may be used for other purposes; ask your health care provider or pharmacist if you have questions.  What should I tell my health care provider before I take this medicine?  They need to know if you have any of these conditions:  -dental disease  -eczema  -infection or history of infections  -kidney disease or on dialysis  -low blood calcium or vitamin D  -malabsorption syndrome  -scheduled to have surgery or tooth extraction  -taking medicine that contains denosumab  -thyroid or parathyroid disease  -an unusual reaction to denosumab, other medicines, foods, dyes, or preservatives  -pregnant or trying to get pregnant  -breast-feeding  How should I use this medicine?  This medicine is for injection under the skin. It is given by a health care professional in a hospital or clinic setting.  If you are getting Prolia, a special MedGuide will be given to you by the pharmacist with each prescription and refill. Be sure to read this information carefully each time.  For Prolia, talk to your pediatrician regarding the use of this medicine in children. Special care may be needed. For Xgeva, talk to your pediatrician regarding the use of this medicine in children. While this drug may be prescribed for children as young as 13 years for selected conditions, precautions do apply.  Overdosage: If you think you have taken too much of this medicine contact a poison control center or emergency room at once.  NOTE: This medicine is only for you. Do not share this medicine with others.  What if I miss a dose?  It is important not to miss your dose. Call your doctor or health care professional if you are  unable to keep an appointment.  What may interact with this medicine?  Do not take this medicine with any of the following medications:  -other medicines containing denosumab  This medicine may also interact with the following medications:  -medicines that suppress the immune system  -medicines that treat cancer  -steroid medicines like prednisone or cortisone  This list may not describe all possible interactions. Give your health care provider a list of all the medicines, herbs, non-prescription drugs, or dietary supplements you use. Also tell them if you smoke, drink alcohol, or use illegal drugs. Some items may interact with your medicine.  What should I watch for while using this medicine?  Visit your doctor or health care professional for regular checks on your progress. Your doctor or health care professional may order blood tests and other tests to see how you are doing.  Call your doctor or health care professional if you get a cold or other infection while receiving this medicine. Do not treat yourself. This medicine may decrease your body's ability to fight infection.  You should make sure you get enough calcium and vitamin D while you are taking this medicine, unless your doctor tells you not to. Discuss the foods you eat and the vitamins you take with your health care professional.  See your dentist regularly. Brush and floss your teeth as directed. Before you have any dental work done, tell your dentist you are receiving this medicine.  Do   not become pregnant while taking this medicine or for 5 months after stopping it. Women should inform their doctor if they wish to become pregnant or think they might be pregnant. There is a potential for serious side effects to an unborn child. Talk to your health care professional or pharmacist for more information.  What side effects may I notice from receiving this medicine?  Side effects that you should report to your doctor or health care professional as soon as  possible:  -allergic reactions like skin rash, itching or hives, swelling of the face, lips, or tongue  -breathing problems  -chest pain  -fast, irregular heartbeat  -feeling faint or lightheaded, falls  -fever, chills, or any other sign of infection  -muscle spasms, tightening, or twitches  -numbness or tingling  -skin blisters or bumps, or is dry, peels, or red  -slow healing or unexplained pain in the mouth or jaw  -unusual bleeding or bruising  Side effects that usually do not require medical attention (Report these to your doctor or health care professional if they continue or are bothersome.):  -muscle pain  -stomach upset, gas  This list may not describe all possible side effects. Call your doctor for medical advice about side effects. You may report side effects to FDA at 1-800-FDA-1088.  Where should I keep my medicine?  This medicine is only given in a clinic, doctor's office, or other health care setting and will not be stored at home.  NOTE: This sheet is a summary. It may not cover all possible information. If you have questions about this medicine, talk to your doctor, pharmacist, or health care provider.      2016, [employer]vier/[employer] Standard. (2011-11-29 12:37:47)

## 2016-01-23 LAB — PSA: PROSTATE SPECIFIC AG, SERUM: 0.1 ng/mL (ref 0.0–4.0)

## 2016-01-29 ENCOUNTER — Other Ambulatory Visit: Payer: Medicare Other

## 2016-01-29 ENCOUNTER — Ambulatory Visit (HOSPITAL_BASED_OUTPATIENT_CLINIC_OR_DEPARTMENT_OTHER): Payer: Medicare Other

## 2016-01-29 ENCOUNTER — Ambulatory Visit (HOSPITAL_BASED_OUTPATIENT_CLINIC_OR_DEPARTMENT_OTHER): Payer: Medicare Other | Admitting: Oncology

## 2016-01-29 ENCOUNTER — Telehealth: Payer: Self-pay | Admitting: Oncology

## 2016-01-29 VITALS — BP 114/71 | HR 88 | Temp 98.5°F | Resp 18 | Ht 70.0 in | Wt 132.5 lb

## 2016-01-29 DIAGNOSIS — I951 Orthostatic hypotension: Secondary | ICD-10-CM

## 2016-01-29 DIAGNOSIS — I259 Chronic ischemic heart disease, unspecified: Secondary | ICD-10-CM

## 2016-01-29 DIAGNOSIS — Z5111 Encounter for antineoplastic chemotherapy: Secondary | ICD-10-CM | POA: Diagnosis present

## 2016-01-29 DIAGNOSIS — C7951 Secondary malignant neoplasm of bone: Secondary | ICD-10-CM

## 2016-01-29 DIAGNOSIS — M898X9 Other specified disorders of bone, unspecified site: Secondary | ICD-10-CM | POA: Diagnosis not present

## 2016-01-29 DIAGNOSIS — C61 Malignant neoplasm of prostate: Secondary | ICD-10-CM

## 2016-01-29 DIAGNOSIS — I495 Sick sinus syndrome: Secondary | ICD-10-CM

## 2016-01-29 DIAGNOSIS — E291 Testicular hypofunction: Secondary | ICD-10-CM

## 2016-01-29 DIAGNOSIS — G459 Transient cerebral ischemic attack, unspecified: Secondary | ICD-10-CM

## 2016-01-29 DIAGNOSIS — T82110D Breakdown (mechanical) of cardiac electrode, subsequent encounter: Secondary | ICD-10-CM

## 2016-01-29 DIAGNOSIS — Z950 Presence of cardiac pacemaker: Secondary | ICD-10-CM

## 2016-01-29 MED ORDER — LEUPROLIDE ACETATE (3 MONTH) 22.5 MG IM KIT
22.5000 mg | PACK | Freq: Once | INTRAMUSCULAR | Status: AC
  Administered 2016-01-29: 22.5 mg via INTRAMUSCULAR
  Filled 2016-01-29: qty 22.5

## 2016-01-29 NOTE — Telephone Encounter (Signed)
GAVE PATIENT AVS REPORT AND APPOINTMENTS FOR November.  °

## 2016-01-29 NOTE — Patient Instructions (Signed)
Leuprolide depot injection What is this medicine? LEUPROLIDE (loo PROE lide) is a man-made protein that acts like a natural hormone in the body. It decreases testosterone in men and decreases estrogen in women. In men, this medicine is used to treat advanced prostate cancer. In women, some forms of this medicine may be used to treat endometriosis, uterine fibroids, or other male hormone-related problems. This medicine may be used for other purposes; ask your health care provider or pharmacist if you have questions. What should I tell my health care provider before I take this medicine? They need to know if you have any of these conditions: -diabetes -heart disease or previous heart attack -high blood pressure -high cholesterol -osteoporosis -pain or difficulty passing urine -spinal cord metastasis -stroke -tobacco smoker -unusual vaginal bleeding (women) -an unusual or allergic reaction to leuprolide, benzyl alcohol, other medicines, foods, dyes, or preservatives -pregnant or trying to get pregnant -breast-feeding How should I use this medicine? This medicine is for injection into a muscle or for injection under the skin. It is given by a health care professional in a hospital or clinic setting. The specific product will determine how it will be given to you. Make sure you understand which product you receive and how often you will receive it. Talk to your pediatrician regarding the use of this medicine in children. Special care may be needed. Overdosage: If you think you have taken too much of this medicine contact a poison control center or emergency room at once. NOTE: This medicine is only for you. Do not share this medicine with others. What if I miss a dose? It is important not to miss a dose. Call your doctor or health care professional if you are unable to keep an appointment. Depot injections: Depot injections are given either once-monthly, every 12 weeks, every 16 weeks, or  every 24 weeks depending on the product you are prescribed. The product you are prescribed will be based on if you are male or male, and your condition. Make sure you understand your product and dosing. What may interact with this medicine? Do not take this medicine with any of the following medications: -chasteberry This medicine may also interact with the following medications: -herbal or dietary supplements, like black cohosh or DHEA -male hormones, like estrogens or progestins and birth control pills, patches, rings, or injections -male hormones, like testosterone This list may not describe all possible interactions. Give your health care provider a list of all the medicines, herbs, non-prescription drugs, or dietary supplements you use. Also tell them if you smoke, drink alcohol, or use illegal drugs. Some items may interact with your medicine. What should I watch for while using this medicine? Visit your doctor or health care professional for regular checks on your progress. During the first weeks of treatment, your symptoms may get worse, but then will improve as you continue your treatment. You may get hot flashes, increased bone pain, increased difficulty passing urine, or an aggravation of nerve symptoms. Discuss these effects with your doctor or health care professional, some of them may improve with continued use of this medicine. Male patients may experience a menstrual cycle or spotting during the first months of therapy with this medicine. If this continues, contact your doctor or health care professional. What side effects may I notice from receiving this medicine? Side effects that you should report to your doctor or health care professional as soon as possible: -allergic reactions like skin rash, itching or hives, swelling of the   face, lips, or tongue -breathing problems -chest pain -depression or memory disorders -pain in your legs or groin -pain at site where injected or  implanted -severe headache -swelling of the feet and legs -visual changes -vomiting Side effects that usually do not require medical attention (report to your doctor or health care professional if they continue or are bothersome): -breast swelling or tenderness -decrease in sex drive or performance -diarrhea -hot flashes -loss of appetite -muscle, joint, or bone pains -nausea -redness or irritation at site where injected or implanted -skin problems or acne This list may not describe all possible side effects. Call your doctor for medical advice about side effects. You may report side effects to FDA at 1-800-FDA-1088. Where should I keep my medicine? This drug is given in a hospital or clinic and will not be stored at home. NOTE: This sheet is a summary. It may not cover all possible information. If you have questions about this medicine, talk to your doctor, pharmacist, or health care provider.    2016, Elsevier/Gold Standard. (2014-02-22 14:16:23)  

## 2016-01-29 NOTE — Progress Notes (Signed)
Hematology and Oncology Follow Up Visit  Russell Browning Nov 12, 1940 75 y.o. 01/29/2016 8:54 AM    Principle Diagnosis: This is a pleasant 75 year old gentleman with  advanced prostate cancer. He has metastatic disease to the bone. It appears to be hormone sensitive at this time. His cancer was inially diagnosed in 2000.    Prior Therapy: 1. He received radiation therapy and 2 years of Lupron, but it presumably was locally advanced prostate cancer at the time of diagnosis in 2000.  2. PSA was under control from 2001 up until the early parts of 2012, where his PSA rose up to 16.9. At that time, he was under the care of Dr. 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 He subsequently started on  Lupron every 3 months.  3. He is S/P radiation therapy directed to the right hip (30 gray in 12 fractions) concluded in 07/2011 for palliative purposes conducted in [place].    Current therapy:  1. Lupron 22.5 mg every three months.  2. Delton See given every 3 months started in 10/2011.   Interim History:  Russell Browning presents today for a follow up visit. Since his last visit, he reports no recent illnesses or hospitalizations. He continues to tolerate Lupron and Xgeva without delayed issues. He does report hot flashes but no other complications. He denied recent bone pain or pathological fractures. He did have one episode of vasovagal syncope which have subsided at this time.    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 He denies any other neurological deficits. He denied any changes in his appetite or performance status. He continues to enjoy excellent quality of life.  He does not report any headaches, blurry vision, or seizures. He does not report any fevers or chills or sweats. He does not report any cough, wheezing or hemoptysis. Is not reporting any chest pain or difficulty  breathing. Does not report any dyspnea on exertion or leg edema. Has not reported any nausea or vomiting. Has not reported any palpitation or leg edema. He is not reporting any change in his bowel habits. Has not reported any urinary symptoms. Remainder of his review of systems unremarkable.   Medications: I have reviewed the patient's current medications.   Current Outpatient Prescriptions  Medication Sig Dispense Refill  . aspirin EC 81 MG EC tablet Take 1 tablet (81 mg total) by mouth daily. 30 tablet 11  . atenolol (TENORMIN) 50 MG tablet Take 12.5 mg by mouth daily. Take 1/4 tablet every day    . Calcium Carbonate-Vitamin D (CALCIUM-VITAMIN D) 500-200 MG-UNIT per tablet Take 1 tablet by mouth daily.    . clonazePAM (KLONOPIN) 1 MG tablet Take 1 mg by mouth at bedtime.     Marland Kitchen denosumab (XGEVA) 120 MG/1.7ML SOLN injection Inject 120 mg into the skin every 6 (six) weeks.    . fentaNYL (DURAGESIC - DOSED MCG/HR) 25 MCG/HR Place 1 patch onto the skin every 3 (three) days.    Marland Kitchen leuprolide (LUPRON) 22.5 MG injection Inject 22.5 mg into the muscle every 3 (three) months.    . levothyroxine (SYNTHROID, LEVOTHROID) 50 MCG tablet Take 1 tablet by mouth daily.    . nitroGLYCERIN (NITROSTAT) 0.4 MG SL tablet Place 1 tablet (0.4 mg total) under the tongue every 5 (five) minutes as needed for chest pain. 25 tablet 3  . oxyCODONE-acetaminophen (PERCOCET)  10-325 MG per tablet Take 1 tablet by mouth daily.     . pantoprazole (PROTONIX) 40 MG tablet Take 40 mg by mouth daily.    . polyethylene glycol (MIRALAX / GLYCOLAX) packet Take 17 g by mouth daily.    Marland Kitchen senna-docusate (EQ SENNA-S) 8.6-50 MG per tablet Take 2 tablets by mouth daily. 60 tablet 3  . simvastatin (ZOCOR) 40 MG tablet Take 40 mg by mouth at bedtime.      . ticagrelor (BRILINTA) 90 MG TABS tablet Take 1 tablet (90 mg total) by mouth 2 (two) times daily. 60 tablet 11  . venlafaxine XR (EFFEXOR-XR) 150 MG 24 hr capsule Take 150 mg by mouth daily.     . vitamin B-12 (CYANOCOBALAMIN) 1000 MCG tablet Take 1,000 mcg by mouth daily.       No current facility-administered medications for this visit.      Allergies:  Allergies  Allergen Reactions  . Penicillins Other (See Comments)    Passed out  . Phenazopyridine Hcl Hives  . Ramipril Hives    Past Medical History, Surgical history, Social history, and Family History were reviewed and updated.   Physical Exam: Blood pressure 114/71, pulse 88, temperature 98.5 F (36.9 C), temperature source Oral, resp. rate 18, height 5\' 10"  (1.778 m), weight 132 lb 8 oz (60.1 kg), SpO2 100 %. ECOG: 1 General appearance: Alert, awake gentleman without distress. Head: Normocephalic, without obvious abnormality no oral thrush noted. Neck: no adenopathy Lymph nodes: Cervical, supraclavicular, and axillary nodes normal. Heart:regular rate and rhythm, S1, S2 normal, no murmur, click, rub or gallop Lung:chest clear, no wheezing, rales, normal symmetric air entry Abdomin: soft, non-tender, without masses or organomegaly no rebound or guarding. EXT:no erythema, induration, or nodules   Lab Results: Lab Results  Component Value Date   WBC 4.6 01/22/2016   HGB 11.6 (L) 01/22/2016   HCT 35.1 (L) 01/22/2016   MCV 90.2 01/22/2016   PLT 154 01/22/2016      Results for YACOUB, KOEPSELL (MRN Browning) as of 01/29/2016 08:45  Ref. Range 07/11/2015 08:00 10/29/2015 07:49 01/22/2016 08:12  PSA Latest Ref Range: 0.0 - 4.0 ng/mL <0.1 0.1 0.1        Impression and Plan: 22- year old gentleman with the following issues:  1. Hormone sensitive prostate cancer he is currently on Lupron with advanced disease to the bone.   He continues to tolerate androgen deprivation without any delayed complications. His PSA remains very low at this time without any need for a different therapy. Additional hormonal therapy can be added to androgen depravation he develops a rise in his PSA.   2. Bone pain: His pain is  controlled at this time without any pain exacerbation. No recent complications or exacerbation.  3. Bone directed therapy: He is receiving this every 3 months for better tolerance. Complications associated with this therapy including hypocalcemia and osteonecrosis of the jaw were reviewed again and he is agreeable to continue.  4. Androgen depravation: He will continue Lupron 22.5 mg every 3 months and this will be given today on 01/29/2016 and repeated in 3 months.  5. Follow up on 04/30/2016 for laboratory testing and Xgeva. On 11/24 he will have an M.D. follow-up and Lupron.   [facility], MD 8/17/20178:54 AM

## 2016-02-02 ENCOUNTER — Encounter: Payer: Self-pay | Admitting: Oncology

## 2016-02-02 NOTE — Progress Notes (Signed)
Mailed detailed billing for injection for 01/29/16 to pt today.

## 2016-02-10 DIAGNOSIS — D630 Anemia in neoplastic disease: Secondary | ICD-10-CM | POA: Diagnosis not present

## 2016-02-10 DIAGNOSIS — I10 Essential (primary) hypertension: Secondary | ICD-10-CM | POA: Diagnosis not present

## 2016-02-10 DIAGNOSIS — Z1212 Encounter for screening for malignant neoplasm of rectum: Secondary | ICD-10-CM | POA: Diagnosis not present

## 2016-02-10 DIAGNOSIS — E782 Mixed hyperlipidemia: Secondary | ICD-10-CM | POA: Diagnosis not present

## 2016-02-10 DIAGNOSIS — Z6820 Body mass index (BMI) 20.0-20.9, adult: Secondary | ICD-10-CM | POA: Diagnosis not present

## 2016-02-10 DIAGNOSIS — F331 Major depressive disorder, recurrent, moderate: Secondary | ICD-10-CM | POA: Diagnosis not present

## 2016-02-10 DIAGNOSIS — C61 Malignant neoplasm of prostate: Secondary | ICD-10-CM | POA: Diagnosis not present

## 2016-02-10 DIAGNOSIS — G2581 Restless legs syndrome: Secondary | ICD-10-CM | POA: Diagnosis not present

## 2016-02-11 ENCOUNTER — Encounter: Payer: Self-pay | Admitting: Internal Medicine

## 2016-02-11 ENCOUNTER — Ambulatory Visit (INDEPENDENT_AMBULATORY_CARE_PROVIDER_SITE_OTHER): Payer: Medicare Other | Admitting: Internal Medicine

## 2016-02-11 VITALS — BP 118/64 | HR 62 | Ht 70.0 in | Wt 135.0 lb

## 2016-02-11 DIAGNOSIS — I259 Chronic ischemic heart disease, unspecified: Secondary | ICD-10-CM | POA: Diagnosis not present

## 2016-02-11 DIAGNOSIS — I495 Sick sinus syndrome: Secondary | ICD-10-CM | POA: Diagnosis not present

## 2016-02-11 DIAGNOSIS — Z950 Presence of cardiac pacemaker: Secondary | ICD-10-CM | POA: Diagnosis not present

## 2016-02-11 NOTE — Progress Notes (Signed)
PCP:  Gar Ponto, MD Primary Cardiologist:  Dr Domenic Polite  The patient presents today for routine electrophysiology followup.  Since last being seen in our clinic, the patient reports doing reasonably well.  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  Today, he denies symptoms of palpitations, chest pain, shortness of breath, orthopnea, dizziness, presyncope, syncope, or neurologic sequela.  The patient feels that he is tolerating medications without difficulties and is otherwise without complaint today.   Past Medical History:  Diagnosis Date  . Anemia   . CAD (coronary artery disease), native coronary artery    a. DES to proximal, mid, and distal RCA 2006. b. 12/2014 - [place] s/p  PTCA to the mid-distal previously placed RCA stents, otherwise nonobstructive CAD for continued medical therapy.  Marland Kitchen History of TIAs   . Mixed hyperlipidemia   . 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 Prostate cancer metastatic to bone ([place])   . Sinus node dysfunction [facility])    [employer]onic pacemaker - Dr. Rayann Heman   Past Surgical History:  Procedure Laterality Date  . CARDIAC CATHETERIZATION N/A 01/06/2015   Procedure: Left Heart Cath and Coronary Angiography;  Surgeon: Troy Sine, MD;  Location: [facility];  Service: Cardiovascular;  Laterality: N/A;  . PACEMAKER INSERTION     MDT    Current Outpatient Prescriptions  Medication Sig Dispense Refill  . aspirin EC 81 MG EC tablet Take 1 tablet (81 mg total) by mouth daily. 30 tablet 11  . atenolol (TENORMIN) 50 MG tablet Take 12.5 mg by mouth daily. Take 1/4 tablet every day    . Calcium Carbonate-Vitamin D (CALCIUM-VITAMIN D) 500-200 MG-UNIT per tablet Take 1 tablet by mouth daily.    . clonazePAM (KLONOPIN) 1 MG tablet Take 1 mg by mouth at bedtime.     Marland Kitchen denosumab (XGEVA) 120 MG/1.7ML SOLN injection Inject 120 mg  into the skin every 6 (six) weeks.    . fentaNYL (DURAGESIC - DOSED MCG/HR) 25 MCG/HR Place 1 patch onto the skin every 3 (three) days.    Marland Kitchen leuprolide (LUPRON) 22.5 MG injection Inject 22.5 mg into the muscle every 3 (three) months.    . levothyroxine (SYNTHROID, LEVOTHROID) 50 MCG tablet Take 1 tablet by mouth daily.    . nitroGLYCERIN (NITROSTAT) 0.4 MG SL tablet Place 1 tablet (0.4 mg total) under the tongue every 5 (five) minutes as needed for chest pain. 25 tablet 3  . oxyCODONE-acetaminophen (PERCOCET) 10-325 MG per tablet Take 1 tablet by mouth daily.     . pantoprazole (PROTONIX) 40 MG tablet Take 40 mg by mouth daily.    . polyethylene glycol (MIRALAX / GLYCOLAX) packet Take 17 g by mouth daily.    . polyethylene glycol powder (GLYCOLAX/MIRALAX) powder     . senna-docusate (EQ SENNA-S) 8.6-50 MG per tablet Take 2 tablets by mouth daily. 60 tablet 3  . simvastatin (ZOCOR) 40 MG tablet Take 40 mg by mouth at bedtime.      . ticagrelor (BRILINTA) 90 MG TABS tablet Take 1 tablet (90 mg total) by mouth 2 (two) times daily. 60 tablet 11  . venlafaxine XR (EFFEXOR-XR) 150 MG 24 hr capsule Take 150 mg by mouth daily.    . vitamin B-12 (CYANOCOBALAMIN) 1000 MCG tablet Take 1,000 mcg by mouth daily.       No current facility-administered medications for this  visit.     Allergies  Allergen Reactions  . Penicillins Other (See Comments)    Passed out  . Phenazopyridine Hcl Hives  . Ramipril Hives    Social History   Social History  . Marital status: Married    Spouse name: N/A  . Number of children: N/A  . Years of education: N/A   Occupational History  . Not on file.   Social History Main Topics  . Smoking status: Former Smoker    Packs/day: 1.00    Years: 5.00    Types: Cigarettes    Start date: 09/15/1951    Quit date: 06/14/1965  . Smokeless tobacco: Former Systems developer    Types: [place] date: 06/14/1978     Comment: chewed tobacco 5 years 1PPD  . Alcohol use No  . Drug use:  No  . Sexual activity: Not on file   Other Topics Concern  . Not on file   Social History Narrative  . No narrative on file    Physical Exam: Vitals:   02/11/16 0911  BP: 118/64  Pulse: 62  SpO2: 97%  Weight: 135 lb (61.2 kg)  Height: 5\' 10"  (1.778 m)    GEN- The patient is well appearing, alert and oriented x 3 today.   Head- normocephalic, atraumatic Eyes-  Sclera clear, conjunctiva pink Ears- hearing intact Oropharynx- clear Neck- supple, no JVP Lungs- Clear to ausculation bilaterally, normal work of breathing Chest- pacemaker pocket is well healed Heart- Regular rate and rhythm, no murmurs, rubs or gallops, PMI not laterally displaced GI- soft, NT, ND, + BS Extremities- no clubbing, cyanosis, or edema  Pacemaker interrogation- reviewed in detail today,  See PACEART report  Assessment and Plan:  1. Sick sinus syndrome Atrial lead impedance is chronically low.  Pt reports "Dr Caryl Comes had to do my last generator change with bolt cutters and then lead got frayed.  He put silicone on it".   Battery status remains good (1-4.5 years). No changes at this time Will likely replace atrial lead at time of generator change.  2. CAD No ischemic symptoms No changes today  Return to the device clinic in 1year [facility] every 3 months Follow-up with Dr Domenic Polite as scheduled  Thompson Grayer MD, [facility] 02/11/2016 9:47 AM

## 2016-02-11 NOTE — Patient Instructions (Signed)
Medication Instructions:  Continue all current medications.  Labwork: none  Testing/Procedures: none  Follow-Up: Your physician wants you to follow up in:  1 year.  You will receive a reminder letter in the mail one-two months in advance.  If you don't receive a letter, please call our office to schedule the follow up appointment   Any Other Special Instructions Will Be Listed Below (If Applicable). Remote monitoring is used to monitor your Pacemaker of ICD from home. This monitoring reduces the number of office visits required to check your device to one time per year. It allows [place] to keep an eye on the functioning of your device to ensure it is working properly. You are scheduled for a device check from home on 05/12/16. You may send your transmission at any time that day. If you have a wireless device, the transmission will be sent automatically. After your physician reviews your transmission, you will receive a postcard with your next transmission date.    If you need a refill on your cardiac medications before your next appointment, please call your pharmacy.

## 2016-02-12 LAB — CUP PACEART INCLINIC DEVICE CHECK
Brady Statistic AP VP Percent: 0 %
Brady Statistic AS VP Percent: 0 %
Brady Statistic AS VS Percent: 14 %
Date Time Interrogation Session: 20170830133249
Implantable Lead Implant Date: 19990408
Implantable Lead Implant Date: 19990408
Implantable Lead Location: 753860
Lead Channel Impedance Value: 1066 Ohm
Lead Channel Impedance Value: 147 Ohm
Lead Channel Pacing Threshold Amplitude: 1.75 V
Lead Channel Pacing Threshold Pulse Width: 0.76 ms
Lead Channel Sensing Intrinsic Amplitude: 0.7 mV
Lead Channel Setting Pacing Amplitude: 2 V
Lead Channel Setting Pacing Pulse Width: 0.76 ms
MDC IDC LEAD LOCATION: 753859
MDC IDC LEAD MODEL: 4035
MDC IDC LEAD MODEL: 4524
MDC IDC LEAD SERIAL: 208202
MDC IDC MSMT BATTERY IMPEDANCE: 1708 Ohm
MDC IDC MSMT BATTERY REMAINING LONGEVITY: 35 mo
MDC IDC MSMT BATTERY VOLTAGE: 2.75 V
MDC IDC MSMT LEADCHNL RA PACING THRESHOLD AMPLITUDE: 1 V
MDC IDC MSMT LEADCHNL RA PACING THRESHOLD PULSEWIDTH: 0.4 ms
MDC IDC MSMT LEADCHNL RV PACING THRESHOLD AMPLITUDE: 1.25 V
MDC IDC MSMT LEADCHNL RV PACING THRESHOLD PULSEWIDTH: 0.4 ms
MDC IDC MSMT LEADCHNL RV SENSING INTR AMPL: 11.2 mV
MDC IDC SET LEADCHNL RV PACING AMPLITUDE: 2.5 V
MDC IDC SET LEADCHNL RV SENSING SENSITIVITY: 4 mV
MDC IDC STAT BRADY AP VS PERCENT: 86 %

## 2016-03-12 ENCOUNTER — Encounter: Payer: Medicare Other | Admitting: Internal Medicine

## 2016-03-19 ENCOUNTER — Encounter: Payer: Medicare Other | Admitting: Internal Medicine

## 2016-04-16 ENCOUNTER — Encounter: Payer: Self-pay | Admitting: *Deleted

## 2016-04-16 NOTE — Progress Notes (Signed)
Cardiology Office Note  Date: 04/19/2016   ID: Russell Browning, DOB Jun 08, 1941, MRN [id number]  PCP: Gar Ponto, MD  Primary Cardiologist: Rozann Lesches, MD   Chief Complaint  Patient presents with  . Coronary Artery Disease    History of Present Illness: Russell Browning is a 75 y.o. male last seen in May. He is here today with his brother-in-law for a follow-up visit. Reports no angina symptoms or nitroglycerin use. Stable NYHA class II dyspnea.  I reviewed his medications which are stable from a cardiac perspective. He continues on aspirin, Brilinta, Zocor, atenolol, and as needed nitroglycerin. Last cardiac catheterization indicated plan for long-term DAPT. He has had no bleeding problems. I reviewed his ECG today which shows an atrial paced rhythm.  He continues to follow with Dr. Rayann Heman in the device clinic, Medtronic pacemaker in place.  I reviewed his echocardiogram from October of last year. LVEF was normal range.  Past Medical History:  Diagnosis Date  . Anemia   . CAD (coronary artery disease), native coronary artery    a. DES to proximal, mid, and distal RCA 2006. b. 12/2014 - [place] s/p  PTCA to the mid-distal previously placed RCA stents, otherwise nonobstructive CAD for continued medical therapy.  Marland Kitchen History of TIAs   . Mixed hyperlipidemia   . 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 Prostate cancer metastatic to bone ([place])   . Sinus node dysfunction [facility])    Medtronic pacemaker - Dr. Rayann Heman    Past Surgical History:  Procedure Laterality Date  . CARDIAC CATHETERIZATION N/A 01/06/2015   Procedure: Left Heart Cath and Coronary Angiography;  Surgeon: Troy Sine, MD;  Location: [facility];  Service: Cardiovascular;  Laterality: N/A;  . PACEMAKER INSERTION     MDT    Current Outpatient Prescriptions  Medication Sig Dispense Refill  .  aspirin EC 81 MG EC tablet Take 1 tablet (81 mg total) by mouth daily. 30 tablet 11  . atenolol (TENORMIN) 50 MG tablet Take 12.5 mg by mouth daily. Take 1/4 tablet every day    . Calcium Carbonate-Vitamin D (CALCIUM-VITAMIN D) 500-200 MG-UNIT per tablet Take 1 tablet by mouth daily.    . clonazePAM (KLONOPIN) 1 MG tablet Take 1 mg by mouth at bedtime.     Marland Kitchen denosumab (XGEVA) 120 MG/1.7ML SOLN injection Inject 120 mg into the skin every 6 (six) weeks.    . fentaNYL (DURAGESIC - DOSED MCG/HR) 25 MCG/HR Place 1 patch onto the skin every 3 (three) days.    Marland Kitchen leuprolide (LUPRON) 22.5 MG injection Inject 22.5 mg into the muscle every 3 (three) months.    . levothyroxine (SYNTHROID, LEVOTHROID) 50 MCG tablet Take 1 tablet by mouth daily.    . nitroGLYCERIN (NITROSTAT) 0.4 MG SL tablet Place 1 tablet (0.4 mg total) under the tongue every 5 (five) minutes as needed for chest pain. 25 tablet 3  . oxyCODONE-acetaminophen (PERCOCET) 10-325 MG per tablet Take 1 tablet by mouth daily.     . pantoprazole (PROTONIX) 40 MG tablet Take 40 mg by mouth daily.    . polyethylene glycol (MIRALAX / GLYCOLAX) packet Take 17 g by mouth daily.    . polyethylene glycol powder (GLYCOLAX/MIRALAX) powder     . senna-docusate (EQ SENNA-S) 8.6-50 MG per tablet Take 2 tablets by mouth daily. 60 tablet 3  .  simvastatin (ZOCOR) 40 MG tablet Take 40 mg by mouth at bedtime.      . ticagrelor (BRILINTA) 90 MG TABS tablet Take 1 tablet (90 mg total) by mouth 2 (two) times daily. 60 tablet 11  . venlafaxine XR (EFFEXOR-XR) 150 MG 24 hr capsule Take 150 mg by mouth daily.    . vitamin B-12 (CYANOCOBALAMIN) 1000 MCG tablet Take 1,000 mcg by mouth daily.       No current facility-administered medications for this visit.    Allergies:  Penicillins; Phenazopyridine hcl; and Ramipril   Social History: The patient  reports that he quit smoking about 50 years ago. His smoking use included Cigarettes. He started smoking about 64 years ago.  He has a 5.00 pack-year smoking history. He quit smokeless tobacco use about 37 years ago. His smokeless tobacco use included Chew. He reports that he does not drink alcohol or use drugs.   Family History: [name] family history includes Heart disease in his father and sister.   ROS:  Please see the history of present illness. Otherwise, complete review of systems is positive for chronic weakness.  All other systems are reviewed and negative.   Physical Exam: VS:  BP 118/72   Pulse 83   Ht 5\' 10"  (1.778 m)   Wt 134 lb (60.8 kg)   SpO2 97%   BMI 19.23 kg/m , BMI Body mass index is 19.23 kg/m.  Wt Readings from Last 3 Encounters:  04/19/16 134 lb (60.8 kg)  02/11/16 135 lb (61.2 kg)  01/29/16 132 lb 8 oz (60.1 kg)    General: Thin male, appears comfortable at rest. HEENT: Conjunctiva and lids normal, oropharynx clear. Neck: Supple, no elevated JVP or carotid bruits, no thyromegaly. Lungs: Clear to auscultation, nonlabored breathing at rest. Cardiac: Regular rate and rhythm, no S3 or significant systolic murmur, no pericardial rub. Abdomen: Soft, nontender, bowel sounds present. Extremities: No pitting edema, distal pulses 2+. Skin: Warm and dry. Musculoskeletal: No kyphosis. Neuropsychiatric: Alert and oriented x3, affect grossly appropriate.  ECG: I personally reviewed the tracing from 01/07/2015 which showed an atrial paced rhythm with increased voltage.  Recent Labwork: 01/22/2016: ALT 11; AST 17; BUN 11.5; Creatinine 1.1; HGB 11.6; Platelets 154; Potassium 4.1; Sodium 142     Component Value Date/Time   CHOL 171 01/04/2015 0431   TRIG 106 01/04/2015 0431   HDL 42 01/04/2015 0431   CHOLHDL 4.1 01/04/2015 0431   VLDL 21 01/04/2015 0431   LDLCALC 108 (H) 01/04/2015 0431    Other Studies Reviewed Today:  Echocardiogram 04/09/2015: Study Conclusions  - Left ventricle: The cavity size was normal. Wall thickness was  normal. Systolic function was normal. The  estimated ejection  fraction was in the range of 55% to 60%. Indeterminate diastolic  function. Wall motion was normal; there were no regional wall  motion abnormalities. - Aortic valve: Mildly calcified annulus. Mildly thickened  leaflets. There was mild to moderate regurgitation. The AI VC is  0.3 cm. Valve area (VTI): 3.02 cm^2. Valve area (Vmax): 2.89  cm^2. Valve area (Vmean): 2.98 cm^2. Regurgitation pressure  half-time: 568 ms. - Mitral valve: There was mild regurgitation. - Left atrium: The atrium was moderately dilated. - Systemic veins: IVC is small suggesting low RA pressure and  hypovolemia. - Technically difficult study.  Assessment and Plan:  1. CAD as outlined above, most recently status post PTCA of the mid to distal RCA at prior stent sites in July 2016. He is not reporting any  significant angina symptoms on medical therapy. Long-term DAPT was recommended. We will continue with observation for now.  2. Sinus node dysfunction status post Medtronic pacemaker. Keep follow-up with Dr. Rayann Heman. Today's tracing shows an atrial paced rhythm.  3. Hyperlipidemia, on Zocor. He follows with Dr. Quillian Quince and will be having a physical with lab work later this month.  4. Prior history of TIAs, no recurrence. He is on antiplatelet therapy and statin.  5. Aortic regurgitation, mild to moderate by echocardiogram from last year.  Current medicines were reviewed with the patient today.   Orders Placed This Encounter  Procedures  . EKG 12-Lead    Disposition: Follow-up in 6 months.  Signed, Satira Sark, MD, [facility] 04/19/2016 9:49 AM    [facility] [place], [place],  [address] Phone: [phone]; Fax: [phone]

## 2016-04-19 ENCOUNTER — Ambulatory Visit (INDEPENDENT_AMBULATORY_CARE_PROVIDER_SITE_OTHER): Payer: Medicare Other | Admitting: Cardiology

## 2016-04-19 ENCOUNTER — Encounter: Payer: Self-pay | Admitting: Cardiology

## 2016-04-19 VITALS — BP 118/72 | HR 83 | Ht 70.0 in | Wt 134.0 lb

## 2016-04-19 DIAGNOSIS — Z950 Presence of cardiac pacemaker: Secondary | ICD-10-CM

## 2016-04-19 DIAGNOSIS — E782 Mixed hyperlipidemia: Secondary | ICD-10-CM

## 2016-04-19 DIAGNOSIS — I259 Chronic ischemic heart disease, unspecified: Secondary | ICD-10-CM

## 2016-04-19 DIAGNOSIS — I495 Sick sinus syndrome: Secondary | ICD-10-CM

## 2016-04-19 DIAGNOSIS — I2510 Atherosclerotic heart disease of native coronary artery without angina pectoris: Secondary | ICD-10-CM | POA: Diagnosis not present

## 2016-04-19 DIAGNOSIS — I351 Nonrheumatic aortic (valve) insufficiency: Secondary | ICD-10-CM

## 2016-04-19 NOTE — Patient Instructions (Signed)

## 2016-04-30 ENCOUNTER — Other Ambulatory Visit (HOSPITAL_BASED_OUTPATIENT_CLINIC_OR_DEPARTMENT_OTHER): Payer: Medicare Other

## 2016-04-30 ENCOUNTER — Ambulatory Visit (HOSPITAL_BASED_OUTPATIENT_CLINIC_OR_DEPARTMENT_OTHER): Payer: Medicare Other

## 2016-04-30 VITALS — BP 127/77 | HR 96 | Temp 97.6°F | Resp 18

## 2016-04-30 DIAGNOSIS — C7951 Secondary malignant neoplasm of bone: Secondary | ICD-10-CM | POA: Diagnosis not present

## 2016-04-30 DIAGNOSIS — G459 Transient cerebral ischemic attack, unspecified: Secondary | ICD-10-CM

## 2016-04-30 DIAGNOSIS — C61 Malignant neoplasm of prostate: Secondary | ICD-10-CM

## 2016-04-30 DIAGNOSIS — I495 Sick sinus syndrome: Secondary | ICD-10-CM

## 2016-04-30 DIAGNOSIS — T82110D Breakdown (mechanical) of cardiac electrode, subsequent encounter: Secondary | ICD-10-CM

## 2016-04-30 DIAGNOSIS — Z950 Presence of cardiac pacemaker: Secondary | ICD-10-CM

## 2016-04-30 DIAGNOSIS — I259 Chronic ischemic heart disease, unspecified: Secondary | ICD-10-CM

## 2016-04-30 DIAGNOSIS — I951 Orthostatic hypotension: Secondary | ICD-10-CM

## 2016-04-30 LAB — COMPREHENSIVE METABOLIC PANEL
ALT: 15 U/L (ref 0–55)
ANION GAP: 11 meq/L (ref 3–11)
AST: 18 U/L (ref 5–34)
Albumin: 4 g/dL (ref 3.5–5.0)
Alkaline Phosphatase: 21 U/L — ABNORMAL LOW (ref 40–150)
BUN: 15.2 mg/dL (ref 7.0–26.0)
CHLORIDE: 104 meq/L (ref 98–109)
CO2: 27 meq/L (ref 22–29)
Calcium: 9.8 mg/dL (ref 8.4–10.4)
Creatinine: 1 mg/dL (ref 0.7–1.3)
EGFR: 72 mL/min/{1.73_m2} — AB (ref >90)
Glucose: 64 mg/dl — ABNORMAL LOW (ref 70–140)
POTASSIUM: 4 meq/L (ref 3.5–5.1)
Sodium: 141 mEq/L (ref 136–145)
Total Bilirubin: 0.31 mg/dL (ref 0.20–1.20)
Total Protein: 6.9 g/dL (ref 6.4–8.3)

## 2016-04-30 LAB — CBC WITH DIFFERENTIAL/PLATELET
BASO%: 0.5 % (ref 0.0–2.0)
BASOS ABS: 0 10*3/uL (ref 0.0–0.1)
EOS%: 5 % (ref 0.0–7.0)
Eosinophils Absolute: 0.3 10*3/uL (ref 0.0–0.5)
HCT: 36.7 % — ABNORMAL LOW (ref 38.4–49.9)
HGB: 12 g/dL — ABNORMAL LOW (ref 13.0–17.1)
LYMPH%: 31.6 % (ref 14.0–49.0)
MCH: 29.4 pg (ref 27.2–33.4)
MCHC: 32.7 g/dL (ref 32.0–36.0)
MCV: 90.1 fL (ref 79.3–98.0)
MONO#: 0.6 10*3/uL (ref 0.1–0.9)
MONO%: 10.2 % (ref 0.0–14.0)
NEUT#: 3.2 10*3/uL (ref 1.5–6.5)
NEUT%: 52.7 % (ref 39.0–75.0)
PLATELETS: 186 10*3/uL (ref 140–400)
RBC: 4.07 10*6/uL — AB (ref 4.20–5.82)
RDW: 14.1 % (ref 11.0–14.6)
WBC: 6.1 10*3/uL (ref 4.0–10.3)
lymph#: 1.9 10*3/uL (ref 0.9–3.3)

## 2016-04-30 MED ORDER — DENOSUMAB 120 MG/1.7ML ~~LOC~~ SOLN
120.0000 mg | Freq: Once | SUBCUTANEOUS | Status: AC
  Administered 2016-04-30: 120 mg via SUBCUTANEOUS
  Filled 2016-04-30: qty 1.7

## 2016-04-30 NOTE — Patient Instructions (Signed)
Denosumab injection  What is this medicine?  DENOSUMAB (den oh sue mab) slows bone breakdown. Prolia is used to treat osteoporosis in women after menopause and in men. Xgeva is used to prevent bone fractures and other bone problems caused by cancer bone metastases. Xgeva is also used to treat giant cell tumor of the bone.  This medicine may be used for other purposes; ask your health care provider or pharmacist if you have questions.  What should I tell my health care provider before I take this medicine?  They need to know if you have any of these conditions:  -dental disease  -eczema  -infection or history of infections  -kidney disease or on dialysis  -low blood calcium or vitamin D  -malabsorption syndrome  -scheduled to have surgery or tooth extraction  -taking medicine that contains denosumab  -thyroid or parathyroid disease  -an unusual reaction to denosumab, other medicines, foods, dyes, or preservatives  -pregnant or trying to get pregnant  -breast-feeding  How should I use this medicine?  This medicine is for injection under the skin. It is given by a health care professional in a hospital or clinic setting.  If you are getting Prolia, a special MedGuide will be given to you by the pharmacist with each prescription and refill. Be sure to read this information carefully each time.  For Prolia, talk to your pediatrician regarding the use of this medicine in children. Special care may be needed. For Xgeva, talk to your pediatrician regarding the use of this medicine in children. While this drug may be prescribed for children as young as 13 years for selected conditions, precautions do apply.  Overdosage: If you think you have taken too much of this medicine contact a poison control center or emergency room at once.  NOTE: This medicine is only for you. Do not share this medicine with others.  What if I miss a dose?  It is important not to miss your dose. Call your doctor or health care professional if you are  unable to keep an appointment.  What may interact with this medicine?  Do not take this medicine with any of the following medications:  -other medicines containing denosumab  This medicine may also interact with the following medications:  -medicines that suppress the immune system  -medicines that treat cancer  -steroid medicines like prednisone or cortisone  This list may not describe all possible interactions. Give your health care provider a list of all the medicines, herbs, non-prescription drugs, or dietary supplements you use. Also tell them if you smoke, drink alcohol, or use illegal drugs. Some items may interact with your medicine.  What should I watch for while using this medicine?  Visit your doctor or health care professional for regular checks on your progress. Your doctor or health care professional may order blood tests and other tests to see how you are doing.  Call your doctor or health care professional if you get a cold or other infection while receiving this medicine. Do not treat yourself. This medicine may decrease your body's ability to fight infection.  You should make sure you get enough calcium and vitamin D while you are taking this medicine, unless your doctor tells you not to. Discuss the foods you eat and the vitamins you take with your health care professional.  See your dentist regularly. Brush and floss your teeth as directed. Before you have any dental work done, tell your dentist you are receiving this medicine.  Do   not become pregnant while taking this medicine or for 5 months after stopping it. Women should inform their doctor if they wish to become pregnant or think they might be pregnant. There is a potential for serious side effects to an unborn child. Talk to your health care professional or pharmacist for more information.  What side effects may I notice from receiving this medicine?  Side effects that you should report to your doctor or health care professional as soon as  possible:  -allergic reactions like skin rash, itching or hives, swelling of the face, lips, or tongue  -breathing problems  -chest pain  -fast, irregular heartbeat  -feeling faint or lightheaded, falls  -fever, chills, or any other sign of infection  -muscle spasms, tightening, or twitches  -numbness or tingling  -skin blisters or bumps, or is dry, peels, or red  -slow healing or unexplained pain in the mouth or jaw  -unusual bleeding or bruising  Side effects that usually do not require medical attention (Report these to your doctor or health care professional if they continue or are bothersome.):  -muscle pain  -stomach upset, gas  This list may not describe all possible side effects. Call your doctor for medical advice about side effects. You may report side effects to FDA at 1-800-FDA-1088.  Where should I keep my medicine?  This medicine is only given in a clinic, doctor's office, or other health care setting and will not be stored at home.  NOTE: This sheet is a summary. It may not cover all possible information. If you have questions about this medicine, talk to your doctor, pharmacist, or health care provider.      2016, [employer]vier/[employer] Standard. (2011-11-29 12:37:47)

## 2016-05-01 LAB — PSA: Prostate Specific Ag, Serum: 0.1 ng/mL (ref 0.0–4.0)

## 2016-05-05 DIAGNOSIS — D630 Anemia in neoplastic disease: Secondary | ICD-10-CM | POA: Diagnosis not present

## 2016-05-05 DIAGNOSIS — I10 Essential (primary) hypertension: Secondary | ICD-10-CM | POA: Diagnosis not present

## 2016-05-05 DIAGNOSIS — E782 Mixed hyperlipidemia: Secondary | ICD-10-CM | POA: Diagnosis not present

## 2016-05-05 DIAGNOSIS — K210 Gastro-esophageal reflux disease with esophagitis: Secondary | ICD-10-CM | POA: Diagnosis not present

## 2016-05-07 ENCOUNTER — Telehealth: Payer: Self-pay | Admitting: Oncology

## 2016-05-07 ENCOUNTER — Ambulatory Visit (HOSPITAL_BASED_OUTPATIENT_CLINIC_OR_DEPARTMENT_OTHER): Payer: Medicare Other | Admitting: Oncology

## 2016-05-07 ENCOUNTER — Ambulatory Visit (HOSPITAL_BASED_OUTPATIENT_CLINIC_OR_DEPARTMENT_OTHER): Payer: Medicare Other

## 2016-05-07 VITALS — BP 120/71 | HR 93 | Temp 97.9°F | Resp 18 | Ht 70.0 in | Wt 136.2 lb

## 2016-05-07 DIAGNOSIS — C61 Malignant neoplasm of prostate: Secondary | ICD-10-CM | POA: Diagnosis not present

## 2016-05-07 DIAGNOSIS — E291 Testicular hypofunction: Secondary | ICD-10-CM | POA: Diagnosis not present

## 2016-05-07 DIAGNOSIS — Z5111 Encounter for antineoplastic chemotherapy: Secondary | ICD-10-CM | POA: Diagnosis present

## 2016-05-07 DIAGNOSIS — M898X9 Other specified disorders of bone, unspecified site: Secondary | ICD-10-CM

## 2016-05-07 DIAGNOSIS — C7951 Secondary malignant neoplasm of bone: Secondary | ICD-10-CM

## 2016-05-07 MED ORDER — LEUPROLIDE ACETATE (3 MONTH) 22.5 MG IM KIT
22.5000 mg | PACK | Freq: Once | INTRAMUSCULAR | Status: AC
  Administered 2016-05-07: 22.5 mg via INTRAMUSCULAR
  Filled 2016-05-07: qty 22.5

## 2016-05-07 NOTE — Telephone Encounter (Signed)
Appointments scheduled, per 05/07/16 los. A copy of the AVS report and appointment schedule was given to the patient, per 05/07/16 los.  °

## 2016-05-07 NOTE — Patient Instructions (Signed)
Leuprolide depot injection What is this medicine? LEUPROLIDE (loo PROE lide) is a man-made protein that acts like a natural hormone in the body. It decreases testosterone in men and decreases estrogen in women. In men, this medicine is used to treat advanced prostate cancer. In women, some forms of this medicine may be used to treat endometriosis, uterine fibroids, or other male hormone-related problems. This medicine may be used for other purposes; ask your health care provider or pharmacist if you have questions. What should I tell my health care provider before I take this medicine? They need to know if you have any of these conditions: -diabetes -heart disease or previous heart attack -high blood pressure -high cholesterol -osteoporosis -pain or difficulty passing urine -spinal cord metastasis -stroke -tobacco smoker -unusual vaginal bleeding (women) -an unusual or allergic reaction to leuprolide, benzyl alcohol, other medicines, foods, dyes, or preservatives -pregnant or trying to get pregnant -breast-feeding How should I use this medicine? This medicine is for injection into a muscle or for injection under the skin. It is given by a health care professional in a hospital or clinic setting. The specific product will determine how it will be given to you. Make sure you understand which product you receive and how often you will receive it. Talk to your pediatrician regarding the use of this medicine in children. Special care may be needed. Overdosage: If you think you have taken too much of this medicine contact a poison control center or emergency room at once. NOTE: This medicine is only for you. Do not share this medicine with others. What if I miss a dose? It is important not to miss a dose. Call your doctor or health care professional if you are unable to keep an appointment. Depot injections: Depot injections are given either once-monthly, every 12 weeks, every 16 weeks, or  every 24 weeks depending on the product you are prescribed. The product you are prescribed will be based on if you are male or male, and your condition. Make sure you understand your product and dosing. What may interact with this medicine? Do not take this medicine with any of the following medications: -chasteberry This medicine may also interact with the following medications: -herbal or dietary supplements, like black cohosh or DHEA -male hormones, like estrogens or progestins and birth control pills, patches, rings, or injections -male hormones, like testosterone This list may not describe all possible interactions. Give your health care provider a list of all the medicines, herbs, non-prescription drugs, or dietary supplements you use. Also tell them if you smoke, drink alcohol, or use illegal drugs. Some items may interact with your medicine. What should I watch for while using this medicine? Visit your doctor or health care professional for regular checks on your progress. During the first weeks of treatment, your symptoms may get worse, but then will improve as you continue your treatment. You may get hot flashes, increased bone pain, increased difficulty passing urine, or an aggravation of nerve symptoms. Discuss these effects with your doctor or health care professional, some of them may improve with continued use of this medicine. Male patients may experience a menstrual cycle or spotting during the first months of therapy with this medicine. If this continues, contact your doctor or health care professional. What side effects may I notice from receiving this medicine? Side effects that you should report to your doctor or health care professional as soon as possible: -allergic reactions like skin rash, itching or hives, swelling of the   face, lips, or tongue -breathing problems -chest pain -depression or memory disorders -pain in your legs or groin -pain at site where injected or  implanted -severe headache -swelling of the feet and legs -visual changes -vomiting Side effects that usually do not require medical attention (report to your doctor or health care professional if they continue or are bothersome): -breast swelling or tenderness -decrease in sex drive or performance -diarrhea -hot flashes -loss of appetite -muscle, joint, or bone pains -nausea -redness or irritation at site where injected or implanted -skin problems or acne This list may not describe all possible side effects. Call your doctor for medical advice about side effects. You may report side effects to FDA at 1-800-FDA-1088. Where should I keep my medicine? This drug is given in a hospital or clinic and will not be stored at home. NOTE: This sheet is a summary. It may not cover all possible information. If you have questions about this medicine, talk to your doctor, pharmacist, or health care provider.    2016, Elsevier/Gold Standard. (2014-02-22 14:16:23)  

## 2016-05-07 NOTE — Progress Notes (Signed)
Hematology and Oncology Follow Up Visit  Russell Browning [id number] 03-19-1941 75 y.o. 05/07/2016 9:00 AM    Principle Diagnosis: This is a pleasant 75 year old gentleman with  advanced prostate cancer. He has metastatic disease to the bone. It appears to be hormone sensitive at this time. His cancer was inially diagnosed in 2000.    Prior Therapy: 1. He received radiation therapy and 2 years of Lupron, but it presumably was locally advanced prostate cancer at the time of diagnosis in 2000.  2. PSA was under control from 2001 up until the early parts of 2012, where his PSA rose up to 16.9. At that time, he was under the care of Dr. 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 He subsequently started on  Lupron every 3 months.  3. He is S/P radiation therapy directed to the right hip (30 gray in 12 fractions) concluded in 07/2011 for palliative purposes conducted in [place].    Current therapy:  1. Lupron 22.5 mg every three months.  2. Delton See given every 3 months started in 10/2011.   Interim History:  Russell Browning presents today for a follow up visit. Since his last visit, he reports no changes in his health. He remains stable without any exacerbation of his pain. He continues to use fentanyl patch with very little breakthrough pain medication. He continues to tolerate Lupron and Xgeva without delayed issues. He does report hot flashes but no other complications. He denied recent bone pain or pathological fractures.   He remains active and attends to activities of daily living without any decline. He did not report any pathological fractures, bone pain or exacerbation of his chronic pain. His appetite remain excellent and have gained more weight.  He does not report any headaches, blurry vision, or seizures. He does not report any fevers or chills or sweats. He does not report any cough, wheezing or hemoptysis. Is  not reporting any chest pain or difficulty breathing. Does not report any dyspnea on exertion or leg edema. Has not reported any nausea or vomiting. Has not reported any palpitation or leg edema. He is not reporting any change in his bowel habits. Has not reported any urinary symptoms. Remainder of his review of systems unremarkable.   Medications: I have reviewed the patient's current medications.   Current Outpatient Prescriptions  Medication Sig Dispense Refill  . aspirin EC 81 MG EC tablet Take 1 tablet (81 mg total) by mouth daily. 30 tablet 11  . atenolol (TENORMIN) 50 MG tablet Take 12.5 mg by mouth daily. Take 1/4 tablet every day    . Calcium Carbonate-Vitamin D (CALCIUM-VITAMIN D) 500-200 MG-UNIT per tablet Take 1 tablet by mouth daily.    . clonazePAM (KLONOPIN) 1 MG tablet Take 1 mg by mouth at bedtime.     Marland Kitchen denosumab (XGEVA) 120 MG/1.7ML SOLN injection Inject 120 mg into the skin every 6 (six) weeks.    . fentaNYL (DURAGESIC - DOSED MCG/HR) 25 MCG/HR Place 1 patch onto the skin every 3 (three) days.    Marland Kitchen leuprolide (LUPRON) 22.5 MG injection Inject 22.5 mg into the muscle every 3 (three) months.    . levothyroxine (SYNTHROID, LEVOTHROID) 50 MCG tablet Take 1 tablet by mouth daily.    . nitroGLYCERIN (NITROSTAT) 0.4 MG SL tablet Place 1 tablet (0.4 mg total) under the tongue every 5 (five) minutes as needed for chest pain.  25 tablet 3  . oxyCODONE-acetaminophen (PERCOCET) 10-325 MG per tablet Take 1 tablet by mouth daily.     . pantoprazole (PROTONIX) 40 MG tablet Take 40 mg by mouth daily.    . polyethylene glycol (MIRALAX / GLYCOLAX) packet Take 17 g by mouth daily.    . polyethylene glycol powder (GLYCOLAX/MIRALAX) powder     . senna-docusate (EQ SENNA-S) 8.6-50 MG per tablet Take 2 tablets by mouth daily. 60 tablet 3  . simvastatin (ZOCOR) 40 MG tablet Take 40 mg by mouth at bedtime.      . ticagrelor (BRILINTA) 90 MG TABS tablet Take 1 tablet (90 mg total) by mouth 2 (two)  times daily. 60 tablet 11  . venlafaxine XR (EFFEXOR-XR) 150 MG 24 hr capsule Take 150 mg by mouth daily.    . vitamin B-12 (CYANOCOBALAMIN) 1000 MCG tablet Take 1,000 mcg by mouth daily.       No current facility-administered medications for this visit.    Facility-Administered Medications Ordered in Other Visits  Medication Dose Route Frequency Provider Last Rate Last Dose  . leuprolide (LUPRON) injection 22.5 mg  22.5 mg Intramuscular Once Wyatt Portela, MD         Allergies:  Allergies  Allergen Reactions  . Penicillins Other (See Comments)    Passed out  . Phenazopyridine Hcl Hives  . Ramipril Hives    Past Medical History, Surgical history, Social history, and Family History were reviewed and updated.   Physical Exam: Blood pressure 120/71, pulse 93, temperature 97.9 F (36.6 C), temperature source Oral, resp. rate 18, height 5\' 10"  (1.778 m), weight 136 lb 3.2 oz (61.8 kg), SpO2 100 %. ECOG: 1 General appearance: Well-appearing gentleman without distress. Head: Normocephalic, without obvious abnormality no oral ulcers or lesions. Neck: no adenopathy Lymph nodes: Cervical, supraclavicular, and axillary nodes normal. Heart:regular rate and rhythm, S1, S2 normal, no murmur, click, rub or gallop Lung:chest clear, no wheezing, rales, normal symmetric air entry Abdomin: soft, non-tender, without masses or organomegaly no shifting dullness or ascites. EXT:no erythema, induration, or nodules   Lab Results: Lab Results  Component Value Date   WBC 6.1 04/30/2016   HGB 12.0 (L) 04/30/2016   HCT 36.7 (L) 04/30/2016   MCV 90.1 04/30/2016   PLT 186 04/30/2016      Results for Russell Browning, Russell Browning (MRN [id number]) as of 01/29/2016 08:45  Ref. Range 07/11/2015 08:00 10/29/2015 07:49 01/22/2016 08:12  PSA Latest Ref Range: 0.0 - 4.0 ng/mL <0.1 0.1 0.1        Impression and Plan: 75 year old gentleman with the following issues:  1. Hormone sensitive prostate cancer he is  currently on Lupron with advanced disease to the bone.   Last PSA continues to be very low without any evidence to suggest castration resistant disease. The plan is to continue with Lupron with the same dose and schedule and and a second line hormone therapy if his PSA starts to rise. He continues to tolerate Lupron without any major complications.   2. Bone pain:  He continues to be on fentanyl patch without exacerbation of his pain.  3. Bone directed therapy: He is receiving this every 3 months for better tolerance. Complications associated with this therapy including hypocalcemia and osteonecrosis of the jaw were reviewed again and he is agreeable to continue.  4. Androgen depravation: He will continue Lupron 22.5 mg every 3 months and this will be given on 05/07/2016 and repeated on 08/13/2016.  5. Follow up on 08/06/2016  for lab and Xgeva. Lupron will be given on 08/13/2016.   [facility], MD 11/24/20179:00 AM

## 2016-05-11 DIAGNOSIS — F331 Major depressive disorder, recurrent, moderate: Secondary | ICD-10-CM | POA: Diagnosis not present

## 2016-05-11 DIAGNOSIS — I10 Essential (primary) hypertension: Secondary | ICD-10-CM | POA: Diagnosis not present

## 2016-05-11 DIAGNOSIS — D630 Anemia in neoplastic disease: Secondary | ICD-10-CM | POA: Diagnosis not present

## 2016-05-11 DIAGNOSIS — C61 Malignant neoplasm of prostate: Secondary | ICD-10-CM | POA: Diagnosis not present

## 2016-05-11 DIAGNOSIS — E782 Mixed hyperlipidemia: Secondary | ICD-10-CM | POA: Diagnosis not present

## 2016-05-11 DIAGNOSIS — G2581 Restless legs syndrome: Secondary | ICD-10-CM | POA: Diagnosis not present

## 2016-05-11 DIAGNOSIS — Z23 Encounter for immunization: Secondary | ICD-10-CM | POA: Diagnosis not present

## 2016-05-11 DIAGNOSIS — Z6820 Body mass index (BMI) 20.0-20.9, adult: Secondary | ICD-10-CM | POA: Diagnosis not present

## 2016-05-12 ENCOUNTER — Telehealth: Payer: Self-pay | Admitting: Cardiology

## 2016-05-12 ENCOUNTER — Encounter: Payer: Medicare Other | Admitting: *Deleted

## 2016-05-12 NOTE — Telephone Encounter (Signed)
Attempted to confirm remote transmission with pt. No answer and was unable to leave a message.   

## 2016-05-14 ENCOUNTER — Encounter: Payer: Self-pay | Admitting: Cardiology

## 2016-05-25 ENCOUNTER — Ambulatory Visit (INDEPENDENT_AMBULATORY_CARE_PROVIDER_SITE_OTHER): Payer: Medicare Other | Admitting: *Deleted

## 2016-05-25 DIAGNOSIS — I495 Sick sinus syndrome: Secondary | ICD-10-CM | POA: Diagnosis not present

## 2016-05-28 NOTE — Progress Notes (Signed)
Remote pacemaker transmission.   

## 2016-06-02 ENCOUNTER — Encounter: Payer: Self-pay | Admitting: Cardiology

## 2016-06-11 LAB — CUP PACEART REMOTE DEVICE CHECK
Battery Remaining Longevity: 33 mo
Battery Voltage: 2.74 V
Brady Statistic AS VS Percent: 12 %
Implantable Lead Implant Date: 19990408
Implantable Lead Location: 753859
Implantable Pulse Generator Implant Date: 20081126
Lead Channel Pacing Threshold Amplitude: 1.625 V
Lead Channel Pacing Threshold Pulse Width: 0.4 ms
Lead Channel Sensing Intrinsic Amplitude: 8 mV
Lead Channel Setting Pacing Amplitude: 2 V
Lead Channel Setting Sensing Sensitivity: 5.6 mV
MDC IDC LEAD IMPLANT DT: 19990408
MDC IDC LEAD LOCATION: 753860
MDC IDC LEAD MODEL: 4035
MDC IDC LEAD MODEL: 4524
MDC IDC LEAD SERIAL: 208202
MDC IDC MSMT BATTERY IMPEDANCE: 1770 Ohm
MDC IDC MSMT LEADCHNL RA IMPEDANCE VALUE: 144 Ohm
MDC IDC MSMT LEADCHNL RV IMPEDANCE VALUE: 1204 Ohm
MDC IDC SESS DTM: 20171212203131
MDC IDC SET LEADCHNL RV PACING AMPLITUDE: 2.5 V
MDC IDC SET LEADCHNL RV PACING PULSEWIDTH: 0.76 ms
MDC IDC STAT BRADY AP VP PERCENT: 0 %
MDC IDC STAT BRADY AP VS PERCENT: 88 %
MDC IDC STAT BRADY AS VP PERCENT: 0 %

## 2016-06-23 DIAGNOSIS — L280 Lichen simplex chronicus: Secondary | ICD-10-CM | POA: Diagnosis not present

## 2016-06-23 DIAGNOSIS — L570 Actinic keratosis: Secondary | ICD-10-CM | POA: Diagnosis not present

## 2016-06-23 DIAGNOSIS — Z85828 Personal history of other malignant neoplasm of skin: Secondary | ICD-10-CM | POA: Diagnosis not present

## 2016-08-06 ENCOUNTER — Ambulatory Visit (HOSPITAL_BASED_OUTPATIENT_CLINIC_OR_DEPARTMENT_OTHER): Payer: Medicare Other

## 2016-08-06 ENCOUNTER — Other Ambulatory Visit (HOSPITAL_BASED_OUTPATIENT_CLINIC_OR_DEPARTMENT_OTHER): Payer: Medicare Other

## 2016-08-06 VITALS — BP 121/81 | HR 90 | Temp 98.2°F | Resp 20

## 2016-08-06 DIAGNOSIS — I259 Chronic ischemic heart disease, unspecified: Secondary | ICD-10-CM

## 2016-08-06 DIAGNOSIS — C61 Malignant neoplasm of prostate: Secondary | ICD-10-CM | POA: Diagnosis not present

## 2016-08-06 DIAGNOSIS — I495 Sick sinus syndrome: Secondary | ICD-10-CM

## 2016-08-06 DIAGNOSIS — C7951 Secondary malignant neoplasm of bone: Secondary | ICD-10-CM

## 2016-08-06 DIAGNOSIS — Z950 Presence of cardiac pacemaker: Secondary | ICD-10-CM

## 2016-08-06 DIAGNOSIS — T82110D Breakdown (mechanical) of cardiac electrode, subsequent encounter: Secondary | ICD-10-CM

## 2016-08-06 DIAGNOSIS — I951 Orthostatic hypotension: Secondary | ICD-10-CM

## 2016-08-06 LAB — COMPREHENSIVE METABOLIC PANEL
ALT: 12 U/L (ref 0–55)
ANION GAP: 8 meq/L (ref 3–11)
AST: 16 U/L (ref 5–34)
Albumin: 4.1 g/dL (ref 3.5–5.0)
Alkaline Phosphatase: 24 U/L — ABNORMAL LOW (ref 40–150)
BUN: 13.8 mg/dL (ref 7.0–26.0)
CALCIUM: 9.6 mg/dL (ref 8.4–10.4)
CHLORIDE: 104 meq/L (ref 98–109)
CO2: 28 mEq/L (ref 22–29)
CREATININE: 1.1 mg/dL (ref 0.7–1.3)
EGFR: 69 mL/min/{1.73_m2} — AB (ref >90)
Glucose: 70 mg/dl (ref 70–140)
POTASSIUM: 3.9 meq/L (ref 3.5–5.1)
Sodium: 140 mEq/L (ref 136–145)
Total Bilirubin: 0.3 mg/dL (ref 0.20–1.20)
Total Protein: 6.5 g/dL (ref 6.4–8.3)

## 2016-08-06 LAB — CBC WITH DIFFERENTIAL/PLATELET
BASO%: 0.3 % (ref 0.0–2.0)
BASOS ABS: 0 10*3/uL (ref 0.0–0.1)
EOS%: 3.3 % (ref 0.0–7.0)
Eosinophils Absolute: 0.2 10*3/uL (ref 0.0–0.5)
HEMATOCRIT: 34.3 % — AB (ref 38.4–49.9)
HGB: 11.2 g/dL — ABNORMAL LOW (ref 13.0–17.1)
LYMPH#: 1.4 10*3/uL (ref 0.9–3.3)
LYMPH%: 23.6 % (ref 14.0–49.0)
MCH: 29.2 pg (ref 27.2–33.4)
MCHC: 32.7 g/dL (ref 32.0–36.0)
MCV: 89.3 fL (ref 79.3–98.0)
MONO#: 0.6 10*3/uL (ref 0.1–0.9)
MONO%: 10 % (ref 0.0–14.0)
NEUT#: 3.6 10*3/uL (ref 1.5–6.5)
NEUT%: 62.8 % (ref 39.0–75.0)
PLATELETS: 145 10*3/uL (ref 140–400)
RBC: 3.84 10*6/uL — ABNORMAL LOW (ref 4.20–5.82)
RDW: 13.4 % (ref 11.0–14.6)
WBC: 5.7 10*3/uL (ref 4.0–10.3)

## 2016-08-06 MED ORDER — DENOSUMAB 120 MG/1.7ML ~~LOC~~ SOLN
120.0000 mg | Freq: Once | SUBCUTANEOUS | Status: AC
  Administered 2016-08-06: 120 mg via SUBCUTANEOUS
  Filled 2016-08-06: qty 1.7

## 2016-08-06 NOTE — Patient Instructions (Signed)
Denosumab injection What is this medicine? DENOSUMAB (den oh sue mab) slows bone breakdown. Prolia is used to treat osteoporosis in women after menopause and in men. Xgeva is used to prevent bone fractures and other bone problems caused by cancer bone metastases. Xgeva is also used to treat giant cell tumor of the bone. COMMON BRAND NAME(S): Prolia, XGEVA What should I tell my health care provider before I take this medicine? They need to know if you have any of these conditions: -dental disease -eczema -infection or history of infections -kidney disease or on dialysis -low blood calcium or vitamin D -malabsorption syndrome -scheduled to have surgery or tooth extraction -taking medicine that contains denosumab -thyroid or parathyroid disease -an unusual reaction to denosumab, other medicines, foods, dyes, or preservatives -pregnant or trying to get pregnant -breast-feeding How should I use this medicine? This medicine is for injection under the skin. It is given by a health care professional in a hospital or clinic setting. If you are getting Prolia, a special MedGuide will be given to you by the pharmacist with each prescription and refill. Be sure to read this information carefully each time. For Prolia, talk to your pediatrician regarding the use of this medicine in children. Special care may be needed. For Xgeva, talk to your pediatrician regarding the use of this medicine in children. While this drug may be prescribed for children as young as 13 years for selected conditions, precautions do apply. What if I miss a dose? It is important not to miss your dose. Call your doctor or health care professional if you are unable to keep an appointment. What may interact with this medicine? Do not take this medicine with any of the following medications: -other medicines containing denosumab This medicine may also interact with the following medications: -medicines that suppress the immune  system -medicines that treat cancer -steroid medicines like prednisone or cortisone What should I watch for while using this medicine? Visit your doctor or health care professional for regular checks on your progress. Your doctor or health care professional may order blood tests and other tests to see how you are doing. Call your doctor or health care professional if you get a cold or other infection while receiving this medicine. Do not treat yourself. This medicine may decrease your body's ability to fight infection. You should make sure you get enough calcium and vitamin D while you are taking this medicine, unless your doctor tells you not to. Discuss the foods you eat and the vitamins you take with your health care professional. See your dentist regularly. Brush and floss your teeth as directed. Before you have any dental work done, tell your dentist you are receiving this medicine. Do not become pregnant while taking this medicine or for 5 months after stopping it. Women should inform their doctor if they wish to become pregnant or think they might be pregnant. There is a potential for serious side effects to an unborn child. Talk to your health care professional or pharmacist for more information. What side effects may I notice from receiving this medicine? Side effects that you should report to your doctor or health care professional as soon as possible: -allergic reactions like skin rash, itching or hives, swelling of the face, lips, or tongue -breathing problems -chest pain -fast, irregular heartbeat -feeling faint or lightheaded, falls -fever, chills, or any other sign of infection -muscle spasms, tightening, or twitches -numbness or tingling -skin blisters or bumps, or is dry, peels, or red -slow   healing or unexplained pain in the mouth or jaw -unusual bleeding or bruising Side effects that usually do not require medical attention (report to your doctor or health care professional  if they continue or are bothersome): -muscle pain -stomach upset, gas Where should I keep my medicine? This medicine is only given in a clinic, doctor's office, or other health care setting and will not be stored at home.  2017 [employer]vier/Gold Standard (2015-07-03 10:06:55)  

## 2016-08-07 LAB — PSA: PROSTATE SPECIFIC AG, SERUM: 0.1 ng/mL (ref 0.0–4.0)

## 2016-08-11 DIAGNOSIS — C61 Malignant neoplasm of prostate: Secondary | ICD-10-CM | POA: Diagnosis not present

## 2016-08-11 DIAGNOSIS — Z681 Body mass index (BMI) 19 or less, adult: Secondary | ICD-10-CM | POA: Diagnosis not present

## 2016-08-11 DIAGNOSIS — E782 Mixed hyperlipidemia: Secondary | ICD-10-CM | POA: Diagnosis not present

## 2016-08-11 DIAGNOSIS — Z1212 Encounter for screening for malignant neoplasm of rectum: Secondary | ICD-10-CM | POA: Diagnosis not present

## 2016-08-11 DIAGNOSIS — G2581 Restless legs syndrome: Secondary | ICD-10-CM | POA: Diagnosis not present

## 2016-08-11 DIAGNOSIS — K219 Gastro-esophageal reflux disease without esophagitis: Secondary | ICD-10-CM | POA: Diagnosis not present

## 2016-08-11 DIAGNOSIS — Z23 Encounter for immunization: Secondary | ICD-10-CM | POA: Diagnosis not present

## 2016-08-11 DIAGNOSIS — D630 Anemia in neoplastic disease: Secondary | ICD-10-CM | POA: Diagnosis not present

## 2016-08-11 DIAGNOSIS — I10 Essential (primary) hypertension: Secondary | ICD-10-CM | POA: Diagnosis not present

## 2016-08-11 DIAGNOSIS — F331 Major depressive disorder, recurrent, moderate: Secondary | ICD-10-CM | POA: Diagnosis not present

## 2016-08-11 DIAGNOSIS — M545 Low back pain: Secondary | ICD-10-CM | POA: Diagnosis not present

## 2016-08-11 DIAGNOSIS — I2510 Atherosclerotic heart disease of native coronary artery without angina pectoris: Secondary | ICD-10-CM | POA: Diagnosis not present

## 2016-08-13 ENCOUNTER — Telehealth: Payer: Self-pay | Admitting: Oncology

## 2016-08-13 ENCOUNTER — Ambulatory Visit (HOSPITAL_BASED_OUTPATIENT_CLINIC_OR_DEPARTMENT_OTHER): Payer: Medicare Other

## 2016-08-13 ENCOUNTER — Ambulatory Visit (HOSPITAL_BASED_OUTPATIENT_CLINIC_OR_DEPARTMENT_OTHER): Payer: Medicare Other | Admitting: Oncology

## 2016-08-13 VITALS — BP 109/74 | HR 92 | Temp 98.2°F | Resp 18 | Ht 70.0 in | Wt 131.6 lb

## 2016-08-13 DIAGNOSIS — Z5111 Encounter for antineoplastic chemotherapy: Secondary | ICD-10-CM | POA: Diagnosis present

## 2016-08-13 DIAGNOSIS — C61 Malignant neoplasm of prostate: Secondary | ICD-10-CM | POA: Diagnosis not present

## 2016-08-13 DIAGNOSIS — C7951 Secondary malignant neoplasm of bone: Secondary | ICD-10-CM

## 2016-08-13 DIAGNOSIS — E291 Testicular hypofunction: Secondary | ICD-10-CM | POA: Diagnosis not present

## 2016-08-13 MED ORDER — LEUPROLIDE ACETATE (3 MONTH) 22.5 MG IM KIT
22.5000 mg | PACK | Freq: Once | INTRAMUSCULAR | Status: AC
  Administered 2016-08-13: 22.5 mg via INTRAMUSCULAR
  Filled 2016-08-13: qty 22.5

## 2016-08-13 NOTE — Telephone Encounter (Signed)
Appointments scheduled per 3/2 LOS. Patient given AVS Report and calendars with future scheduled appointments.

## 2016-08-13 NOTE — Progress Notes (Signed)
Hematology and Oncology Follow Up Visit  [name] [id number] June 04, 1941 76 y.o. 08/13/2016 8:43 AM    Principle Diagnosis:  76 year old gentleman with  advanced prostate cancer. He has metastatic disease to the bone. It appears to be hormone sensitive at this time. His cancer was inially diagnosed in 2000.    Prior Therapy: 1. He received radiation therapy and 2 years of Lupron, but it presumably was locally advanced prostate cancer at the time of diagnosis in 2000.  2. PSA was under control from 2001 up until the early parts of 2012, where his PSA rose up to 16.9. At that time, he was under the care of Dr. 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 He subsequently started on  Lupron every 3 months.  3. He is S/P radiation therapy directed to the right hip (30 gray in 12 fractions) concluded in 07/2011 for palliative purposes conducted in [place].    Current therapy:  1. Lupron 22.5 mg every three months.  2. Delton See given every 3 months started in 10/2011.   Interim History:  Mr. Russell Browning presents today for a follow up visit. Since his last visit, he continues to do well. He denied any recent changes in his performance status, appetite and quality of life. He continues to use fentanyl patch with very little breakthrough pain medication. He continues to tolerate Lupron and Xgeva without any side effects. He does report hot flashes but no other complications. He denied recent bone pain or pathological fractures.   He does not report any headaches, blurry vision, or seizures. He does not report any fevers or chills or sweats. He does not report any cough, wheezing or hemoptysis. Is not reporting any chest pain or difficulty breathing. Does not report any dyspnea on exertion or leg edema. Has not reported any nausea or vomiting. Has not reported any palpitation or leg edema. He is not reporting any change in his  bowel habits. Has not reported any urinary symptoms. Remainder of his review of systems unremarkable.   Medications: I have reviewed the patient's current medications.   Current Outpatient Prescriptions  Medication Sig Dispense Refill  . aspirin EC 81 MG EC tablet Take 1 tablet (81 mg total) by mouth daily. 30 tablet 11  . atenolol (TENORMIN) 50 MG tablet Take 12.5 mg by mouth daily. Take 1/4 tablet every day    . Calcium Carbonate-Vitamin D (CALCIUM-VITAMIN D) 500-200 MG-UNIT per tablet Take 1 tablet by mouth daily.    . clonazePAM (KLONOPIN) 1 MG tablet Take 1 mg by mouth at bedtime.     Marland Kitchen denosumab (XGEVA) 120 MG/1.7ML SOLN injection Inject 120 mg into the skin every 6 (six) weeks.    . fentaNYL (DURAGESIC - DOSED MCG/HR) 25 MCG/HR Place 1 patch onto the skin every 3 (three) days.    Marland Kitchen leuprolide (LUPRON) 22.5 MG injection Inject 22.5 mg into the muscle every 3 (three) months.    . levothyroxine (SYNTHROID, LEVOTHROID) 50 MCG tablet Take 1 tablet by mouth daily.    Marland Kitchen oxyCODONE-acetaminophen (PERCOCET) 10-325 MG per tablet Take 1 tablet by mouth daily.     . pantoprazole (PROTONIX) 40 MG tablet Take 40 mg by mouth daily.    . polyethylene glycol (MIRALAX / GLYCOLAX) packet Take 17 g by mouth daily.    . polyethylene glycol powder (GLYCOLAX/MIRALAX) powder     . senna-docusate (EQ SENNA-S)  8.6-50 MG per tablet Take 2 tablets by mouth daily. 60 tablet 3  . simvastatin (ZOCOR) 40 MG tablet Take 40 mg by mouth at bedtime.      . ticagrelor (BRILINTA) 90 MG TABS tablet Take 1 tablet (90 mg total) by mouth 2 (two) times daily. 60 tablet 11  . venlafaxine XR (EFFEXOR-XR) 150 MG 24 hr capsule Take 150 mg by mouth daily.    . vitamin B-12 (CYANOCOBALAMIN) 1000 MCG tablet Take 1,000 mcg by mouth daily.      . nitroGLYCERIN (NITROSTAT) 0.4 MG SL tablet Place 1 tablet (0.4 mg total) under the tongue every 5 (five) minutes as needed for chest pain. (Patient not taking: Reported on 08/13/2016) 25 tablet 3    No current facility-administered medications for this visit.      Allergies:  Allergies  Allergen Reactions  . Penicillins Other (See Comments)    Passed out  . Phenazopyridine Hcl Hives  . Ramipril Hives    Past Medical History, Surgical history, Social history, and Family History were reviewed and updated.   Physical Exam: Blood pressure 109/74, pulse 92, temperature 98.2 F (36.8 C), temperature source Oral, resp. rate 18, height 5\' 10"  (1.778 m), weight 131 lb 9.6 oz (59.7 kg), SpO2 99 %. ECOG: 1 General appearance: Alert, awake gentleman without distress. Head: Normocephalic, without obvious abnormality no ulcers or thrush. Neck: no adenopathy thyroid masses. Lymph nodes: Cervical, supraclavicular, and axillary nodes normal. Heart:regular rate and rhythm, S1, S2 normal, no murmur, click, rub or gallop Lung:chest clear, no wheezing, rales, normal symmetric air entry Abdomin: soft, non-tender, without masses or organomegaly no rebound or guarding. EXT:no erythema, induration, or nodules   Lab Results: Lab Results  Component Value Date   WBC 5.7 08/06/2016   HGB 11.2 (L) 08/06/2016   HCT 34.3 (L) 08/06/2016   MCV 89.3 08/06/2016   PLT 145 08/06/2016      Results for Russell Browning (MRN [id number]) as of 08/13/2016 08:28  Ref. Range 01/22/2016 08:12 04/30/2016 07:56 08/06/2016 08:28  PSA Latest Ref Range: 0.0 - 4.0 ng/mL 0.1 0.1 0.1         Impression and Plan: 76 year old gentleman with the following issues:  1. Hormone sensitive prostate cancer he is currently on Lupron with advanced disease to the bone.   His PSA continues to be under excellent control without any major changes in his clinical status. I have recommended continuing Lupron for the time being and adding different hormone therapy if he develops rise in his PSA.   2. Bone pain:  He continues to be on fentanyl patch without exacerbation of his pain. This is prescribed by his primary care  physician and had not required any additional pain medication.  3. Bone directed therapy: He is receiving this every 3 months for better tolerance. Complications associated with this therapy including hypocalcemia and osteonecrosis of the jaw were reviewed again and he is no objections to continue.  4. Androgen depravation: He will continue Lupron 22.5 mg every 3 months and this will be given on 08/13/2016 and repeated on 11/25/2016.  5. Follow up on 11/18/2016 for laboratory testing and Xgeva. He will have an M.D. follow-up and Lupron on June 14.   [facility], MD 3/2/20188:43 AM

## 2016-08-13 NOTE — Patient Instructions (Signed)
Leuprolide depot injection What is this medicine? LEUPROLIDE (loo PROE lide) is a man-made protein that acts like a natural hormone in the body. It decreases testosterone in men and decreases estrogen in women. In men, this medicine is used to treat advanced prostate cancer. In women, some forms of this medicine may be used to treat endometriosis, uterine fibroids, or other male hormone-related problems. This medicine may be used for other purposes; ask your health care provider or pharmacist if you have questions. COMMON BRAND NAME(S): Eligard, Lupron Depot, Lupron Depot-Ped, Viadur What should I tell my health care provider before I take this medicine? They need to know if you have any of these conditions: -diabetes -heart disease or previous heart attack -high blood pressure -high cholesterol -mental illness -osteoporosis -pain or difficulty passing urine -seizures -spinal cord metastasis -stroke -suicidal thoughts, plans, or attempt; a previous suicide attempt by you or a family member -tobacco smoker -unusual vaginal bleeding (women) -an unusual or allergic reaction to leuprolide, benzyl alcohol, other medicines, foods, dyes, or preservatives -pregnant or trying to get pregnant -breast-feeding How should I use this medicine? This medicine is for injection into a muscle or for injection under the skin. It is given by a health care professional in a hospital or clinic setting. The specific product will determine how it will be given to you. Make sure you understand which product you receive and how often you will receive it. Talk to your pediatrician regarding the use of this medicine in children. Special care may be needed. Overdosage: If you think you have taken too much of this medicine contact a poison control center or emergency room at once. NOTE: This medicine is only for you. Do not share this medicine with others. What if I miss a dose? It is important not to miss a dose.  Call your doctor or health care professional if you are unable to keep an appointment. Depot injections: Depot injections are given either once-monthly, every 12 weeks, every 16 weeks, or every 24 weeks depending on the product you are prescribed. The product you are prescribed will be based on if you are male or male, and your condition. Make sure you understand your product and dosing. What may interact with this medicine? Do not take this medicine with any of the following medications: -chasteberry This medicine may also interact with the following medications: -herbal or dietary supplements, like black cohosh or DHEA -male hormones, like estrogens or progestins and birth control pills, patches, rings, or injections -male hormones, like testosterone This list may not describe all possible interactions. Give your health care provider a list of all the medicines, herbs, non-prescription drugs, or dietary supplements you use. Also tell them if you smoke, drink alcohol, or use illegal drugs. Some items may interact with your medicine. What should I watch for while using this medicine? Visit your doctor or health care professional for regular checks on your progress. During the first weeks of treatment, your symptoms may get worse, but then will improve as you continue your treatment. You may get hot flashes, increased bone pain, increased difficulty passing urine, or an aggravation of nerve symptoms. Discuss these effects with your doctor or health care professional, some of them may improve with continued use of this medicine. Male patients may experience a menstrual cycle or spotting during the first months of therapy with this medicine. If this continues, contact your doctor or health care professional. What side effects may I notice from receiving this medicine? Side   effects that you should report to your doctor or health care professional as soon as possible: -allergic reactions like skin  rash, itching or hives, swelling of the face, lips, or tongue -breathing problems -chest pain -depression or memory disorders -pain in your legs or groin -pain at site where injected or implanted -seizures -severe headache -swelling of the feet and legs -suicidal thoughts or other mood changes -visual changes -vomiting Side effects that usually do not require medical attention (report to your doctor or health care professional if they continue or are bothersome): -breast swelling or tenderness -decrease in sex drive or performance -diarrhea -hot flashes -loss of appetite -muscle, joint, or bone pains -nausea -redness or irritation at site where injected or implanted -skin problems or acne This list may not describe all possible side effects. Call your doctor for medical advice about side effects. You may report side effects to FDA at 1-800-FDA-1088. Where should I keep my medicine? This drug is given in a hospital or clinic and will not be stored at home. NOTE: This sheet is a summary. It may not cover all possible information. If you have questions about this medicine, talk to your doctor, pharmacist, or health care provider.  2018 Elsevier/Gold Standard (2015-11-13 09:45:53)  

## 2016-08-30 ENCOUNTER — Telehealth: Payer: Self-pay | Admitting: Cardiology

## 2016-08-30 ENCOUNTER — Ambulatory Visit (INDEPENDENT_AMBULATORY_CARE_PROVIDER_SITE_OTHER): Payer: Medicare Other | Admitting: *Deleted

## 2016-08-30 DIAGNOSIS — I495 Sick sinus syndrome: Secondary | ICD-10-CM

## 2016-08-30 NOTE — Telephone Encounter (Signed)
Spoke with pt and reminded pt of remote transmission that is due today. Pt verbalized understanding.   

## 2016-08-31 NOTE — Progress Notes (Signed)
Remote pacemaker transmission.   

## 2016-09-01 ENCOUNTER — Encounter: Payer: Self-pay | Admitting: Cardiology

## 2016-09-01 LAB — CUP PACEART REMOTE DEVICE CHECK
Brady Statistic AP VP Percent: 0 %
Brady Statistic AP VS Percent: 89 %
Brady Statistic AS VS Percent: 11 %
Date Time Interrogation Session: 20180319180804
Implantable Lead Implant Date: 19990408
Implantable Lead Location: 753860
Implantable Lead Model: 4035
Implantable Lead Model: 4524
Implantable Pulse Generator Implant Date: 20081126
Lead Channel Impedance Value: 1138 Ohm
Lead Channel Impedance Value: 143 Ohm
Lead Channel Pacing Threshold Amplitude: 1.625 V
Lead Channel Setting Pacing Amplitude: 2.5 V
MDC IDC LEAD IMPLANT DT: 19990408
MDC IDC LEAD LOCATION: 753859
MDC IDC LEAD SERIAL: 208202
MDC IDC MSMT BATTERY IMPEDANCE: 1956 Ohm
MDC IDC MSMT BATTERY REMAINING LONGEVITY: 30 mo
MDC IDC MSMT BATTERY VOLTAGE: 2.73 V
MDC IDC MSMT LEADCHNL RV PACING THRESHOLD PULSEWIDTH: 0.4 ms
MDC IDC SET LEADCHNL RA PACING AMPLITUDE: 2 V
MDC IDC SET LEADCHNL RV PACING PULSEWIDTH: 0.76 ms
MDC IDC SET LEADCHNL RV SENSING SENSITIVITY: 5.6 mV
MDC IDC STAT BRADY AS VP PERCENT: 0 %

## 2016-09-09 DIAGNOSIS — J0101 Acute recurrent maxillary sinusitis: Secondary | ICD-10-CM | POA: Diagnosis not present

## 2016-09-09 DIAGNOSIS — J069 Acute upper respiratory infection, unspecified: Secondary | ICD-10-CM | POA: Diagnosis not present

## 2016-09-09 DIAGNOSIS — Z6820 Body mass index (BMI) 20.0-20.9, adult: Secondary | ICD-10-CM | POA: Diagnosis not present

## 2016-09-15 ENCOUNTER — Encounter: Payer: Self-pay | Admitting: *Deleted

## 2016-11-01 NOTE — Progress Notes (Signed)
Cardiology Office Note  Date: 11/02/2016   ID: Russell Browning, Russell Browning Sep 26, 1940, MRN [id number]  PCP: Caryl Bis, MD  Primary Cardiologist: Rozann Lesches, MD   Chief Complaint  Patient presents with  . Coronary Artery Disease    History of Present Illness: Russell Browning is a 76 y.o. male last seen in November 2017. He presents for a routine follow-up visit. Reports no angina symptoms or nitroglycerin use. Describes chronic fatigue but attributes this to treatment for metastatic prostate cancer.  He continues to follow in the device clinic with Dr. Rayann Heman, Medtronic pacemaker in place. He has had no sudden dizziness or syncope.  Patient continues on long-term DAPT with history of DES to the RCA and subsequent angioplasty as outlined below. Current medical regimen includes aspirin, Brilinta, atenolol, Zocor, and as needed nitroglycerin.  Past Medical History:  Diagnosis Date  . Anemia   . CAD (coronary artery disease), native coronary artery    a. DES to proximal, mid, and distal RCA 2006. b. 12/2014 - [place] s/p  PTCA to the mid-distal previously placed RCA stents, otherwise nonobstructive CAD for continued medical therapy.  Marland Kitchen History of TIAs   . Mixed hyperlipidemia   . 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 Prostate cancer metastatic to bone ([place])   . Sinus node dysfunction [facility])    Medtronic pacemaker - Dr. Rayann Heman    Past Surgical History:  Procedure Laterality Date  . CARDIAC CATHETERIZATION N/A 01/06/2015   Procedure: Left Heart Cath and Coronary Angiography;  Surgeon: Troy Sine, MD;  Location: [place] [facility];  Service: Cardiovascular;  Laterality: N/A;  . PACEMAKER INSERTION     MDT    Current Outpatient Prescriptions  Medication Sig Dispense Refill  . aspirin EC 81 MG EC tablet Take 1 tablet (81 mg total) by mouth daily. 30 tablet 11  .  atenolol (TENORMIN) 50 MG tablet Take 12.5 mg by mouth daily. Take 1/4 tablet every day    . Calcium Carbonate-Vitamin D (CALCIUM-VITAMIN D) 500-200 MG-UNIT per tablet Take 1 tablet by mouth daily.    . clonazePAM (KLONOPIN) 1 MG tablet Take 1 mg by mouth at bedtime.     Marland Kitchen denosumab (XGEVA) 120 MG/1.7ML SOLN injection Inject 120 mg into the skin every 6 (six) weeks.    . fentaNYL (DURAGESIC - DOSED MCG/HR) 25 MCG/HR Place 1 patch onto the skin every 3 (three) days.    Marland Kitchen leuprolide (LUPRON) 22.5 MG injection Inject 22.5 mg into the muscle every 3 (three) months.    . levothyroxine (SYNTHROID, LEVOTHROID) 50 MCG tablet Take 1 tablet by mouth daily.    . nitroGLYCERIN (NITROSTAT) 0.4 MG SL tablet Place 1 tablet (0.4 mg total) under the tongue every 5 (five) minutes as needed for chest pain. 25 tablet 3  . oxyCODONE-acetaminophen (PERCOCET) 10-325 MG per tablet Take 1 tablet by mouth daily.     . pantoprazole (PROTONIX) 40 MG tablet Take 40 mg by mouth daily.    . polyethylene glycol (MIRALAX / GLYCOLAX) packet Take 17 g by mouth daily.    . polyethylene glycol powder (GLYCOLAX/MIRALAX) powder     . senna-docusate (EQ SENNA-S) 8.6-50 MG per tablet Take 2 tablets by mouth daily. 60 tablet 3  . simvastatin (ZOCOR) 40 MG tablet Take 40 mg by mouth at bedtime.      Marland Kitchen  ticagrelor (BRILINTA) 90 MG TABS tablet Take 1 tablet (90 mg total) by mouth 2 (two) times daily. 60 tablet 11  . venlafaxine XR (EFFEXOR-XR) 150 MG 24 hr capsule Take 150 mg by mouth daily.    . vitamin B-12 (CYANOCOBALAMIN) 1000 MCG tablet Take 1,000 mcg by mouth daily.       No current facility-administered medications for this visit.    Allergies:  Penicillins; Phenazopyridine hcl; and Ramipril   Social History: The patient  reports that he quit smoking about 51 years ago. His smoking use included Cigarettes. He started smoking about 65 years ago. He has a 5.00 pack-year smoking history. He quit smokeless tobacco use about 38 years  ago. His smokeless tobacco use included Chew. He reports that he does not drink alcohol or use drugs.   ROS:  Please see the history of present illness. Otherwise, complete review of systems is positive for chronic fatigue.  All other systems are reviewed and negative.   Physical Exam: VS:  BP 113/66   Pulse 75   Ht 5\' 10"  (1.778 m)   Wt 135 lb 6.4 oz (61.4 kg)   SpO2 100%   BMI 19.43 kg/m , BMI Body mass index is 19.43 kg/m.  Wt Readings from Last 3 Encounters:  11/02/16 135 lb 6.4 oz (61.4 kg)  08/13/16 131 lb 9.6 oz (59.7 kg)  05/07/16 136 lb 3.2 oz (61.8 kg)    General: Thin male, appears comfortable at rest. HEENT: Conjunctiva and lids normal, oropharynx clear. Neck: Supple, no elevated JVP or carotid bruits, no thyromegaly. Lungs: Clear to auscultation, nonlabored breathing at rest. Cardiac: Regular rate and rhythm, no S3 or significant systolic murmur, very soft diastolic murmur, no pericardial rub. Abdomen: Soft, nontender, bowel sounds present. Extremities: No pitting edema, distal pulses 2+. Skin: Warm and dry. Musculoskeletal: No kyphosis. Neuropsychiatric: Alert and oriented x3, affect grossly appropriate.  ECG: I personally reviewed the tracing from 04/19/2016 which showed an atrial paced rhythm with nonspecific ST changes.  Recent Labwork: 08/06/2016: ALT 12; AST 16; BUN 13.8; Creatinine 1.1; HGB 11.2; Platelets 145; Potassium 3.9; Sodium 140     Component Value Date/Time   CHOL 171 01/04/2015 0431   TRIG 106 01/04/2015 0431   HDL 42 01/04/2015 0431   CHOLHDL 4.1 01/04/2015 0431   VLDL 21 01/04/2015 0431   LDLCALC 108 (H) 01/04/2015 0431    Other Studies Reviewed Today:  Echocardiogram 04/09/2015: Study Conclusions  - Left ventricle: The cavity size was normal. Wall thickness was  normal. Systolic function was normal. The estimated ejection  fraction was in the range of 55% to 60%. Indeterminate diastolic  function. Wall motion was normal; there  were no regional wall  motion abnormalities. - Aortic valve: Mildly calcified annulus. Mildly thickened  leaflets. There was mild to moderate regurgitation. The AI VC is  0.3 cm. Valve area (VTI): 3.02 cm^2. Valve area (Vmax): 2.89  cm^2. Valve area (Vmean): 2.98 cm^2. Regurgitation pressure  half-time: 568 ms. - Mitral valve: There was mild regurgitation. - Left atrium: The atrium was moderately dilated. - Systemic veins: IVC is small suggesting low RA pressure and  hypovolemia. - Technically difficult study.  Assessment and Plan:  1. CAD status post recurrent RCA interventions, most recently angioplasty of the mid to distal RCA in 2016. He remains clinically stable without angina on medical therapy and also remains on DAPT.  2. Sinus node dysfunction status post Medtronic pacemaker, followed by Dr. Rayann Heman.  3. Hyperlipidemia on Zocor. He  continues to follow with Dr. Quillian Quince.  4. Mild to moderate aortic regurgitation, asymptomatic.  5. History of TIAs without recent recurrence. He continues on DAPT and statin.  Current medicines were reviewed with the patient today.  Disposition: Follow-up in 6 months.  Signed, Satira Sark, MD, [facility] 11/02/2016 10:54 AM    [facility] [place], [place], [place] [address] Phone: [phone]; Fax: [phone]

## 2016-11-02 ENCOUNTER — Encounter: Payer: Self-pay | Admitting: Cardiology

## 2016-11-02 ENCOUNTER — Ambulatory Visit (INDEPENDENT_AMBULATORY_CARE_PROVIDER_SITE_OTHER): Payer: Medicare Other | Admitting: Cardiology

## 2016-11-02 VITALS — BP 113/66 | HR 75 | Ht 70.0 in | Wt 135.4 lb

## 2016-11-02 DIAGNOSIS — E782 Mixed hyperlipidemia: Secondary | ICD-10-CM | POA: Diagnosis not present

## 2016-11-02 DIAGNOSIS — I495 Sick sinus syndrome: Secondary | ICD-10-CM | POA: Diagnosis not present

## 2016-11-02 DIAGNOSIS — Z8673 Personal history of transient ischemic attack (TIA), and cerebral infarction without residual deficits: Secondary | ICD-10-CM | POA: Diagnosis not present

## 2016-11-02 DIAGNOSIS — I2510 Atherosclerotic heart disease of native coronary artery without angina pectoris: Secondary | ICD-10-CM

## 2016-11-02 DIAGNOSIS — I259 Chronic ischemic heart disease, unspecified: Secondary | ICD-10-CM | POA: Diagnosis not present

## 2016-11-02 DIAGNOSIS — I351 Nonrheumatic aortic (valve) insufficiency: Secondary | ICD-10-CM

## 2016-11-02 NOTE — Patient Instructions (Signed)

## 2016-11-15 DIAGNOSIS — K121 Other forms of stomatitis: Secondary | ICD-10-CM | POA: Diagnosis not present

## 2016-11-15 DIAGNOSIS — K210 Gastro-esophageal reflux disease with esophagitis: Secondary | ICD-10-CM | POA: Diagnosis not present

## 2016-11-15 DIAGNOSIS — I10 Essential (primary) hypertension: Secondary | ICD-10-CM | POA: Diagnosis not present

## 2016-11-15 DIAGNOSIS — F325 Major depressive disorder, single episode, in full remission: Secondary | ICD-10-CM | POA: Diagnosis not present

## 2016-11-15 DIAGNOSIS — Z9189 Other specified personal risk factors, not elsewhere classified: Secondary | ICD-10-CM | POA: Diagnosis not present

## 2016-11-15 DIAGNOSIS — J029 Acute pharyngitis, unspecified: Secondary | ICD-10-CM | POA: Diagnosis not present

## 2016-11-15 DIAGNOSIS — E782 Mixed hyperlipidemia: Secondary | ICD-10-CM | POA: Diagnosis not present

## 2016-11-15 DIAGNOSIS — R5383 Other fatigue: Secondary | ICD-10-CM | POA: Diagnosis not present

## 2016-11-17 DIAGNOSIS — D630 Anemia in neoplastic disease: Secondary | ICD-10-CM | POA: Diagnosis not present

## 2016-11-17 DIAGNOSIS — E782 Mixed hyperlipidemia: Secondary | ICD-10-CM | POA: Diagnosis not present

## 2016-11-17 DIAGNOSIS — G2581 Restless legs syndrome: Secondary | ICD-10-CM | POA: Diagnosis not present

## 2016-11-17 DIAGNOSIS — C61 Malignant neoplasm of prostate: Secondary | ICD-10-CM | POA: Diagnosis not present

## 2016-11-17 DIAGNOSIS — Z6820 Body mass index (BMI) 20.0-20.9, adult: Secondary | ICD-10-CM | POA: Diagnosis not present

## 2016-11-17 DIAGNOSIS — M545 Low back pain: Secondary | ICD-10-CM | POA: Diagnosis not present

## 2016-11-17 DIAGNOSIS — I10 Essential (primary) hypertension: Secondary | ICD-10-CM | POA: Diagnosis not present

## 2016-11-17 DIAGNOSIS — F331 Major depressive disorder, recurrent, moderate: Secondary | ICD-10-CM | POA: Diagnosis not present

## 2016-11-18 ENCOUNTER — Ambulatory Visit (HOSPITAL_BASED_OUTPATIENT_CLINIC_OR_DEPARTMENT_OTHER): Payer: Medicare Other

## 2016-11-18 ENCOUNTER — Other Ambulatory Visit (HOSPITAL_BASED_OUTPATIENT_CLINIC_OR_DEPARTMENT_OTHER): Payer: Medicare Other

## 2016-11-18 DIAGNOSIS — I495 Sick sinus syndrome: Secondary | ICD-10-CM

## 2016-11-18 DIAGNOSIS — C61 Malignant neoplasm of prostate: Secondary | ICD-10-CM

## 2016-11-18 DIAGNOSIS — I259 Chronic ischemic heart disease, unspecified: Secondary | ICD-10-CM

## 2016-11-18 DIAGNOSIS — C7951 Secondary malignant neoplasm of bone: Secondary | ICD-10-CM | POA: Diagnosis not present

## 2016-11-18 DIAGNOSIS — Z950 Presence of cardiac pacemaker: Secondary | ICD-10-CM

## 2016-11-18 DIAGNOSIS — G459 Transient cerebral ischemic attack, unspecified: Secondary | ICD-10-CM

## 2016-11-18 DIAGNOSIS — I951 Orthostatic hypotension: Secondary | ICD-10-CM

## 2016-11-18 DIAGNOSIS — T82110D Breakdown (mechanical) of cardiac electrode, subsequent encounter: Secondary | ICD-10-CM

## 2016-11-18 LAB — COMPREHENSIVE METABOLIC PANEL
ALT: 12 U/L (ref 0–55)
ANION GAP: 10 meq/L (ref 3–11)
AST: 17 U/L (ref 5–34)
Albumin: 4.2 g/dL (ref 3.5–5.0)
Alkaline Phosphatase: 20 U/L — ABNORMAL LOW (ref 40–150)
BILIRUBIN TOTAL: 0.24 mg/dL (ref 0.20–1.20)
BUN: 13.6 mg/dL (ref 7.0–26.0)
CHLORIDE: 104 meq/L (ref 98–109)
CO2: 29 meq/L (ref 22–29)
CREATININE: 1 mg/dL (ref 0.7–1.3)
Calcium: 9.7 mg/dL (ref 8.4–10.4)
EGFR: 73 mL/min/{1.73_m2} — ABNORMAL LOW (ref >90)
GLUCOSE: 84 mg/dL (ref 70–140)
Potassium: 4.6 mEq/L (ref 3.5–5.1)
Sodium: 142 mEq/L (ref 136–145)
TOTAL PROTEIN: 6.8 g/dL (ref 6.4–8.3)

## 2016-11-18 LAB — CBC WITH DIFFERENTIAL/PLATELET
BASO%: 0.5 % (ref 0.0–2.0)
Basophils Absolute: 0 10*3/uL (ref 0.0–0.1)
EOS%: 4.2 % (ref 0.0–7.0)
Eosinophils Absolute: 0.3 10*3/uL (ref 0.0–0.5)
HCT: 35.2 % — ABNORMAL LOW (ref 38.4–49.9)
HGB: 11.9 g/dL — ABNORMAL LOW (ref 13.0–17.1)
LYMPH#: 1.4 10*3/uL (ref 0.9–3.3)
LYMPH%: 23 % (ref 14.0–49.0)
MCH: 30.3 pg (ref 27.2–33.4)
MCHC: 33.7 g/dL (ref 32.0–36.0)
MCV: 90 fL (ref 79.3–98.0)
MONO#: 0.6 10*3/uL (ref 0.1–0.9)
MONO%: 9.6 % (ref 0.0–14.0)
NEUT%: 62.7 % (ref 39.0–75.0)
NEUTROS ABS: 3.7 10*3/uL (ref 1.5–6.5)
PLATELETS: 184 10*3/uL (ref 140–400)
RBC: 3.91 10*6/uL — AB (ref 4.20–5.82)
RDW: 14.6 % (ref 11.0–14.6)
WBC: 5.9 10*3/uL (ref 4.0–10.3)

## 2016-11-18 MED ORDER — DENOSUMAB 120 MG/1.7ML ~~LOC~~ SOLN
120.0000 mg | Freq: Once | SUBCUTANEOUS | Status: AC
  Administered 2016-11-18: 120 mg via SUBCUTANEOUS
  Filled 2016-11-18: qty 1.7

## 2016-11-18 NOTE — Patient Instructions (Signed)
Denosumab injection What is this medicine? DENOSUMAB (den oh sue mab) slows bone breakdown. Prolia is used to treat osteoporosis in women after menopause and in men. Xgeva is used to prevent bone fractures and other bone problems caused by cancer bone metastases. Xgeva is also used to treat giant cell tumor of the bone. COMMON BRAND NAME(S): Prolia, XGEVA What should I tell my health care provider before I take this medicine? They need to know if you have any of these conditions: -dental disease -eczema -infection or history of infections -kidney disease or on dialysis -low blood calcium or vitamin D -malabsorption syndrome -scheduled to have surgery or tooth extraction -taking medicine that contains denosumab -thyroid or parathyroid disease -an unusual reaction to denosumab, other medicines, foods, dyes, or preservatives -pregnant or trying to get pregnant -breast-feeding How should I use this medicine? This medicine is for injection under the skin. It is given by a health care professional in a hospital or clinic setting. If you are getting Prolia, a special MedGuide will be given to you by the pharmacist with each prescription and refill. Be sure to read this information carefully each time. For Prolia, talk to your pediatrician regarding the use of this medicine in children. Special care may be needed. For Xgeva, talk to your pediatrician regarding the use of this medicine in children. While this drug may be prescribed for children as young as 13 years for selected conditions, precautions do apply. What if I miss a dose? It is important not to miss your dose. Call your doctor or health care professional if you are unable to keep an appointment. What may interact with this medicine? Do not take this medicine with any of the following medications: -other medicines containing denosumab This medicine may also interact with the following medications: -medicines that suppress the immune  system -medicines that treat cancer -steroid medicines like prednisone or cortisone What should I watch for while using this medicine? Visit your doctor or health care professional for regular checks on your progress. Your doctor or health care professional may order blood tests and other tests to see how you are doing. Call your doctor or health care professional if you get a cold or other infection while receiving this medicine. Do not treat yourself. This medicine may decrease your body's ability to fight infection. You should make sure you get enough calcium and vitamin D while you are taking this medicine, unless your doctor tells you not to. Discuss the foods you eat and the vitamins you take with your health care professional. See your dentist regularly. Brush and floss your teeth as directed. Before you have any dental work done, tell your dentist you are receiving this medicine. Do not become pregnant while taking this medicine or for 5 months after stopping it. Women should inform their doctor if they wish to become pregnant or think they might be pregnant. There is a potential for serious side effects to an unborn child. Talk to your health care professional or pharmacist for more information. What side effects may I notice from receiving this medicine? Side effects that you should report to your doctor or health care professional as soon as possible: -allergic reactions like skin rash, itching or hives, swelling of the face, lips, or tongue -breathing problems -chest pain -fast, irregular heartbeat -feeling faint or lightheaded, falls -fever, chills, or any other sign of infection -muscle spasms, tightening, or twitches -numbness or tingling -skin blisters or bumps, or is dry, peels, or red -slow   healing or unexplained pain in the mouth or jaw -unusual bleeding or bruising Side effects that usually do not require medical attention (report to your doctor or health care professional  if they continue or are bothersome): -muscle pain -stomach upset, gas Where should I keep my medicine? This medicine is only given in a clinic, doctor's office, or other health care setting and will not be stored at home.  2017 [employer]vier/Gold Standard (2015-07-03 10:06:55)  

## 2016-11-19 LAB — PSA: Prostate Specific Ag, Serum: 0.1 ng/mL (ref 0.0–4.0)

## 2016-11-25 ENCOUNTER — Ambulatory Visit (HOSPITAL_BASED_OUTPATIENT_CLINIC_OR_DEPARTMENT_OTHER): Payer: Medicare Other | Admitting: Oncology

## 2016-11-25 ENCOUNTER — Ambulatory Visit (HOSPITAL_BASED_OUTPATIENT_CLINIC_OR_DEPARTMENT_OTHER): Payer: Medicare Other

## 2016-11-25 ENCOUNTER — Telehealth: Payer: Self-pay | Admitting: Oncology

## 2016-11-25 VITALS — BP 126/75 | HR 95 | Temp 98.0°F | Resp 20 | Ht 70.0 in | Wt 136.0 lb

## 2016-11-25 DIAGNOSIS — Z5111 Encounter for antineoplastic chemotherapy: Secondary | ICD-10-CM

## 2016-11-25 DIAGNOSIS — M898X9 Other specified disorders of bone, unspecified site: Secondary | ICD-10-CM

## 2016-11-25 DIAGNOSIS — C61 Malignant neoplasm of prostate: Secondary | ICD-10-CM

## 2016-11-25 DIAGNOSIS — E291 Testicular hypofunction: Secondary | ICD-10-CM | POA: Diagnosis not present

## 2016-11-25 DIAGNOSIS — C7951 Secondary malignant neoplasm of bone: Secondary | ICD-10-CM

## 2016-11-25 MED ORDER — LEUPROLIDE ACETATE (3 MONTH) 22.5 MG IM KIT
22.5000 mg | PACK | Freq: Once | INTRAMUSCULAR | Status: AC
  Administered 2016-11-25: 22.5 mg via INTRAMUSCULAR
  Filled 2016-11-25: qty 22.5

## 2016-11-25 NOTE — Patient Instructions (Signed)
Leuprolide depot injection What is this medicine? LEUPROLIDE (loo PROE lide) is a man-made protein that acts like a natural hormone in the body. It decreases testosterone in men and decreases estrogen in women. In men, this medicine is used to treat advanced prostate cancer. In women, some forms of this medicine may be used to treat endometriosis, uterine fibroids, or other male hormone-related problems. This medicine may be used for other purposes; ask your health care provider or pharmacist if you have questions. COMMON BRAND NAME(S): Eligard, Lupron Depot, Lupron Depot-Ped, Viadur What should I tell my health care provider before I take this medicine? They need to know if you have any of these conditions: -diabetes -heart disease or previous heart attack -high blood pressure -high cholesterol -mental illness -osteoporosis -pain or difficulty passing urine -seizures -spinal cord metastasis -stroke -suicidal thoughts, plans, or attempt; a previous suicide attempt by you or a family member -tobacco smoker -unusual vaginal bleeding (women) -an unusual or allergic reaction to leuprolide, benzyl alcohol, other medicines, foods, dyes, or preservatives -pregnant or trying to get pregnant -breast-feeding How should I use this medicine? This medicine is for injection into a muscle or for injection under the skin. It is given by a health care professional in a hospital or clinic setting. The specific product will determine how it will be given to you. Make sure you understand which product you receive and how often you will receive it. Talk to your pediatrician regarding the use of this medicine in children. Special care may be needed. Overdosage: If you think you have taken too much of this medicine contact a poison control center or emergency room at once. NOTE: This medicine is only for you. Do not share this medicine with others. What if I miss a dose? It is important not to miss a dose.  Call your doctor or health care professional if you are unable to keep an appointment. Depot injections: Depot injections are given either once-monthly, every 12 weeks, every 16 weeks, or every 24 weeks depending on the product you are prescribed. The product you are prescribed will be based on if you are male or male, and your condition. Make sure you understand your product and dosing. What may interact with this medicine? Do not take this medicine with any of the following medications: -chasteberry This medicine may also interact with the following medications: -herbal or dietary supplements, like black cohosh or DHEA -male hormones, like estrogens or progestins and birth control pills, patches, rings, or injections -male hormones, like testosterone This list may not describe all possible interactions. Give your health care provider a list of all the medicines, herbs, non-prescription drugs, or dietary supplements you use. Also tell them if you smoke, drink alcohol, or use illegal drugs. Some items may interact with your medicine. What should I watch for while using this medicine? Visit your doctor or health care professional for regular checks on your progress. During the first weeks of treatment, your symptoms may get worse, but then will improve as you continue your treatment. You may get hot flashes, increased bone pain, increased difficulty passing urine, or an aggravation of nerve symptoms. Discuss these effects with your doctor or health care professional, some of them may improve with continued use of this medicine. Male patients may experience a menstrual cycle or spotting during the first months of therapy with this medicine. If this continues, contact your doctor or health care professional. What side effects may I notice from receiving this medicine? Side   effects that you should report to your doctor or health care professional as soon as possible: -allergic reactions like skin  rash, itching or hives, swelling of the face, lips, or tongue -breathing problems -chest pain -depression or memory disorders -pain in your legs or groin -pain at site where injected or implanted -seizures -severe headache -swelling of the feet and legs -suicidal thoughts or other mood changes -visual changes -vomiting Side effects that usually do not require medical attention (report to your doctor or health care professional if they continue or are bothersome): -breast swelling or tenderness -decrease in sex drive or performance -diarrhea -hot flashes -loss of appetite -muscle, joint, or bone pains -nausea -redness or irritation at site where injected or implanted -skin problems or acne This list may not describe all possible side effects. Call your doctor for medical advice about side effects. You may report side effects to FDA at 1-800-FDA-1088. Where should I keep my medicine? This drug is given in a hospital or clinic and will not be stored at home. NOTE: This sheet is a summary. It may not cover all possible information. If you have questions about this medicine, talk to your doctor, pharmacist, or health care provider.  2018 Elsevier/Gold Standard (2015-11-13 09:45:53)  

## 2016-11-25 NOTE — Telephone Encounter (Signed)
Gave patient AVS and calender per 6/14 los -

## 2016-11-25 NOTE — Progress Notes (Signed)
Hematology and Oncology Follow Up Visit  Russell Browning [id number] Nov 13, 1940 76 y.o. 11/25/2016 12:11 PM    Principle Diagnosis:  76 year old gentleman with  advanced prostate cancer. He has metastatic disease to the bone. It appears to be hormone sensitive at this time. His cancer was inially diagnosed in 2000.    Prior Therapy: 1. He received radiation therapy and 2 years of Lupron, but it presumably was locally advanced prostate cancer at the time of diagnosis in 2000.  2. PSA was under control from 2001 up until the early parts of 2012, where his PSA rose up to 16.9. At that time, he was under the care of Dr. 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 He subsequently started on  Lupron every 3 months.  3. He is S/P radiation therapy directed to the right hip (30 gray in 12 fractions) concluded in 07/2011 for palliative purposes conducted in [place].    Current therapy:  1. Lupron 22.5 mg every three months.  2. Delton See given every 3 months started in 10/2011.   Interim History:  Mr. Jay presents today for a follow up visit. Since his last visit, he reports no changes in his health. He remains active and attending 2 activities of daily living. He denied any recent exacerbation of his pain or any pathological fractures. He continues to use fentanyl patch with very little breakthrough pain medication. He continues to tolerate Lupron and Xgeva without any side effects. He does report hot flashes but no other complications. He denied any urinary symptoms at this time.  He does not report any headaches, blurry vision, or seizures. He does not report any fevers or chills or sweats. He does not report any cough, wheezing or hemoptysis. Is not reporting any chest pain or difficulty breathing. Does not report any dyspnea on exertion or leg edema. Has not reported any nausea or vomiting. Has not reported any  palpitation or leg edema. He is not reporting any change in his bowel habits. Has not reported any urinary symptoms. Remainder of his review of systems unremarkable.   Medications: I have reviewed the patient's current medications.   Current Outpatient Prescriptions  Medication Sig Dispense Refill  . aspirin EC 81 MG EC tablet Take 1 tablet (81 mg total) by mouth daily. 30 tablet 11  . atenolol (TENORMIN) 50 MG tablet Take 12.5 mg by mouth daily. Take 1/4 tablet every day    . Calcium Carbonate-Vitamin D (CALCIUM-VITAMIN D) 500-200 MG-UNIT per tablet Take 1 tablet by mouth daily.    . clonazePAM (KLONOPIN) 1 MG tablet Take 1 mg by mouth at bedtime.     Marland Kitchen denosumab (XGEVA) 120 MG/1.7ML SOLN injection Inject 120 mg into the skin every 6 (six) weeks.    . fentaNYL (DURAGESIC - DOSED MCG/HR) 25 MCG/HR Place 1 patch onto the skin every 3 (three) days.    Marland Kitchen leuprolide (LUPRON) 22.5 MG injection Inject 22.5 mg into the muscle every 3 (three) months.    . levothyroxine (SYNTHROID, LEVOTHROID) 50 MCG tablet Take 1 tablet by mouth daily.    Marland Kitchen oxyCODONE-acetaminophen (PERCOCET) 10-325 MG per tablet Take 1 tablet by mouth daily.     . pantoprazole (PROTONIX) 40 MG tablet Take 40 mg by mouth daily.    . polyethylene glycol (MIRALAX / GLYCOLAX) packet Take 17 g by mouth daily.    . polyethylene glycol powder (GLYCOLAX/MIRALAX)  powder     . senna-docusate (EQ SENNA-S) 8.6-50 MG per tablet Take 2 tablets by mouth daily. 60 tablet 3  . simvastatin (ZOCOR) 40 MG tablet Take 40 mg by mouth at bedtime.      . ticagrelor (BRILINTA) 90 MG TABS tablet Take 1 tablet (90 mg total) by mouth 2 (two) times daily. 60 tablet 11  . venlafaxine XR (EFFEXOR-XR) 150 MG 24 hr capsule Take 150 mg by mouth daily.    . vitamin B-12 (CYANOCOBALAMIN) 1000 MCG tablet Take 1,000 mcg by mouth daily.      . nitroGLYCERIN (NITROSTAT) 0.4 MG SL tablet Place 1 tablet (0.4 mg total) under the tongue every 5 (five) minutes as needed for  chest pain. (Patient not taking: Reported on 11/25/2016) 25 tablet 3   No current facility-administered medications for this visit.      Allergies:  Allergies  Allergen Reactions  . Penicillins Other (See Comments)    Passed out  . Phenazopyridine Hcl Hives  . Ramipril Hives    Past Medical History, Surgical history, Social history, and Family History were reviewed and updated.   Physical Exam: Blood pressure 126/75, pulse 95, temperature 98 F (36.7 C), temperature source Oral, resp. rate 20, height 5\' 10"  (1.778 m), weight 136 lb (61.7 kg), SpO2 100 %. ECOG: 1 General appearance: Well-appearing gentleman appeared comfortable. Head: Normocephalic, without obvious abnormality no masses or lesions. Neck: no adenopathy  Lymph nodes: Cervical, supraclavicular, and axillary nodes normal. Heart:regular rate and rhythm, S1, S2 normal, no murmur, click, rub or gallop Lung:chest clear, no wheezing, rales, normal symmetric air entry Abdomin: soft, non-tender, without masses or organomegaly no shifting dullness or ascites. EXT:no erythema, induration, or nodules   Lab Results: Lab Results  Component Value Date   WBC 5.9 11/18/2016   HGB 11.9 (L) 11/18/2016   HCT 35.2 (L) 11/18/2016   MCV 90.0 11/18/2016   PLT 184 11/18/2016      Results for PHILIPP, CALLEGARI (MRN [id number]) as of 11/25/2016 12:18  Ref. Range 04/30/2016 07:56 08/06/2016 08:28 11/18/2016 08:33  PSA Latest Ref Range: 0.0 - 4.0 ng/mL 0.1 0.1 0.1          Impression and Plan: 76 year old gentleman with the following issues:  1. Hormone sensitive prostate cancer he is currently on Lupron with advanced disease to the bone.   His PSA continues to be very low without any clinical signs or symptoms of disease progression. The plan is to continue on androgen deprivation and add other therapies he develops castration resistance.  I see very little added value at this time of adding subsequent therapy such as Zytiga or  Taxotere chemotherapy given his excellent long-term disease control.   2. Bone pain:  He continues to be on fentanyl patch without exacerbation of his pain. This is prescribed by his primary care physician and had not required any additional pain medication.  3. Bone directed therapy: He is receiving Xgeva every 3 months for better tolerance. Complications associated with this therapy including hypocalcemia and osteonecrosis of the jaw were reviewed again and he is no objections to continue. His next injection will be in September 2018  4. Androgen depravation: He will continue Lupron 22.5 mg every 3 months and this will be given on 11/25/2016 and repeated on 03/02/2017.  5. Follow up: He will return on 02/23/2017 for laboratory check as well as Xgeva. On 03/02/2017 he will have an M.D. follow-up and Lupron.   Zola Button, MD 6/14/201812:11 PM

## 2016-11-30 ENCOUNTER — Telehealth: Payer: Self-pay | Admitting: Cardiology

## 2016-11-30 ENCOUNTER — Ambulatory Visit (INDEPENDENT_AMBULATORY_CARE_PROVIDER_SITE_OTHER): Payer: Medicare Other | Admitting: *Deleted

## 2016-11-30 DIAGNOSIS — I495 Sick sinus syndrome: Secondary | ICD-10-CM | POA: Diagnosis not present

## 2016-11-30 NOTE — Telephone Encounter (Signed)
Spoke with pt and reminded pt of remote transmission that is due today. Pt verbalized understanding.   

## 2016-12-02 LAB — CUP PACEART REMOTE DEVICE CHECK
Battery Impedance: 2095 Ohm
Battery Remaining Longevity: 28 mo
Brady Statistic AP VS Percent: 89 %
Brady Statistic AS VS Percent: 11 %
Date Time Interrogation Session: 20180619180801
Implantable Lead Implant Date: 19990408
Implantable Lead Location: 753859
Implantable Lead Location: 753860
Lead Channel Impedance Value: 1189 Ohm
Lead Channel Impedance Value: 141 Ohm
Lead Channel Pacing Threshold Pulse Width: 0.4 ms
Lead Channel Setting Pacing Amplitude: 2 V
MDC IDC LEAD IMPLANT DT: 19990408
MDC IDC LEAD SERIAL: 208202
MDC IDC MSMT BATTERY VOLTAGE: 2.72 V
MDC IDC MSMT LEADCHNL RV PACING THRESHOLD AMPLITUDE: 1.875 V
MDC IDC PG IMPLANT DT: 20081126
MDC IDC SET LEADCHNL RV PACING AMPLITUDE: 2.5 V
MDC IDC SET LEADCHNL RV PACING PULSEWIDTH: 0.76 ms
MDC IDC SET LEADCHNL RV SENSING SENSITIVITY: 5.6 mV
MDC IDC STAT BRADY AP VP PERCENT: 0 %
MDC IDC STAT BRADY AS VP PERCENT: 0 %

## 2016-12-02 NOTE — Progress Notes (Signed)
Remote pacemaker transmission.   

## 2016-12-03 ENCOUNTER — Encounter: Payer: Self-pay | Admitting: Cardiology

## 2016-12-08 DIAGNOSIS — M545 Low back pain: Secondary | ICD-10-CM | POA: Diagnosis not present

## 2016-12-08 DIAGNOSIS — E782 Mixed hyperlipidemia: Secondary | ICD-10-CM | POA: Diagnosis not present

## 2016-12-08 DIAGNOSIS — Z681 Body mass index (BMI) 19 or less, adult: Secondary | ICD-10-CM | POA: Diagnosis not present

## 2016-12-08 DIAGNOSIS — I2510 Atherosclerotic heart disease of native coronary artery without angina pectoris: Secondary | ICD-10-CM | POA: Diagnosis not present

## 2016-12-08 DIAGNOSIS — Z0001 Encounter for general adult medical examination with abnormal findings: Secondary | ICD-10-CM | POA: Diagnosis not present

## 2016-12-08 DIAGNOSIS — Z79891 Long term (current) use of opiate analgesic: Secondary | ICD-10-CM | POA: Diagnosis not present

## 2016-12-08 DIAGNOSIS — C61 Malignant neoplasm of prostate: Secondary | ICD-10-CM | POA: Diagnosis not present

## 2016-12-08 DIAGNOSIS — I10 Essential (primary) hypertension: Secondary | ICD-10-CM | POA: Diagnosis not present

## 2017-01-05 DIAGNOSIS — Z955 Presence of coronary angioplasty implant and graft: Secondary | ICD-10-CM | POA: Diagnosis not present

## 2017-01-05 DIAGNOSIS — Z9049 Acquired absence of other specified parts of digestive tract: Secondary | ICD-10-CM | POA: Diagnosis not present

## 2017-01-05 DIAGNOSIS — Z880 Allergy status to penicillin: Secondary | ICD-10-CM | POA: Diagnosis not present

## 2017-01-05 DIAGNOSIS — Z87891 Personal history of nicotine dependence: Secondary | ICD-10-CM | POA: Diagnosis not present

## 2017-01-05 DIAGNOSIS — Z79891 Long term (current) use of opiate analgesic: Secondary | ICD-10-CM | POA: Diagnosis not present

## 2017-01-05 DIAGNOSIS — C61 Malignant neoplasm of prostate: Secondary | ICD-10-CM | POA: Diagnosis not present

## 2017-01-05 DIAGNOSIS — Z79818 Long term (current) use of other agents affecting estrogen receptors and estrogen levels: Secondary | ICD-10-CM | POA: Diagnosis not present

## 2017-01-05 DIAGNOSIS — R52 Pain, unspecified: Secondary | ICD-10-CM | POA: Diagnosis not present

## 2017-01-05 DIAGNOSIS — I252 Old myocardial infarction: Secondary | ICD-10-CM | POA: Diagnosis not present

## 2017-01-05 DIAGNOSIS — F329 Major depressive disorder, single episode, unspecified: Secondary | ICD-10-CM | POA: Diagnosis not present

## 2017-01-05 DIAGNOSIS — C7951 Secondary malignant neoplasm of bone: Secondary | ICD-10-CM | POA: Diagnosis not present

## 2017-01-05 DIAGNOSIS — Z950 Presence of cardiac pacemaker: Secondary | ICD-10-CM | POA: Diagnosis not present

## 2017-01-05 DIAGNOSIS — Z888 Allergy status to other drugs, medicaments and biological substances status: Secondary | ICD-10-CM | POA: Diagnosis not present

## 2017-01-05 DIAGNOSIS — Z79899 Other long term (current) drug therapy: Secondary | ICD-10-CM | POA: Diagnosis not present

## 2017-01-05 DIAGNOSIS — Z7982 Long term (current) use of aspirin: Secondary | ICD-10-CM | POA: Diagnosis not present

## 2017-01-11 DIAGNOSIS — A770 Spotted fever due to Rickettsia rickettsii: Secondary | ICD-10-CM | POA: Diagnosis not present

## 2017-01-11 DIAGNOSIS — Z681 Body mass index (BMI) 19 or less, adult: Secondary | ICD-10-CM | POA: Diagnosis not present

## 2017-01-21 ENCOUNTER — Ambulatory Visit (INDEPENDENT_AMBULATORY_CARE_PROVIDER_SITE_OTHER): Payer: Medicare Other | Admitting: Internal Medicine

## 2017-01-21 ENCOUNTER — Encounter: Payer: Self-pay | Admitting: Internal Medicine

## 2017-01-21 VITALS — BP 113/68 | HR 82 | Ht 70.0 in | Wt 134.0 lb

## 2017-01-21 DIAGNOSIS — I495 Sick sinus syndrome: Secondary | ICD-10-CM | POA: Diagnosis not present

## 2017-01-21 DIAGNOSIS — I259 Chronic ischemic heart disease, unspecified: Secondary | ICD-10-CM

## 2017-01-21 DIAGNOSIS — I2510 Atherosclerotic heart disease of native coronary artery without angina pectoris: Secondary | ICD-10-CM

## 2017-01-21 LAB — CUP PACEART INCLINIC DEVICE CHECK
Battery Impedance: 2130 Ohm
Battery Remaining Longevity: 27 mo
Battery Voltage: 2.74 V
Brady Statistic AP VP Percent: 0 %
Brady Statistic AP VS Percent: 89 %
Brady Statistic AS VP Percent: 0 %
Date Time Interrogation Session: 20180810112746
Implantable Lead Implant Date: 19990408
Implantable Lead Location: 753859
Implantable Lead Model: 4035
Lead Channel Impedance Value: 1081 Ohm
Lead Channel Pacing Threshold Amplitude: 1 V
Lead Channel Sensing Intrinsic Amplitude: 0.5 mV
Lead Channel Sensing Intrinsic Amplitude: 11.2 mV
MDC IDC LEAD IMPLANT DT: 19990408
MDC IDC LEAD LOCATION: 753860
MDC IDC LEAD SERIAL: 208202
MDC IDC MSMT LEADCHNL RA IMPEDANCE VALUE: 137 Ohm
MDC IDC MSMT LEADCHNL RA PACING THRESHOLD PULSEWIDTH: 0.4 ms
MDC IDC MSMT LEADCHNL RV PACING THRESHOLD AMPLITUDE: 1.5 V
MDC IDC MSMT LEADCHNL RV PACING THRESHOLD AMPLITUDE: 1.875 V
MDC IDC MSMT LEADCHNL RV PACING THRESHOLD PULSEWIDTH: 0.4 ms
MDC IDC MSMT LEADCHNL RV PACING THRESHOLD PULSEWIDTH: 0.76 ms
MDC IDC PG IMPLANT DT: 20081126
MDC IDC SET LEADCHNL RA PACING AMPLITUDE: 2 V
MDC IDC SET LEADCHNL RV PACING AMPLITUDE: 2.5 V
MDC IDC SET LEADCHNL RV PACING PULSEWIDTH: 0.76 ms
MDC IDC SET LEADCHNL RV SENSING SENSITIVITY: 4 mV
MDC IDC STAT BRADY AS VS PERCENT: 11 %

## 2017-01-21 NOTE — Patient Instructions (Signed)
Medication Instructions:  Continue all current medications.  Labwork: none  Testing/Procedures: none  Follow-Up: Your physician wants you to follow up in:  1 year.  You will receive a reminder letter in the mail one-two months in advance.  If you don't receive a letter, please call our office to schedule the follow up appointment   Any Other Special Instructions Will Be Listed Below (If Applicable). Remote monitoring is used to monitor your Pacemaker of ICD from home. This monitoring reduces the number of office visits required to check your device to one time per year. It allows [place] to keep an eye on the functioning of your device to ensure it is working properly. You are scheduled for a device check from home on 03-01-2017. You may send your transmission at any time that day. If you have a wireless device, the transmission will be sent automatically. After your physician reviews your transmission, you will receive a postcard with your next transmission date.  If you need a refill on your cardiac medications before your next appointment, please call your pharmacy.

## 2017-01-21 NOTE — Progress Notes (Signed)
PCP: Caryl Bis, MD Primary Cardiologist:  Dr Domenic Polite Primary EP:  Dr Rayann Heman  Colon Flattery is a 76 y.o. male who presents today for routine electrophysiology followup.  Since last being seen in our clinic, the patient reports doing very well.  Today, he denies symptoms of palpitations, chest pain, shortness of breath,  lower extremity edema, dizziness, presyncope, or syncope.  He has had some epistaxis with brilintaThe patient is otherwise without complaint today.   Past Medical History:  Diagnosis Date  . Anemia   . CAD (coronary artery disease), native coronary artery    a. DES to proximal, mid, and distal RCA 2006. b. 12/2014 - [place] s/p  PTCA to the mid-distal previously placed RCA stents, otherwise nonobstructive CAD for continued medical therapy.  Marland Kitchen History of TIAs   . Mixed hyperlipidemia   . 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 Prostate cancer metastatic to bone ([place])   . Sinus node dysfunction [facility])    [employer] pacemaker - Dr. Rayann Heman   Past Surgical History:  Procedure Laterality Date  . CARDIAC CATHETERIZATION N/A 01/06/2015   Procedure: Left Heart Cath and Coronary Angiography;  Surgeon: Troy Sine, MD;  Location: [facility];  Service: Cardiovascular;  Laterality: N/A;  . PACEMAKER INSERTION     MDT    ROS- all systems are reviewed and negative except as per HPI above  Current Outpatient Prescriptions  Medication Sig Dispense Refill  . aspirin EC 81 MG EC tablet Take 1 tablet (81 mg total) by mouth daily. 30 tablet 11  . atenolol (TENORMIN) 50 MG tablet Take 12.5 mg by mouth daily. Take 1/4 tablet every day    . Calcium Carbonate-Vitamin D (CALCIUM-VITAMIN D) 500-200 MG-UNIT per tablet Take 1 tablet by mouth daily.    . clonazePAM (KLONOPIN) 1 MG tablet Take 1 mg by mouth at bedtime.     Marland Kitchen denosumab (XGEVA) 120 MG/1.7ML SOLN  injection Inject 120 mg into the skin every 6 (six) weeks.    . fentaNYL (DURAGESIC - DOSED MCG/HR) 25 MCG/HR Place 1 patch onto the skin every 3 (three) days.    Marland Kitchen leuprolide (LUPRON) 22.5 MG injection Inject 22.5 mg into the muscle every 3 (three) months.    . levothyroxine (SYNTHROID, LEVOTHROID) 50 MCG tablet Take 1 tablet by mouth daily.    . nitroGLYCERIN (NITROSTAT) 0.4 MG SL tablet Place 1 tablet (0.4 mg total) under the tongue every 5 (five) minutes as needed for chest pain. 25 tablet 3  . oxyCODONE-acetaminophen (PERCOCET) 10-325 MG per tablet Take 1 tablet by mouth daily.     . pantoprazole (PROTONIX) 40 MG tablet Take 40 mg by mouth daily.    . polyethylene glycol (MIRALAX / GLYCOLAX) packet Take 17 g by mouth daily.    . polyethylene glycol powder (GLYCOLAX/MIRALAX) powder     . senna-docusate (EQ SENNA-S) 8.6-50 MG per tablet Take 2 tablets by mouth daily. 60 tablet 3  . simvastatin (ZOCOR) 40 MG tablet Take 40 mg by mouth at bedtime.      . ticagrelor (BRILINTA) 90 MG TABS tablet Take 1 tablet (90 mg total) by mouth 2 (two) times daily. 60 tablet 11  . venlafaxine XR (EFFEXOR-XR) 150 MG 24 hr capsule Take 150 mg by mouth daily.    . vitamin B-12 (CYANOCOBALAMIN) 1000 MCG tablet Take 1,000 mcg  by mouth daily.       No current facility-administered medications for this visit.     Physical Exam: Vitals:   01/21/17 1017  BP: 113/68  Pulse: 82  SpO2: 99%  Weight: 134 lb (60.8 kg)  Height: 5\' 10"  (1.778 m)    GEN- The patient is well appearing, alert and oriented x 3 today.   Head- normocephalic, atraumatic Eyes-  Sclera clear, conjunctiva pink Ears- hearing intact Oropharynx- clear Lungs- Clear to ausculation bilaterally, normal work of breathing Chest- pacemaker pocket is well healed Heart- Regular rate and rhythm, no murmurs, rubs or gallops, PMI not laterally displaced GI- soft, NT, ND, + BS Extremities- no clubbing, cyanosis, or edema  Pacemaker interrogation-  reviewed in detail today,  See PACEART report    Assessment and Plan:  1. Symptomatic sinus bradycardia   Atrial lead impedance is chronically low.  Pt reports "Dr Caryl Comes had to do my last generator change with bolt cutters and then lead got frayed.  He put silicone on it".   Battery status remains good (7 mo -4 years). See [facility] report No changes today Would plan new atrial lead at time of next generator change  2. CAD No ischemic symptoms No changes today He will discuss epistaxis with Dr Domenic Polite I have advised that he discuss with ENT   Return to the device clinic in 1year [facility] every 3 months Follow-up with Dr Domenic Polite as scheduled  Thompson Grayer MD, [facility] 01/21/2017 11:00 AM

## 2017-02-18 ENCOUNTER — Encounter: Payer: Medicare Other | Admitting: Internal Medicine

## 2017-02-23 ENCOUNTER — Ambulatory Visit: Payer: Medicare Other

## 2017-02-23 ENCOUNTER — Other Ambulatory Visit (HOSPITAL_BASED_OUTPATIENT_CLINIC_OR_DEPARTMENT_OTHER): Payer: Medicare Other

## 2017-02-23 ENCOUNTER — Encounter (HOSPITAL_BASED_OUTPATIENT_CLINIC_OR_DEPARTMENT_OTHER): Payer: Medicare Other

## 2017-02-23 DIAGNOSIS — Z950 Presence of cardiac pacemaker: Secondary | ICD-10-CM

## 2017-02-23 DIAGNOSIS — C61 Malignant neoplasm of prostate: Secondary | ICD-10-CM

## 2017-02-23 DIAGNOSIS — C7951 Secondary malignant neoplasm of bone: Secondary | ICD-10-CM

## 2017-02-23 DIAGNOSIS — G459 Transient cerebral ischemic attack, unspecified: Secondary | ICD-10-CM

## 2017-02-23 DIAGNOSIS — I259 Chronic ischemic heart disease, unspecified: Secondary | ICD-10-CM

## 2017-02-23 DIAGNOSIS — T82110D Breakdown (mechanical) of cardiac electrode, subsequent encounter: Secondary | ICD-10-CM

## 2017-02-23 DIAGNOSIS — I495 Sick sinus syndrome: Secondary | ICD-10-CM

## 2017-02-23 DIAGNOSIS — I951 Orthostatic hypotension: Secondary | ICD-10-CM

## 2017-02-23 LAB — COMPREHENSIVE METABOLIC PANEL
ALBUMIN: 3.9 g/dL (ref 3.5–5.0)
ALK PHOS: 19 U/L — AB (ref 40–150)
ALT: 10 U/L (ref 0–55)
ANION GAP: 9 meq/L (ref 3–11)
AST: 18 U/L (ref 5–34)
BILIRUBIN TOTAL: 0.35 mg/dL (ref 0.20–1.20)
BUN: 14 mg/dL (ref 7.0–26.0)
CALCIUM: 9.5 mg/dL (ref 8.4–10.4)
CHLORIDE: 105 meq/L (ref 98–109)
CO2: 27 mEq/L (ref 22–29)
CREATININE: 1.1 mg/dL (ref 0.7–1.3)
EGFR: 67 mL/min/{1.73_m2} — ABNORMAL LOW (ref >90)
Glucose: 102 mg/dl (ref 70–140)
Potassium: 3.5 mEq/L (ref 3.5–5.1)
Sodium: 141 mEq/L (ref 136–145)
Total Protein: 6.6 g/dL (ref 6.4–8.3)

## 2017-02-23 LAB — CBC WITH DIFFERENTIAL/PLATELET
BASO%: 0.5 % (ref 0.0–2.0)
Basophils Absolute: 0 10*3/uL (ref 0.0–0.1)
EOS%: 3.6 % (ref 0.0–7.0)
Eosinophils Absolute: 0.2 10*3/uL (ref 0.0–0.5)
HEMATOCRIT: 33.4 % — AB (ref 38.4–49.9)
HEMOGLOBIN: 10.7 g/dL — AB (ref 13.0–17.1)
LYMPH%: 32.5 % (ref 14.0–49.0)
MCH: 28.9 pg (ref 27.2–33.4)
MCHC: 32 g/dL (ref 32.0–36.0)
MCV: 90.3 fL (ref 79.3–98.0)
MONO#: 0.5 10*3/uL (ref 0.1–0.9)
MONO%: 8.6 % (ref 0.0–14.0)
NEUT#: 3.1 10*3/uL (ref 1.5–6.5)
NEUT%: 54.8 % (ref 39.0–75.0)
PLATELETS: 154 10*3/uL (ref 140–400)
RBC: 3.7 10*6/uL — ABNORMAL LOW (ref 4.20–5.82)
RDW: 14 % (ref 11.0–14.6)
WBC: 5.6 10*3/uL (ref 4.0–10.3)
lymph#: 1.8 10*3/uL (ref 0.9–3.3)

## 2017-02-23 MED ORDER — DENOSUMAB 120 MG/1.7ML ~~LOC~~ SOLN
120.0000 mg | Freq: Once | SUBCUTANEOUS | Status: AC
  Administered 2017-02-23: 120 mg via SUBCUTANEOUS
  Filled 2017-02-23: qty 1.7

## 2017-02-23 NOTE — Patient Instructions (Signed)
Denosumab injection What is this medicine? DENOSUMAB (den oh sue mab) slows bone breakdown. Prolia is used to treat osteoporosis in women after menopause and in men. Xgeva is used to prevent bone fractures and other bone problems caused by cancer bone metastases. Xgeva is also used to treat giant cell tumor of the bone. COMMON BRAND NAME(S): Prolia, XGEVA What should I tell my health care provider before I take this medicine? They need to know if you have any of these conditions: -dental disease -eczema -infection or history of infections -kidney disease or on dialysis -low blood calcium or vitamin D -malabsorption syndrome -scheduled to have surgery or tooth extraction -taking medicine that contains denosumab -thyroid or parathyroid disease -an unusual reaction to denosumab, other medicines, foods, dyes, or preservatives -pregnant or trying to get pregnant -breast-feeding How should I use this medicine? This medicine is for injection under the skin. It is given by a health care professional in a hospital or clinic setting. If you are getting Prolia, a special MedGuide will be given to you by the pharmacist with each prescription and refill. Be sure to read this information carefully each time. For Prolia, talk to your pediatrician regarding the use of this medicine in children. Special care may be needed. For Xgeva, talk to your pediatrician regarding the use of this medicine in children. While this drug may be prescribed for children as young as 13 years for selected conditions, precautions do apply. What if I miss a dose? It is important not to miss your dose. Call your doctor or health care professional if you are unable to keep an appointment. What may interact with this medicine? Do not take this medicine with any of the following medications: -other medicines containing denosumab This medicine may also interact with the following medications: -medicines that suppress the immune  system -medicines that treat cancer -steroid medicines like prednisone or cortisone What should I watch for while using this medicine? Visit your doctor or health care professional for regular checks on your progress. Your doctor or health care professional may order blood tests and other tests to see how you are doing. Call your doctor or health care professional if you get a cold or other infection while receiving this medicine. Do not treat yourself. This medicine may decrease your body's ability to fight infection. You should make sure you get enough calcium and vitamin D while you are taking this medicine, unless your doctor tells you not to. Discuss the foods you eat and the vitamins you take with your health care professional. See your dentist regularly. Brush and floss your teeth as directed. Before you have any dental work done, tell your dentist you are receiving this medicine. Do not become pregnant while taking this medicine or for 5 months after stopping it. Women should inform their doctor if they wish to become pregnant or think they might be pregnant. There is a potential for serious side effects to an unborn child. Talk to your health care professional or pharmacist for more information. What side effects may I notice from receiving this medicine? Side effects that you should report to your doctor or health care professional as soon as possible: -allergic reactions like skin rash, itching or hives, swelling of the face, lips, or tongue -breathing problems -chest pain -fast, irregular heartbeat -feeling faint or lightheaded, falls -fever, chills, or any other sign of infection -muscle spasms, tightening, or twitches -numbness or tingling -skin blisters or bumps, or is dry, peels, or red -slow   healing or unexplained pain in the mouth or jaw -unusual bleeding or bruising Side effects that usually do not require medical attention (report to your doctor or health care professional  if they continue or are bothersome): -muscle pain -stomach upset, gas Where should I keep my medicine? This medicine is only given in a clinic, doctor's office, or other health care setting and will not be stored at home.  2017 [employer]vier/Gold Standard (2015-07-03 10:06:55)  

## 2017-02-24 DIAGNOSIS — E782 Mixed hyperlipidemia: Secondary | ICD-10-CM | POA: Diagnosis not present

## 2017-02-24 DIAGNOSIS — Z9189 Other specified personal risk factors, not elsewhere classified: Secondary | ICD-10-CM | POA: Diagnosis not present

## 2017-02-24 DIAGNOSIS — I10 Essential (primary) hypertension: Secondary | ICD-10-CM | POA: Diagnosis not present

## 2017-02-24 DIAGNOSIS — K210 Gastro-esophageal reflux disease with esophagitis: Secondary | ICD-10-CM | POA: Diagnosis not present

## 2017-02-24 DIAGNOSIS — D630 Anemia in neoplastic disease: Secondary | ICD-10-CM | POA: Diagnosis not present

## 2017-02-24 LAB — PSA: PROSTATE SPECIFIC AG, SERUM: 0.1 ng/mL (ref 0.0–4.0)

## 2017-03-01 ENCOUNTER — Telehealth: Payer: Self-pay

## 2017-03-01 ENCOUNTER — Ambulatory Visit (INDEPENDENT_AMBULATORY_CARE_PROVIDER_SITE_OTHER): Payer: Medicare Other | Admitting: *Deleted

## 2017-03-01 ENCOUNTER — Telehealth: Payer: Self-pay | Admitting: Cardiology

## 2017-03-01 DIAGNOSIS — I495 Sick sinus syndrome: Secondary | ICD-10-CM

## 2017-03-01 NOTE — Telephone Encounter (Signed)
Attempted to help pt with transmission~ phone got disconnected attempted to call pt back.

## 2017-03-01 NOTE — Progress Notes (Signed)
Remote pacemaker transmission.   

## 2017-03-01 NOTE — Telephone Encounter (Signed)
Spoke with pt and reminded pt of remote transmission that is due today. Pt verbalized understanding.   

## 2017-03-02 ENCOUNTER — Ambulatory Visit (HOSPITAL_BASED_OUTPATIENT_CLINIC_OR_DEPARTMENT_OTHER): Payer: Medicare Other | Admitting: Oncology

## 2017-03-02 ENCOUNTER — Ambulatory Visit (HOSPITAL_BASED_OUTPATIENT_CLINIC_OR_DEPARTMENT_OTHER): Payer: Medicare Other

## 2017-03-02 VITALS — BP 121/70 | HR 95 | Temp 97.9°F | Resp 17 | Ht 70.0 in | Wt 135.9 lb

## 2017-03-02 DIAGNOSIS — M898X9 Other specified disorders of bone, unspecified site: Secondary | ICD-10-CM | POA: Diagnosis not present

## 2017-03-02 DIAGNOSIS — C7951 Secondary malignant neoplasm of bone: Secondary | ICD-10-CM

## 2017-03-02 DIAGNOSIS — Z5111 Encounter for antineoplastic chemotherapy: Secondary | ICD-10-CM

## 2017-03-02 DIAGNOSIS — C61 Malignant neoplasm of prostate: Secondary | ICD-10-CM

## 2017-03-02 DIAGNOSIS — R61 Generalized hyperhidrosis: Secondary | ICD-10-CM | POA: Diagnosis not present

## 2017-03-02 DIAGNOSIS — E291 Testicular hypofunction: Secondary | ICD-10-CM | POA: Diagnosis not present

## 2017-03-02 LAB — CUP PACEART REMOTE DEVICE CHECK
Battery Impedance: 2167 Ohm
Battery Remaining Longevity: 26 mo
Brady Statistic AP VS Percent: 91 %
Brady Statistic AS VS Percent: 9 %
Implantable Lead Implant Date: 19990408
Implantable Lead Location: 753859
Implantable Lead Location: 753860
Implantable Lead Model: 4035
Lead Channel Impedance Value: 1178 Ohm
Lead Channel Impedance Value: 139 Ohm
Lead Channel Pacing Threshold Amplitude: 1.75 V
Lead Channel Pacing Threshold Pulse Width: 0.4 ms
MDC IDC LEAD IMPLANT DT: 19990408
MDC IDC LEAD SERIAL: 208202
MDC IDC MSMT BATTERY VOLTAGE: 2.74 V
MDC IDC PG IMPLANT DT: 20081126
MDC IDC SESS DTM: 20180918164104
MDC IDC SET LEADCHNL RA PACING AMPLITUDE: 2 V
MDC IDC SET LEADCHNL RV PACING AMPLITUDE: 2.5 V
MDC IDC SET LEADCHNL RV PACING PULSEWIDTH: 0.76 ms
MDC IDC SET LEADCHNL RV SENSING SENSITIVITY: 5.6 mV
MDC IDC STAT BRADY AP VP PERCENT: 0 %
MDC IDC STAT BRADY AS VP PERCENT: 0 %

## 2017-03-02 MED ORDER — LEUPROLIDE ACETATE (3 MONTH) 22.5 MG IM KIT
22.5000 mg | PACK | Freq: Once | INTRAMUSCULAR | Status: AC
  Administered 2017-03-02: 22.5 mg via INTRAMUSCULAR
  Filled 2017-03-02: qty 22.5

## 2017-03-02 NOTE — Progress Notes (Signed)
Hematology and Oncology Follow Up Visit  Russell Browning [id number] Jun 14, 1941 76 y.o. 03/02/2017 9:55 AM    Principle Diagnosis:  76 year old gentleman with  advanced prostate cancer. He has metastatic disease to the bone. It appears to be hormone sensitive at this time. His cancer was inially diagnosed in 2000.    Prior Therapy: 1. He received radiation therapy and 2 years of Lupron, but it presumably was locally advanced prostate cancer at the time of diagnosis in 2000.  2. PSA was under control from 2001 up until the early parts of 2012, where his PSA rose up to 16.9. At that time, he was under the care of Dr. 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 He subsequently started on  Lupron every 3 months.  3. He is S/P radiation therapy directed to the right hip (30 gray in 12 fractions) concluded in 07/2011 for palliative purposes conducted in [place].    Current therapy:  1. Lupron 22.5 mg every three months.  2. Delton See given every 3 months started in 10/2011.   Interim History:  Mr. Russell Browning presents today for a follow up visit. Since his last visit, he reports doing well without any recent complaints. He does report periodic constipation that is manageable with stool softeners and laxatives. He continues to use fentanyl patch with very little breakthrough pain medication. He continues to tolerate Lupron and Xgeva without any side effects. He does report hot flashes but no other complications. He denied any urinary symptoms at this time. He denied any decline in his energy her performance status. He denied any recent hospitalizations.  He does not report any headaches, blurry vision, or seizures. He does not report any fevers or chills or sweats. He does not report any cough, wheezing or hemoptysis. Is not reporting any chest pain or difficulty breathing. Does not report any dyspnea on exertion or leg edema. Has  not reported any nausea or vomiting. Has not reported any palpitation or leg edema. He is not reporting any change in his bowel habits. Has not reported any urinary symptoms. Remainder of his review of systems unremarkable.   Medications: I have reviewed the patient's current medications.   Current Outpatient Prescriptions  Medication Sig Dispense Refill  . aspirin EC 81 MG EC tablet Take 1 tablet (81 mg total) by mouth daily. 30 tablet 11  . atenolol (TENORMIN) 50 MG tablet Take 12.5 mg by mouth daily. Take 1/4 tablet every day    . Calcium Carbonate-Vitamin D (CALCIUM-VITAMIN D) 500-200 MG-UNIT per tablet Take 1 tablet by mouth daily.    . clonazePAM (KLONOPIN) 1 MG tablet Take 1 mg by mouth at bedtime.     Marland Kitchen denosumab (XGEVA) 120 MG/1.7ML SOLN injection Inject 120 mg into the skin every 6 (six) weeks.    . fentaNYL (DURAGESIC - DOSED MCG/HR) 25 MCG/HR Place 1 patch onto the skin every 3 (three) days.    Marland Kitchen leuprolide (LUPRON) 22.5 MG injection Inject 22.5 mg into the muscle every 3 (three) months.    . levothyroxine (SYNTHROID, LEVOTHROID) 50 MCG tablet Take 1 tablet by mouth daily.    . nitroGLYCERIN (NITROSTAT) 0.4 MG SL tablet Place 1 tablet (0.4 mg total) under the tongue every 5 (five) minutes as needed for chest pain. 25 tablet 3  . oxyCODONE-acetaminophen (PERCOCET) 10-325 MG per tablet Take 1 tablet by mouth daily.     Marland Kitchen  pantoprazole (PROTONIX) 40 MG tablet Take 40 mg by mouth daily.    . polyethylene glycol (MIRALAX / GLYCOLAX) packet Take 17 g by mouth daily.    . polyethylene glycol powder (GLYCOLAX/MIRALAX) powder     . senna-docusate (EQ SENNA-S) 8.6-50 MG per tablet Take 2 tablets by mouth daily. 60 tablet 3  . simvastatin (ZOCOR) 40 MG tablet Take 40 mg by mouth at bedtime.      . ticagrelor (BRILINTA) 90 MG TABS tablet Take 1 tablet (90 mg total) by mouth 2 (two) times daily. 60 tablet 11  . venlafaxine XR (EFFEXOR-XR) 150 MG 24 hr capsule Take 150 mg by mouth daily.    .  vitamin B-12 (CYANOCOBALAMIN) 1000 MCG tablet Take 1,000 mcg by mouth daily.       No current facility-administered medications for this visit.      Allergies:  Allergies  Allergen Reactions  . Penicillins Other (See Comments)    Passed out  . Phenazopyridine Hcl Hives  . Ramipril Hives    Past Medical History, Surgical history, Social history, and Family History were reviewed and updated.   Physical Exam: Blood pressure 121/70, pulse 95, temperature 97.9 F (36.6 C), temperature source Oral, resp. rate 17, height 5\' 10"  (1.778 m), weight 135 lb 14.4 oz (61.6 kg), SpO2 100 %. ECOG: 1 General appearance: Alert, awake gentleman without distress. Head: Normocephalic, without obvious abnormality no oral thrush. Neck: no adenopathy no neck masses. Lymph nodes: Cervical, supraclavicular, and axillary nodes normal. Heart:regular rate and rhythm, S1, S2 normal, no murmur, click, rub or gallop Lung:chest clear, no wheezing, rales, normal symmetric air entry Abdomin: soft, non-tender, without masses or organomegaly no Rebound or guarding. EXT:no erythema, induration, or nodules   Lab Results: Lab Results  Component Value Date   WBC 5.6 02/23/2017   HGB 10.7 (L) 02/23/2017   HCT 33.4 (L) 02/23/2017   MCV 90.3 02/23/2017   PLT 154 02/23/2017      Results for HEITH, HAIGLER (MRN [id number]) as of 03/02/2017 09:48  Ref. Range 08/06/2016 08:28 11/18/2016 08:33 02/23/2017 08:06  Prostate Specific Ag, Serum Latest Ref Range: 0.0 - 4.0 ng/mL 0.1 0.1 0.1         Impression and Plan: 76 year old gentleman with the following issues:  1. Hormone sensitive prostate cancer he is currently on Lupron with advanced disease to the bone.   His PSA continues  to be under control without any clinical signs or symptoms of disease progression.   The plan is to continue on androgen deprivation and add other therapies he develops castration resistance. Additional therapy may be needed if his PSA  starts to rise.   2. Bone pain:  He continues to be on fentanyl patch without exacerbation of his pain. This is prescribed by his primary care physician and had not required any additional pain medication.  3. Bone directed therapy: He is receiving Xgeva every 3 months for better tolerance. He is agreeable to continue with this at this time.  4. Androgen depravation: He will continue Lupron 22.5 mg every 3 months and this will be given on 03/02/2017. This will be repeated on 06/01/2017.  5. Follow up: He will return on 05/25/2017 for laboratory check as well as Xgeva. On 06/01/2017 he will have an M.D. follow-up and Lupron.   Zola Button, MD 9/19/20189:55 AM

## 2017-03-02 NOTE — Patient Instructions (Signed)
Leuprolide depot injection What is this medicine? LEUPROLIDE (loo PROE lide) is a man-made protein that acts like a natural hormone in the body. It decreases testosterone in men and decreases estrogen in women. In men, this medicine is used to treat advanced prostate cancer. In women, some forms of this medicine may be used to treat endometriosis, uterine fibroids, or other male hormone-related problems. This medicine may be used for other purposes; ask your health care provider or pharmacist if you have questions. COMMON BRAND NAME(S): Eligard, Lupron Depot, Lupron Depot-Ped, Viadur What should I tell my health care provider before I take this medicine? They need to know if you have any of these conditions: -diabetes -heart disease or previous heart attack -high blood pressure -high cholesterol -mental illness -osteoporosis -pain or difficulty passing urine -seizures -spinal cord metastasis -stroke -suicidal thoughts, plans, or attempt; a previous suicide attempt by you or a family member -tobacco smoker -unusual vaginal bleeding (women) -an unusual or allergic reaction to leuprolide, benzyl alcohol, other medicines, foods, dyes, or preservatives -pregnant or trying to get pregnant -breast-feeding How should I use this medicine? This medicine is for injection into a muscle or for injection under the skin. It is given by a health care professional in a hospital or clinic setting. The specific product will determine how it will be given to you. Make sure you understand which product you receive and how often you will receive it. Talk to your pediatrician regarding the use of this medicine in children. Special care may be needed. Overdosage: If you think you have taken too much of this medicine contact a poison control center or emergency room at once. NOTE: This medicine is only for you. Do not share this medicine with others. What if I miss a dose? It is important not to miss a dose.  Call your doctor or health care professional if you are unable to keep an appointment. Depot injections: Depot injections are given either once-monthly, every 12 weeks, every 16 weeks, or every 24 weeks depending on the product you are prescribed. The product you are prescribed will be based on if you are male or male, and your condition. Make sure you understand your product and dosing. What may interact with this medicine? Do not take this medicine with any of the following medications: -chasteberry This medicine may also interact with the following medications: -herbal or dietary supplements, like black cohosh or DHEA -male hormones, like estrogens or progestins and birth control pills, patches, rings, or injections -male hormones, like testosterone This list may not describe all possible interactions. Give your health care provider a list of all the medicines, herbs, non-prescription drugs, or dietary supplements you use. Also tell them if you smoke, drink alcohol, or use illegal drugs. Some items may interact with your medicine. What should I watch for while using this medicine? Visit your doctor or health care professional for regular checks on your progress. During the first weeks of treatment, your symptoms may get worse, but then will improve as you continue your treatment. You may get hot flashes, increased bone pain, increased difficulty passing urine, or an aggravation of nerve symptoms. Discuss these effects with your doctor or health care professional, some of them may improve with continued use of this medicine. Male patients may experience a menstrual cycle or spotting during the first months of therapy with this medicine. If this continues, contact your doctor or health care professional. What side effects may I notice from receiving this medicine? Side   effects that you should report to your doctor or health care professional as soon as possible: -allergic reactions like skin  rash, itching or hives, swelling of the face, lips, or tongue -breathing problems -chest pain -depression or memory disorders -pain in your legs or groin -pain at site where injected or implanted -seizures -severe headache -swelling of the feet and legs -suicidal thoughts or other mood changes -visual changes -vomiting Side effects that usually do not require medical attention (report to your doctor or health care professional if they continue or are bothersome): -breast swelling or tenderness -decrease in sex drive or performance -diarrhea -hot flashes -loss of appetite -muscle, joint, or bone pains -nausea -redness or irritation at site where injected or implanted -skin problems or acne This list may not describe all possible side effects. Call your doctor for medical advice about side effects. You may report side effects to FDA at 1-800-FDA-1088. Where should I keep my medicine? This drug is given in a hospital or clinic and will not be stored at home. NOTE: This sheet is a summary. It may not cover all possible information. If you have questions about this medicine, talk to your doctor, pharmacist, or health care provider.  2018 Elsevier/Gold Standard (2015-11-13 09:45:53)  

## 2017-03-03 ENCOUNTER — Encounter: Payer: Self-pay | Admitting: Cardiology

## 2017-03-04 DIAGNOSIS — G2581 Restless legs syndrome: Secondary | ICD-10-CM | POA: Diagnosis not present

## 2017-03-04 DIAGNOSIS — D630 Anemia in neoplastic disease: Secondary | ICD-10-CM | POA: Diagnosis not present

## 2017-03-04 DIAGNOSIS — I10 Essential (primary) hypertension: Secondary | ICD-10-CM | POA: Diagnosis not present

## 2017-03-04 DIAGNOSIS — E782 Mixed hyperlipidemia: Secondary | ICD-10-CM | POA: Diagnosis not present

## 2017-03-04 DIAGNOSIS — Z23 Encounter for immunization: Secondary | ICD-10-CM | POA: Diagnosis not present

## 2017-03-04 DIAGNOSIS — I2510 Atherosclerotic heart disease of native coronary artery without angina pectoris: Secondary | ICD-10-CM | POA: Diagnosis not present

## 2017-03-04 DIAGNOSIS — Z681 Body mass index (BMI) 19 or less, adult: Secondary | ICD-10-CM | POA: Diagnosis not present

## 2017-03-04 DIAGNOSIS — C61 Malignant neoplasm of prostate: Secondary | ICD-10-CM | POA: Diagnosis not present

## 2017-03-11 ENCOUNTER — Ambulatory Visit (INDEPENDENT_AMBULATORY_CARE_PROVIDER_SITE_OTHER): Payer: Medicare Other | Admitting: Cardiology

## 2017-03-11 ENCOUNTER — Encounter: Payer: Self-pay | Admitting: Cardiology

## 2017-03-11 VITALS — BP 128/80 | HR 62 | Ht 70.0 in | Wt 135.4 lb

## 2017-03-11 DIAGNOSIS — I351 Nonrheumatic aortic (valve) insufficiency: Secondary | ICD-10-CM | POA: Diagnosis not present

## 2017-03-11 DIAGNOSIS — E782 Mixed hyperlipidemia: Secondary | ICD-10-CM | POA: Diagnosis not present

## 2017-03-11 DIAGNOSIS — R42 Dizziness and giddiness: Secondary | ICD-10-CM

## 2017-03-11 DIAGNOSIS — I259 Chronic ischemic heart disease, unspecified: Secondary | ICD-10-CM | POA: Diagnosis not present

## 2017-03-11 DIAGNOSIS — I2510 Atherosclerotic heart disease of native coronary artery without angina pectoris: Secondary | ICD-10-CM

## 2017-03-11 MED ORDER — CLOPIDOGREL BISULFATE 75 MG PO TABS: 75.0000 mg | ORAL_TABLET | Freq: Every day | ORAL | 1 refills | Status: DC

## 2017-03-11 NOTE — Progress Notes (Signed)
Cardiology Office Note  Date: 03/11/2017   ID: Amedeo, Detweiler 1940/12/27, MRN [id number]  PCP: Caryl Bis, MD  Primary Cardiologist: Rozann Lesches, MD   Chief Complaint  Patient presents with  . Coronary Artery Disease    History of Present Illness: Russell Browning is a 76 y.o. male last seen in May. He presents today with his wife for a follow-up visit. He states that he has been having episodes of orthostatic lightheadedness. No palpitations or syncope. No chest pain. His only antihypertensive medication is atenolol 12.5 mg daily.  He continues to follow with Dr. Rayann Heman in the device clinic, Medtronic pacemaker in place. Office visit noted in August. Most recent device interrogation showed normal function in September.  We went over his medications. He also tells me that he has been having recurring nosebleeds since being on Brilinta. He tolerated Plavix better in the past.  Last echocardiogram was in 2016 as outlined below. LVEF was 55-60% and there was mild to moderate aortic regurgitation noted.  Orthostatics performed in the office today revealed no clearly diagnostic change in heart rate or blood pressure, although his systolic did drop from 850 down to 115 from supine to standing position and he reported dizziness. His heart rate did not pick up with this.  Past Medical History:  Diagnosis Date  . Anemia   . CAD (coronary artery disease), native coronary artery    a. DES to proximal, mid, and distal RCA 2006. b. 12/2014 - [place] s/p  PTCA to the mid-distal previously placed RCA stents, otherwise nonobstructive CAD for continued medical therapy.  Marland Kitchen History of TIAs   . Mixed hyperlipidemia   . 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 Prostate cancer metastatic to bone ([place])   . Sinus node dysfunction [facility])    Medtronic pacemaker - Dr. Rayann Heman    Past  Surgical History:  Procedure Laterality Date  . CARDIAC CATHETERIZATION N/A 01/06/2015   Procedure: Left Heart Cath and Coronary Angiography;  Surgeon: Troy Sine, MD;  Location: [facility];  Service: Cardiovascular;  Laterality: N/A;  . PACEMAKER INSERTION     MDT    Current Outpatient Prescriptions  Medication Sig Dispense Refill  . aspirin EC 81 MG EC tablet Take 1 tablet (81 mg total) by mouth daily. 30 tablet 11  . Calcium Carbonate-Vitamin D (CALCIUM-VITAMIN D) 500-200 MG-UNIT per tablet Take 1 tablet by mouth daily.    . clonazePAM (KLONOPIN) 1 MG tablet Take 1 mg by mouth at bedtime.     Marland Kitchen denosumab (XGEVA) 120 MG/1.7ML SOLN injection Inject 120 mg into the skin every 6 (six) weeks.    . fentaNYL (DURAGESIC - DOSED MCG/HR) 25 MCG/HR Place 1 patch onto the skin every 3 (three) days.    Marland Kitchen leuprolide (LUPRON) 22.5 MG injection Inject 22.5 mg into the muscle every 3 (three) months.    . levothyroxine (SYNTHROID, LEVOTHROID) 50 MCG tablet Take 1 tablet by mouth daily.    . nitroGLYCERIN (NITROSTAT) 0.4 MG SL tablet Place 1 tablet (0.4 mg total) under the tongue every 5 (five) minutes as needed for chest pain. 25 tablet 3  . oxyCODONE-acetaminophen (PERCOCET) 10-325 MG per tablet Take 1 tablet by mouth daily.     . pantoprazole (PROTONIX) 40 MG tablet Take 40 mg by mouth daily.    . polyethylene glycol (  MIRALAX / GLYCOLAX) packet Take 17 g by mouth daily.    Marland Kitchen senna-docusate (EQ SENNA-S) 8.6-50 MG per tablet Take 2 tablets by mouth daily. 60 tablet 3  . simvastatin (ZOCOR) 40 MG tablet Take 40 mg by mouth at bedtime.      Marland Kitchen venlafaxine XR (EFFEXOR-XR) 150 MG 24 hr capsule Take 150 mg by mouth daily.    . vitamin B-12 (CYANOCOBALAMIN) 1000 MCG tablet Take 1,000 mcg by mouth daily.      . clopidogrel (PLAVIX) 75 MG tablet Take 1 tablet (75 mg total) by mouth daily. 90 tablet 1   No current facility-administered medications for this visit.    Allergies:  Penicillins;  Phenazopyridine hcl; and Ramipril   Social History: The patient  reports that he quit smoking about 51 years ago. His smoking use included Cigarettes. He started smoking about 65 years ago. He has a 5.00 pack-year smoking history. He quit smokeless tobacco use about 38 years ago. His smokeless tobacco use included Chew. He reports that he does not drink alcohol or use drugs.   ROS:  Please see the history of present illness. Otherwise, complete review of systems is positive for intermittent upper shoulder and back discomfort, notices it at nighttime, gets better when he gets up and moves around.  All other systems are reviewed and negative.   Physical Exam: VS:  BP 128/80   Pulse 62   Ht 5\' 10"  (1.778 m)   Wt 135 lb 6.4 oz (61.4 kg)   SpO2 98%   BMI 19.43 kg/m , BMI Body mass index is 19.43 kg/m.  Wt Readings from Last 3 Encounters:  03/11/17 135 lb 6.4 oz (61.4 kg)  03/02/17 135 lb 14.4 oz (61.6 kg)  01/21/17 134 lb (60.8 kg)    General: Elderly male, appears comfortable at rest. HEENT: Conjunctiva and lids normal, oropharynx clear. Neck: Supple, no elevated JVP or carotid bruits, no thyromegaly. Lungs: Clear to auscultation, nonlabored breathing at rest. Cardiac: Regular rate and rhythm, no S3 or significant systolic murmur, soft diastolic murmur, no pericardial rub. Abdomen: Soft, nontender, bowel sounds present, no guarding or rebound. Extremities: No pitting edema, distal pulses 2+. Skin: Warm and dry. Musculoskeletal: No kyphosis. Neuropsychiatric: Alert and oriented x3, affect grossly appropriate.  ECG: I personally reviewed the tracing from 04/19/2016 which showed an atrial paced rhythm.  Recent Labwork: 02/23/2017: ALT 10; AST 18; BUN 14.0; Creatinine 1.1; HGB 10.7; Platelets 154; Potassium 3.5; Sodium 141     Component Value Date/Time   CHOL 171 01/04/2015 0431   TRIG 106 01/04/2015 0431   HDL 42 01/04/2015 0431   CHOLHDL 4.1 01/04/2015 0431   VLDL 21 01/04/2015 0431    LDLCALC 108 (H) 01/04/2015 0431    Other Studies Reviewed Today:  Echocardiogram 04/09/2015: Study Conclusions  - Left ventricle: The cavity size was normal. Wall thickness was   normal. Systolic function was normal. The estimated ejection   fraction was in the range of 55% to 60%. Indeterminate diastolic   function. Wall motion was normal; there were no regional wall   motion abnormalities. - Aortic valve: Mildly calcified annulus. Mildly thickened   leaflets. There was mild to moderate regurgitation. The AI VC is   0.3 cm. Valve area (VTI): 3.02 cm^2. Valve area (Vmax): 2.89   cm^2. Valve area (Vmean): 2.98 cm^2. Regurgitation pressure   half-time: 568 ms. - Mitral valve: There was mild regurgitation. - Left atrium: The atrium was moderately dilated. - Systemic veins: IVC is  small suggesting low RA pressure and   hypovolemia. - Technically difficult study.  Assessment and Plan:  1. CAD status post recurrent RCA PCI, most recently angioplasty of the mid to distal RCA in 2016. Plan will be to continue long-term DAPT, but switch from Brilinta back to Plavix in light of recurring nosebleeds.  2. Orthostatic lightheadedness. No palpitations or syncope. Device function recently normal by interrogation. He did have some dizziness on checking orthostatics today with a nondiagnostic drop in blood pressure, no concurrent pickup in heart rate. We will stop atenolol for now to see if he feels better.  3. Mild to moderate aortic regurgitation, last assessed in 2016. No significant change on examination.  4. Hyperlipidemia, continues on statin therapy with follow-up per Dr. Quillian Quince.  Current medicines were reviewed with the patient today.  Disposition: Follow-up in 3 months.  Signed, Satira Sark, MD, [facility] 03/11/2017 4:25 PM    [place] [facility] [place], [place], [place] [address] Phone: [phone]; Fax: [phone]

## 2017-03-11 NOTE — Patient Instructions (Signed)
Your physician recommends that you schedule a follow-up appointment in: [place] has recommended you make the following change in your medication:   STOP ATENOLOL   STOP BRILINTA  START PLAVIX 75 MG DAILY  Thank you for choosing [facility]!!

## 2017-05-03 NOTE — Progress Notes (Deleted)
Cardiology Office Note  Date: 05/03/2017   ID: Dwight, Adamczak 04/29/1941, MRN [id number]  PCP: Caryl Bis, MD  Primary Cardiologist: Rozann Lesches, MD   No chief complaint on file.   History of Present Illness: Russell Browning is a 76 y.o. male last seen in September.  He follows in the device clinic with Dr. Rayann Heman, Medtronic pacemaker in place.  He has history of CAD with recurrent RCA PCI, most recently angioplasty of the mid to distal RCA in 2016.   Past Medical History:  Diagnosis Date  . Anemia   . CAD (coronary artery disease), native coronary artery    a. DES to proximal, mid, and distal RCA 2006. b. 12/2014 - [place] s/p  PTCA to the mid-distal previously placed RCA stents, otherwise nonobstructive CAD for continued medical therapy.  Marland Kitchen History of TIAs   . Mixed hyperlipidemia   . 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 Prostate cancer metastatic to bone ([place])   . Sinus node dysfunction [facility])    Medtronic pacemaker - Dr. Rayann Heman    Past Surgical History:  Procedure Laterality Date  . Left Heart Cath and Coronary Angiography N/A 01/06/2015   Performed by Troy Sine, MD at [facility]  . PACEMAKER INSERTION     MDT    Current Outpatient Medications  Medication Sig Dispense Refill  . aspirin EC 81 MG EC tablet Take 1 tablet (81 mg total) by mouth daily. 30 tablet 11  . Calcium Carbonate-Vitamin D (CALCIUM-VITAMIN D) 500-200 MG-UNIT per tablet Take 1 tablet by mouth daily.    . clonazePAM (KLONOPIN) 1 MG tablet Take 1 mg by mouth at bedtime.     . clopidogrel (PLAVIX) 75 MG tablet Take 1 tablet (75 mg total) by mouth daily. 90 tablet 1  . denosumab (XGEVA) 120 MG/1.7ML SOLN injection Inject 120 mg into the skin every 6 (six) weeks.    . fentaNYL (DURAGESIC - DOSED MCG/HR) 25 MCG/HR Place 1 patch onto the skin every 3 (three) days.     Marland Kitchen leuprolide (LUPRON) 22.5 MG injection Inject 22.5 mg into the muscle every 3 (three) months.    . levothyroxine (SYNTHROID, LEVOTHROID) 50 MCG tablet Take 1 tablet by mouth daily.    . nitroGLYCERIN (NITROSTAT) 0.4 MG SL tablet Place 1 tablet (0.4 mg total) under the tongue every 5 (five) minutes as needed for chest pain. 25 tablet 3  . oxyCODONE-acetaminophen (PERCOCET) 10-325 MG per tablet Take 1 tablet by mouth daily.     . pantoprazole (PROTONIX) 40 MG tablet Take 40 mg by mouth daily.    . polyethylene glycol (MIRALAX / GLYCOLAX) packet Take 17 g by mouth daily.    Marland Kitchen senna-docusate (EQ SENNA-S) 8.6-50 MG per tablet Take 2 tablets by mouth daily. 60 tablet 3  . simvastatin (ZOCOR) 40 MG tablet Take 40 mg by mouth at bedtime.      Marland Kitchen venlafaxine XR (EFFEXOR-XR) 150 MG 24 hr capsule Take 150 mg by mouth daily.    . vitamin B-12 (CYANOCOBALAMIN) 1000 MCG tablet Take 1,000 mcg by mouth daily.       No current facility-administered medications for this visit.    Allergies:  Penicillins; Phenazopyridine hcl; and Ramipril   Social History: The patient  reports that he quit smoking about 51 years ago. His smoking use included cigarettes.  He started smoking about 65 years ago. He has a 5.00 pack-year smoking history. He quit smokeless tobacco use about 38 years ago. His smokeless tobacco use included chew. He reports that he does not drink alcohol or use drugs.   Family History: [name] family history includes Heart disease in his father and sister.   ROS:  Please see the history of present illness. Otherwise, complete review of systems is positive for {NONE DEFAULTED:18576::"none"}.  All other systems are reviewed and negative.   Physical Exam: VS:  There were no vitals taken for this visit., BMI There is no height or weight on file to calculate BMI.  Wt Readings from Last 3 Encounters:  03/11/17 135 lb 6.4 oz (61.4 kg)  03/02/17 135 lb 14.4 oz (61.6 kg)  01/21/17 134 lb (60.8 kg)      General: Patient appears comfortable at rest. HEENT: Conjunctiva and lids normal, oropharynx clear with moist mucosa. Neck: Supple, no elevated JVP or carotid bruits, no thyromegaly. Lungs: Clear to auscultation, nonlabored breathing at rest. Cardiac: Regular rate and rhythm, no S3 or significant systolic murmur, no pericardial rub. Abdomen: Soft, nontender, no hepatomegaly, bowel sounds present, no guarding or rebound. Extremities: No pitting edema, distal pulses 2+. Skin: Warm and dry. Musculoskeletal: No kyphosis. Neuropsychiatric: Alert and oriented x3, affect grossly appropriate.  ECG: I personally reviewed the tracing from 04/19/2016 which showed an atrial paced rhythm with nonspecific ST changes.  Recent Labwork: 02/23/2017: ALT 10; AST 18; BUN 14.0; Creatinine 1.1; HGB 10.7; Platelets 154; Potassium 3.5; Sodium 141     Component Value Date/Time   CHOL 171 01/04/2015 0431   TRIG 106 01/04/2015 0431   HDL 42 01/04/2015 0431   CHOLHDL 4.1 01/04/2015 0431   VLDL 21 01/04/2015 0431   LDLCALC 108 (H) 01/04/2015 0431    Other Studies Reviewed Today:   Echocardiogram 04/09/2015: Study Conclusions  - Left ventricle: The cavity size was normal. Wall thickness was normal. Systolic function was normal. The estimated ejection fraction was in the range of 55% to 60%. Indeterminate diastolic function. Wall motion was normal; there were no regional wall motion abnormalities. - Aortic valve: Mildly calcified annulus. Mildly thickened leaflets. There was mild to moderate regurgitation. The AI VC is 0.3 cm. Valve area (VTI): 3.02 cm^2. Valve area (Vmax): 2.89 cm^2. Valve area (Vmean): 2.98 cm^2. Regurgitation pressure half-time: 568 ms. - Mitral valve: There was mild regurgitation. - Left atrium: The atrium was moderately dilated. - Systemic veins: IVC is small suggesting low RA pressure and hypovolemia. - Technically difficult study.  Assessment and  Plan:    Current medicines were reviewed with the patient today.  No orders of the defined types were placed in this encounter.   Disposition:  Signed, Satira Sark, MD, [facility] 05/03/2017 8:58 AM    [place] [facility] [place], [place], [place] [address] Phone: [phone]; Fax: [phone]

## 2017-05-04 ENCOUNTER — Ambulatory Visit: Payer: Medicare Other | Admitting: Cardiology

## 2017-05-25 ENCOUNTER — Ambulatory Visit (HOSPITAL_BASED_OUTPATIENT_CLINIC_OR_DEPARTMENT_OTHER): Payer: Medicare Other

## 2017-05-25 ENCOUNTER — Other Ambulatory Visit (HOSPITAL_BASED_OUTPATIENT_CLINIC_OR_DEPARTMENT_OTHER): Payer: Medicare Other

## 2017-05-25 VITALS — BP 100/75 | HR 90 | Temp 98.0°F | Resp 18

## 2017-05-25 DIAGNOSIS — C7951 Secondary malignant neoplasm of bone: Secondary | ICD-10-CM

## 2017-05-25 DIAGNOSIS — C61 Malignant neoplasm of prostate: Secondary | ICD-10-CM | POA: Diagnosis not present

## 2017-05-25 DIAGNOSIS — Z950 Presence of cardiac pacemaker: Secondary | ICD-10-CM

## 2017-05-25 DIAGNOSIS — T82110D Breakdown (mechanical) of cardiac electrode, subsequent encounter: Secondary | ICD-10-CM

## 2017-05-25 DIAGNOSIS — I259 Chronic ischemic heart disease, unspecified: Secondary | ICD-10-CM

## 2017-05-25 DIAGNOSIS — I951 Orthostatic hypotension: Secondary | ICD-10-CM

## 2017-05-25 DIAGNOSIS — I495 Sick sinus syndrome: Secondary | ICD-10-CM

## 2017-05-25 DIAGNOSIS — G459 Transient cerebral ischemic attack, unspecified: Secondary | ICD-10-CM

## 2017-05-25 LAB — CBC WITH DIFFERENTIAL/PLATELET
BASO%: 0.6 % (ref 0.0–2.0)
Basophils Absolute: 0 10*3/uL (ref 0.0–0.1)
EOS%: 4.4 % (ref 0.0–7.0)
Eosinophils Absolute: 0.2 10*3/uL (ref 0.0–0.5)
HCT: 37.1 % — ABNORMAL LOW (ref 38.4–49.9)
HGB: 12 g/dL — ABNORMAL LOW (ref 13.0–17.1)
LYMPH#: 1.8 10*3/uL (ref 0.9–3.3)
LYMPH%: 35.1 % (ref 14.0–49.0)
MCH: 29.4 pg (ref 27.2–33.4)
MCHC: 32.3 g/dL (ref 32.0–36.0)
MCV: 90.9 fL (ref 79.3–98.0)
MONO#: 0.6 10*3/uL (ref 0.1–0.9)
MONO%: 11.9 % (ref 0.0–14.0)
NEUT#: 2.5 10*3/uL (ref 1.5–6.5)
NEUT%: 48 % (ref 39.0–75.0)
Platelets: 164 10*3/uL (ref 140–400)
RBC: 4.08 10*6/uL — AB (ref 4.20–5.82)
RDW: 13.6 % (ref 11.0–14.6)
WBC: 5.2 10*3/uL (ref 4.0–10.3)

## 2017-05-25 LAB — COMPREHENSIVE METABOLIC PANEL
ALBUMIN: 4.2 g/dL (ref 3.5–5.0)
ALK PHOS: 17 U/L — AB (ref 40–150)
ALT: 10 U/L (ref 0–55)
AST: 16 U/L (ref 5–34)
Anion Gap: 8 mEq/L (ref 3–11)
BILIRUBIN TOTAL: 0.25 mg/dL (ref 0.20–1.20)
BUN: 13.8 mg/dL (ref 7.0–26.0)
CO2: 26 meq/L (ref 22–29)
CREATININE: 1.1 mg/dL (ref 0.7–1.3)
Calcium: 9.4 mg/dL (ref 8.4–10.4)
Chloride: 105 mEq/L (ref 98–109)
GLUCOSE: 69 mg/dL — AB (ref 70–140)
Potassium: 4.5 mEq/L (ref 3.5–5.1)
SODIUM: 140 meq/L (ref 136–145)
TOTAL PROTEIN: 7 g/dL (ref 6.4–8.3)

## 2017-05-25 MED ORDER — DENOSUMAB 120 MG/1.7ML ~~LOC~~ SOLN
120.0000 mg | Freq: Once | SUBCUTANEOUS | Status: AC
  Administered 2017-05-25: 120 mg via SUBCUTANEOUS

## 2017-05-25 NOTE — Patient Instructions (Signed)
Denosumab injection  What is this medicine?  DENOSUMAB (den oh sue mab) slows bone breakdown. Prolia is used to treat osteoporosis in women after menopause and in men. Xgeva is used to prevent bone fractures and other bone problems caused by cancer bone metastases. Xgeva is also used to treat giant cell tumor of the bone.  This medicine may be used for other purposes; ask your health care provider or pharmacist if you have questions.  What should I tell my health care provider before I take this medicine?  They need to know if you have any of these conditions:  -dental disease  -eczema  -infection or history of infections  -kidney disease or on dialysis  -low blood calcium or vitamin D  -malabsorption syndrome  -scheduled to have surgery or tooth extraction  -taking medicine that contains denosumab  -thyroid or parathyroid disease  -an unusual reaction to denosumab, other medicines, foods, dyes, or preservatives  -pregnant or trying to get pregnant  -breast-feeding  How should I use this medicine?  This medicine is for injection under the skin. It is given by a health care professional in a hospital or clinic setting.  If you are getting Prolia, a special MedGuide will be given to you by the pharmacist with each prescription and refill. Be sure to read this information carefully each time.  For Prolia, talk to your pediatrician regarding the use of this medicine in children. Special care may be needed. For Xgeva, talk to your pediatrician regarding the use of this medicine in children. While this drug may be prescribed for children as young as 13 years for selected conditions, precautions do apply.  Overdosage: If you think you have taken too much of this medicine contact a poison control center or emergency room at once.  NOTE: This medicine is only for you. Do not share this medicine with others.  What if I miss a dose?  It is important not to miss your dose. Call your doctor or health care professional if you are  unable to keep an appointment.  What may interact with this medicine?  Do not take this medicine with any of the following medications:  -other medicines containing denosumab  This medicine may also interact with the following medications:  -medicines that suppress the immune system  -medicines that treat cancer  -steroid medicines like prednisone or cortisone  This list may not describe all possible interactions. Give your health care provider a list of all the medicines, herbs, non-prescription drugs, or dietary supplements you use. Also tell them if you smoke, drink alcohol, or use illegal drugs. Some items may interact with your medicine.  What should I watch for while using this medicine?  Visit your doctor or health care professional for regular checks on your progress. Your doctor or health care professional may order blood tests and other tests to see how you are doing.  Call your doctor or health care professional if you get a cold or other infection while receiving this medicine. Do not treat yourself. This medicine may decrease your body's ability to fight infection.  You should make sure you get enough calcium and vitamin D while you are taking this medicine, unless your doctor tells you not to. Discuss the foods you eat and the vitamins you take with your health care professional.  See your dentist regularly. Brush and floss your teeth as directed. Before you have any dental work done, tell your dentist you are receiving this medicine.  Do   not become pregnant while taking this medicine or for 5 months after stopping it. Women should inform their doctor if they wish to become pregnant or think they might be pregnant. There is a potential for serious side effects to an unborn child. Talk to your health care professional or pharmacist for more information.  What side effects may I notice from receiving this medicine?  Side effects that you should report to your doctor or health care professional as soon as  possible:  -allergic reactions like skin rash, itching or hives, swelling of the face, lips, or tongue  -breathing problems  -chest pain  -fast, irregular heartbeat  -feeling faint or lightheaded, falls  -fever, chills, or any other sign of infection  -muscle spasms, tightening, or twitches  -numbness or tingling  -skin blisters or bumps, or is dry, peels, or red  -slow healing or unexplained pain in the mouth or jaw  -unusual bleeding or bruising  Side effects that usually do not require medical attention (Report these to your doctor or health care professional if they continue or are bothersome.):  -muscle pain  -stomach upset, gas  This list may not describe all possible side effects. Call your doctor for medical advice about side effects. You may report side effects to FDA at 1-800-FDA-1088.  Where should I keep my medicine?  This medicine is only given in a clinic, doctor's office, or other health care setting and will not be stored at home.  NOTE: This sheet is a summary. It may not cover all possible information. If you have questions about this medicine, talk to your doctor, pharmacist, or health care provider.      2016, [employer]vier/[employer] Standard. (2011-11-29 12:37:47)

## 2017-05-26 LAB — PSA: PROSTATE SPECIFIC AG, SERUM: 0.2 ng/mL (ref 0.0–4.0)

## 2017-05-30 DIAGNOSIS — Z79891 Long term (current) use of opiate analgesic: Secondary | ICD-10-CM | POA: Diagnosis not present

## 2017-05-30 DIAGNOSIS — Z681 Body mass index (BMI) 19 or less, adult: Secondary | ICD-10-CM | POA: Diagnosis not present

## 2017-05-30 DIAGNOSIS — I10 Essential (primary) hypertension: Secondary | ICD-10-CM | POA: Diagnosis not present

## 2017-05-30 DIAGNOSIS — E782 Mixed hyperlipidemia: Secondary | ICD-10-CM | POA: Diagnosis not present

## 2017-05-30 DIAGNOSIS — C61 Malignant neoplasm of prostate: Secondary | ICD-10-CM | POA: Diagnosis not present

## 2017-05-30 DIAGNOSIS — D630 Anemia in neoplastic disease: Secondary | ICD-10-CM | POA: Diagnosis not present

## 2017-05-30 DIAGNOSIS — G2581 Restless legs syndrome: Secondary | ICD-10-CM | POA: Diagnosis not present

## 2017-05-30 DIAGNOSIS — I2510 Atherosclerotic heart disease of native coronary artery without angina pectoris: Secondary | ICD-10-CM | POA: Diagnosis not present

## 2017-05-31 ENCOUNTER — Ambulatory Visit (INDEPENDENT_AMBULATORY_CARE_PROVIDER_SITE_OTHER): Payer: Medicare Other | Admitting: *Deleted

## 2017-05-31 ENCOUNTER — Telehealth: Payer: Self-pay | Admitting: Cardiology

## 2017-05-31 DIAGNOSIS — I495 Sick sinus syndrome: Secondary | ICD-10-CM

## 2017-05-31 NOTE — Telephone Encounter (Signed)
Spoke with pt and reminded pt of remote transmission that is due today. Pt verbalized understanding.   

## 2017-06-01 ENCOUNTER — Encounter: Payer: Self-pay | Admitting: Cardiology

## 2017-06-01 ENCOUNTER — Ambulatory Visit (HOSPITAL_BASED_OUTPATIENT_CLINIC_OR_DEPARTMENT_OTHER): Payer: Medicare Other

## 2017-06-01 ENCOUNTER — Telehealth: Payer: Self-pay | Admitting: Oncology

## 2017-06-01 ENCOUNTER — Ambulatory Visit (HOSPITAL_BASED_OUTPATIENT_CLINIC_OR_DEPARTMENT_OTHER): Payer: Medicare Other | Admitting: Oncology

## 2017-06-01 VITALS — BP 132/69 | HR 89 | Temp 98.0°F | Resp 18 | Ht 70.0 in | Wt 138.0 lb

## 2017-06-01 DIAGNOSIS — Z5111 Encounter for antineoplastic chemotherapy: Secondary | ICD-10-CM

## 2017-06-01 DIAGNOSIS — C61 Malignant neoplasm of prostate: Secondary | ICD-10-CM | POA: Diagnosis not present

## 2017-06-01 DIAGNOSIS — M898X9 Other specified disorders of bone, unspecified site: Secondary | ICD-10-CM | POA: Diagnosis not present

## 2017-06-01 DIAGNOSIS — C7951 Secondary malignant neoplasm of bone: Secondary | ICD-10-CM

## 2017-06-01 DIAGNOSIS — E291 Testicular hypofunction: Secondary | ICD-10-CM | POA: Diagnosis not present

## 2017-06-01 MED ORDER — LEUPROLIDE ACETATE (3 MONTH) 22.5 MG IM KIT
22.5000 mg | PACK | Freq: Once | INTRAMUSCULAR | Status: AC
  Administered 2017-06-01: 22.5 mg via INTRAMUSCULAR
  Filled 2017-06-01: qty 22.5

## 2017-06-01 NOTE — Telephone Encounter (Signed)
Scheduled apt per 12/19 los - Gave patient AVS and calender per los.

## 2017-06-01 NOTE — Patient Instructions (Signed)
Leuprolide depot injection What is this medicine? LEUPROLIDE (loo PROE lide) is a man-made protein that acts like a natural hormone in the body. It decreases testosterone in men and decreases estrogen in women. In men, this medicine is used to treat advanced prostate cancer. In women, some forms of this medicine may be used to treat endometriosis, uterine fibroids, or other male hormone-related problems. This medicine may be used for other purposes; ask your health care provider or pharmacist if you have questions. COMMON BRAND NAME(S): Eligard, Lupron Depot, Lupron Depot-Ped, Viadur What should I tell my health care provider before I take this medicine? They need to know if you have any of these conditions: -diabetes -heart disease or previous heart attack -high blood pressure -high cholesterol -mental illness -osteoporosis -pain or difficulty passing urine -seizures -spinal cord metastasis -stroke -suicidal thoughts, plans, or attempt; a previous suicide attempt by you or a family member -tobacco smoker -unusual vaginal bleeding (women) -an unusual or allergic reaction to leuprolide, benzyl alcohol, other medicines, foods, dyes, or preservatives -pregnant or trying to get pregnant -breast-feeding How should I use this medicine? This medicine is for injection into a muscle or for injection under the skin. It is given by a health care professional in a hospital or clinic setting. The specific product will determine how it will be given to you. Make sure you understand which product you receive and how often you will receive it. Talk to your pediatrician regarding the use of this medicine in children. Special care may be needed. Overdosage: If you think you have taken too much of this medicine contact a poison control center or emergency room at once. NOTE: This medicine is only for you. Do not share this medicine with others. What if I miss a dose? It is important not to miss a dose.  Call your doctor or health care professional if you are unable to keep an appointment. Depot injections: Depot injections are given either once-monthly, every 12 weeks, every 16 weeks, or every 24 weeks depending on the product you are prescribed. The product you are prescribed will be based on if you are male or male, and your condition. Make sure you understand your product and dosing. What may interact with this medicine? Do not take this medicine with any of the following medications: -chasteberry This medicine may also interact with the following medications: -herbal or dietary supplements, like black cohosh or DHEA -male hormones, like estrogens or progestins and birth control pills, patches, rings, or injections -male hormones, like testosterone This list may not describe all possible interactions. Give your health care provider a list of all the medicines, herbs, non-prescription drugs, or dietary supplements you use. Also tell them if you smoke, drink alcohol, or use illegal drugs. Some items may interact with your medicine. What should I watch for while using this medicine? Visit your doctor or health care professional for regular checks on your progress. During the first weeks of treatment, your symptoms may get worse, but then will improve as you continue your treatment. You may get hot flashes, increased bone pain, increased difficulty passing urine, or an aggravation of nerve symptoms. Discuss these effects with your doctor or health care professional, some of them may improve with continued use of this medicine. Male patients may experience a menstrual cycle or spotting during the first months of therapy with this medicine. If this continues, contact your doctor or health care professional. What side effects may I notice from receiving this medicine? Side   effects that you should report to your doctor or health care professional as soon as possible: -allergic reactions like skin  rash, itching or hives, swelling of the face, lips, or tongue -breathing problems -chest pain -depression or memory disorders -pain in your legs or groin -pain at site where injected or implanted -seizures -severe headache -swelling of the feet and legs -suicidal thoughts or other mood changes -visual changes -vomiting Side effects that usually do not require medical attention (report to your doctor or health care professional if they continue or are bothersome): -breast swelling or tenderness -decrease in sex drive or performance -diarrhea -hot flashes -loss of appetite -muscle, joint, or bone pains -nausea -redness or irritation at site where injected or implanted -skin problems or acne This list may not describe all possible side effects. Call your doctor for medical advice about side effects. You may report side effects to FDA at 1-800-FDA-1088. Where should I keep my medicine? This drug is given in a hospital or clinic and will not be stored at home. NOTE: This sheet is a summary. It may not cover all possible information. If you have questions about this medicine, talk to your doctor, pharmacist, or health care provider.  2018 Elsevier/Gold Standard (2015-11-13 09:45:53)  

## 2017-06-01 NOTE — Progress Notes (Signed)
Hematology and Oncology Follow Up Visit  Russell Browning [id number] 1941/05/23 76 y.o. 06/01/2017 9:30 AM    Principle Diagnosis:  76 year old gentleman with  advanced prostate cancer. He has metastatic disease to the bone. It appears to be hormone sensitive at this time. His cancer was inially diagnosed in 2000.    Prior Therapy: 1. He received radiation therapy and 2 years of Lupron, but it presumably was locally advanced prostate cancer at the time of diagnosis in 2000.  2. PSA was under control from 2001 up until the early parts of 2012, where his PSA rose up to 16.9. At that time, he was under the care of Dr. 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 He subsequently started on  Lupron every 3 months.  3. He is S/P radiation therapy directed to the right hip (30 gray in 12 fractions) concluded in 07/2011 for palliative purposes conducted in [place].    Current therapy:  1. Lupron 22.5 mg every three months.  2. Delton See given every 3 months started in 10/2011.   Interim History:  Russell Browning presents today for a follow up visit. Since his last visit, he reports no changes in his health.  He continues to do well and have gained weight since last visit.  He feels that he is eating better and using nutritional supplements.  He does not report any new bone pain or pathological fracture. He continues to use fentanyl patch with very little breakthrough pain medication. He continues to tolerate Lupron and Xgeva without any side effects.  Hot flashes have been manageable without any major changes.  He denied any urinary symptoms at this time. He denied any decline in his energy her performance status.  His quality of life remain excellent.  He does not report any headaches, blurry vision, or seizures. He does not report any fevers or chills or sweats. He does not report any cough, wheezing or hemoptysis. Is not  reporting any chest pain or difficulty breathing. Does not report any dyspnea on exertion or leg edema. Has not reported any nausea or vomiting. Has not reported any palpitation or leg edema. He is not reporting any change in his bowel habits. Has not reported any urinary symptoms. Remainder of his review of systems unremarkable.   Medications: I have reviewed the patient's current medications.   Current Outpatient Medications  Medication Sig Dispense Refill  . aspirin EC 81 MG EC tablet Take 1 tablet (81 mg total) by mouth daily. 30 tablet 11  . Calcium Carbonate-Vitamin D (CALCIUM-VITAMIN D) 500-200 MG-UNIT per tablet Take 1 tablet by mouth daily.    . clonazePAM (KLONOPIN) 1 MG tablet Take 1 mg by mouth at bedtime.     . clopidogrel (PLAVIX) 75 MG tablet Take 1 tablet (75 mg total) by mouth daily. 90 tablet 1  . denosumab (XGEVA) 120 MG/1.7ML SOLN injection Inject 120 mg into the skin every 6 (six) weeks.    . fentaNYL (DURAGESIC - DOSED MCG/HR) 25 MCG/HR Place 1 patch onto the skin every 3 (three) days.    Marland Kitchen leuprolide (LUPRON) 22.5 MG injection Inject 22.5 mg into the muscle every 3 (three) months.    . levothyroxine (SYNTHROID, LEVOTHROID) 50 MCG tablet Take 1 tablet by mouth daily.    . nitroGLYCERIN (NITROSTAT) 0.4 MG SL tablet Place 1 tablet (0.4 mg total) under the tongue every 5 (five) minutes  as needed for chest pain. 25 tablet 3  . oxyCODONE-acetaminophen (PERCOCET) 10-325 MG per tablet Take 1 tablet by mouth daily.     . pantoprazole (PROTONIX) 40 MG tablet Take 40 mg by mouth daily.    . polyethylene glycol (MIRALAX / GLYCOLAX) packet Take 17 g by mouth daily.    Marland Kitchen senna-docusate (EQ SENNA-S) 8.6-50 MG per tablet Take 2 tablets by mouth daily. 60 tablet 3  . simvastatin (ZOCOR) 40 MG tablet Take 40 mg by mouth at bedtime.      Marland Kitchen venlafaxine XR (EFFEXOR-XR) 150 MG 24 hr capsule Take 150 mg by mouth daily.    . vitamin B-12 (CYANOCOBALAMIN) 1000 MCG tablet Take 1,000 mcg by mouth  daily.       No current facility-administered medications for this visit.    Facility-Administered Medications Ordered in Other Visits  Medication Dose Route Frequency Provider Last Rate Last Dose  . leuprolide (LUPRON) injection 22.5 mg  22.5 mg Intramuscular Once Russell Portela, MD         Allergies:  Allergies  Allergen Reactions  . Penicillins Other (See Comments)    Passed out  . Phenazopyridine Hcl Hives  . Ramipril Hives    Past Medical History, Surgical history, Social history, and Family History were reviewed and updated.   Physical Exam: Blood pressure 132/69, pulse 89, temperature 98 F (36.7 C), temperature source Oral, resp. rate 18, height 5\' 10"  (1.778 m), weight 138 lb (62.6 kg), SpO2 100 %. ECOG: 1 General appearance: Alert, awake gentleman appeared comfortable. Head: Normocephalic, without obvious abnormality no oral ulcers or lesions. Neck: no adenopathy no thyroid masses. Lymph nodes: Cervical, supraclavicular, and axillary nodes normal. Heart:regular rate and rhythm, S1, S2 normal, no murmur, click, rub or gallop Lung:chest clear, no wheezing, rales, normal symmetric air entry Abdomin: soft, non-tender, without masses or organomegaly no sting dullness or ascites. EXT:no erythema, induration, or nodules   Lab Results: Lab Results  Component Value Date   WBC 5.2 05/25/2017   HGB 12.0 (L) 05/25/2017   HCT 37.1 (L) 05/25/2017   MCV 90.9 05/25/2017   PLT 164 05/25/2017       Results for Russell Browning (MRN [id number]) as of 06/01/2017 09:22  Ref. Range 02/23/2017 08:06 05/25/2017 08:00  Prostate Specific Ag, Serum Latest Ref Range: 0.0 - 4.0 ng/mL 0.1 0.2         Impression and Plan:   76 year old gentleman with the following issues:  1. 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  He is currently receiving androgen deprivation therapy since 2012.  His disease status continues to be stable without any clinical  signs or symptoms of disease progression.  PSA showing slow rise and currently at 0.2.  He could be developing castration resistant disease in the future and additional therapy may be needed.  For the time being we will continue with androgen deprivation therapy only.    2. Bone pain:  He continues to be on fentanyl patch without exacerbation of his pain. This is prescribed by his primary care physician and had not required any additional pain medication.  3. Bone directed therapy: He is receiving Xgeva every 3 months for better tolerance.  No change in the schedule at this time.  4. Androgen depravation: He will continue Lupron 22.5 mg every 3 months and this will be given on 06/01/2017.  Will be repeated in 3 months.  5. Follow up: He will return on 08/24/2017 for laboratory  test and Xgeva.  He will have an MD follow-up and Lupron on August 31, 2017.   Zola Button, MD 12/19/20189:30 AM

## 2017-06-01 NOTE — Progress Notes (Signed)
Remote pacemaker transmission.   

## 2017-06-02 LAB — CUP PACEART REMOTE DEVICE CHECK
Battery Remaining Longevity: 28 mo
Battery Voltage: 2.72 V
Brady Statistic AS VP Percent: 0 %
Implantable Lead Implant Date: 19990408
Implantable Lead Location: 753859
Implantable Lead Model: 4524
Implantable Pulse Generator Implant Date: 20081126
Lead Channel Pacing Threshold Amplitude: 2 V
Lead Channel Pacing Threshold Pulse Width: 0.4 ms
Lead Channel Setting Pacing Amplitude: 2.5 V
Lead Channel Setting Sensing Sensitivity: 5.6 mV
MDC IDC LEAD IMPLANT DT: 19990408
MDC IDC LEAD LOCATION: 753860
MDC IDC LEAD SERIAL: 208202
MDC IDC MSMT BATTERY IMPEDANCE: 2170 Ohm
MDC IDC MSMT LEADCHNL RA IMPEDANCE VALUE: 138 Ohm
MDC IDC MSMT LEADCHNL RV IMPEDANCE VALUE: 1077 Ohm
MDC IDC SESS DTM: 20181218195212
MDC IDC SET LEADCHNL RA PACING AMPLITUDE: 2 V
MDC IDC SET LEADCHNL RV PACING PULSEWIDTH: 0.76 ms
MDC IDC STAT BRADY AP VP PERCENT: 0 %
MDC IDC STAT BRADY AP VS PERCENT: 67 %
MDC IDC STAT BRADY AS VS PERCENT: 33 %

## 2017-06-13 ENCOUNTER — Ambulatory Visit: Payer: Medicare Other | Admitting: Cardiology

## 2017-06-15 ENCOUNTER — Ambulatory Visit (INDEPENDENT_AMBULATORY_CARE_PROVIDER_SITE_OTHER): Payer: Medicare Other | Admitting: Cardiology

## 2017-06-15 ENCOUNTER — Telehealth: Payer: Self-pay | Admitting: Cardiology

## 2017-06-15 ENCOUNTER — Encounter: Payer: Self-pay | Admitting: Cardiology

## 2017-06-15 ENCOUNTER — Other Ambulatory Visit: Payer: Self-pay

## 2017-06-15 VITALS — BP 119/76 | HR 87 | Ht 70.0 in | Wt 135.8 lb

## 2017-06-15 DIAGNOSIS — I209 Angina pectoris, unspecified: Secondary | ICD-10-CM | POA: Diagnosis not present

## 2017-06-15 DIAGNOSIS — E782 Mixed hyperlipidemia: Secondary | ICD-10-CM

## 2017-06-15 DIAGNOSIS — I351 Nonrheumatic aortic (valve) insufficiency: Secondary | ICD-10-CM | POA: Diagnosis not present

## 2017-06-15 DIAGNOSIS — R42 Dizziness and giddiness: Secondary | ICD-10-CM | POA: Diagnosis not present

## 2017-06-15 DIAGNOSIS — I25119 Atherosclerotic heart disease of native coronary artery with unspecified angina pectoris: Secondary | ICD-10-CM | POA: Diagnosis not present

## 2017-06-15 NOTE — Patient Instructions (Signed)
Medication Instructions:  Your physician recommends that you continue on your current medications as directed. Please refer to the Current Medication list given to you today.  Labwork: NONE  Testing/Procedures: Your physician has requested that you have a lexiscan myoview. For further information please visit [email or web address]. Please follow instruction sheet, as given.  Follow-Up: Your physician recommends that you schedule a follow-up appointment PENDING TEST RESULTS  Any Other Special Instructions Will Be Listed Below (If Applicable).  If you need a refill on your cardiac medications before your next appointment, please call your pharmacy. 

## 2017-06-15 NOTE — Telephone Encounter (Signed)
Pre-cert Verification for the following procedure   Lexiscan myoview scheduled for 06/21/17 at [facility]

## 2017-06-15 NOTE — Progress Notes (Signed)
Cardiology Office Note  Date: 06/15/2017   ID: Russell Browning, DOB March 25, 1941, MRN [id number]  PCP: Caryl Bis, MD  Primary Cardiologist: Rozann Lesches, MD   Chief Complaint  Patient presents with  . Coronary Artery Disease    History of Present Illness: Russell Browning is a 77 y.o. male last seen in September 2018. He presents to the office for a follow-up visit. States that he has had some left shoulder and arm discomfort, started a few weeks ago. He cannot identify any specific precipitant. Thought that he might have pulled a muscle but had not done anything unusual. Hard to get a sense of whether this is exertional or not. He also describes a soreness in his shoulder but indicates that these symptoms are similar to what he experienced back in 2016 with coronary intervention. He has not used nitroglycerin.  I personally reviewed his ECG today which shows sinus rhythm. He states that he has been taking his cardiac medications regularly.  He continues to follow in the device clinic with Dr. Rayann Heman, Medtronic pacemaker in place.  Past Medical History:  Diagnosis Date  . Anemia   . CAD (coronary artery disease), native coronary artery    a. DES to proximal, mid, and distal RCA 2006. b. 12/2014 - [place] s/p  PTCA to the mid-distal previously placed RCA stents, otherwise nonobstructive CAD for continued medical therapy.  Marland Kitchen History of TIAs   . Mixed hyperlipidemia   . 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 Prostate cancer metastatic to bone ([place])   . Sinus node dysfunction [facility])    Medtronic pacemaker - Dr. Rayann Heman    Past Surgical History:  Procedure Laterality Date  . CARDIAC CATHETERIZATION N/A 01/06/2015   Procedure: Left Heart Cath and Coronary Angiography;  Surgeon: Troy Sine, MD;  Location: [place] [facility];  Service: Cardiovascular;  Laterality: N/A;    . PACEMAKER INSERTION     MDT    Current Outpatient Medications  Medication Sig Dispense Refill  . aspirin EC 81 MG EC tablet Take 1 tablet (81 mg total) by mouth daily. 30 tablet 11  . Calcium Carbonate-Vitamin D (CALCIUM-VITAMIN D) 500-200 MG-UNIT per tablet Take 1 tablet by mouth daily.    . clonazePAM (KLONOPIN) 1 MG tablet Take 1 mg by mouth at bedtime.     . clopidogrel (PLAVIX) 75 MG tablet Take 1 tablet (75 mg total) by mouth daily. 90 tablet 1  . denosumab (XGEVA) 120 MG/1.7ML SOLN injection Inject 120 mg into the skin every 6 (six) weeks.    . fentaNYL (DURAGESIC - DOSED MCG/HR) 25 MCG/HR Place 1 patch onto the skin every 3 (three) days.    Marland Kitchen leuprolide (LUPRON) 22.5 MG injection Inject 22.5 mg into the muscle every 3 (three) months.    . levothyroxine (SYNTHROID, LEVOTHROID) 50 MCG tablet Take 1 tablet by mouth daily.    . nitroGLYCERIN (NITROSTAT) 0.4 MG SL tablet Place 1 tablet (0.4 mg total) under the tongue every 5 (five) minutes as needed for chest pain. 25 tablet 3  . oxyCODONE-acetaminophen (PERCOCET) 10-325 MG per tablet Take 1 tablet by mouth daily.     . pantoprazole (PROTONIX) 40 MG tablet Take 40 mg by mouth daily.    . polyethylene glycol (MIRALAX / GLYCOLAX) packet Take 17 g by mouth daily.    Marland Kitchen senna-docusate (EQ  SENNA-S) 8.6-50 MG per tablet Take 2 tablets by mouth daily. 60 tablet 3  . simvastatin (ZOCOR) 40 MG tablet Take 40 mg by mouth at bedtime.      Marland Kitchen venlafaxine XR (EFFEXOR-XR) 150 MG 24 hr capsule Take 150 mg by mouth daily.    . vitamin B-12 (CYANOCOBALAMIN) 1000 MCG tablet Take 1,000 mcg by mouth daily.       No current facility-administered medications for this visit.    Allergies:  Penicillins; Phenazopyridine hcl; and Ramipril   Social History: The patient  reports that he quit smoking about 52 years ago. His smoking use included cigarettes. He started smoking about 65 years ago. He has a 5.00 pack-year smoking history. He quit smokeless tobacco  use about 39 years ago. His smokeless tobacco use included chew. He reports that he does not drink alcohol or use drugs.   ROS:  Please see the history of present illness. Otherwise, complete review of systems is positive for none.  All other systems are reviewed and negative.   Physical Exam: VS:  BP 119/76   Pulse 87   Ht 5\' 10"  (1.778 m)   Wt 135 lb 12.8 oz (61.6 kg)   BMI 19.49 kg/m , BMI Body mass index is 19.49 kg/m.  Wt Readings from Last 3 Encounters:  06/15/17 135 lb 12.8 oz (61.6 kg)  06/01/17 138 lb (62.6 kg)  03/11/17 135 lb 6.4 oz (61.4 kg)    General: Patient appears comfortable at rest. HEENT: Conjunctiva and lids normal, oropharynx clear. Neck: Supple, no elevated JVP or carotid bruits, no thyromegaly. Lungs: Clear to auscultation, nonlabored breathing at rest. Cardiac: Regular rate and rhythm, no S3 or significant systolic murmur, no pericardial rub. Abdomen: Soft, nontender, bowel sounds present. Extremities: No pitting edema, distal pulses 2+. Skin: Warm and dry. Musculoskeletal: No kyphosis. Neuropsychiatric: Alert and oriented x3, affect grossly appropriate.  ECG: I personally reviewed the tracing from 04/19/2016 which showed an atrial paced rhythm with nonspecific ST changes.  Recent Labwork: 05/25/2017: ALT 10; AST 16; BUN 13.8; Creatinine 1.1; HGB 12.0; Platelets 164; Potassium 4.5; Sodium 140     Component Value Date/Time   CHOL 171 01/04/2015 0431   TRIG 106 01/04/2015 0431   HDL 42 01/04/2015 0431   CHOLHDL 4.1 01/04/2015 0431   VLDL 21 01/04/2015 0431   LDLCALC 108 (H) 01/04/2015 0431    Other Studies Reviewed Today:  Echocardiogram 04/09/2015: Study Conclusions  - Left ventricle: The cavity size was normal. Wall thickness was   normal. Systolic function was normal. The estimated ejection   fraction was in the range of 55% to 60%. Indeterminate diastolic   function. Wall motion was normal; there were no regional wall   motion  abnormalities. - Aortic valve: Mildly calcified annulus. Mildly thickened   leaflets. There was mild to moderate regurgitation. The AI VC is   0.3 cm. Valve area (VTI): 3.02 cm^2. Valve area (Vmax): 2.89   cm^2. Valve area (Vmean): 2.98 cm^2. Regurgitation pressure   half-time: 568 ms. - Mitral valve: There was mild regurgitation. - Left atrium: The atrium was moderately dilated. - Systemic veins: IVC is small suggesting low RA pressure and   hypovolemia. - Technically difficult study.  Assessment and Plan:  1. CAD with history of RCA intervention, most recently angioplasty of the mid to distal RCA in 2016 in previous placed stent site. He is reporting left shoulder and arm symptoms, atypical in description but remind him somewhat of symptoms from 2016. We  will obtain a [facility] medical therapy for reevaluation.  2. Mild to moderate aortic regurgitation, asymptomatic. Will consider follow-up echocardiogram this year.  3. Hyperlipidemia, continues on Zocor.  4. History of orthostatic lightheadedness, presently resolved. Blood pressure is normal today.  Current medicines were reviewed with the patient today.   Orders Placed This Encounter  Procedures  . NM Myocar Multi W/Spect W/Wall Motion / EF  . EKG 12-Lead    Disposition: Call with test results and determine next step.  Signed, Satira Sark, MD, [facility] 06/15/2017 3:43 PM    [facility] [place], [place], [place] [address] Phone: [phone]; Fax: [phone]

## 2017-06-21 ENCOUNTER — Encounter (HOSPITAL_COMMUNITY): Payer: Self-pay

## 2017-06-21 ENCOUNTER — Encounter (HOSPITAL_COMMUNITY)
Admission: RE | Admit: 2017-06-21 | Discharge: 2017-06-21 | Disposition: A | Discharge status: Home / Self Care | Payer: Medicare Other | Source: Ambulatory Visit | Attending: Cardiology | Admitting: Cardiology

## 2017-06-21 ENCOUNTER — Encounter (HOSPITAL_BASED_OUTPATIENT_CLINIC_OR_DEPARTMENT_OTHER)
Admission: RE | Admit: 2017-06-21 | Discharge: 2017-06-21 | Disposition: A | Discharge status: Home / Self Care | Payer: Medicare Other | Source: Ambulatory Visit | Attending: Cardiology | Admitting: Cardiology

## 2017-06-21 DIAGNOSIS — I25119 Atherosclerotic heart disease of native coronary artery with unspecified angina pectoris: Secondary | ICD-10-CM | POA: Insufficient documentation

## 2017-06-21 LAB — NM MYOCAR MULTI W/SPECT W/WALL MOTION / EF
CHL CUP NUCLEAR SSS: 4
LHR: 0.35
LVDIAVOL: 70 mL (ref 62–150)
LVSYSVOL: 31 mL
NUC STRESS TID: 0.79
Peak HR: 82 {beats}/min
Rest HR: 56 {beats}/min
SDS: 3
SRS: 1

## 2017-06-21 MED ORDER — TECHNETIUM TC 99M TETROFOSMIN IV KIT
30.0000 | PACK | Freq: Once | INTRAVENOUS | Status: AC | PRN
  Administered 2017-06-21: 33 via INTRAVENOUS

## 2017-06-21 MED ORDER — TECHNETIUM TC 99M TETROFOSMIN IV KIT
10.0000 | PACK | Freq: Once | INTRAVENOUS | Status: AC | PRN
  Administered 2017-06-21: 11 via INTRAVENOUS

## 2017-06-21 MED ORDER — SODIUM CHLORIDE 0.9% FLUSH
INTRAVENOUS | Status: AC
  Administered 2017-06-21: 10 mL via INTRAVENOUS
  Filled 2017-06-21: qty 10

## 2017-06-21 MED ORDER — REGADENOSON 0.4 MG/5ML IV SOLN
INTRAVENOUS | Status: AC
  Administered 2017-06-21: 0.4 mg via INTRAVENOUS
  Filled 2017-06-21: qty 5

## 2017-06-22 ENCOUNTER — Telehealth: Payer: Self-pay

## 2017-06-22 MED ORDER — ISOSORBIDE MONONITRATE ER 30 MG PO TB24: 15.0000 mg | ORAL_TABLET | Freq: Every day | ORAL | 0 refills | Status: DC

## 2017-06-22 NOTE — Telephone Encounter (Signed)
-----   Message from Merlene Laughter, LPN sent at 11/12/5377 11:23 AM EST -----   ----- Message ----- From: Satira Sark, MD Sent: 06/21/2017   3:44 PM To: Merlene Laughter, LPN  Results reviewed. Myoview is overall low risk but does suggest potentially some degree of restenosis within the RCA distribution. We could try to manage him medically if he would be able to tolerate low-dose Imdur, consider starting and 15 mg daily and see how he does with one month follow-up. Otherwise, cardiac catheterization would be our next step. A copy of this test should be forwarded to Caryl Bis, MD.

## 2017-06-22 NOTE — Telephone Encounter (Signed)
Patient notified. Routed to PCP. New rx sent to pharmacy.

## 2017-07-22 NOTE — Progress Notes (Signed)
Cardiology Office Note  Date: 07/25/2017   ID: Russell Flattery Sr., DOB [birth year], MRN [id number]  PCP: Caryl Bis, MD  Primary Cardiologist: Rozann Lesches, MD   Chief Complaint  Patient presents with  . Coronary Artery Disease    History of Present Illness: Russell Favila. is a 77 y.o. male last seen in January of this year.  He underwent a [employer], results outlined below.  He was placed on low-dose Imdur for treatment of angina with some evidence of ischemia in the RCA distribution.  He reports improvement in previously reported atypical left shoulder and arm symptoms.  We have decided to continue with medical therapy for now.  He continues to follow with Dr. Rayann Heman in the device clinic, Medtronic pacemaker in place.  Past Medical History:  Diagnosis Date  . Anemia   . CAD (coronary artery disease), native coronary artery    a. DES to proximal, mid, and distal RCA 2006. b. 12/2014 - [place] s/p  PTCA to the mid-distal previously placed RCA stents, otherwise nonobstructive CAD for continued medical therapy.  Marland Kitchen History of TIAs   . Mixed hyperlipidemia   . 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 Prostate cancer metastatic to bone ([place])   . Sinus node dysfunction [facility])    Medtronic pacemaker - Dr. Rayann Heman    Past Surgical History:  Procedure Laterality Date  . CARDIAC CATHETERIZATION N/A 01/06/2015   Procedure: Left Heart Cath and Coronary Angiography;  Surgeon: Troy Sine, MD;  Location: [place] [facility];  Service: Cardiovascular;  Laterality: N/A;  . PACEMAKER INSERTION     MDT    Current Outpatient Medications  Medication Sig Dispense Refill  . aspirin EC 81 MG EC tablet Take 1 tablet (81 mg total) by mouth daily. 30 tablet 11  . Calcium Carbonate-Vitamin D (CALCIUM-VITAMIN D) 500-200 MG-UNIT per tablet Take 1 tablet by mouth daily.    .  clonazePAM (KLONOPIN) 1 MG tablet Take 1 mg by mouth at bedtime.     . clopidogrel (PLAVIX) 75 MG tablet Take 1 tablet (75 mg total) by mouth daily. 90 tablet 1  . denosumab (XGEVA) 120 MG/1.7ML SOLN injection Inject 120 mg into the skin every 6 (six) weeks.    . fentaNYL (DURAGESIC - DOSED MCG/HR) 25 MCG/HR Place 1 patch onto the skin every 3 (three) days.    . isosorbide mononitrate (IMDUR) 30 MG 24 hr tablet Take 0.5 tablets (15 mg total) by mouth daily. 45 tablet 0  . leuprolide (LUPRON) 22.5 MG injection Inject 22.5 mg into the muscle every 3 (three) months.    . levothyroxine (SYNTHROID, LEVOTHROID) 50 MCG tablet Take 1 tablet by mouth daily.    . nitroGLYCERIN (NITROSTAT) 0.4 MG SL tablet Place 1 tablet (0.4 mg total) under the tongue every 5 (five) minutes as needed for chest pain. 25 tablet 3  . oxyCODONE-acetaminophen (PERCOCET) 10-325 MG per tablet Take 1 tablet by mouth daily.     . pantoprazole (PROTONIX) 40 MG tablet Take 40 mg by mouth daily.    . polyethylene glycol (MIRALAX / GLYCOLAX) packet Take 17 g by mouth daily.    Marland Kitchen senna-docusate (EQ SENNA-S) 8.6-50 MG per tablet Take 2 tablets by mouth daily. 60 tablet 3  . simvastatin (ZOCOR) 40 MG tablet Take 40 mg by mouth at bedtime.      Marland Kitchen  venlafaxine XR (EFFEXOR-XR) 150 MG 24 hr capsule Take 150 mg by mouth daily.    . vitamin B-12 (CYANOCOBALAMIN) 1000 MCG tablet Take 1,000 mcg by mouth daily.       No current facility-administered medications for this visit.    Allergies:  Penicillins; Phenazopyridine hcl; and Ramipril   Social History: The patient  reports that he quit smoking about 52 years ago. His smoking use included cigarettes. He started smoking about 65 years ago. He has a 5.00 pack-year smoking history. He quit smokeless tobacco use about 39 years ago. His smokeless tobacco use included chew. He reports that he does not drink alcohol or use drugs.   ROS:  Please see the history of present illness. Otherwise, complete  review of systems is positive for none.  All other systems are reviewed and negative.   Physical Exam: VS:  BP 120/76   Pulse 71   Ht 5\' 10"  (1.778 m)   Wt 134 lb 12.8 oz (61.1 kg)   SpO2 97%   BMI 19.34 kg/m , BMI Body mass index is 19.34 kg/m.  Wt Readings from Last 3 Encounters:  07/25/17 134 lb 12.8 oz (61.1 kg)  06/15/17 135 lb 12.8 oz (61.6 kg)  06/01/17 138 lb (62.6 kg)    General: Patient appears comfortable at rest. HEENT: Conjunctiva and lids normal, oropharynx clear. Neck: Supple, no elevated JVP or carotid bruits, no thyromegaly. Lungs: Clear to auscultation, nonlabored breathing at rest. Cardiac: Regular rate and rhythm, no S3 or significant systolic murmur, no pericardial rub. Abdomen: Soft, nontender, bowel sounds present. Extremities: No pitting edema, distal pulses 2+.  ECG: I personally reviewed the tracing from 06/15/2017 which showed sinus rhythm.  Recent Labwork: 05/25/2017: ALT 10; AST 16; BUN 13.8; Creatinine 1.1; HGB 12.0; Platelets 164; Potassium 4.5; Sodium 140     Component Value Date/Time   CHOL 171 01/04/2015 0431   TRIG 106 01/04/2015 0431   HDL 42 01/04/2015 0431   CHOLHDL 4.1 01/04/2015 0431   VLDL 21 01/04/2015 0431   LDLCALC 108 (H) 01/04/2015 0431    Other Studies Reviewed Today:  [facility] Myoview 06/21/2017:  No diagnostic ST segment changes to indicate ischemia.  Small, mild intensity, mid to apical inferoseptal defect is partially reversible. Suggestive of a mild ischemic territory.  This is a low risk study.  Nuclear stress EF: 56%.  Assessment and Plan:  1.  CAD with history of previous RCA intervention, most recently angioplasty of the mid to distal vessel in 2016 at prior stent site.  Recent Myoview did show mild RCA distribution ischemia and he is symptomatically stable following the addition of low-dose Imdur.  Continue with observation for now.  2.  Mixed hyperlipidemia, continue on Zocor.  Current medicines were  reviewed with the patient today.  Disposition: Follow-up in 6 months, sooner if needed.  Signed, Satira Sark, MD, [facility] 07/25/2017 4:20 PM    [facility] [place], [place], [place] [address] Phone: [phone]; Fax: [phone]

## 2017-07-25 ENCOUNTER — Encounter: Payer: Self-pay | Admitting: Cardiology

## 2017-07-25 ENCOUNTER — Ambulatory Visit (INDEPENDENT_AMBULATORY_CARE_PROVIDER_SITE_OTHER): Payer: Medicare Other | Admitting: Cardiology

## 2017-07-25 VITALS — BP 120/76 | HR 71 | Ht 70.0 in | Wt 134.8 lb

## 2017-07-25 DIAGNOSIS — E782 Mixed hyperlipidemia: Secondary | ICD-10-CM | POA: Diagnosis not present

## 2017-07-25 DIAGNOSIS — I25119 Atherosclerotic heart disease of native coronary artery with unspecified angina pectoris: Secondary | ICD-10-CM | POA: Diagnosis not present

## 2017-07-25 MED ORDER — NITROGLYCERIN 0.4 MG SL SUBL: 0.4000 mg | SUBLINGUAL_TABLET | SUBLINGUAL | 3 refills | Status: DC | PRN

## 2017-07-25 NOTE — Patient Instructions (Signed)

## 2017-08-24 ENCOUNTER — Inpatient Hospital Stay: Payer: Medicare Other | Attending: Oncology

## 2017-08-24 ENCOUNTER — Inpatient Hospital Stay: Payer: Medicare Other

## 2017-08-24 VITALS — BP 133/79 | HR 84 | Temp 98.0°F | Resp 18

## 2017-08-24 DIAGNOSIS — Z950 Presence of cardiac pacemaker: Secondary | ICD-10-CM

## 2017-08-24 DIAGNOSIS — I951 Orthostatic hypotension: Secondary | ICD-10-CM

## 2017-08-24 DIAGNOSIS — C61 Malignant neoplasm of prostate: Secondary | ICD-10-CM | POA: Diagnosis not present

## 2017-08-24 DIAGNOSIS — T82110D Breakdown (mechanical) of cardiac electrode, subsequent encounter: Secondary | ICD-10-CM

## 2017-08-24 DIAGNOSIS — Z5111 Encounter for antineoplastic chemotherapy: Secondary | ICD-10-CM | POA: Diagnosis not present

## 2017-08-24 DIAGNOSIS — I495 Sick sinus syndrome: Secondary | ICD-10-CM

## 2017-08-24 DIAGNOSIS — I259 Chronic ischemic heart disease, unspecified: Secondary | ICD-10-CM

## 2017-08-24 DIAGNOSIS — G459 Transient cerebral ischemic attack, unspecified: Secondary | ICD-10-CM

## 2017-08-24 DIAGNOSIS — C7951 Secondary malignant neoplasm of bone: Secondary | ICD-10-CM | POA: Diagnosis not present

## 2017-08-24 DIAGNOSIS — Z79899 Other long term (current) drug therapy: Secondary | ICD-10-CM | POA: Insufficient documentation

## 2017-08-24 LAB — CBC WITH DIFFERENTIAL/PLATELET
BASOS ABS: 0 10*3/uL (ref 0.0–0.1)
BASOS PCT: 1 %
Eosinophils Absolute: 0.2 10*3/uL (ref 0.0–0.5)
Eosinophils Relative: 5 %
HEMATOCRIT: 34.7 % — AB (ref 38.4–49.9)
HEMOGLOBIN: 11.6 g/dL — AB (ref 13.0–17.1)
LYMPHS PCT: 31 %
Lymphs Abs: 1.4 10*3/uL (ref 0.9–3.3)
MCH: 29.7 pg (ref 27.2–33.4)
MCHC: 33.3 g/dL (ref 32.0–36.0)
MCV: 89.2 fL (ref 79.3–98.0)
MONOS PCT: 10 %
Monocytes Absolute: 0.4 10*3/uL (ref 0.1–0.9)
NEUTROS ABS: 2.5 10*3/uL (ref 1.5–6.5)
NEUTROS PCT: 53 %
Platelets: 182 10*3/uL (ref 140–400)
RBC: 3.9 MIL/uL — ABNORMAL LOW (ref 4.20–5.82)
RDW: 14.2 % (ref 11.0–14.6)
WBC: 4.5 10*3/uL (ref 4.0–10.3)

## 2017-08-24 LAB — COMPREHENSIVE METABOLIC PANEL
ALK PHOS: 19 U/L — AB (ref 40–150)
ALT: 10 U/L (ref 0–55)
AST: 20 U/L (ref 5–34)
Albumin: 4.2 g/dL (ref 3.5–5.0)
Anion gap: 7 (ref 3–11)
BILIRUBIN TOTAL: 0.4 mg/dL (ref 0.2–1.2)
BUN: 14 mg/dL (ref 7–26)
CALCIUM: 9.9 mg/dL (ref 8.4–10.4)
CO2: 28 mmol/L (ref 22–29)
Chloride: 103 mmol/L (ref 98–109)
Creatinine, Ser: 1.2 mg/dL (ref 0.70–1.30)
GFR calc Af Amer: >60 mL/min (ref >60)
GFR, EST NON AFRICAN AMERICAN: 57 mL/min — AB (ref >60)
Glucose, Bld: 67 mg/dL — ABNORMAL LOW (ref 70–140)
POTASSIUM: 4 mmol/L (ref 3.5–5.1)
Sodium: 138 mmol/L (ref 136–145)
TOTAL PROTEIN: 7 g/dL (ref 6.4–8.3)

## 2017-08-24 MED ORDER — DENOSUMAB 120 MG/1.7ML ~~LOC~~ SOLN
120.0000 mg | Freq: Once | SUBCUTANEOUS | Status: AC
  Administered 2017-08-24: 120 mg via SUBCUTANEOUS

## 2017-08-24 MED ORDER — DENOSUMAB 120 MG/1.7ML ~~LOC~~ SOLN
SUBCUTANEOUS | Status: AC
  Filled 2017-08-24: qty 1.7

## 2017-08-24 MED ORDER — LEUPROLIDE ACETATE (3 MONTH) 22.5 MG IM KIT: 22.5000 mg | PACK | Freq: Once | INTRAMUSCULAR | Status: DC

## 2017-08-24 MED ORDER — LEUPROLIDE ACETATE (3 MONTH) 22.5 MG IM KIT
PACK | INTRAMUSCULAR | Status: AC
  Filled 2017-08-24: qty 22.5

## 2017-08-24 NOTE — Progress Notes (Signed)
Pt stated he has to separate injections by 1 week due to insurance coverage.  Pt received only Xgeva today.  Lupron will be due on 08/31/17.   Melissa, pharmacist notified.

## 2017-08-25 LAB — PROSTATE-SPECIFIC AG, SERUM (LABCORP): PROSTATE SPECIFIC AG, SERUM: 0.2 ng/mL (ref 0.0–4.0)

## 2017-08-25 LAB — TESTOSTERONE

## 2017-08-30 ENCOUNTER — Telehealth: Payer: Self-pay | Admitting: Cardiology

## 2017-08-30 ENCOUNTER — Ambulatory Visit (INDEPENDENT_AMBULATORY_CARE_PROVIDER_SITE_OTHER): Payer: Medicare Other | Admitting: *Deleted

## 2017-08-30 DIAGNOSIS — I495 Sick sinus syndrome: Secondary | ICD-10-CM | POA: Diagnosis not present

## 2017-08-30 NOTE — Progress Notes (Signed)
Remote pacemaker transmission.   

## 2017-08-30 NOTE — Telephone Encounter (Signed)
Spoke with pt and reminded pt of remote transmission that is due today. Pt verbalized understanding.   

## 2017-08-31 ENCOUNTER — Other Ambulatory Visit: Payer: Medicare Other

## 2017-08-31 ENCOUNTER — Inpatient Hospital Stay: Payer: Medicare Other

## 2017-08-31 ENCOUNTER — Telehealth: Payer: Self-pay | Admitting: Oncology

## 2017-08-31 ENCOUNTER — Encounter: Payer: Self-pay | Admitting: Cardiology

## 2017-08-31 ENCOUNTER — Inpatient Hospital Stay (HOSPITAL_BASED_OUTPATIENT_CLINIC_OR_DEPARTMENT_OTHER): Payer: Medicare Other | Admitting: Oncology

## 2017-08-31 VITALS — BP 136/72 | HR 93 | Temp 98.1°F | Resp 18 | Ht 70.0 in | Wt 138.8 lb

## 2017-08-31 DIAGNOSIS — E291 Testicular hypofunction: Secondary | ICD-10-CM | POA: Diagnosis not present

## 2017-08-31 DIAGNOSIS — C7951 Secondary malignant neoplasm of bone: Secondary | ICD-10-CM | POA: Diagnosis not present

## 2017-08-31 DIAGNOSIS — C61 Malignant neoplasm of prostate: Secondary | ICD-10-CM | POA: Diagnosis not present

## 2017-08-31 DIAGNOSIS — Z79899 Other long term (current) drug therapy: Secondary | ICD-10-CM | POA: Diagnosis not present

## 2017-08-31 DIAGNOSIS — Z5111 Encounter for antineoplastic chemotherapy: Secondary | ICD-10-CM | POA: Diagnosis not present

## 2017-08-31 DIAGNOSIS — N529 Male erectile dysfunction, unspecified: Secondary | ICD-10-CM | POA: Diagnosis not present

## 2017-08-31 MED ORDER — LEUPROLIDE ACETATE (3 MONTH) 22.5 MG IM KIT
PACK | INTRAMUSCULAR | Status: AC
  Filled 2017-08-31: qty 22.5

## 2017-08-31 MED ORDER — LEUPROLIDE ACETATE (3 MONTH) 22.5 MG IM KIT
22.5000 mg | PACK | Freq: Once | INTRAMUSCULAR | Status: AC
  Administered 2017-08-31: 22.5 mg via INTRAMUSCULAR

## 2017-08-31 NOTE — Patient Instructions (Signed)
Leuprolide depot injection What is this medicine? LEUPROLIDE (loo PROE lide) is a man-made protein that acts like a natural hormone in the body. It decreases testosterone in men and decreases estrogen in women. In men, this medicine is used to treat advanced prostate cancer. In women, some forms of this medicine may be used to treat endometriosis, uterine fibroids, or other male hormone-related problems. This medicine may be used for other purposes; ask your health care provider or pharmacist if you have questions. COMMON BRAND NAME(S): Eligard, Lupron Depot, Lupron Depot-Ped, Viadur What should I tell my health care provider before I take this medicine? They need to know if you have any of these conditions: -diabetes -heart disease or previous heart attack -high blood pressure -high cholesterol -mental illness -osteoporosis -pain or difficulty passing urine -seizures -spinal cord metastasis -stroke -suicidal thoughts, plans, or attempt; a previous suicide attempt by you or a family member -tobacco smoker -unusual vaginal bleeding (women) -an unusual or allergic reaction to leuprolide, benzyl alcohol, other medicines, foods, dyes, or preservatives -pregnant or trying to get pregnant -breast-feeding How should I use this medicine? This medicine is for injection into a muscle or for injection under the skin. It is given by a health care professional in a hospital or clinic setting. The specific product will determine how it will be given to you. Make sure you understand which product you receive and how often you will receive it. Talk to your pediatrician regarding the use of this medicine in children. Special care may be needed. Overdosage: If you think you have taken too much of this medicine contact a poison control center or emergency room at once. NOTE: This medicine is only for you. Do not share this medicine with others. What if I miss a dose? It is important not to miss a dose.  Call your doctor or health care professional if you are unable to keep an appointment. Depot injections: Depot injections are given either once-monthly, every 12 weeks, every 16 weeks, or every 24 weeks depending on the product you are prescribed. The product you are prescribed will be based on if you are male or male, and your condition. Make sure you understand your product and dosing. What may interact with this medicine? Do not take this medicine with any of the following medications: -chasteberry This medicine may also interact with the following medications: -herbal or dietary supplements, like black cohosh or DHEA -male hormones, like estrogens or progestins and birth control pills, patches, rings, or injections -male hormones, like testosterone This list may not describe all possible interactions. Give your health care provider a list of all the medicines, herbs, non-prescription drugs, or dietary supplements you use. Also tell them if you smoke, drink alcohol, or use illegal drugs. Some items may interact with your medicine. What should I watch for while using this medicine? Visit your doctor or health care professional for regular checks on your progress. During the first weeks of treatment, your symptoms may get worse, but then will improve as you continue your treatment. You may get hot flashes, increased bone pain, increased difficulty passing urine, or an aggravation of nerve symptoms. Discuss these effects with your doctor or health care professional, some of them may improve with continued use of this medicine. Male patients may experience a menstrual cycle or spotting during the first months of therapy with this medicine. If this continues, contact your doctor or health care professional. What side effects may I notice from receiving this medicine? Side   effects that you should report to your doctor or health care professional as soon as possible: -allergic reactions like skin  rash, itching or hives, swelling of the face, lips, or tongue -breathing problems -chest pain -depression or memory disorders -pain in your legs or groin -pain at site where injected or implanted -seizures -severe headache -swelling of the feet and legs -suicidal thoughts or other mood changes -visual changes -vomiting Side effects that usually do not require medical attention (report to your doctor or health care professional if they continue or are bothersome): -breast swelling or tenderness -decrease in sex drive or performance -diarrhea -hot flashes -loss of appetite -muscle, joint, or bone pains -nausea -redness or irritation at site where injected or implanted -skin problems or acne This list may not describe all possible side effects. Call your doctor for medical advice about side effects. You may report side effects to FDA at 1-800-FDA-1088. Where should I keep my medicine? This drug is given in a hospital or clinic and will not be stored at home. NOTE: This sheet is a summary. It may not cover all possible information. If you have questions about this medicine, talk to your doctor, pharmacist, or health care provider.  2018 Elsevier/Gold Standard (2015-11-13 09:45:53)  

## 2017-08-31 NOTE — Progress Notes (Signed)
Hematology and Oncology Follow Up Visit  Russell Browning [id number] 05-Mar-1941 77 y.o. 08/31/2017 10:14 AM    Principle Diagnosis:  77 year old man with prostate cancer diagnosed initially in the year 2000 but subsequently developed advanced disease in 2013.  He has metastatic disease to the bone.  His disease continues to be hormone sensitive.   Prior Therapy: 1. He received radiation therapy and 2 years of Lupron for locally advanced prostate cancer at the time of diagnosis in 2000.  2. PSA was under control from 2001 up until the early parts of 2012, where his PSA rose up to 16.9. At that time, he was under the care of Russell Browning, a local  urologist and he was staged with a bone scan, which showed he had bony metastases to the right hip and right femur. He subsequently started on  Lupron every 3 months.  3. He is S/P radiation therapy directed to the right hip (30 gray in 12 fractions) concluded in 07/2011 for palliative purposes.    Current therapy:  1. Lupron 22.5 mg every three months.   2. Delton See given every 3 months started in 10/2011.   Interim History:  Russell Browning is here for a follow-up.  He reports no major complaints at this time.  His bone pain has significantly improved and is no longer taking Percocet.  He is currently on fentanyl patch and is adequate in controlling his pain.  He denied any falls or syncope.  He denies any pathological fractures.  He does report few complications related to Lupron including hot flashes and erectile dysfunction.  His symptoms are manageable at this time.  He is enjoying excellent quality of life and eating well.  He has gained 3 pounds since last visit.  He does not report any headaches, blurry vision, or seizures. He does not report any fevers or chills or sweats.  He does not report any chest pain, palpitation, orthopnea or leg edema.  He does not report any cough, wheezing or hemoptysis. Has not reported any nausea or vomiting. Has not  reported any palpitation or leg edema. He is not reporting any change in his bowel habits. Has not reported any frequency urgency or hesitancy.  He does not report any bone pain or pathological fractures.  He does not report any lymphadenopathy or petechiae. Remainder of his review of systems is negative.   Medications: I have reviewed the patient's current medications.   Current Outpatient Medications  Medication Sig Dispense Refill  . aspirin EC 81 MG EC tablet Take 1 tablet (81 mg total) by mouth daily. 30 tablet 11  . Calcium Carbonate-Vitamin D (CALCIUM-VITAMIN D) 500-200 MG-UNIT per tablet Take 1 tablet by mouth daily.    . clonazePAM (KLONOPIN) 1 MG tablet Take 1 mg by mouth at bedtime.     . clopidogrel (PLAVIX) 75 MG tablet Take 1 tablet (75 mg total) by mouth daily. 90 tablet 1  . denosumab (XGEVA) 120 MG/1.7ML SOLN injection Inject 120 mg into the skin every 6 (six) weeks.    . fentaNYL (DURAGESIC - DOSED MCG/HR) 25 MCG/HR Place 1 patch onto the skin every 3 (three) days.    . isosorbide mononitrate (IMDUR) 30 MG 24 hr tablet Take 0.5 tablets (15 mg total) by mouth daily. 45 tablet 0  . leuprolide (LUPRON) 22.5 MG injection Inject 22.5 mg into the muscle every 3 (three) months.    . levothyroxine (SYNTHROID, LEVOTHROID) 50 MCG tablet Take 1 tablet by mouth daily.    Marland Kitchen  nitroGLYCERIN (NITROSTAT) 0.4 MG SL tablet Place 1 tablet (0.4 mg total) under the tongue every 5 (five) minutes as needed for chest pain. 25 tablet 3  . oxyCODONE-acetaminophen (PERCOCET) 10-325 MG per tablet Take 1 tablet by mouth daily.     . pantoprazole (PROTONIX) 40 MG tablet Take 40 mg by mouth daily.    . polyethylene glycol (MIRALAX / GLYCOLAX) packet Take 17 g by mouth daily.    Marland Kitchen senna-docusate (EQ SENNA-S) 8.6-50 MG per tablet Take 2 tablets by mouth daily. 60 tablet 3  . simvastatin (ZOCOR) 40 MG tablet Take 40 mg by mouth at bedtime.      Marland Kitchen venlafaxine XR (EFFEXOR-XR) 150 MG 24 hr capsule Take 150 mg by  mouth daily.    . vitamin B-12 (CYANOCOBALAMIN) 1000 MCG tablet Take 1,000 mcg by mouth daily.       No current facility-administered medications for this visit.      Allergies:  Allergies  Allergen Reactions  . Penicillins Other (See Comments)    Passed out  . Phenazopyridine Hcl Hives  . Ramipril Hives    Past Medical History, Surgical history, Social history, and Family History remain unchanged.   Physical Exam: His vitals reviewed today blood pressure 136/72.  Pulse is 93, afebrile and weighs 138.8 pounds. ECOG: 1 General appearance: Well-appearing gentleman appeared comfortable. Head: Normocephalic, without obvious abnormality  Oropharynx: No oral ulcers or lesions. Eyes: No scleral icterus.   Lymph nodes: Cervical, supraclavicular, and axillary nodes normal. Heart:regular rate and rhythm, S1, S2 normal, no murmur, click, rub or gallop Lung: Clear to auscultation without any rhonchi, wheezes or dullness to percussion. Abdomin: Soft, nontender without any rebound or guarding. Musculoskeletal: No joint deformity or effusion.  Skin: No rashes or lesions.   Lab Results: Lab Results  Component Value Date   WBC 4.5 08/24/2017   HGB 11.6 (L) 08/24/2017   HCT 34.7 (L) 08/24/2017   MCV 89.2 08/24/2017   PLT 182 08/24/2017      Results for Russell Browning, Russell SR. (MRN [id number]) as of 08/31/2017 09:49  Ref. Range 05/25/2017 08:00 08/24/2017 10:08  Prostate Specific Ag, Serum Latest Ref Range: 0.0 - 4.0 ng/mL 0.2 0.2         Impression and Plan:   77 year old man with:   1.  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  He was diagnosed in year 2000 and received definitive treatment at the time.  He developed bony metastasis in 2012.  He continues to be on androgen deprivation therapy with excellent PSA response.  His PSA is currently detectable at 0.2 which is a slow rise over the last year or so.  At this time, I have recommended continuing  androgen deprivation only and consider adding second line hormone therapy if his PSA starts to rise more rapidly.  I will obtain a bone scan for staging purposes before the next visit.   2. Bone pain: No recent exacerbation in his bone pain.  He continues to be on fentanyl patch prescribed by his primary care physician.  His pain is predominantly related to arthritis.  3. Bone directed therapy: He is at risk of developing skeletal related events.  Complications associated with Delton See long-term were reviewed including osteonecrosis of the jaw as well as hypokalemia.  He is agreeable to continue and will receive it every 3 months.  4. Androgen depravation: Long-term complication associated with this medication was discussed today.  He is agreeable to continue to  receive it every 3 months.  5. Follow up: He will return on November 24, 2017 for a laboratory check, Xgeva and bone scan.  Hewill have an MD follow-up and Lupron on June 20.  15  minutes was spent with the patient face-to-face today.  More than 50% of time was dedicated to patient counseling, education and coordination of his care.   Zola Button, MD 3/20/201910:14 AM

## 2017-08-31 NOTE — Telephone Encounter (Signed)
Appointments scheduled AVS/Calendar printed per 3/20 los 

## 2017-09-01 DIAGNOSIS — Z961 Presence of intraocular lens: Secondary | ICD-10-CM | POA: Diagnosis not present

## 2017-09-01 DIAGNOSIS — H04123 Dry eye syndrome of bilateral lacrimal glands: Secondary | ICD-10-CM | POA: Diagnosis not present

## 2017-09-01 DIAGNOSIS — H43813 Vitreous degeneration, bilateral: Secondary | ICD-10-CM | POA: Diagnosis not present

## 2017-09-02 ENCOUNTER — Other Ambulatory Visit: Payer: Self-pay | Admitting: Cardiology

## 2017-09-02 DIAGNOSIS — Z79891 Long term (current) use of opiate analgesic: Secondary | ICD-10-CM | POA: Diagnosis not present

## 2017-09-02 DIAGNOSIS — E782 Mixed hyperlipidemia: Secondary | ICD-10-CM | POA: Diagnosis not present

## 2017-09-02 DIAGNOSIS — Z9189 Other specified personal risk factors, not elsewhere classified: Secondary | ICD-10-CM | POA: Diagnosis not present

## 2017-09-02 DIAGNOSIS — I10 Essential (primary) hypertension: Secondary | ICD-10-CM | POA: Diagnosis not present

## 2017-09-02 DIAGNOSIS — Z681 Body mass index (BMI) 19 or less, adult: Secondary | ICD-10-CM | POA: Diagnosis not present

## 2017-09-02 DIAGNOSIS — K210 Gastro-esophageal reflux disease with esophagitis: Secondary | ICD-10-CM | POA: Diagnosis not present

## 2017-09-02 DIAGNOSIS — L299 Pruritus, unspecified: Secondary | ICD-10-CM | POA: Diagnosis not present

## 2017-09-02 DIAGNOSIS — C61 Malignant neoplasm of prostate: Secondary | ICD-10-CM | POA: Diagnosis not present

## 2017-09-02 DIAGNOSIS — F331 Major depressive disorder, recurrent, moderate: Secondary | ICD-10-CM | POA: Diagnosis not present

## 2017-09-02 DIAGNOSIS — L209 Atopic dermatitis, unspecified: Secondary | ICD-10-CM | POA: Diagnosis not present

## 2017-09-07 ENCOUNTER — Other Ambulatory Visit: Payer: Self-pay | Admitting: Cardiology

## 2017-09-07 DIAGNOSIS — Z79891 Long term (current) use of opiate analgesic: Secondary | ICD-10-CM | POA: Diagnosis not present

## 2017-09-07 DIAGNOSIS — I10 Essential (primary) hypertension: Secondary | ICD-10-CM | POA: Diagnosis not present

## 2017-09-07 DIAGNOSIS — D630 Anemia in neoplastic disease: Secondary | ICD-10-CM | POA: Diagnosis not present

## 2017-09-07 DIAGNOSIS — E782 Mixed hyperlipidemia: Secondary | ICD-10-CM | POA: Diagnosis not present

## 2017-09-07 DIAGNOSIS — I2510 Atherosclerotic heart disease of native coronary artery without angina pectoris: Secondary | ICD-10-CM | POA: Diagnosis not present

## 2017-09-07 DIAGNOSIS — Z1389 Encounter for screening for other disorder: Secondary | ICD-10-CM | POA: Diagnosis not present

## 2017-09-07 DIAGNOSIS — C61 Malignant neoplasm of prostate: Secondary | ICD-10-CM | POA: Diagnosis not present

## 2017-09-07 DIAGNOSIS — Z6820 Body mass index (BMI) 20.0-20.9, adult: Secondary | ICD-10-CM | POA: Diagnosis not present

## 2017-09-16 LAB — CUP PACEART REMOTE DEVICE CHECK
Battery Impedance: 2204 Ohm
Battery Remaining Longevity: 28 mo
Battery Voltage: 2.72 V
Brady Statistic AP VP Percent: 0 %
Implantable Lead Implant Date: 19990408
Implantable Lead Implant Date: 19990408
Implantable Lead Location: 753859
Implantable Pulse Generator Implant Date: 20081126
Lead Channel Pacing Threshold Pulse Width: 0.4 ms
Lead Channel Setting Pacing Amplitude: 2 V
Lead Channel Setting Pacing Amplitude: 2.5 V
Lead Channel Setting Sensing Sensitivity: 5.6 mV
MDC IDC LEAD LOCATION: 753860
MDC IDC LEAD SERIAL: 208202
MDC IDC MSMT LEADCHNL RA IMPEDANCE VALUE: 135 Ohm
MDC IDC MSMT LEADCHNL RV IMPEDANCE VALUE: 1220 Ohm
MDC IDC MSMT LEADCHNL RV PACING THRESHOLD AMPLITUDE: 1.875 V
MDC IDC SESS DTM: 20190319164432
MDC IDC SET LEADCHNL RV PACING PULSEWIDTH: 0.76 ms
MDC IDC STAT BRADY AP VS PERCENT: 62 %
MDC IDC STAT BRADY AS VP PERCENT: 0 %
MDC IDC STAT BRADY AS VS PERCENT: 37 %

## 2017-10-19 ENCOUNTER — Telehealth: Payer: Self-pay | Admitting: Cardiology

## 2017-10-19 MED ORDER — ISOSORBIDE MONONITRATE ER 30 MG PO TB24: 15.0000 mg | ORAL_TABLET | Freq: Every day | ORAL | 3 refills | Status: DC

## 2017-10-19 NOTE — Telephone Encounter (Signed)
°*  STAT* If patient is at the pharmacy, call can be transferred to refill team.   1. Which medications need to be refilled?   isosorbide mononitrate (IMDUR) 30 MG 24 hr tablet    2. Which pharmacy/location (including street and city if local pharmacy) is medication to be sent to? [place]  3. Do they need a 30 day or 90 day supply?   Per Dr Quillian Quince instructions patient started taking a whole pill

## 2017-10-19 NOTE — Telephone Encounter (Signed)
RX SENT

## 2017-10-24 ENCOUNTER — Telehealth: Payer: Self-pay | Admitting: Cardiology

## 2017-10-24 MED ORDER — ISOSORBIDE MONONITRATE ER 30 MG PO TB24: 30.0000 mg | ORAL_TABLET | Freq: Every day | ORAL | 3 refills | Status: DC

## 2017-10-24 NOTE — Telephone Encounter (Signed)
Stated that pmd felt like he should increase to whole pill if things are looking good & he was doing fine at his last check with him.  Stated that he discussed also with you at last OV about possibly increasing.  Are you okay to refill this for patient for whole pill (30mg ) instead of the 1/2 pill (15mg ) or do you prefer primary do since he suggested change to patient?

## 2017-10-24 NOTE — Telephone Encounter (Signed)
Yes, we can increase Imdur to 30 mg daily.

## 2017-10-24 NOTE — Telephone Encounter (Signed)
Dr. Olena Heckle told patient that he needs to take isosorbide mononitrate (IMDUR) 30 MG 24 hr tablet   Instead of taking 1/2 pill.  Patient needs refill .  [facility] [place], [place]  Please call patient [phone] to confirm

## 2017-10-24 NOTE — Telephone Encounter (Signed)
Patient notified.  Medication sent to [facility] at patient's request.

## 2017-11-02 DIAGNOSIS — W57XXXA Bitten or stung by nonvenomous insect and other nonvenomous arthropods, initial encounter: Secondary | ICD-10-CM | POA: Diagnosis not present

## 2017-11-02 DIAGNOSIS — L0390 Cellulitis, unspecified: Secondary | ICD-10-CM | POA: Diagnosis not present

## 2017-11-02 DIAGNOSIS — Z6820 Body mass index (BMI) 20.0-20.9, adult: Secondary | ICD-10-CM | POA: Diagnosis not present

## 2017-11-24 ENCOUNTER — Inpatient Hospital Stay: Payer: Medicare Other | Attending: Oncology

## 2017-11-24 ENCOUNTER — Encounter (HOSPITAL_COMMUNITY)
Admission: RE | Admit: 2017-11-24 | Discharge: 2017-11-24 | Disposition: A | Discharge status: Home / Self Care | Payer: Medicare Other | Source: Ambulatory Visit | Attending: Oncology | Admitting: Oncology

## 2017-11-24 ENCOUNTER — Inpatient Hospital Stay: Payer: Medicare Other

## 2017-11-24 VITALS — BP 105/82 | HR 89 | Temp 97.7°F | Resp 20

## 2017-11-24 DIAGNOSIS — I259 Chronic ischemic heart disease, unspecified: Secondary | ICD-10-CM

## 2017-11-24 DIAGNOSIS — T82110D Breakdown (mechanical) of cardiac electrode, subsequent encounter: Secondary | ICD-10-CM

## 2017-11-24 DIAGNOSIS — I495 Sick sinus syndrome: Secondary | ICD-10-CM

## 2017-11-24 DIAGNOSIS — M1990 Unspecified osteoarthritis, unspecified site: Secondary | ICD-10-CM | POA: Insufficient documentation

## 2017-11-24 DIAGNOSIS — I951 Orthostatic hypotension: Secondary | ICD-10-CM

## 2017-11-24 DIAGNOSIS — C61 Malignant neoplasm of prostate: Secondary | ICD-10-CM

## 2017-11-24 DIAGNOSIS — R937 Abnormal findings on diagnostic imaging of other parts of musculoskeletal system: Secondary | ICD-10-CM | POA: Diagnosis not present

## 2017-11-24 DIAGNOSIS — E291 Testicular hypofunction: Secondary | ICD-10-CM | POA: Insufficient documentation

## 2017-11-24 DIAGNOSIS — Z950 Presence of cardiac pacemaker: Secondary | ICD-10-CM

## 2017-11-24 DIAGNOSIS — C7951 Secondary malignant neoplasm of bone: Secondary | ICD-10-CM | POA: Insufficient documentation

## 2017-11-24 DIAGNOSIS — Z5111 Encounter for antineoplastic chemotherapy: Secondary | ICD-10-CM | POA: Diagnosis not present

## 2017-11-24 DIAGNOSIS — G459 Transient cerebral ischemic attack, unspecified: Secondary | ICD-10-CM

## 2017-11-24 LAB — CBC WITH DIFFERENTIAL (CANCER CENTER ONLY)
BASOS PCT: 1 %
Basophils Absolute: 0 10*3/uL (ref 0.0–0.1)
EOS ABS: 0.2 10*3/uL (ref 0.0–0.5)
EOS PCT: 3 %
HCT: 33.3 % — ABNORMAL LOW (ref 38.4–49.9)
HEMOGLOBIN: 10.7 g/dL — AB (ref 13.0–17.1)
LYMPHS ABS: 1.4 10*3/uL (ref 0.9–3.3)
Lymphocytes Relative: 31 %
MCH: 29.2 pg (ref 27.2–33.4)
MCHC: 32.1 g/dL (ref 32.0–36.0)
MCV: 91 fL (ref 79.3–98.0)
MONO ABS: 0.4 10*3/uL (ref 0.1–0.9)
MONOS PCT: 10 %
NEUTROS PCT: 55 %
Neutro Abs: 2.5 10*3/uL (ref 1.5–6.5)
Platelet Count: 148 10*3/uL (ref 140–400)
RBC: 3.66 MIL/uL — ABNORMAL LOW (ref 4.20–5.82)
RDW: 13.8 % (ref 11.0–14.6)
WBC Count: 4.5 10*3/uL (ref 4.0–10.3)

## 2017-11-24 LAB — CMP (CANCER CENTER ONLY)
ALBUMIN: 4.2 g/dL (ref 3.5–5.0)
ALK PHOS: 19 U/L — AB (ref 40–150)
ALT: 8 U/L (ref 0–55)
AST: 25 U/L (ref 5–34)
Anion gap: 9 (ref 3–11)
BUN: 15 mg/dL (ref 7–26)
CALCIUM: 9.6 mg/dL (ref 8.4–10.4)
CHLORIDE: 103 mmol/L (ref 98–109)
CO2: 29 mmol/L (ref 22–29)
CREATININE: 1.15 mg/dL (ref 0.70–1.30)
GFR, Est AFR Am: >60 mL/min (ref >60)
GFR, Estimated: 60 mL/min — ABNORMAL LOW (ref >60)
GLUCOSE: 93 mg/dL (ref 70–140)
Potassium: 4.5 mmol/L (ref 3.5–5.1)
Sodium: 141 mmol/L (ref 136–145)
Total Bilirubin: 0.4 mg/dL (ref 0.2–1.2)
Total Protein: 6.5 g/dL (ref 6.4–8.3)

## 2017-11-24 MED ORDER — TECHNETIUM TC 99M MEDRONATE IV KIT
20.0000 | PACK | Freq: Once | INTRAVENOUS | Status: AC | PRN
  Administered 2017-11-24: 18.2 via INTRAVENOUS

## 2017-11-24 MED ORDER — DENOSUMAB 120 MG/1.7ML ~~LOC~~ SOLN
120.0000 mg | Freq: Once | SUBCUTANEOUS | Status: AC
  Administered 2017-11-24: 120 mg via SUBCUTANEOUS

## 2017-11-24 NOTE — Patient Instructions (Signed)
Denosumab injection  What is this medicine?  DENOSUMAB (den oh sue mab) slows bone breakdown. Prolia is used to treat osteoporosis in women after menopause and in men. Xgeva is used to prevent bone fractures and other bone problems caused by cancer bone metastases. Xgeva is also used to treat giant cell tumor of the bone.  This medicine may be used for other purposes; ask your health care provider or pharmacist if you have questions.  What should I tell my health care provider before I take this medicine?  They need to know if you have any of these conditions:  -dental disease  -eczema  -infection or history of infections  -kidney disease or on dialysis  -low blood calcium or vitamin D  -malabsorption syndrome  -scheduled to have surgery or tooth extraction  -taking medicine that contains denosumab  -thyroid or parathyroid disease  -an unusual reaction to denosumab, other medicines, foods, dyes, or preservatives  -pregnant or trying to get pregnant  -breast-feeding  How should I use this medicine?  This medicine is for injection under the skin. It is given by a health care professional in a hospital or clinic setting.  If you are getting Prolia, a special MedGuide will be given to you by the pharmacist with each prescription and refill. Be sure to read this information carefully each time.  For Prolia, talk to your pediatrician regarding the use of this medicine in children. Special care may be needed. For Xgeva, talk to your pediatrician regarding the use of this medicine in children. While this drug may be prescribed for children as young as 13 years for selected conditions, precautions do apply.  Overdosage: If you think you have taken too much of this medicine contact a poison control center or emergency room at once.  NOTE: This medicine is only for you. Do not share this medicine with others.  What if I miss a dose?  It is important not to miss your dose. Call your doctor or health care professional if you are  unable to keep an appointment.  What may interact with this medicine?  Do not take this medicine with any of the following medications:  -other medicines containing denosumab  This medicine may also interact with the following medications:  -medicines that suppress the immune system  -medicines that treat cancer  -steroid medicines like prednisone or cortisone  This list may not describe all possible interactions. Give your health care provider a list of all the medicines, herbs, non-prescription drugs, or dietary supplements you use. Also tell them if you smoke, drink alcohol, or use illegal drugs. Some items may interact with your medicine.  What should I watch for while using this medicine?  Visit your doctor or health care professional for regular checks on your progress. Your doctor or health care professional may order blood tests and other tests to see how you are doing.  Call your doctor or health care professional if you get a cold or other infection while receiving this medicine. Do not treat yourself. This medicine may decrease your body's ability to fight infection.  You should make sure you get enough calcium and vitamin D while you are taking this medicine, unless your doctor tells you not to. Discuss the foods you eat and the vitamins you take with your health care professional.  See your dentist regularly. Brush and floss your teeth as directed. Before you have any dental work done, tell your dentist you are receiving this medicine.  Do   not become pregnant while taking this medicine or for 5 months after stopping it. Women should inform their doctor if they wish to become pregnant or think they might be pregnant. There is a potential for serious side effects to an unborn child. Talk to your health care professional or pharmacist for more information.  What side effects may I notice from receiving this medicine?  Side effects that you should report to your doctor or health care professional as soon as  possible:  -allergic reactions like skin rash, itching or hives, swelling of the face, lips, or tongue  -breathing problems  -chest pain  -fast, irregular heartbeat  -feeling faint or lightheaded, falls  -fever, chills, or any other sign of infection  -muscle spasms, tightening, or twitches  -numbness or tingling  -skin blisters or bumps, or is dry, peels, or red  -slow healing or unexplained pain in the mouth or jaw  -unusual bleeding or bruising  Side effects that usually do not require medical attention (Report these to your doctor or health care professional if they continue or are bothersome.):  -muscle pain  -stomach upset, gas  This list may not describe all possible side effects. Call your doctor for medical advice about side effects. You may report side effects to FDA at 1-800-FDA-1088.  Where should I keep my medicine?  This medicine is only given in a clinic, doctor's office, or other health care setting and will not be stored at home.  NOTE: This sheet is a summary. It may not cover all possible information. If you have questions about this medicine, talk to your doctor, pharmacist, or health care provider.      2016, [employer]vier/[employer] Standard. (2011-11-29 12:37:47)

## 2017-11-25 LAB — PROSTATE-SPECIFIC AG, SERUM (LABCORP): PROSTATE SPECIFIC AG, SERUM: 0.2 ng/mL (ref 0.0–4.0)

## 2017-11-29 ENCOUNTER — Ambulatory Visit (INDEPENDENT_AMBULATORY_CARE_PROVIDER_SITE_OTHER): Payer: Medicare Other | Admitting: *Deleted

## 2017-11-29 ENCOUNTER — Telehealth: Payer: Self-pay | Admitting: Cardiology

## 2017-11-29 DIAGNOSIS — I495 Sick sinus syndrome: Secondary | ICD-10-CM | POA: Diagnosis not present

## 2017-11-29 NOTE — Telephone Encounter (Signed)
Spoke with pt and reminded pt of remote transmission that is due today. Pt verbalized understanding.   

## 2017-11-29 NOTE — Progress Notes (Signed)
Remote pacemaker transmission.   

## 2017-12-01 ENCOUNTER — Inpatient Hospital Stay (HOSPITAL_BASED_OUTPATIENT_CLINIC_OR_DEPARTMENT_OTHER): Payer: Medicare Other | Admitting: Oncology

## 2017-12-01 ENCOUNTER — Inpatient Hospital Stay: Payer: Medicare Other

## 2017-12-01 ENCOUNTER — Telehealth: Payer: Self-pay | Admitting: Oncology

## 2017-12-01 VITALS — BP 113/61 | HR 80 | Temp 98.5°F | Resp 18 | Ht 70.0 in | Wt 133.5 lb

## 2017-12-01 DIAGNOSIS — M1990 Unspecified osteoarthritis, unspecified site: Secondary | ICD-10-CM

## 2017-12-01 DIAGNOSIS — C61 Malignant neoplasm of prostate: Secondary | ICD-10-CM | POA: Diagnosis not present

## 2017-12-01 DIAGNOSIS — C7951 Secondary malignant neoplasm of bone: Secondary | ICD-10-CM | POA: Diagnosis not present

## 2017-12-01 DIAGNOSIS — E291 Testicular hypofunction: Secondary | ICD-10-CM | POA: Diagnosis not present

## 2017-12-01 DIAGNOSIS — Z5111 Encounter for antineoplastic chemotherapy: Secondary | ICD-10-CM | POA: Diagnosis not present

## 2017-12-01 DIAGNOSIS — M898X9 Other specified disorders of bone, unspecified site: Secondary | ICD-10-CM | POA: Diagnosis not present

## 2017-12-01 MED ORDER — LEUPROLIDE ACETATE (3 MONTH) 22.5 MG IM KIT
22.5000 mg | PACK | Freq: Once | INTRAMUSCULAR | Status: AC
  Administered 2017-12-01: 22.5 mg via INTRAMUSCULAR

## 2017-12-01 NOTE — Patient Instructions (Signed)

## 2017-12-01 NOTE — Progress Notes (Signed)
Hematology and Oncology Follow Up Visit  Russell Browning [id number] 1941-03-23 77 y.o. 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  His initial diagnosis with organ confined prostate cancer in the year 2000.  Prior Therapy: 1. He received radiation therapy and 2 years of Lupron for locally advanced prostate cancer at the time of diagnosis in 2000.  2.  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  He was started on Lupron at that time.  3. He is S/P radiation therapy directed to the right hip (30 gray in 12 fractions) concluded in 07/2011 for palliative purposes.    Current therapy:  1. Lupron 22.5 mg every three months.   2. Delton See given every 3 months started in 10/2011.  Therapy discontinued in June 2019 based on patient's preference.  Interim History:  Russell Browning presents today for a follow-up.  Since last visit, he received his Xgeva injection a week from today and reported few complications.  He felt dizzy as well as unsteady and diffuse arthralgia was noted.  The symptoms have improved in the last 48 hours.  He denies any bone pain or pathological fractures.  He denied any weight loss or appetite changes.  He denies any complications related to Lupron.  He remains active and continues to attend activities of daily living.  He denies any urination difficulties.   He does not report any headaches, blurry vision, or seizures.  He denies any alteration in mental status.  He does not report any fevers or chills or sweats.  He does not report any chest pain, palpitation, orthopnea or leg edema.  He does not report any cough, wheezing or hemoptysis. Has not reported any nausea or vomiting.  He denies any constipation or diarrhea. Has not reported any frequency urgency or hesitancy.  He denies any skin rashes or lesions.  He does not  report any lymphadenopathy or petechiae. Remainder of his review of systems is negative.   Medications: I have reviewed the patient's current medications.   Current Outpatient Medications  Medication Sig Dispense Refill  . aspirin EC 81 MG EC tablet Take 1 tablet (81 mg total) by mouth daily. 30 tablet 11  . Calcium Carbonate-Vitamin D (CALCIUM-VITAMIN D) 500-200 MG-UNIT per tablet Take 1 tablet by mouth daily.    . clonazePAM (KLONOPIN) 1 MG tablet Take 1 mg by mouth at bedtime.     . clopidogrel (PLAVIX) 75 MG tablet TAKE ONE TABLET BY MOUTH DAILY 90 tablet 3  . denosumab (XGEVA) 120 MG/1.7ML SOLN injection Inject 120 mg into the skin every 6 (six) weeks.    . fentaNYL (DURAGESIC - DOSED MCG/HR) 25 MCG/HR Place 1 patch onto the skin every 3 (three) days.    . isosorbide mononitrate (IMDUR) 30 MG 24 hr tablet Take 1 tablet (30 mg total) by mouth daily. 90 tablet 3  . leuprolide (LUPRON) 22.5 MG injection Inject 22.5 mg into the muscle every 3 (three) months.    . levothyroxine (SYNTHROID, LEVOTHROID) 50 MCG tablet Take 1 tablet by mouth daily.    . nitroGLYCERIN (NITROSTAT) 0.4 MG SL tablet Place 1 tablet (0.4 mg total) under the tongue every 5 (five) minutes as needed for chest pain. 25 tablet 3  . oxyCODONE-acetaminophen (PERCOCET) 10-325 MG per tablet Take 1 tablet by mouth daily.     . pantoprazole (  PROTONIX) 40 MG tablet Take 40 mg by mouth daily.    . polyethylene glycol (MIRALAX / GLYCOLAX) packet Take 17 g by mouth daily.    Marland Kitchen senna-docusate (EQ SENNA-S) 8.6-50 MG per tablet Take 2 tablets by mouth daily. 60 tablet 3  . simvastatin (ZOCOR) 40 MG tablet Take 40 mg by mouth at bedtime.      Marland Kitchen venlafaxine XR (EFFEXOR-XR) 150 MG 24 hr capsule Take 150 mg by mouth daily.    . vitamin B-12 (CYANOCOBALAMIN) 1000 MCG tablet Take 1,000 mcg by mouth daily.       No current facility-administered medications for this visit.      Allergies:  Allergies  Allergen Reactions  . Penicillins  Other (See Comments)    Passed out  . Phenazopyridine Hcl Hives  . Ramipril Hives    Past Medical History, Surgical history, Social history, and Family History remain unchanged.   Physical Exam:  Blood pressure 113/61, pulse 80, temperature 98.5 F (36.9 C), temperature source Oral, resp. rate 18, height 5\' 10"  (1.778 m), weight 133 lb 8 oz (60.6 kg), SpO2 100 %.   ECOG: 1 General appearance: Alert, awake gentleman without distress.  Head: Atraumatic without abnormalities. Oropharynx: Mucous membranes are moist and pink. Eyes: Pupils are equal and round reactive to light. Lymph nodes: No lymphadenopathy noted in the cervical, supraclavicular, or axillary nodes  Heart: regular rate and rhythm, S1, S2 without any leg edema. Lung: Clear in all lung fields without any rhonchi or wheezes. Abdomin: Soft, nontender without any rebound or guarding.  No shifting dullness or ascites. Musculoskeletal: No clubbing or cyanosis. Skin: No ecchymosis or petechiae noted.   Lab Results: Lab Results  Component Value Date   WBC 4.5 11/24/2017   HGB 10.7 (L) 11/24/2017   HCT 33.3 (L) 11/24/2017   MCV 91.0 11/24/2017   PLT 148 11/24/2017    Results for Russell Browning, Russell SR. (MRN [id number]) as of 12/01/2017 09:40  Ref. Range 11/18/2016 08:33 02/23/2017 08:06 05/25/2017 08:00 08/24/2017 10:08 11/24/2017 07:38  Prostate Specific Ag, Serum Latest Ref Range: 0.0 - 4.0 ng/mL 0.1 0.1 0.2 0.2 0.2    EXAM: NUCLEAR MEDICINE WHOLE BODY BONE SCAN  TECHNIQUE: Whole body anterior and posterior images were obtained approximately 3 hours after intravenous injection of radiopharmaceutical.  RADIOPHARMACEUTICALS:  18.2 mCi Technetium-16m MDP IV  COMPARISON:  12/21/2010  FINDINGS: Photopenic defect at LEFT chest from pacemaker generator.  Questionable tiny focus of increased tracer localization at the RIGHT lateral aspect of the distal sternum.  No additional abnormal osseous tracer accumulation  identified to suggest osseous metastatic disease.  Minimal uptake at LEFT first MTP joint, typically degenerative.  Foci of uptake seen previously at the RIGHT ischium and RIGHT superior pubic ramus are no longer identified.  Expected urinary tract and soft tissue distribution of tracer.  IMPRESSION: Questionable new site of osseous metastatic disease at the distal RIGHT sternum.  Resolution of uptake seen previously at 2 sites in the RIGHT pelvis.        Impression and Plan:   77 year old man with:   1.  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  He is currently receiving androgen deprivation therapy alone at this time with reasonable response in his PSA.  His PSA continues to be very low at 0.2 without any major changes.  His bone scan obtained on 11/24/2017 was personally reviewed today and showed very little to no bone disease at this time.  Risks  and benefits of continuing androgen deprivation alone versus additional therapy was reviewed today.  Given his excellent response to therapy at this time we have elected to continue with androgen deprivation therapy alone and consider additional therapy in the future if his PSA starts to rise.   2. Bone pain: His pain is adequately controlled with fentanyl patch.  His pain is related to arthritis rather than prostate cancer metastasis.    3. Bone directed therapy: He is currently on Xgeva and has experienced few complications related to it.  Risks and benefits associated with this therapy was reviewed today and he elected to stop this therapy for the time being.  We will consider restarting it if he develops high-volume bone disease in the future.  4. Androgen depravation: Remains on Lupron and has no complications related to it.  He is agreeable to continue at this time.  Will receive Lupron again in 3 months.  5. Follow up: To be in 3 months to repeat laboratory testing and Lupron  injection.  15  minutes was spent with the patient face-to-face today.  More than 50% of time was dedicated to patient counseling, education and discussing future plan of care including radiology images review.   Zola Button, MD 6/20/20199:50 AM

## 2017-12-01 NOTE — Telephone Encounter (Signed)
Appointments scheduled Calendar rpinted/ pt declined AVS per 6/20 los

## 2017-12-02 LAB — CUP PACEART REMOTE DEVICE CHECK
Battery Impedance: 2450 Ohm
Battery Remaining Longevity: 25 mo
Battery Voltage: 2.73 V
Brady Statistic AP VP Percent: 0 %
Date Time Interrogation Session: 20190618181725
Implantable Lead Implant Date: 19990408
Implantable Lead Location: 753859
Implantable Lead Model: 4035
Implantable Lead Serial Number: 208202
Implantable Pulse Generator Implant Date: 20081126
Lead Channel Setting Pacing Amplitude: 2.5 V
Lead Channel Setting Pacing Pulse Width: 0.76 ms
Lead Channel Setting Sensing Sensitivity: 4 mV
MDC IDC LEAD IMPLANT DT: 19990408
MDC IDC LEAD LOCATION: 753860
MDC IDC MSMT LEADCHNL RA IMPEDANCE VALUE: 134 Ohm
MDC IDC MSMT LEADCHNL RV IMPEDANCE VALUE: 1109 Ohm
MDC IDC MSMT LEADCHNL RV PACING THRESHOLD AMPLITUDE: 1.625 V
MDC IDC MSMT LEADCHNL RV PACING THRESHOLD PULSEWIDTH: 0.4 ms
MDC IDC SET LEADCHNL RA PACING AMPLITUDE: 2 V
MDC IDC STAT BRADY AP VS PERCENT: 61 %
MDC IDC STAT BRADY AS VP PERCENT: 0 %
MDC IDC STAT BRADY AS VS PERCENT: 39 %

## 2017-12-08 DIAGNOSIS — D630 Anemia in neoplastic disease: Secondary | ICD-10-CM | POA: Diagnosis not present

## 2017-12-08 DIAGNOSIS — E782 Mixed hyperlipidemia: Secondary | ICD-10-CM | POA: Diagnosis not present

## 2017-12-08 DIAGNOSIS — Z79891 Long term (current) use of opiate analgesic: Secondary | ICD-10-CM | POA: Diagnosis not present

## 2017-12-08 DIAGNOSIS — C61 Malignant neoplasm of prostate: Secondary | ICD-10-CM | POA: Diagnosis not present

## 2017-12-08 DIAGNOSIS — Z9189 Other specified personal risk factors, not elsewhere classified: Secondary | ICD-10-CM | POA: Diagnosis not present

## 2017-12-08 DIAGNOSIS — I10 Essential (primary) hypertension: Secondary | ICD-10-CM | POA: Diagnosis not present

## 2017-12-08 DIAGNOSIS — I2510 Atherosclerotic heart disease of native coronary artery without angina pectoris: Secondary | ICD-10-CM | POA: Diagnosis not present

## 2017-12-08 DIAGNOSIS — Z681 Body mass index (BMI) 19 or less, adult: Secondary | ICD-10-CM | POA: Diagnosis not present

## 2017-12-08 DIAGNOSIS — G2581 Restless legs syndrome: Secondary | ICD-10-CM | POA: Diagnosis not present

## 2017-12-08 DIAGNOSIS — K219 Gastro-esophageal reflux disease without esophagitis: Secondary | ICD-10-CM | POA: Diagnosis not present

## 2017-12-08 DIAGNOSIS — K210 Gastro-esophageal reflux disease with esophagitis: Secondary | ICD-10-CM | POA: Diagnosis not present

## 2017-12-08 DIAGNOSIS — M545 Low back pain: Secondary | ICD-10-CM | POA: Diagnosis not present

## 2017-12-08 DIAGNOSIS — I25111 Atherosclerotic heart disease of native coronary artery with angina pectoris with documented spasm: Secondary | ICD-10-CM | POA: Diagnosis not present

## 2017-12-08 DIAGNOSIS — F331 Major depressive disorder, recurrent, moderate: Secondary | ICD-10-CM | POA: Diagnosis not present

## 2017-12-08 DIAGNOSIS — R5383 Other fatigue: Secondary | ICD-10-CM | POA: Diagnosis not present

## 2017-12-08 DIAGNOSIS — Z23 Encounter for immunization: Secondary | ICD-10-CM | POA: Diagnosis not present

## 2018-01-26 NOTE — Progress Notes (Deleted)
Cardiology Office Note  Date: 01/26/2018   ID: Russell Browning Sr., DOB 1941-04-05, MRN [id number]  PCP: Russell Bis, MD  Primary Cardiologist: Russell Lesches, MD   No chief complaint on file.   History of Present Illness: Russell Browning. is a 77 y.o. male last seen in February.  He follows with Russell. Russell Browning in the device clinic, Medtronic pacemaker in place.  Past Medical History:  Diagnosis Date  . Anemia   . CAD (coronary artery disease), native coronary artery    a. DES to proximal, mid, and distal RCA 2006. b. 12/2014 - [place] s/p  PTCA to the mid-distal previously placed RCA stents, otherwise nonobstructive CAD for continued medical therapy.  Marland Kitchen History of TIAs   . Mixed hyperlipidemia   . Pacemaker lead failure    RA lead insulation defect noted at Generator change by Russell Browning 2008, impedance now low, but lead functioning otherwise normally  . Prostate cancer metastatic to bone ([place])   . Sinus node dysfunction [facility])    Medtronic pacemaker - Russell. Russell Browning    Past Surgical History:  Procedure Laterality Date  . CARDIAC CATHETERIZATION N/A 01/06/2015   Procedure: Left Heart Cath and Coronary Angiography;  Surgeon: Troy Sine, MD;  Location: [facility];  Service: Cardiovascular;  Laterality: N/A;  . PACEMAKER INSERTION     MDT    Current Outpatient Medications  Medication Sig Dispense Refill  . aspirin EC 81 MG EC tablet Take 1 tablet (81 mg total) by mouth daily. 30 tablet 11  . Calcium Carbonate-Vitamin D (CALCIUM-VITAMIN D) 500-200 MG-UNIT per tablet Take 1 tablet by mouth daily.    . clonazePAM (KLONOPIN) 1 MG tablet Take 1 mg by mouth at bedtime.     . clopidogrel (PLAVIX) 75 MG tablet TAKE ONE TABLET BY MOUTH DAILY 90 tablet 3  . denosumab (XGEVA) 120 MG/1.7ML SOLN injection Inject 120 mg into the skin every 6 (six) weeks.    . fentaNYL (DURAGESIC - DOSED MCG/HR) 25 MCG/HR Place 1 patch onto the skin every 3 (three) days.    . isosorbide mononitrate  (IMDUR) 30 MG 24 hr tablet Take 1 tablet (30 mg total) by mouth daily. 90 tablet 3  . leuprolide (LUPRON) 22.5 MG injection Inject 22.5 mg into the muscle every 3 (three) months.    . levothyroxine (SYNTHROID, LEVOTHROID) 50 MCG tablet Take 1 tablet by mouth daily.    . nitroGLYCERIN (NITROSTAT) 0.4 MG SL tablet Place 1 tablet (0.4 mg total) under the tongue every 5 (five) minutes as needed for chest pain. 25 tablet 3  . oxyCODONE-acetaminophen (PERCOCET) 10-325 MG per tablet Take 1 tablet by mouth daily.     . pantoprazole (PROTONIX) 40 MG tablet Take 40 mg by mouth daily.    . polyethylene glycol (MIRALAX / GLYCOLAX) packet Take 17 g by mouth daily.    Marland Kitchen senna-docusate (EQ SENNA-S) 8.6-50 MG per tablet Take 2 tablets by mouth daily. 60 tablet 3  . simvastatin (ZOCOR) 40 MG tablet Take 40 mg by mouth at bedtime.      Marland Kitchen venlafaxine XR (EFFEXOR-XR) 150 MG 24 hr capsule Take 150 mg by mouth daily.    . vitamin B-12 (CYANOCOBALAMIN) 1000 MCG tablet Take 1,000 mcg by mouth daily.       No current facility-administered medications for this visit.    Allergies:  Penicillins; Phenazopyridine hcl; and Ramipril   Social History: The patient  reports that he quit smoking  about 52 years ago. His smoking use included cigarettes. He started smoking about 66 years ago. He has a 5.00 pack-year smoking history. He quit smokeless tobacco use about 39 years ago.  His smokeless tobacco use included chew. He reports that he does not drink alcohol or use drugs.   Family History: [name] family history includes Heart disease in his father and sister.   ROS:  Please see the history of present illness. Otherwise, complete review of systems is positive for {NONE DEFAULTED:18576::"none"}.  All other systems are reviewed and negative.   Physical Exam: VS:  There were no vitals taken for this visit., BMI There is no height or weight on file to calculate BMI.  Wt Readings from Last 3 Encounters:  12/01/17 133 lb  8 oz (60.6 kg)  08/31/17 138 lb 12.8 oz (63 kg)  07/25/17 134 lb 12.8 oz (61.1 kg)    General: Patient appears comfortable at rest. HEENT: Conjunctiva and lids normal, oropharynx clear with moist mucosa. Neck: Supple, no elevated JVP or carotid bruits, no thyromegaly. Lungs: Clear to auscultation, nonlabored breathing at rest. Cardiac: Regular rate and rhythm, no S3 or significant systolic murmur, no pericardial rub. Abdomen: Soft, nontender, no hepatomegaly, bowel sounds present, no guarding or rebound. Extremities: No pitting edema, distal pulses 2+. Skin: Warm and dry. Musculoskeletal: No kyphosis. Neuropsychiatric: Alert and oriented x3, affect grossly appropriate.  ECG: I personally reviewed the tracing from 06/15/2017 which showed sinus rhythm.  Recent Labwork: 11/24/2017: ALT 8; AST 25; BUN 15; Creatinine 1.15; Hemoglobin 10.7; Platelet Count 148; Potassium 4.5; Sodium 141     Component Value Date/Time   CHOL 171 01/04/2015 0431   TRIG 106 01/04/2015 0431   HDL 42 01/04/2015 0431   CHOLHDL 4.1 01/04/2015 0431   VLDL 21 01/04/2015 0431   LDLCALC 108 (H) 01/04/2015 0431    Other Studies Reviewed Today:  [facility] Myoview 06/21/2017:  No diagnostic ST segment changes to indicate ischemia.  Small, mild intensity, mid to apical inferoseptal defect is partially reversible. Suggestive of a mild ischemic territory.  This is a low risk study.  Nuclear stress EF: 56%.  Assessment and Plan:    Current medicines were reviewed with the patient today.  No orders of the defined types were placed in this encounter.   Disposition:  Signed, Satira Sark, MD, [facility] 01/26/2018 9:13 AM    [address], [place], [place] [address] Phone: [phone]; Fax: [phone]

## 2018-01-27 ENCOUNTER — Ambulatory Visit: Payer: Medicare Other | Admitting: Cardiology

## 2018-02-17 ENCOUNTER — Ambulatory Visit (INDEPENDENT_AMBULATORY_CARE_PROVIDER_SITE_OTHER): Payer: Medicare Other | Admitting: Internal Medicine

## 2018-02-17 ENCOUNTER — Encounter: Payer: Self-pay | Admitting: Internal Medicine

## 2018-02-17 VITALS — BP 126/64 | HR 87 | Ht 70.0 in | Wt 131.0 lb

## 2018-02-17 DIAGNOSIS — I495 Sick sinus syndrome: Secondary | ICD-10-CM | POA: Diagnosis not present

## 2018-02-17 DIAGNOSIS — Z950 Presence of cardiac pacemaker: Secondary | ICD-10-CM

## 2018-02-17 DIAGNOSIS — I25119 Atherosclerotic heart disease of native coronary artery with unspecified angina pectoris: Secondary | ICD-10-CM | POA: Diagnosis not present

## 2018-02-17 DIAGNOSIS — I259 Chronic ischemic heart disease, unspecified: Secondary | ICD-10-CM

## 2018-02-17 NOTE — Progress Notes (Signed)
126/64 

## 2018-02-17 NOTE — Progress Notes (Signed)
PCP: Russell Bis, MD Primary Cardiologist: Dr Russell Browning Primary EP:  Dr Russell Browning  Russell Flattery Sr. is a 77 y.o. male who presents today for routine electrophysiology followup.  Since last being seen in our clinic, the patient reports doing very well.  Today, he denies symptoms of palpitations, chest pain, shortness of breath,  lower extremity edema, dizziness, presyncope, or syncope.  The patient is otherwise without complaint today.   Past Medical History:  Diagnosis Date  . Anemia   . CAD (coronary artery disease), native coronary artery    a. DES to proximal, mid, and distal RCA 2006. b. 12/2014 - [place] s/p  PTCA to the mid-distal previously placed RCA stents, otherwise nonobstructive CAD for continued medical therapy.  Marland Kitchen History of TIAs   . Mixed hyperlipidemia   . Pacemaker lead failure    RA lead insulation defect noted at Generator change by Dr Russell Comes 2008, impedance now low, but lead functioning otherwise normally  . Prostate cancer metastatic to bone ([place])   . Sinus node dysfunction [facility])    Medtronic pacemaker - Dr. [name]   Past Surgical History:  Procedure Laterality Date  . CARDIAC CATHETERIZATION N/A 01/06/2015   Procedure: Left Heart Cath and Coronary Angiography;  Surgeon: Troy Sine, MD;  Location: [facility];  Service: Cardiovascular;  Laterality: N/A;  . PACEMAKER INSERTION     MDT    ROS- all systems are reviewed and negative except as per HPI above  Current Outpatient Medications  Medication Sig Dispense Refill  . aspirin EC 81 MG EC tablet Take 1 tablet (81 mg total) by mouth daily. 30 tablet 11  . Calcium Carbonate-Vitamin D (CALCIUM-VITAMIN D) 500-200 MG-UNIT per tablet Take 1 tablet by mouth daily.    . clonazePAM (KLONOPIN) 1 MG tablet Take 1 mg by mouth at bedtime.     . clopidogrel (PLAVIX) 75 MG tablet TAKE ONE TABLET BY MOUTH DAILY 90 tablet 3  . isosorbide mononitrate (IMDUR) 30 MG 24 hr tablet Take 1 tablet (30 mg total) by mouth  daily. 90 tablet 3  . leuprolide (LUPRON) 22.5 MG injection Inject 22.5 mg into the muscle every 3 (three) months.    . levothyroxine (SYNTHROID, LEVOTHROID) 50 MCG tablet Take 1 tablet by mouth daily.    . nitroGLYCERIN (NITROSTAT) 0.4 MG SL tablet Place 1 tablet (0.4 mg total) under the tongue every 5 (five) minutes as needed for chest pain. 25 tablet 3  . pantoprazole (PROTONIX) 40 MG tablet Take 40 mg by mouth daily.    . polyethylene glycol (MIRALAX / GLYCOLAX) packet Take 17 g by mouth daily.    . simvastatin (ZOCOR) 40 MG tablet Take 40 mg by mouth at bedtime.      Marland Kitchen venlafaxine XR (EFFEXOR-XR) 150 MG 24 hr capsule Take 150 mg by mouth daily.    . vitamin B-12 (CYANOCOBALAMIN) 1000 MCG tablet Take 1,000 mcg by mouth daily.       No current facility-administered medications for this visit.     Physical Exam: Vitals:   02/17/18 1008  BP: 126/64  Pulse: 87  SpO2: 98%  Weight: 131 lb (59.4 kg)  Height: 5\' 10"  (1.778 m)    GEN- The patient is well appearing, alert and oriented x 3 today.   Head- normocephalic, atraumatic Eyes-  Sclera clear, conjunctiva pink Ears- hearing intact Oropharynx- clear Lungs- Clear to ausculation bilaterally, normal work of breathing Chest- pacemaker pocket is well healed Heart- Regular rate  and rhythm, no murmurs, rubs or gallops, PMI not laterally displaced GI- soft, NT, ND, + BS Extremities- no clubbing, cyanosis, or edema  Pacemaker interrogation- reviewed in detail today,  See PACEART report   Assessment and Plan:  1. Symptomatic sinus bradycardia  Normal pacemaker function See Pace Art report No changes today Atrial lead impedance is chronically low.  Pt reports "Dr Russell Comes had to do my last generator change with bolt cutters and then lead got frayed.  He put silicone on it".   Battery status remains good.  2. CAD No ischemic symptoms He had RCA territory ischemia on prior myoview Managed conservatively by Dr Russell Browning and doing  well.  [facility] Return in a year  Thompson Grayer MD, [facility] 02/17/2018 10:31 AM

## 2018-02-17 NOTE — Patient Instructions (Signed)
Your physician recommends that you schedule a follow-up appointment in: [place] recommends that you continue on your current medications as directed. Please refer to the Current Medication list given to you today.  Thank you for choosing [facility]!!

## 2018-02-19 LAB — CUP PACEART INCLINIC DEVICE CHECK
Battery Remaining Longevity: 24 mo
Brady Statistic AP VP Percent: 0 %
Brady Statistic AP VS Percent: 61 %
Brady Statistic AS VP Percent: 0 %
Date Time Interrogation Session: 20190906145341
Implantable Lead Implant Date: 19990408
Implantable Lead Location: 753859
Implantable Lead Model: 4524
Implantable Lead Serial Number: 208202
Implantable Pulse Generator Implant Date: 20081126
Lead Channel Impedance Value: 1075 Ohm
Lead Channel Pacing Threshold Amplitude: 1 V
Lead Channel Pacing Threshold Amplitude: 1.25 V
Lead Channel Pacing Threshold Amplitude: 1.75 V
Lead Channel Pacing Threshold Pulse Width: 0.76 ms
Lead Channel Sensing Intrinsic Amplitude: 0.5 mV
Lead Channel Sensing Intrinsic Amplitude: 11.2 mV
Lead Channel Setting Pacing Amplitude: 2 V
Lead Channel Setting Pacing Pulse Width: 0.76 ms
Lead Channel Setting Sensing Sensitivity: 4 mV
MDC IDC LEAD IMPLANT DT: 19990408
MDC IDC LEAD LOCATION: 753860
MDC IDC MSMT BATTERY IMPEDANCE: 2509 Ohm
MDC IDC MSMT BATTERY VOLTAGE: 2.75 V
MDC IDC MSMT LEADCHNL RA IMPEDANCE VALUE: 131 Ohm
MDC IDC MSMT LEADCHNL RA PACING THRESHOLD PULSEWIDTH: 0.4 ms
MDC IDC MSMT LEADCHNL RV PACING THRESHOLD PULSEWIDTH: 0.4 ms
MDC IDC SET LEADCHNL RV PACING AMPLITUDE: 2.5 V
MDC IDC STAT BRADY AS VS PERCENT: 39 %

## 2018-02-21 DIAGNOSIS — M545 Low back pain: Secondary | ICD-10-CM | POA: Diagnosis not present

## 2018-02-21 DIAGNOSIS — Z681 Body mass index (BMI) 19 or less, adult: Secondary | ICD-10-CM | POA: Diagnosis not present

## 2018-02-24 ENCOUNTER — Inpatient Hospital Stay: Payer: Medicare Other | Attending: Oncology

## 2018-02-24 DIAGNOSIS — C61 Malignant neoplasm of prostate: Secondary | ICD-10-CM

## 2018-02-24 DIAGNOSIS — Z79899 Other long term (current) drug therapy: Secondary | ICD-10-CM | POA: Diagnosis not present

## 2018-02-24 DIAGNOSIS — Z5111 Encounter for antineoplastic chemotherapy: Secondary | ICD-10-CM | POA: Diagnosis not present

## 2018-02-24 DIAGNOSIS — C7951 Secondary malignant neoplasm of bone: Secondary | ICD-10-CM | POA: Insufficient documentation

## 2018-02-24 LAB — CBC WITH DIFFERENTIAL (CANCER CENTER ONLY)
Basophils Absolute: 0 10*3/uL (ref 0.0–0.1)
Basophils Relative: 0 %
EOS ABS: 0.1 10*3/uL (ref 0.0–0.5)
Eosinophils Relative: 2 %
HCT: 34.8 % — ABNORMAL LOW (ref 38.4–49.9)
HEMOGLOBIN: 11.2 g/dL — AB (ref 13.0–17.1)
LYMPHS ABS: 1.4 10*3/uL (ref 0.9–3.3)
Lymphocytes Relative: 29 %
MCH: 29.4 pg (ref 27.2–33.4)
MCHC: 32.2 g/dL (ref 32.0–36.0)
MCV: 91.3 fL (ref 79.3–98.0)
MONOS PCT: 7 %
Monocytes Absolute: 0.3 10*3/uL (ref 0.1–0.9)
NEUTROS PCT: 62 %
Neutro Abs: 3.1 10*3/uL (ref 1.5–6.5)
Platelet Count: 143 10*3/uL (ref 140–400)
RBC: 3.81 MIL/uL — AB (ref 4.20–5.82)
RDW: 13.9 % (ref 11.0–14.6)
WBC: 4.9 10*3/uL (ref 4.0–10.3)

## 2018-02-24 LAB — CMP (CANCER CENTER ONLY)
ALT: 9 U/L (ref 0–44)
AST: 14 U/L — AB (ref 15–41)
Albumin: 4 g/dL (ref 3.5–5.0)
Alkaline Phosphatase: 22 U/L — ABNORMAL LOW (ref 38–126)
Anion gap: 8 (ref 5–15)
BUN: 14 mg/dL (ref 8–23)
CHLORIDE: 104 mmol/L (ref 98–111)
CO2: 30 mmol/L (ref 22–32)
CREATININE: 1.11 mg/dL (ref 0.61–1.24)
Calcium: 9.3 mg/dL (ref 8.9–10.3)
GFR, Est AFR Am: >60 mL/min (ref >60)
Glucose, Bld: 93 mg/dL (ref 70–99)
Potassium: 4.1 mmol/L (ref 3.5–5.1)
Sodium: 142 mmol/L (ref 135–145)
Total Bilirubin: 0.3 mg/dL (ref 0.3–1.2)
Total Protein: 6.7 g/dL (ref 6.5–8.1)

## 2018-02-25 LAB — PROSTATE-SPECIFIC AG, SERUM (LABCORP): Prostate Specific Ag, Serum: 0.1 ng/mL (ref 0.0–4.0)

## 2018-02-28 ENCOUNTER — Telehealth: Payer: Self-pay | Admitting: Cardiology

## 2018-02-28 ENCOUNTER — Ambulatory Visit: Payer: Medicare Other | Admitting: *Deleted

## 2018-02-28 NOTE — Telephone Encounter (Signed)
Confirmed remote transmission w/ pt wife.   

## 2018-03-01 ENCOUNTER — Ambulatory Visit (INDEPENDENT_AMBULATORY_CARE_PROVIDER_SITE_OTHER): Payer: Medicare Other | Admitting: *Deleted

## 2018-03-01 ENCOUNTER — Encounter: Payer: Self-pay | Admitting: Cardiology

## 2018-03-01 DIAGNOSIS — I495 Sick sinus syndrome: Secondary | ICD-10-CM

## 2018-03-02 NOTE — Progress Notes (Signed)
Remote pacemaker transmission.   

## 2018-03-03 ENCOUNTER — Inpatient Hospital Stay: Payer: Medicare Other

## 2018-03-03 ENCOUNTER — Telehealth: Payer: Self-pay

## 2018-03-03 ENCOUNTER — Inpatient Hospital Stay (HOSPITAL_BASED_OUTPATIENT_CLINIC_OR_DEPARTMENT_OTHER): Payer: Medicare Other | Admitting: Oncology

## 2018-03-03 VITALS — BP 145/73 | HR 88 | Temp 98.3°F | Resp 18 | Ht 70.0 in | Wt 130.4 lb

## 2018-03-03 DIAGNOSIS — E291 Testicular hypofunction: Secondary | ICD-10-CM | POA: Diagnosis not present

## 2018-03-03 DIAGNOSIS — C7951 Secondary malignant neoplasm of bone: Secondary | ICD-10-CM

## 2018-03-03 DIAGNOSIS — C61 Malignant neoplasm of prostate: Secondary | ICD-10-CM

## 2018-03-03 DIAGNOSIS — Z5111 Encounter for antineoplastic chemotherapy: Secondary | ICD-10-CM | POA: Diagnosis not present

## 2018-03-03 DIAGNOSIS — Z79899 Other long term (current) drug therapy: Secondary | ICD-10-CM | POA: Diagnosis not present

## 2018-03-03 MED ORDER — LEUPROLIDE ACETATE (3 MONTH) 22.5 MG IM KIT
22.5000 mg | PACK | Freq: Once | INTRAMUSCULAR | Status: AC
  Administered 2018-03-03: 22.5 mg via INTRAMUSCULAR

## 2018-03-03 NOTE — Progress Notes (Signed)
Hematology and Oncology Follow Up Visit  Russell Browning [id number] 07-12-1940 77 y.o. 03/03/2018 9:32 AM    Principle Diagnosis:  77 year old man with advanced prostate cancer limited disease to the bone diagnosed in 2013.  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  Prior Therapy: 1. He received radiation therapy and 2 years of Lupron for locally advanced prostate cancer at the time of diagnosis in 2000.  2.  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  He was started on Lupron at that time.  3. He is S/P radiation therapy directed to the right hip (30 gray in 12 fractions) concluded in 07/2011 for palliative purposes.    Current therapy:  1. Lupron 22.5 mg every three months.   2. Delton See given every 3 months started in 10/2011.  Therapy discontinued in June 2019 based on patient's preference.  Interim History:  [name] is here for a follow-up.  Since her last visit, he reports no major changes or complaints.  He remains active and attends to activities of daily living.  He denies any worsening back pain or bone pain.  He takes no pain medication at this time.  He continues to have some periodic hot flashes associated with Lupron that are manageable with Effexor.  His performance status and activity level remains unchanged.   He does not report any headaches, blurry vision, or seizures.  He denies any dizziness or confusion.  He does not report any fevers or chills or sweats.  He does not report any chest pain, palpitation, orthopnea or leg edema.  He does not report any cough, wheezing or hemoptysis. Has not reported any nausea or vomiting.  He denies any changes in his bowel habits.  Has not reported any frequency urgency or hesitancy.  He denies any skin rashes or lesions.  He does not report any bleeding or clotting tendencies.  He denies any worsening joint pain or deformities.  Remainder  of his review of systems is negative.   Medications: I have reviewed the patient's current medications.   Current Outpatient Medications  Medication Sig Dispense Refill  . aspirin EC 81 MG EC tablet Take 1 tablet (81 mg total) by mouth daily. 30 tablet 11  . Calcium Carbonate-Vitamin D (CALCIUM-VITAMIN D) 500-200 MG-UNIT per tablet Take 1 tablet by mouth daily.    . clonazePAM (KLONOPIN) 1 MG tablet Take 1 mg by mouth at bedtime.     . clopidogrel (PLAVIX) 75 MG tablet TAKE ONE TABLET BY MOUTH DAILY 90 tablet 3  . isosorbide mononitrate (IMDUR) 30 MG 24 hr tablet Take 1 tablet (30 mg total) by mouth daily. 90 tablet 3  . leuprolide (LUPRON) 22.5 MG injection Inject 22.5 mg into the muscle every 3 (three) months.    . levothyroxine (SYNTHROID, LEVOTHROID) 50 MCG tablet Take 1 tablet by mouth daily.    . nitroGLYCERIN (NITROSTAT) 0.4 MG SL tablet Place 1 tablet (0.4 mg total) under the tongue every 5 (five) minutes as needed for chest pain. 25 tablet 3  . pantoprazole (PROTONIX) 40 MG tablet Take 40 mg by mouth daily.    . polyethylene glycol (MIRALAX / GLYCOLAX) packet Take 17 g by mouth daily.    . simvastatin (ZOCOR) 40 MG tablet Take 40 mg by mouth at bedtime.      Marland Kitchen venlafaxine XR (EFFEXOR-XR) 150 MG 24 hr capsule Take 150 mg by mouth daily.    Marland Kitchen  vitamin B-12 (CYANOCOBALAMIN) 1000 MCG tablet Take 1,000 mcg by mouth daily.       No current facility-administered medications for this visit.      Allergies:  Allergies  Allergen Reactions  . Penicillins Other (See Comments)    Passed out  . Phenazopyridine Hcl Hives  . Ramipril Hives    Past Medical History, Surgical history, Social history, and Family History remain unchanged.   Physical Exam:  Blood pressure (!) 145/73, pulse 88, temperature 98.3 F (36.8 C), temperature source Oral, resp. rate 18, height 5\' 10"  (1.778 m), weight 130 lb 6.4 oz (59.1 kg), SpO2 100 %.    ECOG: 1    General appearance: Comfortable  appearing without any discomfort Head: Normocephalic without any trauma Oropharynx: Mucous membranes are moist and pink without any thrush or ulcers. Eyes: Pupils are equal and round reactive to light. Lymph nodes: No cervical, supraclavicular, inguinal or axillary lymphadenopathy.   Heart:regular rate and rhythm.  S1 and S2 without leg edema. Lung: Clear without any rhonchi or wheezes.  No dullness to percussion. Abdomin: Soft, nontender, nondistended with good bowel sounds.  No hepatosplenomegaly. Musculoskeletal: No joint deformity or effusion.  Full range of motion noted. Neurological: No deficits noted on motor, sensory and deep tendon reflex exam. Skin: No petechial rash or dryness.  Appeared moist.    Lab Results: Lab Results  Component Value Date   WBC 4.9 02/24/2018   HGB 11.2 (L) 02/24/2018   HCT 34.8 (L) 02/24/2018   MCV 91.3 02/24/2018   PLT 143 02/24/2018          Impression and Plan:   77 year old man with:   1.  Advanced prostate cancer with limited disease to the bone diagnosed in 2012.  He is currently castration-sensitive with disease under control with androgen deprivation.  The natural course of his disease was reviewed today.  His PSA continues to be very low without any evidence of disease progression.  Risks and benefits of continuing androgen deprivation therapy alone versus adding another therapy was reviewed today and for the time being we will continue with androgen deprivation therapy alone given his preference and overall disease control status.  Patient therapy may be needed if his PSA starts to rise.   2. Bone pain: He is off all pain medication at this time and does not report any new bone pain.  3. Bone directed therapy: He has been on Xgeva in the past and opted to stop it at this point.  This therapy will continue to be on hold and will resume it in the future if he develops worsening bony metastasis.  4. Androgen depravation: Long-term  complication associated with Lupron was reviewed today and he is agreeable to continue.  He will receive Lupron 22.5 mg every 3 months.  5. Follow up: To be in 3 months for repeat clinical visit.  15  minutes was spent with the patient face-to-face today.  More than 50% of time was dedicated to reviewing the natural course of his disease, treatment options and coordinating his plan of care.   Zola Button, MD 9/20/20199:32 AM

## 2018-03-03 NOTE — Patient Instructions (Signed)
Leuprolide depot injection What is this medicine? LEUPROLIDE (loo PROE lide) is a man-made protein that acts like a natural hormone in the body. It decreases testosterone in men and decreases estrogen in women. In men, this medicine is used to treat advanced prostate cancer. In women, some forms of this medicine may be used to treat endometriosis, uterine fibroids, or other male hormone-related problems. This medicine may be used for other purposes; ask your health care provider or pharmacist if you have questions. COMMON BRAND NAME(S): Eligard, Lupron Depot, Lupron Depot-Ped, Viadur What should I tell my health care provider before I take this medicine? They need to know if you have any of these conditions: -diabetes -heart disease or previous heart attack -high blood pressure -high cholesterol -mental illness -osteoporosis -pain or difficulty passing urine -seizures -spinal cord metastasis -stroke -suicidal thoughts, plans, or attempt; a previous suicide attempt by you or a family member -tobacco smoker -unusual vaginal bleeding (women) -an unusual or allergic reaction to leuprolide, benzyl alcohol, other medicines, foods, dyes, or preservatives -pregnant or trying to get pregnant -breast-feeding How should I use this medicine? This medicine is for injection into a muscle or for injection under the skin. It is given by a health care professional in a hospital or clinic setting. The specific product will determine how it will be given to you. Make sure you understand which product you receive and how often you will receive it. Talk to your pediatrician regarding the use of this medicine in children. Special care may be needed. Overdosage: If you think you have taken too much of this medicine contact a poison control center or emergency room at once. NOTE: This medicine is only for you. Do not share this medicine with others. What if I miss a dose? It is important not to miss a dose.  Call your doctor or health care professional if you are unable to keep an appointment. Depot injections: Depot injections are given either once-monthly, every 12 weeks, every 16 weeks, or every 24 weeks depending on the product you are prescribed. The product you are prescribed will be based on if you are male or male, and your condition. Make sure you understand your product and dosing. What may interact with this medicine? Do not take this medicine with any of the following medications: -chasteberry This medicine may also interact with the following medications: -herbal or dietary supplements, like black cohosh or DHEA -male hormones, like estrogens or progestins and birth control pills, patches, rings, or injections -male hormones, like testosterone This list may not describe all possible interactions. Give your health care provider a list of all the medicines, herbs, non-prescription drugs, or dietary supplements you use. Also tell them if you smoke, drink alcohol, or use illegal drugs. Some items may interact with your medicine. What should I watch for while using this medicine? Visit your doctor or health care professional for regular checks on your progress. During the first weeks of treatment, your symptoms may get worse, but then will improve as you continue your treatment. You may get hot flashes, increased bone pain, increased difficulty passing urine, or an aggravation of nerve symptoms. Discuss these effects with your doctor or health care professional, some of them may improve with continued use of this medicine. Male patients may experience a menstrual cycle or spotting during the first months of therapy with this medicine. If this continues, contact your doctor or health care professional. What side effects may I notice from receiving this medicine? Side   effects that you should report to your doctor or health care professional as soon as possible: -allergic reactions like skin  rash, itching or hives, swelling of the face, lips, or tongue -breathing problems -chest pain -depression or memory disorders -pain in your legs or groin -pain at site where injected or implanted -seizures -severe headache -swelling of the feet and legs -suicidal thoughts or other mood changes -visual changes -vomiting Side effects that usually do not require medical attention (report to your doctor or health care professional if they continue or are bothersome): -breast swelling or tenderness -decrease in sex drive or performance -diarrhea -hot flashes -loss of appetite -muscle, joint, or bone pains -nausea -redness or irritation at site where injected or implanted -skin problems or acne This list may not describe all possible side effects. Call your doctor for medical advice about side effects. You may report side effects to FDA at 1-800-FDA-1088. Where should I keep my medicine? This drug is given in a hospital or clinic and will not be stored at home. NOTE: This sheet is a summary. It may not cover all possible information. If you have questions about this medicine, talk to your doctor, pharmacist, or health care provider.  2018 Elsevier/Gold Standard (2015-11-13 09:45:53)  

## 2018-03-03 NOTE — Telephone Encounter (Signed)
Printed avs and calender of upcoming appointment. Per 9/20 los 

## 2018-03-14 NOTE — Progress Notes (Signed)
Cardiology Office Note  Date: 03/15/2018   ID: Russell Flattery Sr., DOB August 01, 1940, MRN [id number]  PCP: Caryl Bis, MD  Primary Cardiologist: Rozann Lesches, MD   Chief Complaint  Patient presents with  . Coronary Artery Disease    History of Present Illness: Russell Carriere. is a 77 y.o. male last seen in February.  He presents for a routine follow-up visit.  Overall doing well without significant angina or nitroglycerin use.  He remains functional with ADLs including yard work.  He follows in the device clinic with Dr. Rayann Heman, Medtronic pacemaker in place.  Recent device interrogation indicated 2 mode switches with no high ventricular rates.  He does not report any palpitations or dizziness.  States that he will be having lab work and physical with Dr. Quillian Quince over the next 2 weeks.  I reviewed his medications which are outlined below.  Current cardiac regimen includes aspirin, Plavix, Imdur, and Zocor.  Past Medical History:  Diagnosis Date  . Anemia   . CAD (coronary artery disease), native coronary artery    a. DES to proximal, mid, and distal RCA 2006. b. 12/2014 - [place] s/p  PTCA to the mid-distal previously placed RCA stents, otherwise nonobstructive CAD for continued medical therapy.  Marland Kitchen History of TIAs   . Mixed hyperlipidemia   . 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 Prostate cancer metastatic to bone ([place])   . Sinus node dysfunction [facility])    Medtronic pacemaker - Dr. Rayann Heman    Past Surgical History:  Procedure Laterality Date  . CARDIAC CATHETERIZATION N/A 01/06/2015   Procedure: Left Heart Cath and Coronary Angiography;  Surgeon: Troy Sine, MD;  Location: [facility];  Service: Cardiovascular;  Laterality: N/A;  . PACEMAKER INSERTION     MDT    Current Outpatient Medications  Medication Sig Dispense Refill  . aspirin EC 81 MG  EC tablet Take 1 tablet (81 mg total) by mouth daily. 30 tablet 11  . Calcium Carbonate-Vitamin D (CALCIUM-VITAMIN D) 500-200 MG-UNIT per tablet Take 1 tablet by mouth daily.    . clonazePAM (KLONOPIN) 1 MG tablet Take 1 mg by mouth at bedtime.     . clopidogrel (PLAVIX) 75 MG tablet TAKE ONE TABLET BY MOUTH DAILY 90 tablet 3  . isosorbide mononitrate (IMDUR) 30 MG 24 hr tablet Take 1 tablet (30 mg total) by mouth daily. 90 tablet 3  . leuprolide (LUPRON) 22.5 MG injection Inject 22.5 mg into the muscle every 3 (three) months.    . levothyroxine (SYNTHROID, LEVOTHROID) 50 MCG tablet Take 1 tablet by mouth daily.    . nitroGLYCERIN (NITROSTAT) 0.4 MG SL tablet Place 1 tablet (0.4 mg total) under the tongue every 5 (five) minutes as needed for chest pain. 25 tablet 3  . pantoprazole (PROTONIX) 40 MG tablet Take 40 mg by mouth daily.    . polyethylene glycol (MIRALAX / GLYCOLAX) packet Take 17 g by mouth daily.    . simvastatin (ZOCOR) 40 MG tablet Take 40 mg by mouth at bedtime.      Marland Kitchen venlafaxine XR (EFFEXOR-XR) 150 MG 24 hr capsule Take 150 mg by mouth daily.    . vitamin B-12 (CYANOCOBALAMIN) 1000 MCG tablet Take 1,000 mcg by mouth daily.       No current facility-administered medications for this visit.    Allergies:  Penicillins; Phenazopyridine hcl; and Ramipril   Social History: The patient  reports that he quit smoking about 52 years ago. His smoking use included cigarettes. He started smoking about 66 years ago. He has a 5.00 pack-year smoking history. He quit smokeless tobacco use about 39 years ago.  His smokeless tobacco use included chew. He reports that he does not drink alcohol or use drugs.   ROS:  Please see the history of present illness. Otherwise, complete review of systems is positive for none.  All other systems are reviewed and negative.   Physical Exam: VS:  BP 122/64   Pulse 65   Ht 5' 10.5" (1.791 m)   Wt 131 lb 6.4 oz (59.6 kg)   SpO2 97%   BMI 18.59 kg/m , BMI  Body mass index is 18.59 kg/m.  Wt Readings from Last 3 Encounters:  03/15/18 131 lb 6.4 oz (59.6 kg)  03/03/18 130 lb 6.4 oz (59.1 kg)  02/17/18 131 lb (59.4 kg)    General: Patient appears comfortable at rest. HEENT: Conjunctiva and lids normal, oropharynx clear. Neck: Supple, no elevated JVP or carotid bruits, no thyromegaly. Lungs: Clear to auscultation, nonlabored breathing at rest. Cardiac: Regular rate and rhythm, no S3 or significant systolic murmur. Abdomen: Soft, nontender, bowel sounds present. Extremities: No pitting edema, distal pulses 2+. Skin: Warm and dry. Musculoskeletal: No kyphosis. Neuropsychiatric: Alert and oriented x3, affect grossly appropriate.  ECG: I personally reviewed the tracing from 06/15/2017 which showed sinus rhythm.  Recent Labwork: 02/24/2018: ALT 9; AST 14; BUN 14; Creatinine 1.11; Hemoglobin 11.2; Platelet Count 143; Potassium 4.1; Sodium 142     Component Value Date/Time   CHOL 171 01/04/2015 0431   TRIG 106 01/04/2015 0431   HDL 42 01/04/2015 0431   CHOLHDL 4.1 01/04/2015 0431   VLDL 21 01/04/2015 0431   LDLCALC 108 (H) 01/04/2015 0431    Other Studies Reviewed Today:  [facility] Myoview 06/21/2017:  No diagnostic ST segment changes to indicate ischemia.  Small, mild intensity, mid to apical inferoseptal defect is partially reversible. Suggestive of a mild ischemic territory.  This is a low risk study.  Nuclear stress EF: 56%.  Assessment and Plan:  1.  CAD with history of previous RCA interventions, last being angioplasty of the mid to distal vessel in 2016 at prior stent site.  He reports good angina control on medical therapy including Imdur.  Myoview study from January and this year revealed mild mid to apical inferoseptal ischemia.  Continue with current plan.  2.  Mixed hyperlipidemia, on Zocor.  He is due for follow-up lab work and visit with Dr. Quillian Quince in the next few weeks.  3.  Sinus node dysfunction with Medtronic  pacemaker in place.  He continues to follow with Dr. Rayann Heman.  4.  Prior history of TIA.  No new symptoms.  He remains on aspirin and statin.  Current medicines were reviewed with the patient today.  Disposition: Follow-up in 6 months.  Signed, Satira Sark, MD, [facility] 03/15/2018 9:11 AM    [facility] [place], [place], [place] [address] Phone: [phone]; Fax: [phone]

## 2018-03-15 ENCOUNTER — Encounter: Payer: Self-pay | Admitting: Cardiology

## 2018-03-15 ENCOUNTER — Ambulatory Visit (INDEPENDENT_AMBULATORY_CARE_PROVIDER_SITE_OTHER): Payer: Medicare Other | Admitting: Cardiology

## 2018-03-15 VITALS — BP 122/64 | HR 65 | Ht 70.5 in | Wt 131.4 lb

## 2018-03-15 DIAGNOSIS — I259 Chronic ischemic heart disease, unspecified: Secondary | ICD-10-CM

## 2018-03-15 DIAGNOSIS — E782 Mixed hyperlipidemia: Secondary | ICD-10-CM

## 2018-03-15 DIAGNOSIS — Z8673 Personal history of transient ischemic attack (TIA), and cerebral infarction without residual deficits: Secondary | ICD-10-CM | POA: Diagnosis not present

## 2018-03-15 DIAGNOSIS — Z950 Presence of cardiac pacemaker: Secondary | ICD-10-CM | POA: Diagnosis not present

## 2018-03-15 DIAGNOSIS — I25119 Atherosclerotic heart disease of native coronary artery with unspecified angina pectoris: Secondary | ICD-10-CM

## 2018-03-15 DIAGNOSIS — I495 Sick sinus syndrome: Secondary | ICD-10-CM | POA: Diagnosis not present

## 2018-03-15 NOTE — Patient Instructions (Addendum)

## 2018-03-20 DIAGNOSIS — Z681 Body mass index (BMI) 19 or less, adult: Secondary | ICD-10-CM | POA: Diagnosis not present

## 2018-03-20 DIAGNOSIS — J069 Acute upper respiratory infection, unspecified: Secondary | ICD-10-CM | POA: Diagnosis not present

## 2018-03-20 DIAGNOSIS — R5383 Other fatigue: Secondary | ICD-10-CM | POA: Diagnosis not present

## 2018-03-24 LAB — CUP PACEART REMOTE DEVICE CHECK
Battery Impedance: 2634 Ohm
Battery Remaining Longevity: 23 mo
Brady Statistic AP VP Percent: 0 %
Brady Statistic AP VS Percent: 48 %
Brady Statistic AS VP Percent: 0 %
Brady Statistic AS VS Percent: 52 %
Implantable Lead Implant Date: 19990408
Implantable Lead Location: 753859
Implantable Lead Model: 4524
Implantable Lead Serial Number: 208202
Implantable Pulse Generator Implant Date: 20081126
Lead Channel Impedance Value: 1106 Ohm
Lead Channel Impedance Value: 131 Ohm
Lead Channel Pacing Threshold Amplitude: 2.5 V
Lead Channel Pacing Threshold Pulse Width: 0.4 ms
Lead Channel Setting Pacing Amplitude: 2 V
Lead Channel Setting Pacing Amplitude: 2.5 V
Lead Channel Setting Pacing Pulse Width: 0.76 ms
Lead Channel Setting Sensing Sensitivity: 5.6 mV
MDC IDC LEAD IMPLANT DT: 19990408
MDC IDC LEAD LOCATION: 753860
MDC IDC MSMT BATTERY VOLTAGE: 2.74 V
MDC IDC SESS DTM: 20190918174619

## 2018-03-29 DIAGNOSIS — C61 Malignant neoplasm of prostate: Secondary | ICD-10-CM | POA: Diagnosis not present

## 2018-03-29 DIAGNOSIS — E782 Mixed hyperlipidemia: Secondary | ICD-10-CM | POA: Diagnosis not present

## 2018-03-29 DIAGNOSIS — Z79891 Long term (current) use of opiate analgesic: Secondary | ICD-10-CM | POA: Diagnosis not present

## 2018-03-29 DIAGNOSIS — I10 Essential (primary) hypertension: Secondary | ICD-10-CM | POA: Diagnosis not present

## 2018-03-29 DIAGNOSIS — Z9189 Other specified personal risk factors, not elsewhere classified: Secondary | ICD-10-CM | POA: Diagnosis not present

## 2018-03-29 DIAGNOSIS — K210 Gastro-esophageal reflux disease with esophagitis: Secondary | ICD-10-CM | POA: Diagnosis not present

## 2018-03-29 DIAGNOSIS — G2581 Restless legs syndrome: Secondary | ICD-10-CM | POA: Diagnosis not present

## 2018-04-03 DIAGNOSIS — K219 Gastro-esophageal reflux disease without esophagitis: Secondary | ICD-10-CM | POA: Diagnosis not present

## 2018-04-03 DIAGNOSIS — I10 Essential (primary) hypertension: Secondary | ICD-10-CM | POA: Diagnosis not present

## 2018-04-03 DIAGNOSIS — M545 Low back pain: Secondary | ICD-10-CM | POA: Diagnosis not present

## 2018-04-03 DIAGNOSIS — G2581 Restless legs syndrome: Secondary | ICD-10-CM | POA: Diagnosis not present

## 2018-04-03 DIAGNOSIS — Z0001 Encounter for general adult medical examination with abnormal findings: Secondary | ICD-10-CM | POA: Diagnosis not present

## 2018-04-03 DIAGNOSIS — Z681 Body mass index (BMI) 19 or less, adult: Secondary | ICD-10-CM | POA: Diagnosis not present

## 2018-04-03 DIAGNOSIS — D630 Anemia in neoplastic disease: Secondary | ICD-10-CM | POA: Diagnosis not present

## 2018-04-03 DIAGNOSIS — C61 Malignant neoplasm of prostate: Secondary | ICD-10-CM | POA: Diagnosis not present

## 2018-04-03 DIAGNOSIS — F331 Major depressive disorder, recurrent, moderate: Secondary | ICD-10-CM | POA: Diagnosis not present

## 2018-04-03 DIAGNOSIS — D692 Other nonthrombocytopenic purpura: Secondary | ICD-10-CM | POA: Diagnosis not present

## 2018-04-03 DIAGNOSIS — I25119 Atherosclerotic heart disease of native coronary artery with unspecified angina pectoris: Secondary | ICD-10-CM | POA: Diagnosis not present

## 2018-04-03 DIAGNOSIS — E782 Mixed hyperlipidemia: Secondary | ICD-10-CM | POA: Diagnosis not present

## 2018-04-03 DIAGNOSIS — Z23 Encounter for immunization: Secondary | ICD-10-CM | POA: Diagnosis not present

## 2018-05-02 ENCOUNTER — Other Ambulatory Visit: Payer: Self-pay

## 2018-05-20 IMAGING — NM NM MYOCAR MULTI W/SPECT W/WALL MOTION & EF
2 series · 12 of 12 positions shown · non-contrast
Comparison: none

[Series 1: rest · 6.51mm/px · 6 of 64 frames shown]
[frame 6/64]
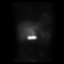
[frame 16/64]
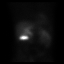
[frame 27/64]
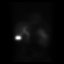
[frame 38/64]
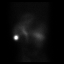
[frame 48/64]
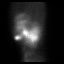
[frame 59/64]
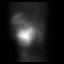

[Series 3: stress gated - perfusion · 6.51mm/px · 6 of 64 frames shown]
[frame 6/64]
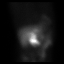
[frame 16/64]
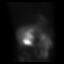
[frame 27/64]
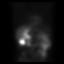
[frame 38/64]
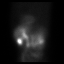
[frame 48/64]
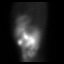
[frame 59/64]
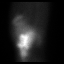

[12 of 12 positions shown; findings below may reference images not displayed]

Canned report from images found in remote index.

Refer to host system for actual result text.

## 2018-05-31 ENCOUNTER — Telehealth: Payer: Self-pay

## 2018-05-31 ENCOUNTER — Ambulatory Visit (INDEPENDENT_AMBULATORY_CARE_PROVIDER_SITE_OTHER): Payer: Medicare Other

## 2018-05-31 DIAGNOSIS — I495 Sick sinus syndrome: Secondary | ICD-10-CM

## 2018-05-31 LAB — CUP PACEART REMOTE DEVICE CHECK
Battery Remaining Longevity: 22 mo
Battery Voltage: 2.74 V
Brady Statistic AP VP Percent: 0 %
Brady Statistic AS VP Percent: 0 %
Brady Statistic AS VS Percent: 50 %
Date Time Interrogation Session: 20191218155146
Implantable Lead Implant Date: 19990408
Implantable Lead Implant Date: 19990408
Implantable Lead Location: 753859
Implantable Lead Model: 4524
Implantable Lead Serial Number: 208202
Implantable Pulse Generator Implant Date: 20081126
Lead Channel Impedance Value: 1140 Ohm
Lead Channel Pacing Threshold Amplitude: 1.75 V
Lead Channel Pacing Threshold Pulse Width: 0.4 ms
Lead Channel Setting Pacing Amplitude: 2 V
Lead Channel Setting Pacing Amplitude: 2.5 V
Lead Channel Setting Pacing Pulse Width: 0.76 ms
Lead Channel Setting Sensing Sensitivity: 5.6 mV
MDC IDC LEAD LOCATION: 753860
MDC IDC MSMT BATTERY IMPEDANCE: 2738 Ohm
MDC IDC MSMT LEADCHNL RA IMPEDANCE VALUE: 129 Ohm
MDC IDC STAT BRADY AP VS PERCENT: 50 %

## 2018-05-31 NOTE — Telephone Encounter (Signed)
Spoke with pt and reminded pt of remote transmission that is due today. Pt verbalized understanding.   

## 2018-06-01 ENCOUNTER — Encounter: Payer: Self-pay | Admitting: Cardiology

## 2018-06-01 NOTE — Progress Notes (Signed)
Remote pacemaker transmission.   

## 2018-06-02 ENCOUNTER — Inpatient Hospital Stay: Payer: Medicare Other

## 2018-06-02 ENCOUNTER — Inpatient Hospital Stay: Payer: Medicare Other | Attending: Oncology

## 2018-06-02 ENCOUNTER — Inpatient Hospital Stay (HOSPITAL_BASED_OUTPATIENT_CLINIC_OR_DEPARTMENT_OTHER): Payer: Medicare Other | Admitting: Oncology

## 2018-06-02 ENCOUNTER — Telehealth: Payer: Self-pay | Admitting: Oncology

## 2018-06-02 VITALS — BP 140/72 | HR 96 | Temp 97.8°F | Resp 18 | Ht 70.5 in | Wt 134.3 lb

## 2018-06-02 DIAGNOSIS — Z5111 Encounter for antineoplastic chemotherapy: Secondary | ICD-10-CM | POA: Insufficient documentation

## 2018-06-02 DIAGNOSIS — N538 Other male sexual dysfunction: Secondary | ICD-10-CM

## 2018-06-02 DIAGNOSIS — E291 Testicular hypofunction: Secondary | ICD-10-CM

## 2018-06-02 DIAGNOSIS — C61 Malignant neoplasm of prostate: Secondary | ICD-10-CM | POA: Insufficient documentation

## 2018-06-02 DIAGNOSIS — C7951 Secondary malignant neoplasm of bone: Secondary | ICD-10-CM | POA: Diagnosis not present

## 2018-06-02 DIAGNOSIS — R61 Generalized hyperhidrosis: Secondary | ICD-10-CM

## 2018-06-02 LAB — CMP (CANCER CENTER ONLY)
ALBUMIN: 4.2 g/dL (ref 3.5–5.0)
ALT: 9 U/L (ref 0–44)
AST: 16 U/L (ref 15–41)
Alkaline Phosphatase: 19 U/L — ABNORMAL LOW (ref 38–126)
Anion gap: 8 (ref 5–15)
BUN: 12 mg/dL (ref 8–23)
CHLORIDE: 103 mmol/L (ref 98–111)
CO2: 28 mmol/L (ref 22–32)
Calcium: 9.3 mg/dL (ref 8.9–10.3)
Creatinine: 1.09 mg/dL (ref 0.61–1.24)
GFR, Est AFR Am: >60 mL/min (ref >60)
GFR, Estimated: >60 mL/min (ref >60)
GLUCOSE: 80 mg/dL (ref 70–99)
POTASSIUM: 4.4 mmol/L (ref 3.5–5.1)
SODIUM: 139 mmol/L (ref 135–145)
Total Bilirubin: 0.2 mg/dL — ABNORMAL LOW (ref 0.3–1.2)
Total Protein: 6.9 g/dL (ref 6.5–8.1)

## 2018-06-02 LAB — CBC WITH DIFFERENTIAL (CANCER CENTER ONLY)
ABS IMMATURE GRANULOCYTES: 0.01 10*3/uL (ref 0.00–0.07)
Basophils Absolute: 0 10*3/uL (ref 0.0–0.1)
Basophils Relative: 1 %
Eosinophils Absolute: 0.1 10*3/uL (ref 0.0–0.5)
Eosinophils Relative: 2 %
HCT: 36.4 % — ABNORMAL LOW (ref 39.0–52.0)
HEMOGLOBIN: 12 g/dL — AB (ref 13.0–17.0)
IMMATURE GRANULOCYTES: 0 %
LYMPHS PCT: 23 %
Lymphs Abs: 1.3 10*3/uL (ref 0.7–4.0)
MCH: 30 pg (ref 26.0–34.0)
MCHC: 33 g/dL (ref 30.0–36.0)
MCV: 91 fL (ref 80.0–100.0)
MONOS PCT: 9 %
Monocytes Absolute: 0.5 10*3/uL (ref 0.1–1.0)
NEUTROS ABS: 3.6 10*3/uL (ref 1.7–7.7)
Neutrophils Relative %: 65 %
Platelet Count: 173 10*3/uL (ref 150–400)
RBC: 4 MIL/uL — ABNORMAL LOW (ref 4.22–5.81)
RDW: 14 % (ref 11.5–15.5)
WBC Count: 5.6 10*3/uL (ref 4.0–10.5)
nRBC: 0 % (ref 0.0–0.2)

## 2018-06-02 MED ORDER — LEUPROLIDE ACETATE (3 MONTH) 22.5 MG IM KIT
22.5000 mg | PACK | Freq: Once | INTRAMUSCULAR | Status: AC
  Administered 2018-06-02: 22.5 mg via INTRAMUSCULAR

## 2018-06-02 NOTE — Patient Instructions (Signed)

## 2018-06-02 NOTE — Progress Notes (Signed)
Hematology and Oncology Follow Up Visit  Russell Browning [id number] [birth year] 77 y.o. 06/02/2018 10:41 AM    Principle Diagnosis:  77 year old man with castration-sensitive prostate cancer diagnosed in 2013.    Prior Therapy: 1. He received radiation therapy and 2 years of Lupron for locally advanced prostate cancer at the time of diagnosis in 2000.  2.  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  He was started on Lupron at that time.  3. He is S/P radiation therapy directed to the right hip (30 gray in 12 fractions) concluded in 07/2011 for palliative purposes.    Current therapy:  1. Lupron 22.5 mg every three months.   2. Delton See given every 3 months started in 10/2011.  Therapy held in June 2019.  Interim History:  Russell Browning presents today for a follow-up.  Since the last visit, he reports no changes in his health.  He continues to be reasonable health without any recent hospitalization or illnesses.  He tolerated Lupron with minor side effects at this time.  Does report some hot flashes and sexual dysfunction but otherwise manageable at this time.  His performance status and quality of life remain excellent.  He does not require any pain medication at this time.  His bone pain has resolved.   He does not report any headaches, blurry vision, or seizures.  He denies any alteration mental status or lethargy.  He does not report any fevers or chills or sweats.  He does not report any chest pain, palpitation, orthopnea or leg edema.  He does not report any cough, wheezing or hemoptysis. Has not reported any nausea or vomiting or abdominal distention.  He denies any constipation or diarrhea.  Has not reported any frequency urgency or hesitancy.  He denies any skin rashes or lesions.  He does not report any ecchymosis or petechiae.  He denies any arthralgias or myalgias.  Denies any heat or cold intolerance.  Remainder of his review of  systems is negative.   Medications: I have reviewed the patient's current medications.   Current Outpatient Medications  Medication Sig Dispense Refill  . aspirin EC 81 MG EC tablet Take 1 tablet (81 mg total) by mouth daily. 30 tablet 11  . Calcium Carbonate-Vitamin D (CALCIUM-VITAMIN D) 500-200 MG-UNIT per tablet Take 1 tablet by mouth daily.    . clonazePAM (KLONOPIN) 1 MG tablet Take 1 mg by mouth at bedtime.     . clopidogrel (PLAVIX) 75 MG tablet TAKE ONE TABLET BY MOUTH DAILY 90 tablet 3  . isosorbide mononitrate (IMDUR) 30 MG 24 hr tablet Take 1 tablet (30 mg total) by mouth daily. 90 tablet 3  . leuprolide (LUPRON) 22.5 MG injection Inject 22.5 mg into the muscle every 3 (three) months.    . levothyroxine (SYNTHROID, LEVOTHROID) 50 MCG tablet Take 1 tablet by mouth daily.    . nitroGLYCERIN (NITROSTAT) 0.4 MG SL tablet Place 1 tablet (0.4 mg total) under the tongue every 5 (five) minutes as needed for chest pain. 25 tablet 3  . pantoprazole (PROTONIX) 40 MG tablet Take 40 mg by mouth daily.    . polyethylene glycol (MIRALAX / GLYCOLAX) packet Take 17 g by mouth daily.    . simvastatin (ZOCOR) 40 MG tablet Take 40 mg by mouth at bedtime.      Marland Kitchen venlafaxine XR (EFFEXOR-XR) 150 MG 24 hr capsule Take 150 mg by mouth daily.    Marland Kitchen  vitamin B-12 (CYANOCOBALAMIN) 1000 MCG tablet Take 1,000 mcg by mouth daily.       No current facility-administered medications for this visit.      Allergies:  Allergies  Allergen Reactions  . Penicillins Other (See Comments)    Passed out  . Phenazopyridine Hcl Hives  . Ramipril Hives    Past Medical History, Surgical history, Social history, and Family History remain unchanged.   Physical Exam:  Blood pressure 140/72, pulse 96, temperature 97.8 F (36.6 C), temperature source Oral, resp. rate 18, height 5' 10.5" (1.791 m), weight 134 lb 4.8 oz (60.9 kg), SpO2 100 %.     ECOG: 1    General appearance: Alert, awake without any  distress. Head: Atraumatic without abnormalities Oropharynx: Without any thrush or ulcers. Eyes: No scleral icterus. Lymph nodes: No lymphadenopathy noted in the cervical, supraclavicular, or axillary nodes Heart:regular rate and rhythm, without any murmurs or gallops.   Lung: Clear to auscultation without any rhonchi, wheezes or dullness to percussion. Abdomin: Soft, nontender without any shifting dullness or ascites. Musculoskeletal: No clubbing or cyanosis. Neurological: No motor or sensory deficits. Skin: No rashes or lesions.    Lab Results: Lab Results  Component Value Date   WBC 5.6 06/02/2018   HGB 12.0 (L) 06/02/2018   HCT 36.4 (L) 06/02/2018   MCV 91.0 06/02/2018   PLT 173 06/02/2018     Results for [name], [name] (MRN [id number]) as of 06/02/2018 10:42  Ref. Range 08/24/2017 10:08 11/24/2017 07:38 02/24/2018 09:02  Prostate Specific Ag, Serum Latest Ref Range: 0.0 - 4.0 ng/mL 0.2 0.2 0.1       Impression and Plan:   77 year old man with:   1.  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  He remains on androgen deprivation therapy alone with a PSA continues to be under excellent control.  His PSA in September 2019 was 0.1 and he remains asymptomatic from his disease.  Risks and benefits of continuing this approach as well as the role of additional therapy was reiterated.  The role of Zytiga, Xtandi and systemic chemotherapy was discussed and the castration-sensitive space as well as the possibility of developing castration resistant disease and using those modalities and then.  2. Bone pain: Resolved at this time and does not require any pain medication.  3. Bone directed therapy: He declined Xgeva therapy at this time.  Risks and benefits of restarting this treatment was discussed today but continues to decline.  4. Androgen depravation: He remains on Lupron 22.5 mg every 3 months.  Long-term complication associated with this therapy  including osteoporosis, weight gain and sexual dysfunction reviewed.  He is agreeable to proceed.  5. Follow up: Will be in 3 months.  15  minutes was spent with the patient face-to-face today.  More than 50% of time was dedicated to discussing his disease status, laboratory data and treatment options.   Zola Button, MD 12/20/201910:41 AM

## 2018-06-02 NOTE — Telephone Encounter (Signed)
Gave patient avs report and appointments for March  °

## 2018-06-03 LAB — PROSTATE-SPECIFIC AG, SERUM (LABCORP): PROSTATE SPECIFIC AG, SERUM: 0.2 ng/mL (ref 0.0–4.0)

## 2018-06-03 LAB — TESTOSTERONE: Testosterone: <3 ng/dL — ABNORMAL LOW (ref 264–916)

## 2018-06-05 ENCOUNTER — Telehealth: Payer: Self-pay

## 2018-06-05 NOTE — Telephone Encounter (Signed)
-----   Message from Wyatt Portela, MD sent at 06/05/2018  8:47 AM EST ----- Please let him know his PSA is still low.

## 2018-06-05 NOTE — Telephone Encounter (Signed)
Contacted patient and made aware of PSA results. 

## 2018-06-12 DIAGNOSIS — E782 Mixed hyperlipidemia: Secondary | ICD-10-CM | POA: Diagnosis not present

## 2018-06-12 DIAGNOSIS — C61 Malignant neoplasm of prostate: Secondary | ICD-10-CM | POA: Diagnosis not present

## 2018-06-12 DIAGNOSIS — G2581 Restless legs syndrome: Secondary | ICD-10-CM | POA: Diagnosis not present

## 2018-06-12 DIAGNOSIS — F331 Major depressive disorder, recurrent, moderate: Secondary | ICD-10-CM | POA: Diagnosis not present

## 2018-06-19 ENCOUNTER — Encounter: Payer: Self-pay | Admitting: *Deleted

## 2018-06-21 DIAGNOSIS — R509 Fever, unspecified: Secondary | ICD-10-CM | POA: Diagnosis not present

## 2018-06-21 DIAGNOSIS — Z6824 Body mass index (BMI) 24.0-24.9, adult: Secondary | ICD-10-CM | POA: Diagnosis not present

## 2018-06-21 DIAGNOSIS — R05 Cough: Secondary | ICD-10-CM | POA: Diagnosis not present

## 2018-06-22 NOTE — Telephone Encounter (Signed)
Opened in error

## 2018-06-29 DIAGNOSIS — Z79891 Long term (current) use of opiate analgesic: Secondary | ICD-10-CM | POA: Diagnosis not present

## 2018-06-29 DIAGNOSIS — Z9189 Other specified personal risk factors, not elsewhere classified: Secondary | ICD-10-CM | POA: Diagnosis not present

## 2018-06-29 DIAGNOSIS — E782 Mixed hyperlipidemia: Secondary | ICD-10-CM | POA: Diagnosis not present

## 2018-06-29 DIAGNOSIS — I10 Essential (primary) hypertension: Secondary | ICD-10-CM | POA: Diagnosis not present

## 2018-06-29 DIAGNOSIS — K210 Gastro-esophageal reflux disease with esophagitis: Secondary | ICD-10-CM | POA: Diagnosis not present

## 2018-07-06 DIAGNOSIS — I25119 Atherosclerotic heart disease of native coronary artery with unspecified angina pectoris: Secondary | ICD-10-CM | POA: Diagnosis not present

## 2018-07-06 DIAGNOSIS — I10 Essential (primary) hypertension: Secondary | ICD-10-CM | POA: Diagnosis not present

## 2018-07-06 DIAGNOSIS — D630 Anemia in neoplastic disease: Secondary | ICD-10-CM | POA: Diagnosis not present

## 2018-07-06 DIAGNOSIS — D692 Other nonthrombocytopenic purpura: Secondary | ICD-10-CM | POA: Diagnosis not present

## 2018-07-06 DIAGNOSIS — E782 Mixed hyperlipidemia: Secondary | ICD-10-CM | POA: Diagnosis not present

## 2018-07-06 DIAGNOSIS — K219 Gastro-esophageal reflux disease without esophagitis: Secondary | ICD-10-CM | POA: Diagnosis not present

## 2018-07-06 DIAGNOSIS — M545 Low back pain: Secondary | ICD-10-CM | POA: Diagnosis not present

## 2018-07-06 DIAGNOSIS — C61 Malignant neoplasm of prostate: Secondary | ICD-10-CM | POA: Diagnosis not present

## 2018-07-13 DIAGNOSIS — I10 Essential (primary) hypertension: Secondary | ICD-10-CM | POA: Diagnosis not present

## 2018-07-13 DIAGNOSIS — F331 Major depressive disorder, recurrent, moderate: Secondary | ICD-10-CM | POA: Diagnosis not present

## 2018-07-13 DIAGNOSIS — E782 Mixed hyperlipidemia: Secondary | ICD-10-CM | POA: Diagnosis not present

## 2018-08-30 ENCOUNTER — Other Ambulatory Visit: Payer: Self-pay

## 2018-08-30 ENCOUNTER — Ambulatory Visit (INDEPENDENT_AMBULATORY_CARE_PROVIDER_SITE_OTHER): Payer: Medicare Other | Admitting: *Deleted

## 2018-08-30 DIAGNOSIS — I495 Sick sinus syndrome: Secondary | ICD-10-CM

## 2018-08-31 ENCOUNTER — Telehealth: Payer: Self-pay

## 2018-08-31 LAB — CUP PACEART REMOTE DEVICE CHECK
Battery Impedance: 2992 Ohm
Battery Remaining Longevity: 19 mo
Battery Voltage: 2.69 V
Brady Statistic AP VP Percent: 0 %
Brady Statistic AP VS Percent: 50 %
Brady Statistic AS VP Percent: 0 %
Brady Statistic AS VS Percent: 50 %
Implantable Lead Implant Date: 19990408
Implantable Lead Implant Date: 19990408
Implantable Lead Location: 753859
Implantable Lead Location: 753860
Implantable Lead Model: 4035
Implantable Lead Model: 4524
Implantable Lead Serial Number: 208202
Implantable Pulse Generator Implant Date: 20081126
Lead Channel Impedance Value: 1177 Ohm
Lead Channel Impedance Value: 127 Ohm
Lead Channel Pacing Threshold Amplitude: 1.75 V
Lead Channel Setting Pacing Amplitude: 2 V
Lead Channel Setting Pacing Amplitude: 2.5 V
Lead Channel Setting Pacing Pulse Width: 0.76 ms
Lead Channel Setting Sensing Sensitivity: 5.6 mV
MDC IDC MSMT LEADCHNL RV PACING THRESHOLD PULSEWIDTH: 0.4 ms
MDC IDC SESS DTM: 20200318164728

## 2018-08-31 NOTE — Telephone Encounter (Signed)
Contacted patient for screening prior to appointment. Patient has not traveled recently, no respiratory symptoms or fever, and has not been around anyone that meets the criteria either. He is aware that he will be screened upon arrival for appointment as well.

## 2018-09-01 ENCOUNTER — Inpatient Hospital Stay: Payer: Medicare Other

## 2018-09-01 ENCOUNTER — Inpatient Hospital Stay: Payer: Medicare Other | Attending: Oncology | Admitting: Oncology

## 2018-09-01 ENCOUNTER — Telehealth: Payer: Self-pay | Admitting: Oncology

## 2018-09-01 ENCOUNTER — Other Ambulatory Visit: Payer: Self-pay

## 2018-09-01 VITALS — BP 112/66 | HR 101 | Temp 98.0°F | Resp 18 | Ht 70.5 in | Wt 135.9 lb

## 2018-09-01 DIAGNOSIS — C7951 Secondary malignant neoplasm of bone: Secondary | ICD-10-CM | POA: Diagnosis not present

## 2018-09-01 DIAGNOSIS — C61 Malignant neoplasm of prostate: Secondary | ICD-10-CM | POA: Insufficient documentation

## 2018-09-01 DIAGNOSIS — Z5111 Encounter for antineoplastic chemotherapy: Secondary | ICD-10-CM | POA: Diagnosis not present

## 2018-09-01 DIAGNOSIS — R232 Flushing: Secondary | ICD-10-CM

## 2018-09-01 DIAGNOSIS — E291 Testicular hypofunction: Secondary | ICD-10-CM

## 2018-09-01 LAB — CBC WITH DIFFERENTIAL (CANCER CENTER ONLY)
Abs Immature Granulocytes: 0 10*3/uL (ref 0.00–0.07)
Basophils Absolute: 0 10*3/uL (ref 0.0–0.1)
Basophils Relative: 1 %
Eosinophils Absolute: 0.1 10*3/uL (ref 0.0–0.5)
Eosinophils Relative: 2 %
HCT: 33.6 % — ABNORMAL LOW (ref 39.0–52.0)
Hemoglobin: 11.1 g/dL — ABNORMAL LOW (ref 13.0–17.0)
Immature Granulocytes: 0 %
Lymphocytes Relative: 30 %
Lymphs Abs: 1.5 10*3/uL (ref 0.7–4.0)
MCH: 30.4 pg (ref 26.0–34.0)
MCHC: 33 g/dL (ref 30.0–36.0)
MCV: 92.1 fL (ref 80.0–100.0)
MONO ABS: 0.5 10*3/uL (ref 0.1–1.0)
Monocytes Relative: 9 %
Neutro Abs: 2.9 10*3/uL (ref 1.7–7.7)
Neutrophils Relative %: 58 %
Platelet Count: 158 10*3/uL (ref 150–400)
RBC: 3.65 MIL/uL — ABNORMAL LOW (ref 4.22–5.81)
RDW: 13.7 % (ref 11.5–15.5)
WBC Count: 4.9 10*3/uL (ref 4.0–10.5)
nRBC: 0 % (ref 0.0–0.2)

## 2018-09-01 LAB — CMP (CANCER CENTER ONLY)
ALT: 10 U/L (ref 0–44)
AST: 16 U/L (ref 15–41)
Albumin: 4.1 g/dL (ref 3.5–5.0)
Alkaline Phosphatase: 18 U/L — ABNORMAL LOW (ref 38–126)
Anion gap: 9 (ref 5–15)
BUN: 11 mg/dL (ref 8–23)
CO2: 27 mmol/L (ref 22–32)
Calcium: 9.1 mg/dL (ref 8.9–10.3)
Chloride: 102 mmol/L (ref 98–111)
Creatinine: 1.08 mg/dL (ref 0.61–1.24)
GFR, Est AFR Am: >60 mL/min (ref >60)
GFR, Estimated: >60 mL/min (ref >60)
Glucose, Bld: 79 mg/dL (ref 70–99)
Potassium: 4.5 mmol/L (ref 3.5–5.1)
Sodium: 138 mmol/L (ref 135–145)
Total Bilirubin: 0.3 mg/dL (ref 0.3–1.2)
Total Protein: 6.7 g/dL (ref 6.5–8.1)

## 2018-09-01 MED ORDER — LEUPROLIDE ACETATE (3 MONTH) 22.5 MG IM KIT
22.5000 mg | PACK | Freq: Once | INTRAMUSCULAR | Status: AC
  Administered 2018-09-01: 22.5 mg via INTRAMUSCULAR

## 2018-09-01 MED ORDER — LEUPROLIDE ACETATE (3 MONTH) 22.5 MG IM KIT
PACK | INTRAMUSCULAR | Status: AC
  Filled 2018-09-01: qty 22.5

## 2018-09-01 NOTE — Patient Instructions (Signed)

## 2018-09-01 NOTE — Telephone Encounter (Signed)
Scheduled appt per 3/20 los.  Printed calendar.

## 2018-09-01 NOTE — Progress Notes (Signed)
Hematology and Oncology Follow Up Visit  Tami Barren [id number] January 11, 1941 78 y.o. 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  He was initially diagnosed in year 2000.Marland Kitchen  Prior Therapy: 1. He received radiation therapy and 2 years of Lupron for locally advanced prostate cancer at the time of diagnosis in 2000.  2.  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  He was started on Lupron at that time.  3. He is S/P radiation therapy directed to the right hip (30 gray in 12 fractions) concluded in 07/2011 for palliative purposes.    Current therapy:  1. Lupron 22.5 mg every three months.   2. Delton See given every 3 months started in 10/2011.  Therapy discontinued in June 2019 based on patient's preference.  Interim History:  Mr. Kardell returns today for a repeat evaluation.  Since the last visit, he reports no major complaints or hospitalizations.  He continues to tolerate Lupron without any issues.  He does report some hot flashes at times but those are manageable.  He denies any excessive fatigue, tiredness or bone pain.  Patient denied any alteration mental status, neuropathy, confusion or dizziness.  Denies any headaches or lethargy.  Denies any night sweats, weight loss or changes in appetite.  Denied orthopnea, dyspnea on exertion or chest discomfort.  Denies shortness of breath, difficulty breathing hemoptysis or cough.  Denies any abdominal distention, nausea, early satiety or dyspepsia.  Denies any hematuria, frequency, dysuria or nocturia.  Denies any skin irritation, dryness or rash.  Denies any ecchymosis or petechiae.  Denies any lymphadenopathy or clotting.  Denies any heat or cold intolerance.  Denies any anxiety or depression.  Remaining review of system is negative.      Medications: I have reviewed the patient's current  medications.   Current Outpatient Medications  Medication Sig Dispense Refill  . aspirin EC 81 MG EC tablet Take 1 tablet (81 mg total) by mouth daily. 30 tablet 11  . Calcium Carbonate-Vitamin D (CALCIUM-VITAMIN D) 500-200 MG-UNIT per tablet Take 1 tablet by mouth daily.    . clonazePAM (KLONOPIN) 1 MG tablet Take 1 mg by mouth at bedtime.     . clopidogrel (PLAVIX) 75 MG tablet TAKE ONE TABLET BY MOUTH DAILY 90 tablet 3  . isosorbide mononitrate (IMDUR) 30 MG 24 hr tablet Take 1 tablet (30 mg total) by mouth daily. 90 tablet 3  . leuprolide (LUPRON) 22.5 MG injection Inject 22.5 mg into the muscle every 3 (three) months.    . levothyroxine (SYNTHROID, LEVOTHROID) 50 MCG tablet Take 1 tablet by mouth daily.    . nitroGLYCERIN (NITROSTAT) 0.4 MG SL tablet Place 1 tablet (0.4 mg total) under the tongue every 5 (five) minutes as needed for chest pain. 25 tablet 3  . pantoprazole (PROTONIX) 40 MG tablet Take 40 mg by mouth daily.    . polyethylene glycol (MIRALAX / GLYCOLAX) packet Take 17 g by mouth daily.    . simvastatin (ZOCOR) 40 MG tablet Take 40 mg by mouth at bedtime.      Marland Kitchen venlafaxine XR (EFFEXOR-XR) 150 MG 24 hr capsule Take 150 mg by mouth daily.    . vitamin B-12 (CYANOCOBALAMIN) 1000 MCG tablet Take 1,000 mcg by mouth daily.       No current facility-administered medications for this visit.    Facility-Administered Medications  Ordered in Other Visits  Medication Dose Route Frequency Provider Last Rate Last Dose  . leuprolide (LUPRON) injection 22.5 mg  22.5 mg Intramuscular Once Wyatt Portela, MD         Allergies:  Allergies  Allergen Reactions  . Penicillins Other (See Comments)    Passed out  . Phenazopyridine Hcl Hives  . Ramipril Hives    Past Medical History, Surgical history, Social history, and Family History remain unchanged.   Physical Exam:  Blood pressure 112/66, pulse (!) 101, temperature 98 F (36.7 C), temperature source Oral, resp. rate 18, height  5' 10.5" (1.791 m), weight 135 lb 14.4 oz (61.6 kg), SpO2 98 %.    ECOG: 1    General appearance: Alert, awake without any distress. Head: Atraumatic without abnormalities Oropharynx: Without any thrush or ulcers. Eyes: No scleral icterus. Lymph nodes: No lymphadenopathy noted in the cervical, supraclavicular, or axillary nodes Heart:regular rate and rhythm, without any murmurs or gallops.   Lung: Clear to auscultation without any rhonchi, wheezes or dullness to percussion. Abdomin: Soft, nontender without any shifting dullness or ascites. Musculoskeletal: No clubbing or cyanosis. Neurological: No motor or sensory deficits. Skin: No rashes or lesions. Psychiatric: Mood and affect appeared normal.     Lab Results: Lab Results  Component Value Date   WBC 4.9 09/01/2018   HGB 11.1 (L) 09/01/2018   HCT 33.6 (L) 09/01/2018   MCV 92.1 09/01/2018   PLT 158 09/01/2018      Results for CHANE, COWDEN SR. (MRN [id number]) as of 09/01/2018 11:40  Ref. Range 02/24/2018 09:02 06/02/2018 10:18  Prostate Specific Ag, Serum Latest Ref Range: 0.0 - 4.0 ng/mL 0.1 0.2      Impression and Plan:   78 year old man with:   1.  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    His PSA continues to be under control without any evidence to suggest disease progression.  Risks and benefits of continuing androgen deprivation long-term was reviewed.  Additional treatment for his prostate cancer was also discussed which include Zytiga, Darron Doom as well as systemic chemotherapy.  Plan is to defer these options unless he developed castration resistant disease.   2. Bone pain: No issues reported since the last visit.  3. Bone directed therapy: He opted to stop Xgeva because of poor tolerance.  This will be restarted in the future potentially if he develops worsening metastatic disease.  4. Androgen depravation: He is agreeable to continue  this therapy indefinitely.  Complications such as osteoporosis as well as weight gain and hot flashes were also reiterated.    5. Follow up: We will be in 3 months for repeat evaluation.  15  minutes was spent with the patient face-to-face today.  More than 50% of time was spent on reviewing his disease status, treatment options and answering questions regarding future plan of care.   Zola Button, MD 3/20/202011:48 AM

## 2018-09-02 LAB — TESTOSTERONE: Testosterone: <3 ng/dL — ABNORMAL LOW (ref 264–916)

## 2018-09-02 LAB — PROSTATE-SPECIFIC AG, SERUM (LABCORP): PROSTATE SPECIFIC AG, SERUM: 0.2 ng/mL (ref 0.0–4.0)

## 2018-09-04 ENCOUNTER — Telehealth: Payer: Self-pay

## 2018-09-04 NOTE — Telephone Encounter (Signed)
Contacted patient and made aware of PSA results. 

## 2018-09-04 NOTE — Telephone Encounter (Signed)
-----   Message from Wyatt Portela, MD sent at 09/04/2018  8:15 AM EDT ----- Please let him know his PSA is still low.

## 2018-09-07 ENCOUNTER — Encounter: Payer: Self-pay | Admitting: Cardiology

## 2018-09-07 ENCOUNTER — Telehealth: Payer: Self-pay | Admitting: *Deleted

## 2018-09-07 NOTE — Telephone Encounter (Signed)
   Primary Cardiologist:  Rozann Lesches, MD   Patient contacted.  History reviewed.  No symptoms to suggest any unstable cardiac conditions.  Based on discussion, with current pandemic situation, we will be postponing this appointment for Russell Flattery Sr. with a plan to f/u 11/27/2018.    If symptoms change, he has been instructed to contact our office.      Marland Kitchen

## 2018-09-07 NOTE — Progress Notes (Signed)
Remote pacemaker transmission.   

## 2018-09-12 ENCOUNTER — Other Ambulatory Visit: Payer: Self-pay | Admitting: Cardiology

## 2018-09-13 ENCOUNTER — Ambulatory Visit: Payer: Medicare Other | Admitting: Cardiology

## 2018-10-19 ENCOUNTER — Other Ambulatory Visit: Payer: Self-pay | Admitting: Cardiology

## 2018-10-26 DIAGNOSIS — K210 Gastro-esophageal reflux disease with esophagitis: Secondary | ICD-10-CM | POA: Diagnosis not present

## 2018-10-26 DIAGNOSIS — E039 Hypothyroidism, unspecified: Secondary | ICD-10-CM | POA: Diagnosis not present

## 2018-10-26 DIAGNOSIS — E782 Mixed hyperlipidemia: Secondary | ICD-10-CM | POA: Diagnosis not present

## 2018-10-26 DIAGNOSIS — D630 Anemia in neoplastic disease: Secondary | ICD-10-CM | POA: Diagnosis not present

## 2018-10-26 DIAGNOSIS — I10 Essential (primary) hypertension: Secondary | ICD-10-CM | POA: Diagnosis not present

## 2018-10-26 DIAGNOSIS — Z79891 Long term (current) use of opiate analgesic: Secondary | ICD-10-CM | POA: Diagnosis not present

## 2018-10-31 DIAGNOSIS — C61 Malignant neoplasm of prostate: Secondary | ICD-10-CM | POA: Diagnosis not present

## 2018-10-31 DIAGNOSIS — Z6820 Body mass index (BMI) 20.0-20.9, adult: Secondary | ICD-10-CM | POA: Diagnosis not present

## 2018-10-31 DIAGNOSIS — I25119 Atherosclerotic heart disease of native coronary artery with unspecified angina pectoris: Secondary | ICD-10-CM | POA: Diagnosis not present

## 2018-10-31 DIAGNOSIS — F331 Major depressive disorder, recurrent, moderate: Secondary | ICD-10-CM | POA: Diagnosis not present

## 2018-10-31 DIAGNOSIS — E782 Mixed hyperlipidemia: Secondary | ICD-10-CM | POA: Diagnosis not present

## 2018-10-31 DIAGNOSIS — D630 Anemia in neoplastic disease: Secondary | ICD-10-CM | POA: Diagnosis not present

## 2018-10-31 DIAGNOSIS — D692 Other nonthrombocytopenic purpura: Secondary | ICD-10-CM | POA: Diagnosis not present

## 2018-10-31 DIAGNOSIS — I10 Essential (primary) hypertension: Secondary | ICD-10-CM | POA: Diagnosis not present

## 2018-11-27 ENCOUNTER — Encounter: Payer: Self-pay | Admitting: Cardiology

## 2018-11-27 ENCOUNTER — Encounter: Payer: Self-pay | Admitting: *Deleted

## 2018-11-27 ENCOUNTER — Other Ambulatory Visit: Payer: Self-pay

## 2018-11-27 ENCOUNTER — Ambulatory Visit (INDEPENDENT_AMBULATORY_CARE_PROVIDER_SITE_OTHER): Payer: Medicare Other | Admitting: Cardiology

## 2018-11-27 VITALS — BP 107/61 | HR 71 | Temp 97.0°F | Ht 70.0 in | Wt 139.8 lb

## 2018-11-27 DIAGNOSIS — I25119 Atherosclerotic heart disease of native coronary artery with unspecified angina pectoris: Secondary | ICD-10-CM | POA: Diagnosis not present

## 2018-11-27 DIAGNOSIS — E782 Mixed hyperlipidemia: Secondary | ICD-10-CM

## 2018-11-27 DIAGNOSIS — R42 Dizziness and giddiness: Secondary | ICD-10-CM | POA: Diagnosis not present

## 2018-11-27 DIAGNOSIS — R55 Syncope and collapse: Secondary | ICD-10-CM | POA: Diagnosis not present

## 2018-11-27 DIAGNOSIS — Z950 Presence of cardiac pacemaker: Secondary | ICD-10-CM | POA: Diagnosis not present

## 2018-11-27 NOTE — Progress Notes (Signed)
Cardiology Office Note  Date: 11/27/2018   ID: Russell Flattery Sr., DOB [birth year], MRN [id number]  PCP:  Russell Bis, MD  Cardiologist:  Russell Lesches, MD Electrophysiologist:  Russell Grayer, MD   Chief Complaint  Patient presents with  . Coronary Artery Disease    History of Present Illness: Russell Browning. is a 78 y.o. male last seen in October 2019.  He presents for routine visit.  States that overall he has done well, no angina symptoms or nitroglycerin use, still active with chores around the house.  Yesterday, he experienced an episode of orthostatic dizziness after church.  Went home and took a nap.  He got up at nighttime and fell after standing and bruised his arm.  He did not feel any chest pain or palpitations at that time, and states that he feels completely back to normal today.  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  Blood pressure is low normal today.  I personally reviewed his ECG which shows normal sinus rhythm with increased voltage.  He sees Dr. [name] in the device clinic, Medtronic pacemaker in place.  He is due to have a pacemaker interrogation tomorrow.  He had lab work done with Dr. Quillian Browning about a month ago, we are requesting the results to review.   Past Medical History:  Diagnosis Date  . Anemia   . CAD (coronary artery disease), native coronary artery    a. DES to proximal, mid, and distal RCA 2006. b. 12/2014 - [place] s/p  PTCA to the mid-distal previously placed RCA stents, otherwise nonobstructive CAD for continued medical therapy.  Marland Kitchen History of TIAs   . Mixed hyperlipidemia   . Pacemaker lead failure    RA lead insulation defect noted at Generator change by Dr Russell Comes 2008, impedance now low, but lead functioning otherwise normally  . Prostate cancer metastatic to bone ([place])   . Sinus node dysfunction [facility])    Medtronic pacemaker - Dr. [name]    Past Surgical History:  Procedure Laterality Date  . CARDIAC CATHETERIZATION  N/A 01/06/2015   Procedure: Left Heart Cath and Coronary Angiography;  Surgeon: Troy Sine, MD;  Location: [facility];  Service: Cardiovascular;  Laterality: N/A;  . PACEMAKER INSERTION     MDT    Current Outpatient Medications  Medication Sig Dispense Refill  . aspirin EC 81 MG EC tablet Take 1 tablet (81 mg total) by mouth daily. 30 tablet 11  . Calcium Carbonate-Vitamin D (CALCIUM-VITAMIN D) 500-200 MG-UNIT per tablet Take 1 tablet by mouth daily.    . clonazePAM (KLONOPIN) 1 MG tablet Take 2 mg by mouth at bedtime.     . clopidogrel (PLAVIX) 75 MG tablet TAKE ONE TABLET BY MOUTH DAILY 90 tablet 2  . isosorbide mononitrate (IMDUR) 30 MG 24 hr tablet TAKE ONE TABLET BY MOUTH DAILY 90 tablet 2  . leuprolide (LUPRON) 22.5 MG injection Inject 22.5 mg into the muscle every 3 (three) months.    . levothyroxine (SYNTHROID, LEVOTHROID) 50 MCG tablet Take 1 tablet by mouth daily.    . nitroGLYCERIN (NITROSTAT) 0.4 MG SL tablet Place 1 tablet (0.4 mg total) under the tongue every 5 (five) minutes as needed for chest pain. 25 tablet 3  . pantoprazole (PROTONIX) 40 MG tablet Take 40 mg by mouth daily.    . polyethylene glycol (MIRALAX / GLYCOLAX) packet Take 17 g by mouth daily.    . simvastatin (ZOCOR) 40  MG tablet Take 40 mg by mouth at bedtime.      Marland Kitchen venlafaxine XR (EFFEXOR-XR) 150 MG 24 hr capsule Take 150 mg by mouth daily.    . vitamin B-12 (CYANOCOBALAMIN) 1000 MCG tablet Take 1,000 mcg by mouth daily.       No current facility-administered medications for this visit.    Allergies:  Penicillins, Phenazopyridine hcl, and Ramipril   Social History: The patient  reports that he quit smoking about 53 years ago. His smoking use included cigarettes. He started smoking about 67 years ago. He has a 5.00 pack-year smoking history. He quit smokeless tobacco use about 40 years ago.  His smokeless tobacco use included chew. He reports that he does not drink alcohol or use drugs.   ROS:   Please see the history of present illness. Otherwise, complete review of systems is positive for none.  All other systems are reviewed and negative.   Physical Exam: VS:  BP 107/61   Pulse 71   Temp (!) 97 F (36.1 C)   Ht 5\' 10"  (1.778 m)   Wt 139 lb 12.8 oz (63.4 kg)   SpO2 99%   BMI 20.06 kg/m , BMI Body mass index is 20.06 kg/m.  Wt Readings from Last 3 Encounters:  11/27/18 139 lb 12.8 oz (63.4 kg)  09/01/18 135 lb 14.4 oz (61.6 kg)  06/02/18 134 lb 4.8 oz (60.9 kg)    General: Elderly male, appears comfortable at rest. HEENT: Conjunctiva and lids normal, oropharynx clear. Neck: Supple, no elevated JVP or carotid bruits, no thyromegaly. Lungs: Clear to auscultation, nonlabored breathing at rest. Cardiac: Regular rate and rhythm, no S3 or significant systolic murmur. Abdomen: Soft, nontender, bowel sounds present. Extremities: No pitting edema, distal pulses 2 +. Skin: Warm and dry. Musculoskeletal: No kyphosis. Neuropsychiatric: Alert and oriented x3, affect grossly appropriate.  ECG:  An ECG dated 06/15/2017 was personally reviewed today and demonstrated:  Sinus rhythm.  Recent Labwork: 09/01/2018: ALT 10; AST 16; BUN 11; Creatinine 1.08; Hemoglobin 11.1; Platelet Count 158; Potassium 4.5; Sodium 138   Other Studies Reviewed Today:  Lexiscan Myoview 06/21/2017:  No diagnostic ST segment changes to indicate ischemia.  Small, mild intensity, mid to apical inferoseptal defect is partially reversible. Suggestive of a mild ischemic territory.  This is a low risk study.  Nuclear stress EF: 56%.  Assessment and Plan:  1.  Status post previous RCA interventions as detailed above.  He does not report any angina or nitroglycerin use at this time.  We are managing him medically, follow-up Myoview from last year showed a mild ischemic territory in the mid to apical inferoseptal wall.  ECG reviewed today and stable.  2.  Episode of orthostatic lightheadedness as described  above.  He does not report any obvious palpitations or chest pain at the time.  Blood pressure low normal today.  He is not on any antihypertensive medications.  We discussed safety with position changes, maintaining adequate hydration.  His ECG shows normal sinus rhythm.  He has a pacemaker interrogation pending for tomorrow.  3.  Previous history of TIA.  He is on aspirin, Plavix, and statin therapy.  If he starts to have recurring episodes of lightheadedness or difficulty ambulating, he should seek medical attention for follow-up neuroimaging.  4.  Sinus node dysfunction, Medtronic pacemaker in place with follow-up by Dr. [name].  5.  Mixed hyperlipidemia, continues on Zocor.  Requesting interval lab work from Dr. Quillian Browning.  Medication Adjustments/Labs and Tests Ordered:  Current medicines are reviewed at length with the patient today.  Concerns regarding medicines are outlined above.   Tests Ordered: Orders Placed This Encounter  Procedures  . EKG 12-Lead    Medication Changes: No orders of the defined types were placed in this encounter.   Disposition:  Follow up 6 months in the [place] office.  Signed, Satira Sark, MD, [facility] 11/27/2018 3:44 PM    [facility], [place], [place] [address] Phone: [phone]; Fax: [phone]

## 2018-11-27 NOTE — Patient Instructions (Signed)

## 2018-11-29 ENCOUNTER — Ambulatory Visit (INDEPENDENT_AMBULATORY_CARE_PROVIDER_SITE_OTHER): Payer: Medicare Other | Admitting: *Deleted

## 2018-11-29 DIAGNOSIS — I495 Sick sinus syndrome: Secondary | ICD-10-CM

## 2018-11-30 ENCOUNTER — Inpatient Hospital Stay: Payer: Medicare Other | Attending: Oncology

## 2018-11-30 ENCOUNTER — Inpatient Hospital Stay: Payer: Medicare Other

## 2018-11-30 ENCOUNTER — Other Ambulatory Visit: Payer: Self-pay

## 2018-11-30 ENCOUNTER — Inpatient Hospital Stay (HOSPITAL_BASED_OUTPATIENT_CLINIC_OR_DEPARTMENT_OTHER): Payer: Medicare Other | Admitting: Oncology

## 2018-11-30 VITALS — BP 115/71 | HR 91 | Temp 98.0°F | Resp 18 | Ht 70.0 in | Wt 138.2 lb

## 2018-11-30 DIAGNOSIS — C7951 Secondary malignant neoplasm of bone: Secondary | ICD-10-CM

## 2018-11-30 DIAGNOSIS — C61 Malignant neoplasm of prostate: Secondary | ICD-10-CM

## 2018-11-30 DIAGNOSIS — E291 Testicular hypofunction: Secondary | ICD-10-CM | POA: Diagnosis not present

## 2018-11-30 DIAGNOSIS — Z5111 Encounter for antineoplastic chemotherapy: Secondary | ICD-10-CM | POA: Diagnosis not present

## 2018-11-30 DIAGNOSIS — R232 Flushing: Secondary | ICD-10-CM | POA: Diagnosis not present

## 2018-11-30 LAB — CMP (CANCER CENTER ONLY)
ALT: 11 U/L (ref 0–44)
AST: 18 U/L (ref 15–41)
Albumin: 4.2 g/dL (ref 3.5–5.0)
Alkaline Phosphatase: 28 U/L — ABNORMAL LOW (ref 38–126)
Anion gap: 8 (ref 5–15)
BUN: 16 mg/dL (ref 8–23)
CO2: 30 mmol/L (ref 22–32)
Calcium: 9.9 mg/dL (ref 8.9–10.3)
Chloride: 101 mmol/L (ref 98–111)
Creatinine: 1.32 mg/dL — ABNORMAL HIGH (ref 0.61–1.24)
GFR, Est AFR Am: 60 mL/min — ABNORMAL LOW (ref >60)
GFR, Estimated: 52 mL/min — ABNORMAL LOW (ref >60)
Glucose, Bld: 81 mg/dL (ref 70–99)
Potassium: 4.2 mmol/L (ref 3.5–5.1)
Sodium: 139 mmol/L (ref 135–145)
Total Bilirubin: 0.3 mg/dL (ref 0.3–1.2)
Total Protein: 6.8 g/dL (ref 6.5–8.1)

## 2018-11-30 LAB — CUP PACEART REMOTE DEVICE CHECK
Battery Impedance: 3058 Ohm
Battery Remaining Longevity: 18 mo
Battery Voltage: 2.73 V
Brady Statistic AP VP Percent: 0 %
Brady Statistic AP VS Percent: 50 %
Brady Statistic AS VP Percent: 0 %
Brady Statistic AS VS Percent: 50 %
Date Time Interrogation Session: 20200617174856
Implantable Lead Implant Date: 19990408
Implantable Lead Implant Date: 19990408
Implantable Lead Location: 753859
Implantable Lead Location: 753860
Implantable Lead Model: 4035
Implantable Lead Model: 4524
Implantable Lead Serial Number: 208202
Implantable Pulse Generator Implant Date: 20081126
Lead Channel Impedance Value: 1181 Ohm
Lead Channel Impedance Value: 127 Ohm
Lead Channel Pacing Threshold Amplitude: 2.25 V
Lead Channel Pacing Threshold Pulse Width: 0.4 ms
Lead Channel Sensing Intrinsic Amplitude: 0.7 mV
Lead Channel Sensing Intrinsic Amplitude: 8 mV
Lead Channel Setting Pacing Amplitude: 2 V
Lead Channel Setting Pacing Amplitude: 2.5 V
Lead Channel Setting Pacing Pulse Width: 0.76 ms
Lead Channel Setting Sensing Sensitivity: 5.6 mV

## 2018-11-30 LAB — CBC WITH DIFFERENTIAL (CANCER CENTER ONLY)
Abs Immature Granulocytes: 0.01 10*3/uL (ref 0.00–0.07)
Basophils Absolute: 0 10*3/uL (ref 0.0–0.1)
Basophils Relative: 1 %
Eosinophils Absolute: 0.2 10*3/uL (ref 0.0–0.5)
Eosinophils Relative: 4 %
HCT: 34.5 % — ABNORMAL LOW (ref 39.0–52.0)
Hemoglobin: 10.9 g/dL — ABNORMAL LOW (ref 13.0–17.0)
Immature Granulocytes: 0 %
Lymphocytes Relative: 29 %
Lymphs Abs: 1.2 10*3/uL (ref 0.7–4.0)
MCH: 29.6 pg (ref 26.0–34.0)
MCHC: 31.6 g/dL (ref 30.0–36.0)
MCV: 93.8 fL (ref 80.0–100.0)
Monocytes Absolute: 0.4 10*3/uL (ref 0.1–1.0)
Monocytes Relative: 10 %
Neutro Abs: 2.5 10*3/uL (ref 1.7–7.7)
Neutrophils Relative %: 56 %
Platelet Count: 157 10*3/uL (ref 150–400)
RBC: 3.68 MIL/uL — ABNORMAL LOW (ref 4.22–5.81)
RDW: 13.4 % (ref 11.5–15.5)
WBC Count: 4.3 10*3/uL (ref 4.0–10.5)
nRBC: 0 % (ref 0.0–0.2)

## 2018-11-30 MED ORDER — LEUPROLIDE ACETATE (3 MONTH) 22.5 MG IM KIT
22.5000 mg | PACK | Freq: Once | INTRAMUSCULAR | Status: AC
  Administered 2018-11-30: 22.5 mg via INTRAMUSCULAR

## 2018-11-30 MED ORDER — LEUPROLIDE ACETATE (3 MONTH) 22.5 MG IM KIT
PACK | INTRAMUSCULAR | Status: AC
  Filled 2018-11-30: qty 22.5

## 2018-11-30 NOTE — Progress Notes (Signed)
Hematology and Oncology Follow Up Visit  Russell Browning [id number] [birth year] 78 y.o. 11/30/2018 9:56 AM    Principle Diagnosis:  78 year old man with advanced prostate cancer with disease to the bone that is currently castration-sensitive diagnosed in 2013.   Prior Therapy: 1. He received radiation therapy and 2 years of Lupron for locally advanced prostate cancer at the time of diagnosis in 2000.  2.  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  He was started on Lupron at that time.  3. He is S/P radiation therapy directed to the right hip (30 gray in 12 fractions) concluded in 07/2011 for palliative purposes.    Current therapy:  1. Lupron 22.5 mg every three months.   2. Delton See given every 3 months started in 10/2011.  Therapy discontinued in June 2019 based on patient's preference.  Interim History:  Russell Browning is here for a follow-up visit.  Since the last visit, he reports no major changes in his health.  He remains active and continues to attend activities of daily living.  He is able to drive short distances although his family drove him today.  He denies any recent falls or syncope.  He denies any recent hospitalizations or illnesses.  He denies any issues related to Lupron at this time.  He does report some hot flashes that are been manageable.  He denied headaches, blurry vision, syncope or seizures.  Denies any fevers, chills or sweats.  Denied chest pain, palpitation, orthopnea or leg edema.  Denied cough, wheezing or hemoptysis.  Denied nausea, vomiting or abdominal pain.  Denies any constipation or diarrhea.  Denies any frequency urgency or hesitancy.  Denies any arthralgias or myalgias.  Denies any skin rashes or lesions.  Denies any bleeding or clotting tendency.  Denies any easy bruising.  Denies any hair or nail changes.  Denies any anxiety or depression.  Remaining review of system is  negative.         Medications: I have reviewed the patient's current medications.   Current Outpatient Medications  Medication Sig Dispense Refill  . aspirin EC 81 MG EC tablet Take 1 tablet (81 mg total) by mouth daily. 30 tablet 11  . Calcium Carbonate-Vitamin D (CALCIUM-VITAMIN D) 500-200 MG-UNIT per tablet Take 1 tablet by mouth daily.    . clonazePAM (KLONOPIN) 1 MG tablet Take 2 mg by mouth at bedtime.     . clopidogrel (PLAVIX) 75 MG tablet TAKE ONE TABLET BY MOUTH DAILY 90 tablet 2  . isosorbide mononitrate (IMDUR) 30 MG 24 hr tablet TAKE ONE TABLET BY MOUTH DAILY 90 tablet 2  . leuprolide (LUPRON) 22.5 MG injection Inject 22.5 mg into the muscle every 3 (three) months.    . levothyroxine (SYNTHROID, LEVOTHROID) 50 MCG tablet Take 1 tablet by mouth daily.    . nitroGLYCERIN (NITROSTAT) 0.4 MG SL tablet Place 1 tablet (0.4 mg total) under the tongue every 5 (five) minutes as needed for chest pain. 25 tablet 3  . pantoprazole (PROTONIX) 40 MG tablet Take 40 mg by mouth daily.    . polyethylene glycol (MIRALAX / GLYCOLAX) packet Take 17 g by mouth daily.    . simvastatin (ZOCOR) 40 MG tablet Take 40 mg by mouth at bedtime.      Marland Kitchen venlafaxine XR (EFFEXOR-XR) 150 MG 24 hr capsule Take 150 mg by mouth daily.    . vitamin B-12 (CYANOCOBALAMIN) 1000 MCG tablet Take 1,000 mcg  by mouth daily.       No current facility-administered medications for this visit.    Facility-Administered Medications Ordered in Other Visits  Medication Dose Route Frequency Provider Last Rate Last Dose  . leuprolide (LUPRON) injection 22.5 mg  22.5 mg Intramuscular Once Wyatt Portela, MD         Allergies:  Allergies  Allergen Reactions  . Penicillins Other (See Comments)    Passed out  . Phenazopyridine Hcl Hives  . Ramipril Hives    Past Medical History, Surgical history, Social history, and Family History remain unchanged.   Physical Exam:  Blood pressure 115/71, pulse 91, temperature 98  F (36.7 C), temperature source Oral, resp. rate 18, height 5\' 10"  (1.778 m), weight 138 lb 3.2 oz (62.7 kg), SpO2 100 %.    ECOG: 1     General appearance: Comfortable appearing without any discomfort Head: Normocephalic without any trauma Oropharynx: Mucous membranes are moist and pink without any thrush or ulcers. Eyes: Pupils are equal and round reactive to light. Lymph nodes: No cervical, supraclavicular, inguinal or axillary lymphadenopathy.   Heart:regular rate and rhythm.  S1 and S2 without leg edema. Lung: Clear without any rhonchi or wheezes.  No dullness to percussion. Abdomin: Soft, nontender, nondistended with good bowel sounds.  No hepatosplenomegaly. Musculoskeletal: No joint deformity or effusion.  Full range of motion noted. Neurological: No deficits noted on motor, sensory and deep tendon reflex exam. Skin: No petechial rash or dryness.  Appeared moist.       Lab Results: Lab Results  Component Value Date   WBC 4.3 11/30/2018   HGB 10.9 (L) 11/30/2018   HCT 34.5 (L) 11/30/2018   MCV 93.8 11/30/2018   PLT 157 11/30/2018       Results for NELLO, CORRO SR. (MRN [id number]) as of 11/30/2018 09:47  Ref. Range 06/02/2018 10:18 09/01/2018 11:15  Prostate Specific Ag, Serum Latest Ref Range: 0.0 - 4.0 ng/mL 0.2 0.2      Impression and Plan:   78 year old man with:   1.  Advanced prostate cancer with disease to the bone that is currently castration-sensitive noted in 2013.     He continues to be on Lupron along without any need for any additional treatment.  His PSA continues to be low without any evidence of relapsed disease.  The natural course of this disease as well as risk of relapse was assessed.  Salvage therapies if he developed castration hyper resistant disease were reiterated.  These treatments and their complications were reiterated today and likely will include Zytiga or Xtandi.  Systemic chemotherapy would also be an option point if needed.   He understands these issues at this time agreeable to continue.  2. Bone pain: Very limited at this time continues to be under control.  3. Bone directed therapy: He declined resumption of Xgeva at this time.  Risks and benefits of this medication were reiterated continues to decline it.  4. Androgen depravation: Long-term complications associated with androgen deprivation were reviewed with clinic osteoporosis and cardiovascular issues were reiterated.  Prescription continue.  5. Follow up: In 3 months for repeat Lupron and repeat evaluation.  25  minutes was spent with the patient face-to-face today.  More than 50% of time was dedicated to reviewing the natural course of his disease, reiterating his treatment options, complications related therapy as well as indication for these therapies.   Zola Button, MD 6/18/20209:56 AM

## 2018-12-01 ENCOUNTER — Telehealth: Payer: Self-pay

## 2018-12-01 LAB — PROSTATE-SPECIFIC AG, SERUM (LABCORP): Prostate Specific Ag, Serum: 0.2 ng/mL (ref 0.0–4.0)

## 2018-12-01 LAB — TESTOSTERONE: Testosterone: <3 ng/dL — ABNORMAL LOW (ref 264–916)

## 2018-12-01 NOTE — Telephone Encounter (Signed)
Contacted patient, spoke with spouse, and made aware of PSA results. She stated that she would communicate to the patient.

## 2018-12-01 NOTE — Telephone Encounter (Signed)
-----   Message from Wyatt Portela, MD sent at 12/01/2018  8:07 AM EDT ----- Please let him know his PSA is still down.

## 2018-12-05 ENCOUNTER — Telehealth: Payer: Self-pay | Admitting: Oncology

## 2018-12-05 NOTE — Telephone Encounter (Signed)
Called and spoke with wife. Confirmed 9/18 appt

## 2018-12-12 DIAGNOSIS — E039 Hypothyroidism, unspecified: Secondary | ICD-10-CM | POA: Diagnosis not present

## 2018-12-12 DIAGNOSIS — I10 Essential (primary) hypertension: Secondary | ICD-10-CM | POA: Diagnosis not present

## 2018-12-13 ENCOUNTER — Encounter: Payer: Self-pay | Admitting: Cardiology

## 2018-12-13 NOTE — Progress Notes (Signed)
Letter  

## 2018-12-13 NOTE — Progress Notes (Signed)
Remote pacemaker transmission.   

## 2019-02-15 ENCOUNTER — Telehealth: Payer: Self-pay | Admitting: Internal Medicine

## 2019-02-15 NOTE — Telephone Encounter (Signed)
Virtual Visit Pre-Appointment Phone Call  "(Name), I am calling you today to discuss your upcoming appointment. We are currently trying to limit exposure to the virus that causes COVID-19 by seeing patients at home rather than in the office."  1. "What is the BEST phone number to call the day of the visit?" - include this in appointment notes  2. Do you have or have access to (through a family member/friend) a smartphone with video capability that we can use for your visit?" a. If yes - list this number in appt notes as cell (if different from BEST phone #) and list the appointment type as a VIDEO visit in appointment notes b. If no - list the appointment type as a PHONE visit in appointment notes  3. Confirm consent - "In the setting of the current Covid19 crisis, you are scheduled for a (phone or video) visit with your provider on (date) at (time).  Just as we do with many in-office visits, in order for you to participate in this visit, we must obtain consent.  If you'd like, I can send this to your mychart (if signed up) or email for you to review.  Otherwise, I can obtain your verbal consent now.  All virtual visits are billed to your insurance company just like a normal visit would be.  By agreeing to a virtual visit, we'd like you to understand that the technology does not allow for your provider to perform an examination, and thus may limit your provider's ability to fully assess your condition. If your provider identifies any concerns that need to be evaluated in person, we will make arrangements to do so.  Finally, though the technology is pretty good, we cannot assure that it will always work on either your or our end, and in the setting of a video visit, we may have to convert it to a phone-only visit.  In either situation, we cannot ensure that we have a secure connection.  Are you willing to proceed?" STAFF: Did the patient verbally acknowledge consent to telehealth visit? Document  YES/NO here: yes  4. Advise patient to be prepared - "Two hours prior to your appointment, go ahead and check your blood pressure, pulse, oxygen saturation, and your weight (if you have the equipment to check those) and write them all down. When your visit starts, your provider will ask you for this information. If you have an Apple Watch or Kardia device, please plan to have heart rate information ready on the day of your appointment. Please have a pen and paper handy nearby the day of the visit as well."  5. Give patient instructions for MyChart download to smartphone OR Doximity/Doxy.me as below if video visit (depending on what platform provider is using)  6. Inform patient they will receive a phone call 15 minutes prior to their appointment time (may be from unknown caller ID) so they should be prepared to answer    Russell Sr. has been deemed a candidate for a follow-up tele-health visit to limit community exposure during the Covid-19 pandemic. I spoke with the patient via phone to ensure availability of phone/video source, confirm preferred email & phone number, and discuss instructions and expectations.  I reminded Russell SABAN Sr. to be prepared with any vital sign and/or heart rhythm information that could potentially be obtained via home monitoring, at the time of his visit. I reminded Russell ROPER Sr. to expect a phone  call prior to his visit.  Russell Browning 02/15/2019 9:14 AM   INSTRUCTIONS FOR DOWNLOADING THE MYCHART APP TO SMARTPHONE  - The patient must first make sure to have activated MyChart and know their login information - If Apple, go to [employer] and type in MyChart in the search bar and download the app. If Android, ask patient to go to [employer] and type in [place] in the search bar and download the app. The app is free but as with any other app downloads, their phone may require them to verify saved payment information or  Apple/Android password.  - The patient will need to then log into the app with their MyChart username and password, and select [facility] as their healthcare provider to link the account. When it is time for your visit, go to the MyChart app, find appointments, and click Begin Video Visit. Be sure to Select Allow for your device to access the Microphone and Camera for your visit. You will then be connected, and your provider will be with you shortly.  **If they have any issues connecting, or need assistance please contact MyChart service desk [phone])**  **If using a computer, in order to ensure the best quality for their visit they will need to use either of the following Internet Browsers: [employer], or Google Chrome**  IF USING DOXIMITY or DOXY.ME - The patient will receive a link just prior to their visit by text.     FULL LENGTH CONSENT FOR TELE-HEALTH VISIT   I hereby voluntarily request, consent and authorize [facility] and its employed or contracted physicians, physician assistants, nurse practitioners or other licensed health care professionals (the Practitioner), to provide me with telemedicine health care services (the Services") as deemed necessary by the treating Practitioner. I acknowledge and consent to receive the Services by the Practitioner via telemedicine. I understand that the telemedicine visit will involve communicating with the Practitioner through live audiovisual communication technology and the disclosure of certain medical information by electronic transmission. I acknowledge that I have been given the opportunity to request an in-person assessment or other available alternative prior to the telemedicine visit and am voluntarily participating in the telemedicine visit.  I understand that I have the right to withhold or withdraw my consent to the use of telemedicine in the course of my care at any time, without affecting my right to future care  or treatment, and that the Practitioner or I may terminate the telemedicine visit at any time. I understand that I have the right to inspect all information obtained and/or recorded in the course of the telemedicine visit and may receive copies of available information for a reasonable fee.  I understand that some of the potential risks of receiving the Services via telemedicine include:   Delay or interruption in medical evaluation due to technological equipment failure or disruption;  Information transmitted may not be sufficient (e.g. poor resolution of images) to allow for appropriate medical decision making by the Practitioner; and/or   In rare instances, security protocols could fail, causing a breach of personal health information.  Furthermore, I acknowledge that it is my responsibility to provide information about my medical history, conditions and care that is complete and accurate to the best of my ability. I acknowledge that Practitioner's advice, recommendations, and/or decision may be based on factors not within their control, such as incomplete or inaccurate data provided by me or distortions of diagnostic images or specimens that may result from  electronic transmissions. I understand that the practice of medicine is not an exact science and that Practitioner makes no warranties or guarantees regarding treatment outcomes. I acknowledge that I will receive a copy of this consent concurrently upon execution via email to the email address I last provided but may also request a printed copy by calling the office of [facility].    I understand that my insurance will be billed for this visit.   I have read or had this consent read to me.  I understand the contents of this consent, which adequately explains the benefits and risks of the Services being provided via telemedicine.   I have been provided ample opportunity to ask questions regarding this consent and the Services and have had  my questions answered to my satisfaction.  I give my informed consent for the services to be provided through the use of telemedicine in my medical care  By participating in this telemedicine visit I agree to the above.

## 2019-02-16 ENCOUNTER — Telehealth (INDEPENDENT_AMBULATORY_CARE_PROVIDER_SITE_OTHER): Payer: Medicare Other | Admitting: Internal Medicine

## 2019-02-16 ENCOUNTER — Encounter: Payer: Self-pay | Admitting: Internal Medicine

## 2019-02-16 VITALS — BP 112/62 | HR 66 | Ht 70.0 in | Wt 142.2 lb

## 2019-02-16 DIAGNOSIS — I25119 Atherosclerotic heart disease of native coronary artery with unspecified angina pectoris: Secondary | ICD-10-CM

## 2019-02-16 DIAGNOSIS — I495 Sick sinus syndrome: Secondary | ICD-10-CM

## 2019-02-16 NOTE — Progress Notes (Signed)
Electrophysiology TeleHealth Note  Due to national recommendations of social distancing due to [place] 19, an audio telehealth visit is felt to be most appropriate for this patient at this time.  Verbal consent was obtained by me for the telehealth visit today.  The patient does not have capability for a virtual visit.  A phone visit is therefore required today.   Date:  02/16/2019   ID:  Russell Flattery Sr., DOB 11/02/1940, MRN [id number]  Location: patient's home  Provider location:  [facility]  Evaluation Performed: Follow-up visit  PCP:  Caryl Bis, MD   Electrophysiologist:  Dr Rayann Heman  Chief Complaint:  CAD  History of Present Illness:    Russell Latronica. is a 78 y.o. male who presents via telehealth conferencing today.  Since last being seen in our clinic, the patient reports doing very well.  He is very active.  Mowing his lawn.   Today, he denies symptoms of palpitations, chest pain, shortness of breath,  lower extremity edema, dizziness, presyncope, or syncope.  The patient is otherwise without complaint today.  The patient denies symptoms of fevers, chills, cough, or new SOB worrisome for COVID 19.  Past Medical History:  Diagnosis Date  . Anemia   . CAD (coronary artery disease), native coronary artery    a. DES to proximal, mid, and distal RCA 2006. b. 12/2014 - [place] s/p  PTCA to the mid-distal previously placed RCA stents, otherwise nonobstructive CAD for continued medical therapy.  Marland Kitchen History of TIAs   . Mixed hyperlipidemia   . 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 Prostate cancer metastatic to bone ([place])   . Sinus node dysfunction [facility])    [employer] pacemaker - Dr. Rayann Heman    Past Surgical History:  Procedure Laterality Date  . CARDIAC CATHETERIZATION N/A 01/06/2015   Procedure: Left Heart Cath and Coronary Angiography;  Surgeon: Troy Sine,  MD;  Location: [facility];  Service: Cardiovascular;  Laterality: N/A;  . PACEMAKER INSERTION     MDT    Current Outpatient Medications  Medication Sig Dispense Refill  . aspirin EC 81 MG EC tablet Take 1 tablet (81 mg total) by mouth daily. 30 tablet 11  . Calcium Carbonate-Vitamin D (CALCIUM-VITAMIN D) 500-200 MG-UNIT per tablet Take 1 tablet by mouth daily.    . clonazePAM (KLONOPIN) 1 MG tablet Take 2 mg by mouth at bedtime.     . clopidogrel (PLAVIX) 75 MG tablet TAKE ONE TABLET BY MOUTH DAILY 90 tablet 2  . isosorbide mononitrate (IMDUR) 30 MG 24 hr tablet TAKE ONE TABLET BY MOUTH DAILY 90 tablet 2  . leuprolide (LUPRON) 22.5 MG injection Inject 22.5 mg into the muscle every 3 (three) months.    . levothyroxine (SYNTHROID, LEVOTHROID) 50 MCG tablet Take 1 tablet by mouth daily.    . nitroGLYCERIN (NITROSTAT) 0.4 MG SL tablet Place 1 tablet (0.4 mg total) under the tongue every 5 (five) minutes as needed for chest pain. 25 tablet 3  . pantoprazole (PROTONIX) 40 MG tablet Take 40 mg by mouth daily.    . polyethylene glycol (MIRALAX / GLYCOLAX) packet Take 17 g by mouth daily.    . simvastatin (ZOCOR) 40 MG tablet Take 40 mg by mouth at bedtime.      Marland Kitchen venlafaxine XR (EFFEXOR-XR) 150 MG 24 hr capsule Take  150 mg by mouth daily.    . vitamin B-12 (CYANOCOBALAMIN) 1000 MCG tablet Take 1,000 mcg by mouth daily.       No current facility-administered medications for this visit.     Allergies:   Penicillins, Phenazopyridine hcl, and Ramipril   Social History:  The patient  reports that he quit smoking about 53 years ago. His smoking use included cigarettes. He started smoking about 67 years ago. He has a 5.00 pack-year smoking history. He quit smokeless tobacco use about 40 years ago.  His smokeless tobacco use included chew. He reports that he does not drink alcohol or use drugs.   Family History:  [name]  family history includes Heart disease in his father and sister.    ROS:  Please see the history of present illness.   All other systems are personally reviewed and negative.    Exam:    Vital Signs:  BP 112/62   Pulse 66   Ht 5\' 10"  (1.778 m)   Wt 142 lb 3.2 oz (64.5 kg)   BMI 20.40 kg/m   Well sounding, alert and conversant   Labs/Other Tests and Data Reviewed:    Recent Labs: 11/30/2018: ALT 11; BUN 16; Creatinine 1.32; Hemoglobin 10.9; Platelet Count 157; Potassium 4.2; Sodium 139   Wt Readings from Last 3 Encounters:  02/16/19 142 lb 3.2 oz (64.5 kg)  11/30/18 138 lb 3.2 oz (62.7 kg)  11/27/18 139 lb 12.8 oz (63.4 kg)     Last device remote is reviewed from [employer] PDF which reveals normal device function, no arrhythmias    ASSESSMENT & PLAN:    1.  Sick sinus syndrome Remotes are uptodate Normal device function 18 months estimated battery longevity  Atrial lead impedance is chronically low.  Pt reports "Dr Caryl Comes had to do my last generator change with bolt cutters and then lead got frayed.  He put silicone on it".    2. CAD No ischemic symptoms No changes today   Follow-up:  carelink Return to see me in a year   Patient Risk:  after full review of this patients clinical status, I feel that they are at moderate risk at this time.  Today, I have spent 15 minutes with the patient with telehealth technology discussing arrhythmia management .    Army Fossa, MD  02/16/2019 2:24 PM     [facility] [address] [place] [address] [phone] (office) [phone] (fax)

## 2019-02-28 ENCOUNTER — Ambulatory Visit (INDEPENDENT_AMBULATORY_CARE_PROVIDER_SITE_OTHER): Payer: Medicare Other | Admitting: *Deleted

## 2019-02-28 DIAGNOSIS — I495 Sick sinus syndrome: Secondary | ICD-10-CM

## 2019-03-01 LAB — CUP PACEART REMOTE DEVICE CHECK
Battery Impedance: 3267 Ohm
Battery Remaining Longevity: 16 mo
Battery Voltage: 2.72 V
Brady Statistic AP VP Percent: 0 %
Brady Statistic AP VS Percent: 50 %
Brady Statistic AS VP Percent: 0 %
Brady Statistic AS VS Percent: 49 %
Date Time Interrogation Session: 20200916201620
Implantable Lead Implant Date: 19990408
Implantable Lead Implant Date: 19990408
Implantable Lead Location: 753859
Implantable Lead Location: 753860
Implantable Lead Model: 4035
Implantable Lead Model: 4524
Implantable Lead Serial Number: 208202
Implantable Pulse Generator Implant Date: 20081126
Lead Channel Impedance Value: 1077 Ohm
Lead Channel Impedance Value: 127 Ohm
Lead Channel Pacing Threshold Amplitude: 1.75 V
Lead Channel Pacing Threshold Pulse Width: 0.4 ms
Lead Channel Setting Pacing Amplitude: 2 V
Lead Channel Setting Pacing Amplitude: 2.5 V
Lead Channel Setting Pacing Pulse Width: 0.76 ms
Lead Channel Setting Sensing Sensitivity: 4 mV

## 2019-03-02 ENCOUNTER — Inpatient Hospital Stay: Payer: Medicare Other

## 2019-03-02 ENCOUNTER — Inpatient Hospital Stay: Payer: Medicare Other | Attending: Oncology | Admitting: Oncology

## 2019-03-02 ENCOUNTER — Other Ambulatory Visit: Payer: Self-pay

## 2019-03-02 VITALS — BP 110/88 | HR 91 | Temp 98.2°F | Resp 18 | Ht 70.0 in | Wt 139.4 lb

## 2019-03-02 DIAGNOSIS — C61 Malignant neoplasm of prostate: Secondary | ICD-10-CM

## 2019-03-02 DIAGNOSIS — C7951 Secondary malignant neoplasm of bone: Secondary | ICD-10-CM | POA: Diagnosis not present

## 2019-03-02 DIAGNOSIS — Z5111 Encounter for antineoplastic chemotherapy: Secondary | ICD-10-CM | POA: Diagnosis not present

## 2019-03-02 DIAGNOSIS — I25119 Atherosclerotic heart disease of native coronary artery with unspecified angina pectoris: Secondary | ICD-10-CM | POA: Diagnosis not present

## 2019-03-02 LAB — CMP (CANCER CENTER ONLY)
ALT: 11 U/L (ref 0–44)
AST: 20 U/L (ref 15–41)
Albumin: 4.2 g/dL (ref 3.5–5.0)
Alkaline Phosphatase: 36 U/L — ABNORMAL LOW (ref 38–126)
Anion gap: 8 (ref 5–15)
BUN: 12 mg/dL (ref 8–23)
CO2: 28 mmol/L (ref 22–32)
Calcium: 9.2 mg/dL (ref 8.9–10.3)
Chloride: 102 mmol/L (ref 98–111)
Creatinine: 1.27 mg/dL — ABNORMAL HIGH (ref 0.61–1.24)
GFR, Est AFR Am: >60 mL/min (ref >60)
GFR, Estimated: 54 mL/min — ABNORMAL LOW (ref >60)
Glucose, Bld: 111 mg/dL — ABNORMAL HIGH (ref 70–99)
Potassium: 4.3 mmol/L (ref 3.5–5.1)
Sodium: 138 mmol/L (ref 135–145)
Total Bilirubin: 0.4 mg/dL (ref 0.3–1.2)
Total Protein: 6.5 g/dL (ref 6.5–8.1)

## 2019-03-02 LAB — CBC WITH DIFFERENTIAL (CANCER CENTER ONLY)
Abs Immature Granulocytes: 0.01 10*3/uL (ref 0.00–0.07)
Basophils Absolute: 0 10*3/uL (ref 0.0–0.1)
Basophils Relative: 1 %
Eosinophils Absolute: 0.2 10*3/uL (ref 0.0–0.5)
Eosinophils Relative: 3 %
HCT: 33.5 % — ABNORMAL LOW (ref 39.0–52.0)
Hemoglobin: 10.9 g/dL — ABNORMAL LOW (ref 13.0–17.0)
Immature Granulocytes: 0 %
Lymphocytes Relative: 29 %
Lymphs Abs: 1.4 10*3/uL (ref 0.7–4.0)
MCH: 29.3 pg (ref 26.0–34.0)
MCHC: 32.5 g/dL (ref 30.0–36.0)
MCV: 90.1 fL (ref 80.0–100.0)
Monocytes Absolute: 0.6 10*3/uL (ref 0.1–1.0)
Monocytes Relative: 11 %
Neutro Abs: 2.7 10*3/uL (ref 1.7–7.7)
Neutrophils Relative %: 56 %
Platelet Count: 162 10*3/uL (ref 150–400)
RBC: 3.72 MIL/uL — ABNORMAL LOW (ref 4.22–5.81)
RDW: 13.3 % (ref 11.5–15.5)
WBC Count: 4.9 10*3/uL (ref 4.0–10.5)
nRBC: 0 % (ref 0.0–0.2)

## 2019-03-02 MED ORDER — LEUPROLIDE ACETATE (3 MONTH) 22.5 MG ~~LOC~~ KIT
22.5000 mg | PACK | Freq: Once | SUBCUTANEOUS | Status: AC
  Administered 2019-03-02: 22.5 mg via SUBCUTANEOUS
  Filled 2019-03-02: qty 22.5

## 2019-03-02 NOTE — Progress Notes (Signed)
Hematology and Oncology Follow Up Visit  Russell Browning [id number] December 28, 1940 78 y.o. 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    Prior Therapy: 1. He received radiation therapy and 2 years of Lupron for locally advanced prostate cancer at the time of diagnosis in 2000.  2.  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  He was started on Lupron at that time.  3. He is S/P radiation therapy directed to the right hip (30 gray in 12 fractions) concluded in 07/2011 for palliative purposes.    Current therapy:  1. Lupron 22.5 mg started in 2013.  He has been receiving it every 3 months.  2. Delton See given every 3 months started in 10/2011.  Last dose was given in 2019 based on patient's preference.  Interim History:  Mr. Wenhold is here for return evaluation.  Since the last visit, he reports no major changes in his health.  He had minor trauma to his right nostril that has been bleeding intermittently.  He denies any epistaxis or any other bleeding complications.  He denies any bone pain or pathological fractures.  His performance status and quality of life remains unchanged.  He has tolerated androgen deprivation therapy without any complaints.  Patient denied any alteration mental status, neuropathy, confusion or dizziness.  Denies any headaches or lethargy.  Denies any night sweats, weight loss or changes in appetite.  Denied orthopnea, dyspnea on exertion or chest discomfort.  Denies shortness of breath, difficulty breathing hemoptysis or cough.  Denies any abdominal distention, nausea, early satiety or dyspepsia.  Denies any hematuria, frequency, dysuria or nocturia.  Denies any skin irritation, dryness or rash.  Denies any ecchymosis or petechiae.  Denies any lymphadenopathy or clotting.  Denies any heat or cold intolerance.  Denies any  anxiety or depression.  Remaining review of system is negative.              Medications: Updated on review.  Current Outpatient Medications  Medication Sig Dispense Refill  . aspirin EC 81 MG EC tablet Take 1 tablet (81 mg total) by mouth daily. 30 tablet 11  . Calcium Carbonate-Vitamin D (CALCIUM-VITAMIN D) 500-200 MG-UNIT per tablet Take 1 tablet by mouth daily.    . clonazePAM (KLONOPIN) 1 MG tablet Take 2 mg by mouth at bedtime.     . clopidogrel (PLAVIX) 75 MG tablet TAKE ONE TABLET BY MOUTH DAILY 90 tablet 2  . isosorbide mononitrate (IMDUR) 30 MG 24 hr tablet TAKE ONE TABLET BY MOUTH DAILY 90 tablet 2  . leuprolide (LUPRON) 22.5 MG injection Inject 22.5 mg into the muscle every 3 (three) months.    . levothyroxine (SYNTHROID, LEVOTHROID) 50 MCG tablet Take 1 tablet by mouth daily.    . nitroGLYCERIN (NITROSTAT) 0.4 MG SL tablet Place 1 tablet (0.4 mg total) under the tongue every 5 (five) minutes as needed for chest pain. 25 tablet 3  . pantoprazole (PROTONIX) 40 MG tablet Take 40 mg by mouth daily.    . polyethylene glycol (MIRALAX / GLYCOLAX) packet Take 17 g by mouth daily.    . simvastatin (ZOCOR) 40 MG tablet Take 40 mg by mouth at bedtime.      Marland Kitchen venlafaxine XR (EFFEXOR-XR) 150 MG 24 hr capsule Take 150 mg by mouth daily.    . vitamin B-12 (CYANOCOBALAMIN) 1000 MCG tablet  Take 1,000 mcg by mouth daily.       No current facility-administered medications for this visit.      Allergies:  Allergies  Allergen Reactions  . Penicillins Other (See Comments)    Passed out  . Phenazopyridine Hcl Hives  . Ramipril Hives    Past Medical History, Surgical history, Social history, and Family History without any change on review.   Physical Exam:     ECOG: 1     General appearance: Alert, awake without any distress. Head: Atraumatic without abnormalities Oropharynx: Without any thrush or ulcers. Nasopharynx: Bleeding noted from his right nostril related to a  minor irritation on the skin. Eyes: No scleral icterus. Lymph nodes: No lymphadenopathy noted in the cervical, supraclavicular, or axillary nodes Heart:regular rate and rhythm, without any murmurs or gallops.   Lung: Clear to auscultation without any rhonchi, wheezes or dullness to percussion. Abdomin: Soft, nontender without any shifting dullness or ascites. Musculoskeletal: No clubbing or cyanosis. Neurological: No motor or sensory deficits. Skin: No rashes or lesions. Psychiatric: Mood and affect appeared normal.      Lab Results: Lab Results  Component Value Date   WBC 4.3 11/30/2018   HGB 10.9 (L) 11/30/2018   HCT 34.5 (L) 11/30/2018   MCV 93.8 11/30/2018   PLT 157 11/30/2018    Results for Russell Browning, Russell SR. (MRN [id number]) as of 03/02/2019 11:51  Ref. Range 09/01/2018 11:15 11/30/2018 09:33  Prostate Specific Ag, Serum Latest Ref Range: 0.0 - 4.0 ng/mL 0.2 0.2          Impression and Plan:   78 year old man with:   1.  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  Marland Kitchen     He has tolerated androgen deprivation therapy without any major complications.  His PSA continues to be under control without any signs or symptoms of disease progression.  Risks and benefits of therapy escalation such as systemic chemotherapy or Zytiga reviewed.  Deferring these options to a later date was also discussed if he developed castration resistant disease.  At this time we will continue with androgen deprivation therapy alone.  2. Bone pain: No recent exacerbations or issues at this time.  3. Bone directed therapy: He has been on Xgeva and the therapy has been withheld based on his requests.  This will be resumed in the future if he develops progression of disease.  4. Androgen depravation: Complications were reiterated which includes osteoporosis, hot flashes among others.  He will continue to receive that every 3 months.  He will be switched to  Eligard preparation.  5. Follow up: In 3 months for repeat follow-up.  25  minutes was spent with the patient face-to-face today.  More than 50% of time was spent on reviewing his disease status, treatment options and complications of therapy.   Zola Button, MD 9/18/202011:50 AM

## 2019-03-02 NOTE — Patient Instructions (Signed)

## 2019-03-03 LAB — PROSTATE-SPECIFIC AG, SERUM (LABCORP): Prostate Specific Ag, Serum: 0.2 ng/mL (ref 0.0–4.0)

## 2019-03-05 ENCOUNTER — Telehealth: Payer: Self-pay | Admitting: Oncology

## 2019-03-05 NOTE — Telephone Encounter (Signed)
Scheduled appt per 9/18 sch message,  Printed and mailed appt calendar.

## 2019-03-06 ENCOUNTER — Encounter: Payer: Self-pay | Admitting: Cardiology

## 2019-03-06 NOTE — Progress Notes (Signed)
Remote pacemaker transmission.   

## 2019-04-03 DIAGNOSIS — E039 Hypothyroidism, unspecified: Secondary | ICD-10-CM | POA: Diagnosis not present

## 2019-04-03 DIAGNOSIS — K219 Gastro-esophageal reflux disease without esophagitis: Secondary | ICD-10-CM | POA: Diagnosis not present

## 2019-04-03 DIAGNOSIS — Z79891 Long term (current) use of opiate analgesic: Secondary | ICD-10-CM | POA: Diagnosis not present

## 2019-04-03 DIAGNOSIS — I10 Essential (primary) hypertension: Secondary | ICD-10-CM | POA: Diagnosis not present

## 2019-04-06 DIAGNOSIS — C61 Malignant neoplasm of prostate: Secondary | ICD-10-CM | POA: Diagnosis not present

## 2019-04-06 DIAGNOSIS — I25119 Atherosclerotic heart disease of native coronary artery with unspecified angina pectoris: Secondary | ICD-10-CM | POA: Diagnosis not present

## 2019-04-06 DIAGNOSIS — Z23 Encounter for immunization: Secondary | ICD-10-CM | POA: Diagnosis not present

## 2019-04-06 DIAGNOSIS — Z6820 Body mass index (BMI) 20.0-20.9, adult: Secondary | ICD-10-CM | POA: Diagnosis not present

## 2019-04-06 DIAGNOSIS — D630 Anemia in neoplastic disease: Secondary | ICD-10-CM | POA: Diagnosis not present

## 2019-04-06 DIAGNOSIS — D692 Other nonthrombocytopenic purpura: Secondary | ICD-10-CM | POA: Diagnosis not present

## 2019-04-06 DIAGNOSIS — Z0001 Encounter for general adult medical examination with abnormal findings: Secondary | ICD-10-CM | POA: Diagnosis not present

## 2019-04-06 DIAGNOSIS — I10 Essential (primary) hypertension: Secondary | ICD-10-CM | POA: Diagnosis not present

## 2019-05-14 DIAGNOSIS — E782 Mixed hyperlipidemia: Secondary | ICD-10-CM | POA: Diagnosis not present

## 2019-05-14 DIAGNOSIS — I10 Essential (primary) hypertension: Secondary | ICD-10-CM | POA: Diagnosis not present

## 2019-05-22 NOTE — Progress Notes (Signed)
Virtual Visit via Telephone Note   This visit type was conducted due to national recommendations for restrictions regarding the COVID-19 Pandemic (e.g. social distancing) in an effort to limit this patient's exposure and mitigate transmission in our community.  Due to his co-morbid illnesses, this patient is at least at moderate risk for complications without adequate follow up.  This format is felt to be most appropriate for this patient at this time.  The patient did not have access to video technology/had technical difficulties with video requiring transitioning to audio format only (telephone).  All issues noted in this document were discussed and addressed.  No physical exam could be performed with this format.  Please refer to the patient's chart for his  consent to telehealth for [facility].   Date:  05/23/2019   ID:  Russell Flattery Sr., DOB 06/01/1941, MRN [id number]  Patient Location: Home Provider Location: Office  PCP:  Caryl Bis, MD  Cardiologist:  Rozann Lesches, MD Electrophysiologist:  Thompson Grayer, MD   Evaluation Performed:  Follow-Up Visit  Chief Complaint:  Cardiac follow-up  History of Present Illness:    Russell Browning. is a 78 y.o. male last seen in June.  We spoke by phone today.  He tells me that he has been doing well overall, no angina symptoms or nitroglycerin use.  He and his wife have been using a mask consistently when they go out, he does some chores outdoors, but avoids the very cold weather.  He follows with Dr. Rayann Heman in the device clinic, Medtronic pacemaker in place.  He does not report any palpitations or syncope.  I reviewed lab work from May including lipid panel as outlined below.  He reports no medication intolerances.  Current cardiac regimen includes aspirin, Plavix, Imdur, Zocor, and as needed nitroglycerin.  Echocardiogram from 2016 is outlined below.  The patient does not have symptoms concerning for COVID-19 infection (fever,  chills, cough, or new shortness of breath).    Past Medical History:  Diagnosis Date  . Anemia   . CAD (coronary artery disease), native coronary artery    a. DES to proximal, mid, and distal RCA 2006. b. 12/2014 - [place] s/p  PTCA to the mid-distal previously placed RCA stents, otherwise nonobstructive CAD for continued medical therapy.  Marland Kitchen History of TIAs   . Mixed hyperlipidemia   . 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 Prostate cancer metastatic to bone ([place])   . Sinus node dysfunction [facility])    Medtronic pacemaker - Dr. Rayann Heman   Past Surgical History:  Procedure Laterality Date  . CARDIAC CATHETERIZATION N/A 01/06/2015   Procedure: Left Heart Cath and Coronary Angiography;  Surgeon: Troy Sine, MD;  Location: [place] CV [facility];  Service: Cardiovascular;  Laterality: N/A;  . PACEMAKER INSERTION     MDT     Current Meds  Medication Sig  . aspirin EC 81 MG EC tablet Take 1 tablet (81 mg total) by mouth daily.  . Calcium Carbonate-Vitamin D (CALCIUM-VITAMIN D) 500-200 MG-UNIT per tablet Take 1 tablet by mouth daily.  . clonazePAM (KLONOPIN) 1 MG tablet Take 2 mg by mouth at bedtime.   . clopidogrel (PLAVIX) 75 MG tablet TAKE ONE TABLET BY MOUTH DAILY  . ELDERBERRY PO Take 1 tablet by mouth 2 (two) times daily.  Marland Kitchen guaiFENesin (MUCINEX PO) Take by mouth  as needed.  . isosorbide mononitrate (IMDUR) 30 MG 24 hr tablet TAKE ONE TABLET BY MOUTH DAILY  . leuprolide (LUPRON) 22.5 MG injection Inject 22.5 mg into the muscle every 3 (three) months.  . levothyroxine (SYNTHROID, LEVOTHROID) 50 MCG tablet Take 1 tablet by mouth daily.  . nitroGLYCERIN (NITROSTAT) 0.4 MG SL tablet Place 1 tablet (0.4 mg total) under the tongue every 5 (five) minutes as needed for chest pain.  . pantoprazole (PROTONIX) 40 MG tablet Take 40 mg by mouth daily.  . polyethylene glycol (MIRALAX /  GLYCOLAX) packet Take 17 g by mouth daily.  . simvastatin (ZOCOR) 40 MG tablet Take 40 mg by mouth at bedtime.    Marland Kitchen venlafaxine XR (EFFEXOR-XR) 150 MG 24 hr capsule Take 150 mg by mouth daily.  . vitamin B-12 (CYANOCOBALAMIN) 1000 MCG tablet Take 1,000 mcg by mouth daily.       Allergies:   Penicillins, Phenazopyridine hcl, and Ramipril   Social History   Tobacco Use  . Smoking status: Former Smoker    Packs/day: 1.00    Years: 5.00    Pack years: 5.00    Types: Cigarettes    Start date: 09/15/1951    Quit date: 06/14/1965    Years since quitting: 53.9  . Smokeless tobacco: Former Systems developer    Types: Chew    Quit date: 06/14/1978  . Tobacco comment: chewed tobacco 5 years 1PPD  Substance Use Topics  . Alcohol use: No    Alcohol/week: 0.0 standard drinks  . Drug use: No     Family Hx: [name] family history includes Heart disease in his father and sister.  ROS:   Please see the history of present illness.    Recent mild "chest cold."  No fevers or chills. All other systems reviewed and are negative.   Prior CV studies:   The following studies were reviewed today:  Lexiscan Myoview 06/21/2017:  No diagnostic ST segment changes to indicate ischemia.  Small, mild intensity, mid to apical inferoseptal defect is partially reversible. Suggestive of a mild ischemic territory.  This is a low risk study.  Nuclear stress EF: 56%.  Echocardiogram 04/09/2015: Study Conclusions  - Left ventricle: The cavity size was normal. Wall thickness was   normal. Systolic function was normal. The estimated ejection   fraction was in the range of 55% to 60%. Indeterminate diastolic   function. Wall motion was normal; there were no regional wall   motion abnormalities. - Aortic valve: Mildly calcified annulus. Mildly thickened   leaflets. There was mild to moderate regurgitation. The AI VC is   0.3 cm. Valve area (VTI): 3.02 cm^2. Valve area (Vmax): 2.89   cm^2. Valve area (Vmean): 2.98  cm^2. Regurgitation pressure   half-time: 568 ms. - Mitral valve: There was mild regurgitation. - Left atrium: The atrium was moderately dilated. - Systemic veins: IVC is small suggesting low RA pressure and   hypovolemia. - Technically difficult study.  Labs/Other Tests and Data Reviewed:    EKG:  An ECG dated 11/27/2018 was personally reviewed today and demonstrated:  Sinus rhythm with increased voltage.  Recent Labs: 03/02/2019: ALT 11; BUN 12; Creatinine 1.27; Hemoglobin 10.9; Platelet Count 162; Potassium 4.3; Sodium 138  May 2020: BUN 17, creatinine 1.27, potassium 4.7, AST 18, ALT 10, TSH 1.61, cholesterol 152, triglycerides 85, HDL 56, LDL 80   Wt Readings from Last 3 Encounters:  05/23/19 142 lb (64.4 kg)  03/02/19 139 lb 6.4 oz (63.2 kg)  02/16/19  142 lb 3.2 oz (64.5 kg)     Objective:    Vital Signs:  BP 108/79   Pulse 73   Ht 5\' 9"  (1.753 m)   Wt 142 lb (64.4 kg)   BMI 20.97 kg/m    Patient spoke in full sentences, not short of breath. No audible wheezing or coughing. Speech pattern normal.  ASSESSMENT & PLAN:    1.  CAD status post previous RCA interventions as described above.  He is doing well without active angina and we continue with medical therapy and observation.  Ischemic testing from 2019 was low risk.  Continue aspirin, Plavix, Imdur, and Zocor.  2.  Mixed hyperlipidemia, he remains on Zocor without obvious side effects.  Last LDL 80.  3.  Sinus node dysfunction with Medtronic pacemaker in place.  Keep regular follow-up with Dr. Rayann Heman.  COVID-19 Education: The signs and symptoms of COVID-19 were discussed with the patient and how to seek care for testing (follow up with PCP or arrange E-visit).  The importance of social distancing was discussed today.  Time:   Today, I have spent 5 minutes with the patient with telehealth technology discussing the above problems.     Medication Adjustments/Labs and Tests Ordered: Current medicines are reviewed  at length with the patient today.  Concerns regarding medicines are outlined above.   Tests Ordered: No orders of the defined types were placed in this encounter.   Medication Changes: No orders of the defined types were placed in this encounter.   Follow Up:  In Person 6 months in the [place] office.  Signed, Rozann Lesches, MD  05/23/2019 10:02 AM    [place]

## 2019-05-23 ENCOUNTER — Telehealth (INDEPENDENT_AMBULATORY_CARE_PROVIDER_SITE_OTHER): Payer: Medicare Other | Admitting: Cardiology

## 2019-05-23 ENCOUNTER — Encounter: Payer: Self-pay | Admitting: Cardiology

## 2019-05-23 VITALS — BP 108/79 | HR 73 | Ht 69.0 in | Wt 142.0 lb

## 2019-05-23 DIAGNOSIS — Z950 Presence of cardiac pacemaker: Secondary | ICD-10-CM

## 2019-05-23 DIAGNOSIS — I25119 Atherosclerotic heart disease of native coronary artery with unspecified angina pectoris: Secondary | ICD-10-CM | POA: Diagnosis not present

## 2019-05-23 DIAGNOSIS — I495 Sick sinus syndrome: Secondary | ICD-10-CM

## 2019-05-23 DIAGNOSIS — E782 Mixed hyperlipidemia: Secondary | ICD-10-CM

## 2019-05-23 NOTE — Patient Instructions (Signed)
Medication Instructions:  Continue all current medications.   Labwork: none  Testing/Procedures: none  Follow-Up: 6 months   Any Other Special Instructions Will Be Listed Below (If Applicable).   If you need a refill on your cardiac medications before your next appointment, please call your pharmacy.  

## 2019-05-25 ENCOUNTER — Inpatient Hospital Stay: Payer: Medicare Other | Attending: Oncology

## 2019-05-25 ENCOUNTER — Inpatient Hospital Stay: Payer: Medicare Other

## 2019-05-25 ENCOUNTER — Other Ambulatory Visit: Payer: Self-pay

## 2019-05-25 ENCOUNTER — Inpatient Hospital Stay (HOSPITAL_BASED_OUTPATIENT_CLINIC_OR_DEPARTMENT_OTHER): Payer: Medicare Other | Admitting: Oncology

## 2019-05-25 VITALS — BP 107/70 | HR 91 | Temp 97.8°F | Resp 18 | Ht 69.0 in | Wt 142.2 lb

## 2019-05-25 DIAGNOSIS — C7951 Secondary malignant neoplasm of bone: Secondary | ICD-10-CM | POA: Insufficient documentation

## 2019-05-25 DIAGNOSIS — Z5111 Encounter for antineoplastic chemotherapy: Secondary | ICD-10-CM | POA: Insufficient documentation

## 2019-05-25 DIAGNOSIS — C61 Malignant neoplasm of prostate: Secondary | ICD-10-CM

## 2019-05-25 DIAGNOSIS — I25119 Atherosclerotic heart disease of native coronary artery with unspecified angina pectoris: Secondary | ICD-10-CM

## 2019-05-25 LAB — CBC WITH DIFFERENTIAL (CANCER CENTER ONLY)
Abs Immature Granulocytes: 0.01 10*3/uL (ref 0.00–0.07)
Basophils Absolute: 0 10*3/uL (ref 0.0–0.1)
Basophils Relative: 0 %
Eosinophils Absolute: 0.2 10*3/uL (ref 0.0–0.5)
Eosinophils Relative: 3 %
HCT: 35.2 % — ABNORMAL LOW (ref 39.0–52.0)
Hemoglobin: 11.5 g/dL — ABNORMAL LOW (ref 13.0–17.0)
Immature Granulocytes: 0 %
Lymphocytes Relative: 26 %
Lymphs Abs: 1.5 10*3/uL (ref 0.7–4.0)
MCH: 29.6 pg (ref 26.0–34.0)
MCHC: 32.7 g/dL (ref 30.0–36.0)
MCV: 90.5 fL (ref 80.0–100.0)
Monocytes Absolute: 0.5 10*3/uL (ref 0.1–1.0)
Monocytes Relative: 9 %
Neutro Abs: 3.7 10*3/uL (ref 1.7–7.7)
Neutrophils Relative %: 62 %
Platelet Count: 175 10*3/uL (ref 150–400)
RBC: 3.89 MIL/uL — ABNORMAL LOW (ref 4.22–5.81)
RDW: 14.1 % (ref 11.5–15.5)
WBC Count: 6 10*3/uL (ref 4.0–10.5)
nRBC: 0 % (ref 0.0–0.2)

## 2019-05-25 LAB — CMP (CANCER CENTER ONLY)
ALT: 10 U/L (ref 0–44)
AST: 17 U/L (ref 15–41)
Albumin: 4.3 g/dL (ref 3.5–5.0)
Alkaline Phosphatase: 37 U/L — ABNORMAL LOW (ref 38–126)
Anion gap: 7 (ref 5–15)
BUN: 14 mg/dL (ref 8–23)
CO2: 30 mmol/L (ref 22–32)
Calcium: 9.2 mg/dL (ref 8.9–10.3)
Chloride: 102 mmol/L (ref 98–111)
Creatinine: 1.18 mg/dL (ref 0.61–1.24)
GFR, Est AFR Am: >60 mL/min (ref >60)
GFR, Estimated: 59 mL/min — ABNORMAL LOW (ref >60)
Glucose, Bld: 92 mg/dL (ref 70–99)
Potassium: 4.6 mmol/L (ref 3.5–5.1)
Sodium: 139 mmol/L (ref 135–145)
Total Bilirubin: 0.4 mg/dL (ref 0.3–1.2)
Total Protein: 6.8 g/dL (ref 6.5–8.1)

## 2019-05-25 MED ORDER — LEUPROLIDE ACETATE (3 MONTH) 22.5 MG ~~LOC~~ KIT
22.5000 mg | PACK | Freq: Once | SUBCUTANEOUS | Status: AC
  Administered 2019-05-25: 22.5 mg via SUBCUTANEOUS
  Filled 2019-05-25: qty 22.5

## 2019-05-25 NOTE — Patient Instructions (Signed)

## 2019-05-25 NOTE — Progress Notes (Signed)
Hematology and Oncology Follow Up Visit  Russell Browning [id number] 1940-11-10 78 y.o. 05/25/2019 12:05 PM    Principle Diagnosis:  78 year old man with advanced prostate cancer with disease to the bone diagnosed in 2013.  He continues to have castration-sensitive disease at this time. Prior Therapy: 1. He received radiation therapy and 2 years of Lupron for locally advanced prostate cancer at the time of diagnosis in 2000.  2.  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  He was started on Lupron at that time.  3. He is S/P radiation therapy directed to the right hip (30 gray in 12 fractions) concluded in 07/2011 for palliative purposes.    Current therapy:  1. Lupron 22.5 mg started in 2013.  His last Eligard given on 03/02/2019 and we repeated on May 25, 2019.  2. Delton See given every 3 months started in 10/2011.  Therapy discontinued due to based on patient's preference.  Interim History:  Russell Browning resents today for a follow-up visit.  Since the last visit, he reports no major changes in his health.  He continues to tolerate androgen deprivation without any major complications.  He denies any nausea, fatigue but worsening bone pain.  His performance status and quality of life remains unchanged at this time.  He denied headaches, blurry vision, syncope or seizures.  Denies any fevers, chills or sweats.  Denied chest pain, palpitation, orthopnea or leg edema.  Denied cough, wheezing or hemoptysis.  Denied nausea, vomiting or abdominal pain.  Denies any constipation or diarrhea.  Denies any frequency urgency or hesitancy.  Denies any arthralgias or myalgias.  Denies any skin rashes or lesions.  Denies any bleeding or clotting tendency.  Denies any easy bruising.  Denies any hair or nail changes.  Denies any anxiety or depression.  Remaining review of system is negative.      Medications: Without any changes on review.  Current  Outpatient Medications  Medication Sig Dispense Refill  . aspirin EC 81 MG EC tablet Take 1 tablet (81 mg total) by mouth daily. 30 tablet 11  . Calcium Carbonate-Vitamin D (CALCIUM-VITAMIN D) 500-200 MG-UNIT per tablet Take 1 tablet by mouth daily.    . clonazePAM (KLONOPIN) 1 MG tablet Take 2 mg by mouth at bedtime.     . clopidogrel (PLAVIX) 75 MG tablet TAKE ONE TABLET BY MOUTH DAILY 90 tablet 2  . ELDERBERRY PO Take 1 tablet by mouth 2 (two) times daily.    Marland Kitchen guaiFENesin (MUCINEX PO) Take by mouth as needed.    . isosorbide mononitrate (IMDUR) 30 MG 24 hr tablet TAKE ONE TABLET BY MOUTH DAILY 90 tablet 2  . leuprolide (LUPRON) 22.5 MG injection Inject 22.5 mg into the muscle every 3 (three) months.    . levothyroxine (SYNTHROID, LEVOTHROID) 50 MCG tablet Take 1 tablet by mouth daily.    . nitroGLYCERIN (NITROSTAT) 0.4 MG SL tablet Place 1 tablet (0.4 mg total) under the tongue every 5 (five) minutes as needed for chest pain. 25 tablet 3  . pantoprazole (PROTONIX) 40 MG tablet Take 40 mg by mouth daily.    . polyethylene glycol (MIRALAX / GLYCOLAX) packet Take 17 g by mouth daily.    . simvastatin (ZOCOR) 40 MG tablet Take 40 mg by mouth at bedtime.      Marland Kitchen venlafaxine XR (EFFEXOR-XR) 150 MG 24 hr capsule Take 150 mg by mouth daily.    . vitamin B-12 (  CYANOCOBALAMIN) 1000 MCG tablet Take 1,000 mcg by mouth daily.       No current facility-administered medications for this visit.     Allergies:  Allergies  Allergen Reactions  . Penicillins Other (See Comments)    Passed out  . Phenazopyridine Hcl Hives  . Ramipril Hives    Past Medical History, Surgical history, Social history, and Family History unchanged on review.   Physical Exam:  Blood pressure 107/70, pulse 91, temperature 97.8 F (36.6 C), temperature source Temporal, resp. rate 18, height 5\' 9"  (1.753 m), weight 142 lb 3.2 oz (64.5 kg), SpO2 100 %.     ECOG: 1     General appearance: Comfortable appearing  without any discomfort Head: Normocephalic without any trauma Oropharynx: Mucous membranes are moist and pink without any thrush or ulcers. Eyes: Pupils are equal and round reactive to light. Lymph nodes: No cervical, supraclavicular, inguinal or axillary lymphadenopathy.   Heart:regular rate and rhythm.  S1 and S2 without leg edema. Lung: Clear without any rhonchi or wheezes.  No dullness to percussion. Abdomin: Soft, nontender, nondistended with good bowel sounds.  No hepatosplenomegaly. Musculoskeletal: No joint deformity or effusion.  Full range of motion noted. Neurological: No deficits noted on motor, sensory and deep tendon reflex exam. Skin: No petechial rash or dryness.  Appeared moist.        Lab Results: Lab Results  Component Value Date   WBC 6.0 05/25/2019   HGB 11.5 (L) 05/25/2019   HCT 35.2 (L) 05/25/2019   MCV 90.5 05/25/2019   PLT 175 05/25/2019      Results for Russell Browning, Russell SR. (MRN [id number]) as of 05/25/2019 12:06  Ref. Range 11/30/2018 09:33 03/02/2019 11:45  Prostate Specific Ag, Serum Latest Ref Range: 0.0 - 4.0 ng/mL 0.2 0.2         Impression and Plan:   78 year old man with:   1.  Prostate cancer with disease to the bone that is currently castration-sensitive since 2013.Marland Kitchen    He continues to be on androgen deprivation therapy alone without any complications.  Therapy escalation has been reviewed in the past and has been deferred unless he develops castration resistant disease.   2. Bone pain: N no changes at this time and his pain remains manageable.  3. Bone directed therapy: He has been on Xofigo which has been deferred at this time based on his wishes.  4. Androgen depravation: He has received Eligard in September we repeated today.  Long-term complications including sexual dysfunction, hot flashes and weight gain was reviewed.  We also discussed the importance of calcium and vitamin D supplements event osteoporosis.  5. Follow up: He  will return in 3 months for his next evaluation and injection.  15  minutes was spent with the patient face-to-face today.  More than 50% of time was dedicated to reviewing his disease status, treatment options and dealing with complications related to his current therapy.   Zola Button, MD 12/11/202012:05 PM

## 2019-05-26 LAB — PROSTATE-SPECIFIC AG, SERUM (LABCORP): Prostate Specific Ag, Serum: 0.2 ng/mL (ref 0.0–4.0)

## 2019-05-28 ENCOUNTER — Telehealth: Payer: Self-pay

## 2019-05-28 ENCOUNTER — Telehealth: Payer: Self-pay | Admitting: Oncology

## 2019-05-28 NOTE — Telephone Encounter (Signed)
Scheduled appt per 12/11 los.  Spoke with pt and he is aware of the appt date and time

## 2019-05-28 NOTE — Telephone Encounter (Signed)
-----   Message from Russell Portela, MD sent at 05/28/2019  8:34 AM EST ----- Please let him know his PSA still low

## 2019-05-28 NOTE — Telephone Encounter (Signed)
Called patient and made him aware of PSA results. Patient verbalized understanding.  °

## 2019-05-30 ENCOUNTER — Ambulatory Visit (INDEPENDENT_AMBULATORY_CARE_PROVIDER_SITE_OTHER): Payer: Medicare Other | Admitting: *Deleted

## 2019-05-30 DIAGNOSIS — Z950 Presence of cardiac pacemaker: Secondary | ICD-10-CM

## 2019-05-31 LAB — CUP PACEART REMOTE DEVICE CHECK
Battery Impedance: 3510 Ohm
Battery Remaining Longevity: 14 mo
Battery Voltage: 2.72 V
Brady Statistic AP VP Percent: 0 %
Brady Statistic AP VS Percent: 51 %
Brady Statistic AS VP Percent: 0 %
Brady Statistic AS VS Percent: 49 %
Date Time Interrogation Session: 20201217134351
Implantable Lead Implant Date: 19990408
Implantable Lead Implant Date: 19990408
Implantable Lead Location: 753859
Implantable Lead Location: 753860
Implantable Lead Model: 4035
Implantable Lead Model: 4524
Implantable Lead Serial Number: 208202
Implantable Pulse Generator Implant Date: 20081126
Lead Channel Impedance Value: 1158 Ohm
Lead Channel Impedance Value: 125 Ohm
Lead Channel Pacing Threshold Amplitude: 1.75 V
Lead Channel Pacing Threshold Pulse Width: 0.4 ms
Lead Channel Setting Pacing Amplitude: 2 V
Lead Channel Setting Pacing Amplitude: 2.5 V
Lead Channel Setting Pacing Pulse Width: 0.76 ms
Lead Channel Setting Sensing Sensitivity: 5.6 mV

## 2019-06-04 ENCOUNTER — Other Ambulatory Visit: Payer: Self-pay | Admitting: Cardiology

## 2019-07-09 DIAGNOSIS — R079 Chest pain, unspecified: Secondary | ICD-10-CM | POA: Diagnosis not present

## 2019-07-09 DIAGNOSIS — Z20828 Contact with and (suspected) exposure to other viral communicable diseases: Secondary | ICD-10-CM | POA: Diagnosis not present

## 2019-07-09 DIAGNOSIS — R05 Cough: Secondary | ICD-10-CM | POA: Diagnosis not present

## 2019-07-13 DIAGNOSIS — I10 Essential (primary) hypertension: Secondary | ICD-10-CM | POA: Diagnosis not present

## 2019-07-13 DIAGNOSIS — K219 Gastro-esophageal reflux disease without esophagitis: Secondary | ICD-10-CM | POA: Diagnosis not present

## 2019-07-14 DIAGNOSIS — Z20828 Contact with and (suspected) exposure to other viral communicable diseases: Secondary | ICD-10-CM | POA: Diagnosis not present

## 2019-07-20 DIAGNOSIS — Z20828 Contact with and (suspected) exposure to other viral communicable diseases: Secondary | ICD-10-CM | POA: Diagnosis not present

## 2019-07-20 DIAGNOSIS — J329 Chronic sinusitis, unspecified: Secondary | ICD-10-CM | POA: Diagnosis not present

## 2019-07-26 ENCOUNTER — Other Ambulatory Visit: Payer: Self-pay | Admitting: Cardiology

## 2019-08-06 DIAGNOSIS — I10 Essential (primary) hypertension: Secondary | ICD-10-CM | POA: Diagnosis not present

## 2019-08-06 DIAGNOSIS — D630 Anemia in neoplastic disease: Secondary | ICD-10-CM | POA: Diagnosis not present

## 2019-08-06 DIAGNOSIS — Z79891 Long term (current) use of opiate analgesic: Secondary | ICD-10-CM | POA: Diagnosis not present

## 2019-08-06 DIAGNOSIS — K2100 Gastro-esophageal reflux disease with esophagitis, without bleeding: Secondary | ICD-10-CM | POA: Diagnosis not present

## 2019-08-06 DIAGNOSIS — E782 Mixed hyperlipidemia: Secondary | ICD-10-CM | POA: Diagnosis not present

## 2019-08-08 DIAGNOSIS — Z23 Encounter for immunization: Secondary | ICD-10-CM | POA: Diagnosis not present

## 2019-08-08 DIAGNOSIS — R4582 Worries: Secondary | ICD-10-CM | POA: Diagnosis not present

## 2019-08-08 DIAGNOSIS — F331 Major depressive disorder, recurrent, moderate: Secondary | ICD-10-CM | POA: Diagnosis not present

## 2019-08-08 DIAGNOSIS — I25119 Atherosclerotic heart disease of native coronary artery with unspecified angina pectoris: Secondary | ICD-10-CM | POA: Diagnosis not present

## 2019-08-08 DIAGNOSIS — D630 Anemia in neoplastic disease: Secondary | ICD-10-CM | POA: Diagnosis not present

## 2019-08-08 DIAGNOSIS — D692 Other nonthrombocytopenic purpura: Secondary | ICD-10-CM | POA: Diagnosis not present

## 2019-08-08 DIAGNOSIS — I10 Essential (primary) hypertension: Secondary | ICD-10-CM | POA: Diagnosis not present

## 2019-08-08 DIAGNOSIS — C61 Malignant neoplasm of prostate: Secondary | ICD-10-CM | POA: Diagnosis not present

## 2019-08-23 ENCOUNTER — Inpatient Hospital Stay: Payer: Medicare Other | Attending: Oncology

## 2019-08-23 ENCOUNTER — Inpatient Hospital Stay: Payer: Medicare Other

## 2019-08-23 ENCOUNTER — Inpatient Hospital Stay (HOSPITAL_BASED_OUTPATIENT_CLINIC_OR_DEPARTMENT_OTHER): Payer: Medicare Other | Admitting: Oncology

## 2019-08-23 ENCOUNTER — Other Ambulatory Visit: Payer: Self-pay

## 2019-08-23 VITALS — BP 108/83 | HR 92 | Temp 97.8°F | Resp 18 | Wt 145.2 lb

## 2019-08-23 DIAGNOSIS — C61 Malignant neoplasm of prostate: Secondary | ICD-10-CM | POA: Diagnosis not present

## 2019-08-23 DIAGNOSIS — Z5111 Encounter for antineoplastic chemotherapy: Secondary | ICD-10-CM | POA: Insufficient documentation

## 2019-08-23 DIAGNOSIS — C7951 Secondary malignant neoplasm of bone: Secondary | ICD-10-CM | POA: Diagnosis not present

## 2019-08-23 LAB — CBC WITH DIFFERENTIAL (CANCER CENTER ONLY)
Abs Immature Granulocytes: 0 10*3/uL (ref 0.00–0.07)
Basophils Absolute: 0.1 10*3/uL (ref 0.0–0.1)
Basophils Relative: 1 %
Eosinophils Absolute: 0.2 10*3/uL (ref 0.0–0.5)
Eosinophils Relative: 3 %
HCT: 35.7 % — ABNORMAL LOW (ref 39.0–52.0)
Hemoglobin: 11.5 g/dL — ABNORMAL LOW (ref 13.0–17.0)
Immature Granulocytes: 0 %
Lymphocytes Relative: 32 %
Lymphs Abs: 1.7 10*3/uL (ref 0.7–4.0)
MCH: 29.7 pg (ref 26.0–34.0)
MCHC: 32.2 g/dL (ref 30.0–36.0)
MCV: 92.2 fL (ref 80.0–100.0)
Monocytes Absolute: 0.6 10*3/uL (ref 0.1–1.0)
Monocytes Relative: 10 %
Neutro Abs: 2.9 10*3/uL (ref 1.7–7.7)
Neutrophils Relative %: 54 %
Platelet Count: 206 10*3/uL (ref 150–400)
RBC: 3.87 MIL/uL — ABNORMAL LOW (ref 4.22–5.81)
RDW: 14.4 % (ref 11.5–15.5)
WBC Count: 5.4 10*3/uL (ref 4.0–10.5)
nRBC: 0 % (ref 0.0–0.2)

## 2019-08-23 LAB — CMP (CANCER CENTER ONLY)
ALT: 10 U/L (ref 0–44)
AST: 19 U/L (ref 15–41)
Albumin: 4.2 g/dL (ref 3.5–5.0)
Alkaline Phosphatase: 35 U/L — ABNORMAL LOW (ref 38–126)
Anion gap: 10 (ref 5–15)
BUN: 12 mg/dL (ref 8–23)
CO2: 26 mmol/L (ref 22–32)
Calcium: 9.5 mg/dL (ref 8.9–10.3)
Chloride: 104 mmol/L (ref 98–111)
Creatinine: 1.16 mg/dL (ref 0.61–1.24)
GFR, Est AFR Am: >60 mL/min (ref >60)
GFR, Estimated: >60 mL/min (ref >60)
Glucose, Bld: 92 mg/dL (ref 70–99)
Potassium: 4.7 mmol/L (ref 3.5–5.1)
Sodium: 140 mmol/L (ref 135–145)
Total Bilirubin: 0.3 mg/dL (ref 0.3–1.2)
Total Protein: 6.8 g/dL (ref 6.5–8.1)

## 2019-08-23 MED ORDER — LEUPROLIDE ACETATE (3 MONTH) 22.5 MG ~~LOC~~ KIT
22.5000 mg | PACK | Freq: Once | SUBCUTANEOUS | Status: AC
  Administered 2019-08-23: 22.5 mg via SUBCUTANEOUS
  Filled 2019-08-23: qty 22.5

## 2019-08-23 NOTE — Patient Instructions (Signed)

## 2019-08-23 NOTE — Progress Notes (Signed)
Hematology and Oncology Follow Up Visit  Namish Fendt [id number] [birth year] 79 y.o. 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   His initial diagnosis 1 year 2000 with localized disease.  . Prior Therapy: 1. He is status post radiation therapy and 2 years of Lupron for locally advanced prostate cancer at the time of diagnosis in 2000.  2.  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  He was started on Lupron at that time.  3. He is S/P radiation therapy directed to the right hip (30 gray in 12 fractions) concluded in 07/2011 for palliative purposes.    Current therapy:  1. Lupron 22.5 mg started in 2013.  His last Eligard given on May 25, 2019.  2. Delton See given every 3 months started in 10/2011.  Therapy discontinued due to based on patient's preference.  Interim History:  Mr. Mccadden is here for repeat evaluation.  Since the last visit, he reports feeling well without any recent complaints.  He denies any bone pain or pathological fractures.  He denies any constitutional symptoms.  Denies any hot flashes or excessive weakness.  His appetite remain excellent.  Continues to enjoy excellent quality of life.      Medications: Updated without changes.  Current Outpatient Medications  Medication Sig Dispense Refill  . aspirin EC 81 MG EC tablet Take 1 tablet (81 mg total) by mouth daily. 30 tablet 11  . Calcium Carbonate-Vitamin D (CALCIUM-VITAMIN D) 500-200 MG-UNIT per tablet Take 1 tablet by mouth daily.    . clonazePAM (KLONOPIN) 1 MG tablet Take 2 mg by mouth at bedtime.     . clopidogrel (PLAVIX) 75 MG tablet TAKE ONE TABLET BY MOUTH DAILY 90 tablet 1  . ELDERBERRY PO Take 1 tablet by mouth 2 (two) times daily.    Marland Kitchen guaiFENesin (MUCINEX PO) Take by mouth as needed.    . isosorbide mononitrate (IMDUR) 30 MG 24 hr tablet TAKE ONE  TABLET BY MOUTH DAILY 90 tablet 1  . leuprolide (LUPRON) 22.5 MG injection Inject 22.5 mg into the muscle every 3 (three) months.    . levothyroxine (SYNTHROID, LEVOTHROID) 50 MCG tablet Take 1 tablet by mouth daily.    . nitroGLYCERIN (NITROSTAT) 0.4 MG SL tablet Place 1 tablet (0.4 mg total) under the tongue every 5 (five) minutes as needed for chest pain. 25 tablet 3  . pantoprazole (PROTONIX) 40 MG tablet Take 40 mg by mouth daily.    . polyethylene glycol (MIRALAX / GLYCOLAX) packet Take 17 g by mouth daily.    . simvastatin (ZOCOR) 40 MG tablet Take 40 mg by mouth at bedtime.      Marland Kitchen venlafaxine XR (EFFEXOR-XR) 150 MG 24 hr capsule Take 150 mg by mouth daily.    . vitamin B-12 (CYANOCOBALAMIN) 1000 MCG tablet Take 1,000 mcg by mouth daily.       No current facility-administered medications for this visit.     Allergies:  Allergies  Allergen Reactions  . Penicillins Other (See Comments)    Passed out  . Phenazopyridine Hcl Hives  . Ramipril Hives       Physical Exam:   Blood pressure 108/83, pulse 92, temperature 97.8 F (36.6 C), temperature source Temporal, resp. rate 18, weight 145 lb 3.2 oz (65.9 kg), SpO2 100 %.     ECOG: 1  General appearance: Alert, awake without any distress. Head: Atraumatic without abnormalities Oropharynx: Without any thrush or ulcers. Eyes: No scleral icterus. Lymph nodes: No lymphadenopathy noted in the cervical, supraclavicular, or axillary nodes Heart:regular rate and rhythm, without any murmurs or gallops.   Lung: Clear to auscultation without any rhonchi, wheezes or dullness to percussion. Abdomin: Soft, nontender without any shifting dullness or ascites. Musculoskeletal: No clubbing or cyanosis. Neurological: No motor or sensory deficits. Skin: No rashes or lesions.       Lab Results: Lab Results  Component Value Date   WBC 6.0 05/25/2019   HGB 11.5 (L) 05/25/2019   HCT 35.2 (L) 05/25/2019   MCV 90.5 05/25/2019    PLT 175 05/25/2019         Results for NARAYANA, CONNELLEY SR. (MRN [id number]) as of 08/23/2019 08:30  Ref. Range 11/30/2018 09:33 03/02/2019 11:45 05/25/2019 11:53  Prostate Specific Ag, Serum Latest Ref Range: 0.0 - 4.0 ng/mL 0.2 0.2 0.2       Impression and Plan:   79 year old man with:   1.  Castration-sensitive advanced prostate cancer with disease to the bone since 2013.     The natural course of this disease was reviewed today and treatment options were reiterated.  He continues to have stable disease with PSA currently at 0.2.  Treatment options include therapy escalation or adding additional androgen synthesis inhibitor or androgen receptor blockade in the future.  After discussion today, we opted to continue with the current therapy with androgen deprivation and therapy escalation if he developed castration-resistant disease.  He is agreeable with this plan at this time.   2. Bone pain: None reported at this time.  He is no longer taking any pain medication.  Periodically takes Klonopin to help with sleep.  3. Bone directed therapy: He has received Xgeva in the past and he opted to defer that option for the time being.  I recommended calcium and vitamin D supplements for the time being.  4. Androgen depravation: He will receive Eligard today and repeated in 3 months.  Complications including weight gain, hot flashes were reiterated.  5. Follow up: In 3 months for repeat evaluation.  30 minutes were dedicated to the encounter today.  The time was spent on reviewing his disease status, treatment options and addressing complication related therapy.   Zola Button, MD 3/11/20218:29 AM

## 2019-08-24 ENCOUNTER — Telehealth: Payer: Self-pay

## 2019-08-24 ENCOUNTER — Telehealth: Payer: Self-pay | Admitting: Oncology

## 2019-08-24 LAB — PROSTATE-SPECIFIC AG, SERUM (LABCORP): Prostate Specific Ag, Serum: 0.3 ng/mL (ref 0.0–4.0)

## 2019-08-24 NOTE — Telephone Encounter (Signed)
-----   Message from Wyatt Portela, MD sent at 08/24/2019  9:08 AM EST ----- Please let him know his PSA is still low

## 2019-08-24 NOTE — Telephone Encounter (Signed)
Called and informed patient his PSA is still low. Patient verbalized understanding and had no further questions or concerns.

## 2019-08-24 NOTE — Telephone Encounter (Signed)
Scheduled appt per 3/11 los.  Sent a message to HIM pool to get a calendar mailed out.

## 2019-08-29 ENCOUNTER — Ambulatory Visit (INDEPENDENT_AMBULATORY_CARE_PROVIDER_SITE_OTHER): Payer: Medicare Other | Admitting: *Deleted

## 2019-08-29 DIAGNOSIS — Z950 Presence of cardiac pacemaker: Secondary | ICD-10-CM | POA: Diagnosis not present

## 2019-08-30 ENCOUNTER — Telehealth: Payer: Self-pay | Admitting: Student

## 2019-08-30 LAB — CUP PACEART REMOTE DEVICE CHECK
Battery Impedance: 4086 Ohm
Battery Remaining Longevity: 9 mo
Battery Voltage: 2.71 V
Brady Statistic AP VP Percent: 0 %
Brady Statistic AP VS Percent: 49 %
Brady Statistic AS VP Percent: 0 %
Brady Statistic AS VS Percent: 50 %
Date Time Interrogation Session: 20210318105219
Implantable Lead Implant Date: 19990408
Implantable Lead Implant Date: 19990408
Implantable Lead Location: 753859
Implantable Lead Location: 753860
Implantable Lead Model: 4035
Implantable Lead Model: 4524
Implantable Lead Serial Number: 208202
Implantable Pulse Generator Implant Date: 20081126
Lead Channel Impedance Value: 1201 Ohm
Lead Channel Impedance Value: 122 Ohm
Lead Channel Setting Pacing Amplitude: 2 V
Lead Channel Setting Pacing Amplitude: 2.5 V
Lead Channel Setting Pacing Pulse Width: 0.76 ms
Lead Channel Setting Sensing Sensitivity: 2.8 mV

## 2019-08-30 NOTE — Telephone Encounter (Signed)
Pt remote today with 9 months until ERI. Pt made aware.   He sends manual transmissions.  Monthly battery checks scheduled, and will request letter with dates be sent to him so he will know which days to send.    Legrand Como [address]" [place], PA-C  08/30/2019 3:25 PM

## 2019-08-31 ENCOUNTER — Encounter: Payer: Self-pay | Admitting: Internal Medicine

## 2019-09-05 DIAGNOSIS — D485 Neoplasm of uncertain behavior of skin: Secondary | ICD-10-CM | POA: Diagnosis not present

## 2019-09-05 DIAGNOSIS — L570 Actinic keratosis: Secondary | ICD-10-CM | POA: Diagnosis not present

## 2019-09-05 DIAGNOSIS — L821 Other seborrheic keratosis: Secondary | ICD-10-CM | POA: Diagnosis not present

## 2019-09-05 DIAGNOSIS — D224 Melanocytic nevi of scalp and neck: Secondary | ICD-10-CM | POA: Diagnosis not present

## 2019-09-05 DIAGNOSIS — L280 Lichen simplex chronicus: Secondary | ICD-10-CM | POA: Diagnosis not present

## 2019-09-12 DIAGNOSIS — E7849 Other hyperlipidemia: Secondary | ICD-10-CM | POA: Diagnosis not present

## 2019-09-12 DIAGNOSIS — I10 Essential (primary) hypertension: Secondary | ICD-10-CM | POA: Diagnosis not present

## 2019-10-01 ENCOUNTER — Ambulatory Visit (INDEPENDENT_AMBULATORY_CARE_PROVIDER_SITE_OTHER): Payer: Medicare Other | Admitting: *Deleted

## 2019-10-01 DIAGNOSIS — Z950 Presence of cardiac pacemaker: Secondary | ICD-10-CM

## 2019-10-03 LAB — CUP PACEART REMOTE DEVICE CHECK
Battery Impedance: 4146 Ohm
Battery Remaining Longevity: 9 mo
Battery Voltage: 2.7 V
Brady Statistic AP VP Percent: 0 %
Brady Statistic AP VS Percent: 49 %
Brady Statistic AS VP Percent: 0 %
Brady Statistic AS VS Percent: 51 %
Date Time Interrogation Session: 20210420104426
Implantable Lead Implant Date: 19990408
Implantable Lead Implant Date: 19990408
Implantable Lead Location: 753859
Implantable Lead Location: 753860
Implantable Lead Model: 4035
Implantable Lead Model: 4524
Implantable Lead Serial Number: 208202
Implantable Pulse Generator Implant Date: 20081126
Lead Channel Impedance Value: 1189 Ohm
Lead Channel Impedance Value: 123 Ohm
Lead Channel Pacing Threshold Amplitude: 2 V
Lead Channel Pacing Threshold Pulse Width: 0.4 ms
Lead Channel Setting Pacing Amplitude: 2 V
Lead Channel Setting Pacing Amplitude: 2.5 V
Lead Channel Setting Pacing Pulse Width: 0.76 ms
Lead Channel Setting Sensing Sensitivity: 2.8 mV

## 2019-10-03 NOTE — Addendum Note (Signed)
Addended by: Jennette Banker on: 10/03/2019 10:45 AM   Modules accepted: Level of Service

## 2019-10-03 NOTE — Progress Notes (Signed)
PPM Remote  

## 2019-10-31 ENCOUNTER — Ambulatory Visit (INDEPENDENT_AMBULATORY_CARE_PROVIDER_SITE_OTHER): Payer: Medicare Other | Admitting: *Deleted

## 2019-10-31 DIAGNOSIS — I495 Sick sinus syndrome: Secondary | ICD-10-CM

## 2019-10-31 LAB — CUP PACEART REMOTE DEVICE CHECK
Battery Impedance: 4401 Ohm
Battery Remaining Longevity: 7 mo
Battery Voltage: 2.66 V
Brady Statistic AP VP Percent: 0 %
Brady Statistic AP VS Percent: 49 %
Brady Statistic AS VP Percent: 0 %
Brady Statistic AS VS Percent: 51 %
Date Time Interrogation Session: 20210519142637
Implantable Lead Implant Date: 19990408
Implantable Lead Implant Date: 19990408
Implantable Lead Location: 753859
Implantable Lead Location: 753860
Implantable Lead Model: 4035
Implantable Lead Model: 4524
Implantable Lead Serial Number: 208202
Implantable Pulse Generator Implant Date: 20081126
Lead Channel Impedance Value: 1232 Ohm
Lead Channel Impedance Value: 124 Ohm
Lead Channel Pacing Threshold Amplitude: 2 V
Lead Channel Pacing Threshold Pulse Width: 0.4 ms
Lead Channel Setting Pacing Amplitude: 2 V
Lead Channel Setting Pacing Amplitude: 2.5 V
Lead Channel Setting Pacing Pulse Width: 0.76 ms
Lead Channel Setting Sensing Sensitivity: 4 mV

## 2019-11-02 NOTE — Progress Notes (Signed)
Remote pacemaker transmission.   

## 2019-11-12 DIAGNOSIS — I10 Essential (primary) hypertension: Secondary | ICD-10-CM | POA: Diagnosis not present

## 2019-11-12 DIAGNOSIS — I25111 Atherosclerotic heart disease of native coronary artery with angina pectoris with documented spasm: Secondary | ICD-10-CM | POA: Diagnosis not present

## 2019-11-12 DIAGNOSIS — Z87891 Personal history of nicotine dependence: Secondary | ICD-10-CM | POA: Diagnosis not present

## 2019-11-12 DIAGNOSIS — E7849 Other hyperlipidemia: Secondary | ICD-10-CM | POA: Diagnosis not present

## 2019-11-22 ENCOUNTER — Other Ambulatory Visit: Payer: Self-pay

## 2019-11-22 ENCOUNTER — Inpatient Hospital Stay (HOSPITAL_BASED_OUTPATIENT_CLINIC_OR_DEPARTMENT_OTHER): Payer: Medicare Other | Admitting: Oncology

## 2019-11-22 ENCOUNTER — Inpatient Hospital Stay: Payer: Medicare Other

## 2019-11-22 ENCOUNTER — Telehealth: Payer: Self-pay | Admitting: Oncology

## 2019-11-22 ENCOUNTER — Inpatient Hospital Stay: Payer: Medicare Other | Attending: Oncology

## 2019-11-22 VITALS — BP 130/82 | HR 98 | Temp 97.7°F | Resp 18 | Wt 146.2 lb

## 2019-11-22 DIAGNOSIS — C7951 Secondary malignant neoplasm of bone: Secondary | ICD-10-CM | POA: Insufficient documentation

## 2019-11-22 DIAGNOSIS — C61 Malignant neoplasm of prostate: Secondary | ICD-10-CM | POA: Diagnosis not present

## 2019-11-22 DIAGNOSIS — Z5111 Encounter for antineoplastic chemotherapy: Secondary | ICD-10-CM | POA: Diagnosis present

## 2019-11-22 LAB — CBC WITH DIFFERENTIAL (CANCER CENTER ONLY)
Abs Immature Granulocytes: 0.01 10*3/uL (ref 0.00–0.07)
Basophils Absolute: 0.1 10*3/uL (ref 0.0–0.1)
Basophils Relative: 1 %
Eosinophils Absolute: 0.2 10*3/uL (ref 0.0–0.5)
Eosinophils Relative: 4 %
HCT: 36.1 % — ABNORMAL LOW (ref 39.0–52.0)
Hemoglobin: 11.7 g/dL — ABNORMAL LOW (ref 13.0–17.0)
Immature Granulocytes: 0 %
Lymphocytes Relative: 27 %
Lymphs Abs: 1.4 10*3/uL (ref 0.7–4.0)
MCH: 29.6 pg (ref 26.0–34.0)
MCHC: 32.4 g/dL (ref 30.0–36.0)
MCV: 91.4 fL (ref 80.0–100.0)
Monocytes Absolute: 0.5 10*3/uL (ref 0.1–1.0)
Monocytes Relative: 10 %
Neutro Abs: 3.1 10*3/uL (ref 1.7–7.7)
Neutrophils Relative %: 58 %
Platelet Count: 190 10*3/uL (ref 150–400)
RBC: 3.95 MIL/uL — ABNORMAL LOW (ref 4.22–5.81)
RDW: 13.4 % (ref 11.5–15.5)
WBC Count: 5.3 10*3/uL (ref 4.0–10.5)
nRBC: 0 % (ref 0.0–0.2)

## 2019-11-22 LAB — CMP (CANCER CENTER ONLY)
ALT: 11 U/L (ref 0–44)
AST: 18 U/L (ref 15–41)
Albumin: 4.1 g/dL (ref 3.5–5.0)
Alkaline Phosphatase: 31 U/L — ABNORMAL LOW (ref 38–126)
Anion gap: 8 (ref 5–15)
BUN: 14 mg/dL (ref 8–23)
CO2: 26 mmol/L (ref 22–32)
Calcium: 9.5 mg/dL (ref 8.9–10.3)
Chloride: 104 mmol/L (ref 98–111)
Creatinine: 1.13 mg/dL (ref 0.61–1.24)
GFR, Est AFR Am: >60 mL/min (ref >60)
GFR, Estimated: >60 mL/min (ref >60)
Glucose, Bld: 76 mg/dL (ref 70–99)
Potassium: 4.3 mmol/L (ref 3.5–5.1)
Sodium: 138 mmol/L (ref 135–145)
Total Bilirubin: 0.3 mg/dL (ref 0.3–1.2)
Total Protein: 6.7 g/dL (ref 6.5–8.1)

## 2019-11-22 MED ORDER — LEUPROLIDE ACETATE (3 MONTH) 22.5 MG ~~LOC~~ KIT
PACK | SUBCUTANEOUS | Status: AC
  Filled 2019-11-22: qty 22.5

## 2019-11-22 MED ORDER — LEUPROLIDE ACETATE (3 MONTH) 22.5 MG ~~LOC~~ KIT
22.5000 mg | PACK | Freq: Once | SUBCUTANEOUS | Status: AC
  Administered 2019-11-22: 22.5 mg via SUBCUTANEOUS

## 2019-11-22 NOTE — Progress Notes (Signed)
Hematology and Oncology Follow Up Visit  Jalil Lorusso [id number] [birth year] 79 y.o. 11/22/2019 8:18 AM    Principle Diagnosis:  79 year old man with advanced prostate cancer with disease to the bone diagnosed in 2013.  He has castration-sensitive disease currently with initial diagnosis localized disease in year 2000. Marland Kitchen Prior Therapy: 1. He is status post radiation therapy and 2 years of Lupron for locally advanced prostate cancer at the time of diagnosis in 2000.  2.  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  He was started on Lupron at that time.  3. He is S/P radiation therapy directed to the right hip (30 gray in 12 fractions) concluded in 07/2011 for palliative purposes.    Current therapy:  1. Lupron 22.5 mg started in 2013.  He will receive Eligard today and repeated every 3 months.  2. Delton See given every 3 months started in 10/2011.  Therapy discontinued due to based on patient's preference.  Interim History:  Mr. Hensen returns today for a follow-up visit.  Since the last visit, he reports no major changes in his health.  He is currently under evaluation for possible pacemaker changes but no other symptoms.  He denies any chest pain shortness of breath or difficulty breathing.  He does report some occasional fatigue and tiredness.  He denies any bone pain or pathological fractures.  He denies any weight loss and his appetite is excellent.      Medications: Reviewed without changes. Current Outpatient Medications  Medication Sig Dispense Refill  . aspirin EC 81 MG EC tablet Take 1 tablet (81 mg total) by mouth daily. 30 tablet 11  . Calcium Carbonate-Vitamin D (CALCIUM-VITAMIN D) 500-200 MG-UNIT per tablet Take 1 tablet by mouth daily.    . clonazePAM (KLONOPIN) 1 MG tablet Take 2 mg by mouth at bedtime.     . clopidogrel (PLAVIX) 75 MG tablet TAKE ONE TABLET BY MOUTH DAILY 90 tablet 1  . ELDERBERRY PO Take 1 tablet by  mouth 2 (two) times daily.    Marland Kitchen guaiFENesin (MUCINEX PO) Take by mouth as needed.    . isosorbide mononitrate (IMDUR) 30 MG 24 hr tablet TAKE ONE TABLET BY MOUTH DAILY 90 tablet 1  . leuprolide (LUPRON) 22.5 MG injection Inject 22.5 mg into the muscle every 3 (three) months.    . levothyroxine (SYNTHROID, LEVOTHROID) 50 MCG tablet Take 1 tablet by mouth daily.    . nitroGLYCERIN (NITROSTAT) 0.4 MG SL tablet Place 1 tablet (0.4 mg total) under the tongue every 5 (five) minutes as needed for chest pain. 25 tablet 3  . pantoprazole (PROTONIX) 40 MG tablet Take 40 mg by mouth daily.    . polyethylene glycol (MIRALAX / GLYCOLAX) packet Take 17 g by mouth daily.    . simvastatin (ZOCOR) 40 MG tablet Take 40 mg by mouth at bedtime.      Marland Kitchen venlafaxine XR (EFFEXOR-XR) 150 MG 24 hr capsule Take 150 mg by mouth daily.    . vitamin B-12 (CYANOCOBALAMIN) 1000 MCG tablet Take 1,000 mcg by mouth daily.       No current facility-administered medications for this visit.     Allergies:  Allergies  Allergen Reactions  . Penicillins Other (See Comments)    Passed out  . Phenazopyridine Hcl Hives  . Ramipril Hives       Physical Exam:    Blood pressure 130/82, pulse 98, temperature 97.7 F (36.5 C),  temperature source Temporal, resp. rate 18, weight 146 lb 3.2 oz (66.3 kg), SpO2 100 %.     ECOG: 1     General appearance: Comfortable appearing without any discomfort Head: Normocephalic without any trauma Oropharynx: Mucous membranes are moist and pink without any thrush or ulcers. Eyes: Pupils are equal and round reactive to light. Lymph nodes: No cervical, supraclavicular, inguinal or axillary lymphadenopathy.   Heart:regular rate and rhythm.  S1 and S2 without leg edema. Lung: Clear without any rhonchi or wheezes.  No dullness to percussion. Abdomin: Soft, nontender, nondistended with good bowel sounds.  No hepatosplenomegaly. Musculoskeletal: No joint deformity or effusion.  Full range  of motion noted. Neurological: No deficits noted on motor, sensory and deep tendon reflex exam. Skin: No petechial rash or dryness.  Appeared moist.         Lab Results: Lab Results  Component Value Date   WBC 5.4 08/23/2019   HGB 11.5 (L) 08/23/2019   HCT 35.7 (L) 08/23/2019   MCV 92.2 08/23/2019   PLT 206 08/23/2019         Results for TEL, HEVIA SR. (MRN [id number]) as of 11/22/2019 08:20  Ref. Range 03/02/2019 11:45 05/25/2019 11:53 08/23/2019 08:19  Prostate Specific Ag, Serum Latest Ref Range: 0.0 - 4.0 ng/mL 0.2 0.2 0.3        Impression and Plan:   79 year old man with:   1.  Advanced prostate cancer with disease to the bone since 2013.  He has castration-sensitive disease.   He is currently on androgen deprivation therapy and has declined therapy escalation previously given his overall stable disease.  His PSA continues to be under control although slowly rising.  Treatment options moving forward if he develops castration-resistant disease were discussed.  Nicki Reaper chemotherapy among others.  He is agreeable to continue with this plan.  2. Bone pain: No bone pain detected at this time.  3. Bone directed therapy: I recommended continuing calcium and vitamin D supplements.  He has declined Xgeva continuation.  4. Androgen depravation: Long-term complication associated with androgen deprivation including weight gain, hot flashes and osteoporosis were reviewed.  He will receive Eligard today and repeated in 3 months.  5. Follow up: He will return for follow-up and Eligard in 3 months.  30 minutes were spent on this visit.  The time was dedicated to reviewing laboratory data, discussing treatment options and addressing complication related to his cancer therapy.   Zola Button, MD 6/10/20218:18 AM

## 2019-11-22 NOTE — Telephone Encounter (Signed)
Scheduled appt per 6/10 los.  Spoke with pt and they are aware of the appt date and time.

## 2019-11-22 NOTE — Patient Instructions (Signed)

## 2019-11-23 ENCOUNTER — Encounter: Payer: Self-pay | Admitting: *Deleted

## 2019-11-23 ENCOUNTER — Ambulatory Visit (INDEPENDENT_AMBULATORY_CARE_PROVIDER_SITE_OTHER): Payer: Medicare Other | Admitting: Cardiology

## 2019-11-23 ENCOUNTER — Telehealth: Payer: Self-pay

## 2019-11-23 ENCOUNTER — Telehealth: Payer: Self-pay | Admitting: Cardiology

## 2019-11-23 ENCOUNTER — Encounter: Payer: Self-pay | Admitting: Cardiology

## 2019-11-23 VITALS — BP 110/60 | HR 76 | Ht 69.0 in | Wt 147.2 lb

## 2019-11-23 DIAGNOSIS — R0600 Dyspnea, unspecified: Secondary | ICD-10-CM

## 2019-11-23 DIAGNOSIS — I25119 Atherosclerotic heart disease of native coronary artery with unspecified angina pectoris: Secondary | ICD-10-CM

## 2019-11-23 DIAGNOSIS — R0609 Other forms of dyspnea: Secondary | ICD-10-CM

## 2019-11-23 DIAGNOSIS — E782 Mixed hyperlipidemia: Secondary | ICD-10-CM | POA: Diagnosis not present

## 2019-11-23 DIAGNOSIS — Z950 Presence of cardiac pacemaker: Secondary | ICD-10-CM

## 2019-11-23 LAB — PROSTATE-SPECIFIC AG, SERUM (LABCORP): Prostate Specific Ag, Serum: 0.3 ng/mL (ref 0.0–4.0)

## 2019-11-23 NOTE — Telephone Encounter (Signed)
Called patient and made him aware of PSA result and that it is unchanged. Patient verbalized understanding.

## 2019-11-23 NOTE — Telephone Encounter (Signed)
percert for Lexiscan scheduled for 12/05/2019 at [facility].

## 2019-11-23 NOTE — Telephone Encounter (Signed)
-----   Message from Wyatt Portela, MD sent at 11/23/2019  8:49 AM EDT ----- Please let him know his PSA is unchanged.

## 2019-11-23 NOTE — Patient Instructions (Addendum)
Medication Instructions:   Your physician recommends that you continue on your current medications as directed. Please refer to the Current Medication list given to you today.  Labwork:  NONE  Testing/Procedures: Your physician has requested that you have a lexiscan myoview. For further information please visit [email or web address]. Please follow instruction sheet, as given.  Follow-Up:  Your physician recommends that you schedule a follow-up appointment in: pending.  Any Other Special Instructions Will Be Listed Below (If Applicable).  If you need a refill on your cardiac medications before your next appointment, please call your pharmacy. 

## 2019-11-23 NOTE — Progress Notes (Signed)
Cardiology Office Note  Date: 11/23/2019   ID: Russell Flattery Sr., DOB [birth year], MRN [id number]  PCP:  Caryl Bis, MD  Cardiologist:  Rozann Lesches, MD Electrophysiologist:  Thompson Grayer, MD   Chief Complaint  Patient presents with  . Cardiac follow-up    History of Present Illness: Russell Schliep. is a 79 y.o. male last assessed via telehealth encounter in December 2020.  He presents for a routine visit.  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  Does not feel definite angina but is more fatigued than normal, feels weak in his legs.  He follows with Dr. Rayann Heman, Medtronic pacemaker in place.  Device interrogation in May indicated normal function.  I personally reviewed his ECG today which shows normal sinus rhythm.  We went over his medications which are outlined below.  He reports compliance.  His last ischemic evaluation was in 2019 as outlined below.  Past Medical History:  Diagnosis Date  . Anemia   . CAD (coronary artery disease), native coronary artery    a. DES to proximal, mid, and distal RCA 2006. b. 12/2014 - [place] s/p  PTCA to the mid-distal previously placed RCA stents, otherwise nonobstructive CAD for continued medical therapy.  Marland Kitchen History of TIAs   . Mixed hyperlipidemia   . 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 Prostate cancer metastatic to bone ([place])   . Sinus node dysfunction [facility])    Medtronic pacemaker - Dr. Rayann Heman    Past Surgical History:  Procedure Laterality Date  . CARDIAC CATHETERIZATION N/A 01/06/2015   Procedure: Left Heart Cath and Coronary Angiography;  Surgeon: Troy Sine, MD;  Location: [facility];  Service: Cardiovascular;  Laterality: N/A;  . PACEMAKER INSERTION     MDT    Current Outpatient Medications    Medication Sig Dispense Refill  . aspirin EC 81 MG EC tablet Take 1 tablet (81 mg total) by mouth daily. 30 tablet 11  . Calcium Carbonate-Vitamin D (CALCIUM-VITAMIN D) 500-200 MG-UNIT per tablet Take 1 tablet by mouth daily.    . clonazePAM (KLONOPIN) 1 MG tablet Take 2 mg by mouth at bedtime.     . clopidogrel (PLAVIX) 75 MG tablet TAKE ONE TABLET BY MOUTH DAILY 90 tablet 1  . guaiFENesin (MUCINEX PO) Take by mouth as needed.    . isosorbide mononitrate (IMDUR) 30 MG 24 hr tablet TAKE ONE TABLET BY MOUTH DAILY 90 tablet 1  . leuprolide (LUPRON) 22.5 MG injection Inject 22.5 mg into the muscle every 3 (three) months.    . levothyroxine (SYNTHROID, LEVOTHROID) 50 MCG tablet Take 1 tablet by mouth daily.    . nitroGLYCERIN (NITROSTAT) 0.4 MG SL tablet Place 1 tablet (0.4 mg total) under the tongue every 5 (five) minutes as needed for chest pain. 25 tablet 3  . pantoprazole (PROTONIX) 40 MG tablet Take 40 mg by mouth daily.    . polyethylene glycol (MIRALAX / GLYCOLAX) packet Take 17 g by mouth daily.    . simvastatin (ZOCOR) 40 MG tablet Take 40 mg by mouth at bedtime.      Marland Kitchen venlafaxine XR (EFFEXOR-XR) 150 MG 24 hr capsule Take 150 mg by mouth daily.    . vitamin B-12 (CYANOCOBALAMIN) 1000 MCG  tablet Take 1,000 mcg by mouth daily.       No current facility-administered medications for this visit.   Allergies:  Penicillins, Phenazopyridine hcl, and Ramipril   ROS:   No palpitations or syncope.  Physical Exam: VS:  BP 110/60   Pulse 76   Ht 5\' 9"  (1.753 m)   Wt 147 lb 3.2 oz (66.8 kg)   SpO2 98%   BMI 21.74 kg/m , BMI Body mass index is 21.74 kg/m.  Wt Readings from Last 3 Encounters:  11/23/19 147 lb 3.2 oz (66.8 kg)  11/22/19 146 lb 3.2 oz (66.3 kg)  08/23/19 145 lb 3.2 oz (65.9 kg)    General: Patient appears comfortable at rest. HEENT: Conjunctiva and lids normal, wearing a mask. Neck: Supple, no elevated JVP or carotid bruits, no thyromegaly. Lungs: Clear to auscultation,  nonlabored breathing at rest. Cardiac: Regular rate and rhythm, no S3 or significant systolic murmur. Abdomen: Soft, bowel sounds present, no guarding or rebound. Extremities: No pitting edema, distal pulses 2+.  ECG:  An ECG dated 11/27/2018 was personally reviewed today and demonstrated:  Sinus rhythm with increased voltage.  Recent Labwork: 11/22/2019: ALT 11; AST 18; BUN 14; Creatinine 1.13; Hemoglobin 11.7; Platelet Count 190; Potassium 4.3; Sodium 138   Other Studies Reviewed Today:  Lexiscan Myoview 06/21/2017:  No diagnostic ST segment changes to indicate ischemia.  Small, mild intensity, mid to apical inferoseptal defect is partially reversible. Suggestive of a mild ischemic territory.  This is a low risk study.  Nuclear stress EF: 56%.  Echocardiogram 04/09/2015: Study Conclusions  - Left ventricle: The cavity size was normal. Wall thickness was normal. Systolic function was normal. The estimated ejection fraction was in the range of 55% to 60%. Indeterminate diastolic function. Wall motion was normal; there were no regional wall motion abnormalities. - Aortic valve: Mildly calcified annulus. Mildly thickened leaflets. There was mild to moderate regurgitation. The AI VC is 0.3 cm. Valve area (VTI): 3.02 cm^2. Valve area (Vmax): 2.89 cm^2. Valve area (Vmean): 2.98 cm^2. Regurgitation pressure half-time: 568 ms. - Mitral valve: There was mild regurgitation. - Left atrium: The atrium was moderately dilated. - Systemic veins: IVC is small suggesting low RA pressure and hypovolemia. - Technically difficult study.  Assessment and Plan:  1.  CAD status post DES to the proximal, mid, and distal RCA in 2006 with subsequent angioplasty of the mid and distal stents in 2016.  He reports worsening dyspnea on exertion and lack of stamina as discussed above on medical therapy.  Follow-up Lexiscan Myoview will be obtained.  If ischemic burden has increased further  we may need to discuss cardiac catheterization and eye toward revascularization.  2.  Mixed hyperlipidemia, he continues on Zocor.  3.  Sinus node dysfunction, Medtronic pacemaker in place with normal function by recent device check.  Keep follow-up with Dr. Rayann Heman.  Medication Adjustments/Labs and Tests Ordered: Current medicines are reviewed at length with the patient today.  Concerns regarding medicines are outlined above.   Tests Ordered: Orders Placed This Encounter  Procedures  . NM Myocar Multi W/Spect W/Wall Motion / EF  . EKG 12-Lead    Medication Changes: No orders of the defined types were placed in this encounter.   Disposition:  Follow up test results.  Signed, Satira Sark, MD, [facility] 11/23/2019 12:07 PM    [facility] [address], [place], [place] [address] Phone: [phone]; Fax: [phone]

## 2019-11-28 ENCOUNTER — Ambulatory Visit: Payer: Medicare Other | Admitting: *Deleted

## 2019-11-28 DIAGNOSIS — I495 Sick sinus syndrome: Secondary | ICD-10-CM

## 2019-11-28 LAB — CUP PACEART REMOTE DEVICE CHECK
Battery Impedance: 4521 Ohm
Battery Remaining Longevity: 6 mo
Battery Voltage: 2.68 V
Brady Statistic AP VP Percent: 0 %
Brady Statistic AP VS Percent: 49 %
Brady Statistic AS VP Percent: 0 %
Brady Statistic AS VS Percent: 51 %
Date Time Interrogation Session: 20210616141002
Implantable Lead Implant Date: 19990408
Implantable Lead Implant Date: 19990408
Implantable Lead Location: 753859
Implantable Lead Location: 753860
Implantable Lead Model: 4035
Implantable Lead Model: 4524
Implantable Lead Serial Number: 208202
Implantable Pulse Generator Implant Date: 20081126
Lead Channel Impedance Value: 123 Ohm
Lead Channel Impedance Value: 1256 Ohm
Lead Channel Pacing Threshold Amplitude: 2.5 V
Lead Channel Pacing Threshold Pulse Width: 0.4 ms
Lead Channel Setting Pacing Amplitude: 2 V
Lead Channel Setting Pacing Amplitude: 2.5 V
Lead Channel Setting Pacing Pulse Width: 0.76 ms
Lead Channel Setting Sensing Sensitivity: 4 mV

## 2019-11-29 NOTE — Progress Notes (Signed)
Remote pacemaker transmission.   

## 2019-12-03 ENCOUNTER — Other Ambulatory Visit: Payer: Self-pay | Admitting: Cardiology

## 2019-12-05 ENCOUNTER — Encounter (HOSPITAL_COMMUNITY)
Admission: RE | Admit: 2019-12-05 | Discharge: 2019-12-05 | Disposition: A | Discharge status: Home / Self Care | Payer: Medicare Other | Source: Ambulatory Visit | Attending: Cardiology | Admitting: Cardiology

## 2019-12-05 ENCOUNTER — Ambulatory Visit (HOSPITAL_COMMUNITY)
Admission: RE | Admit: 2019-12-05 | Discharge: 2019-12-05 | Disposition: A | Discharge status: Home / Self Care | Payer: Medicare Other | Source: Ambulatory Visit | Attending: Cardiology | Admitting: Cardiology

## 2019-12-05 ENCOUNTER — Other Ambulatory Visit: Payer: Self-pay

## 2019-12-05 ENCOUNTER — Encounter (HOSPITAL_COMMUNITY): Payer: Self-pay

## 2019-12-05 DIAGNOSIS — I25119 Atherosclerotic heart disease of native coronary artery with unspecified angina pectoris: Secondary | ICD-10-CM | POA: Diagnosis present

## 2019-12-05 DIAGNOSIS — R0600 Dyspnea, unspecified: Secondary | ICD-10-CM | POA: Diagnosis present

## 2019-12-05 DIAGNOSIS — R0609 Other forms of dyspnea: Secondary | ICD-10-CM

## 2019-12-05 HISTORY — DX: Heart failure, unspecified: I50.9

## 2019-12-05 LAB — NM MYOCAR MULTI W/SPECT W/WALL MOTION / EF
LV dias vol: 83 mL (ref 62–150)
LV sys vol: 40 mL
Peak HR: 82 {beats}/min
RATE: 0.36
Rest HR: 63 {beats}/min
SDS: 0
SRS: 5
SSS: 5
TID: 0.97

## 2019-12-05 MED ORDER — REGADENOSON 0.4 MG/5ML IV SOLN
INTRAVENOUS | Status: AC
  Administered 2019-12-05: 0.4 mg via INTRAVENOUS
  Filled 2019-12-05: qty 5

## 2019-12-05 MED ORDER — SODIUM CHLORIDE FLUSH 0.9 % IV SOLN
INTRAVENOUS | Status: AC
  Administered 2019-12-05: 10 mL via INTRAVENOUS
  Filled 2019-12-05: qty 10

## 2019-12-05 MED ORDER — TECHNETIUM TC 99M TETROFOSMIN IV KIT
30.0000 | PACK | Freq: Once | INTRAVENOUS | Status: AC | PRN
  Administered 2019-12-05: 32 via INTRAVENOUS

## 2019-12-05 MED ORDER — TECHNETIUM TC 99M TETROFOSMIN IV KIT
10.0000 | PACK | Freq: Once | INTRAVENOUS | Status: AC | PRN
  Administered 2019-12-05: 11 via INTRAVENOUS

## 2019-12-07 ENCOUNTER — Telehealth: Payer: Self-pay | Admitting: *Deleted

## 2019-12-07 NOTE — Telephone Encounter (Signed)
-----   Message from Satira Sark, MD sent at 12/05/2019  2:14 PM EDT ----- Results reviewed.  Please let him know that stress test actually looked fairly reassuring.  No large ischemic territories and LVEF low normal range.  I would continue with medical therapy, arrange follow-up in the next 6 months.

## 2019-12-07 NOTE — Telephone Encounter (Signed)
Patient's wife informed and verbalized understanding of plan. Copy sent to PCP °

## 2020-01-11 DIAGNOSIS — E7849 Other hyperlipidemia: Secondary | ICD-10-CM | POA: Diagnosis not present

## 2020-01-11 DIAGNOSIS — I25111 Atherosclerotic heart disease of native coronary artery with angina pectoris with documented spasm: Secondary | ICD-10-CM | POA: Diagnosis not present

## 2020-01-11 DIAGNOSIS — Z87891 Personal history of nicotine dependence: Secondary | ICD-10-CM | POA: Diagnosis not present

## 2020-01-11 DIAGNOSIS — I10 Essential (primary) hypertension: Secondary | ICD-10-CM | POA: Diagnosis not present

## 2020-01-17 DIAGNOSIS — Z23 Encounter for immunization: Secondary | ICD-10-CM | POA: Diagnosis not present

## 2020-01-19 ENCOUNTER — Other Ambulatory Visit: Payer: Self-pay | Admitting: Cardiology

## 2020-01-30 ENCOUNTER — Ambulatory Visit (INDEPENDENT_AMBULATORY_CARE_PROVIDER_SITE_OTHER): Payer: Medicare Other | Admitting: *Deleted

## 2020-01-30 DIAGNOSIS — I25119 Atherosclerotic heart disease of native coronary artery with unspecified angina pectoris: Secondary | ICD-10-CM

## 2020-02-01 LAB — CUP PACEART REMOTE DEVICE CHECK
Battery Impedance: 5776 Ohm
Battery Remaining Longevity: 1 mo — CL
Battery Voltage: 2.62 V
Brady Statistic AP VP Percent: 0 %
Brady Statistic AP VS Percent: 49 %
Brady Statistic AS VP Percent: 0 %
Brady Statistic AS VS Percent: 51 %
Date Time Interrogation Session: 20210820085832
Implantable Lead Implant Date: 19990408
Implantable Lead Implant Date: 19990408
Implantable Lead Location: 753859
Implantable Lead Location: 753860
Implantable Lead Model: 4035
Implantable Lead Model: 4524
Implantable Lead Serial Number: 208202
Implantable Pulse Generator Implant Date: 20081126
Lead Channel Impedance Value: 125 Ohm
Lead Channel Impedance Value: 1382 Ohm
Lead Channel Pacing Threshold Amplitude: 2.25 V
Lead Channel Pacing Threshold Pulse Width: 0.4 ms
Lead Channel Setting Pacing Amplitude: 2 V
Lead Channel Setting Pacing Amplitude: 2.5 V
Lead Channel Setting Pacing Pulse Width: 0.76 ms
Lead Channel Setting Sensing Sensitivity: 4 mV

## 2020-02-04 NOTE — Progress Notes (Signed)
Remote pacemaker transmission.   

## 2020-02-04 NOTE — Addendum Note (Signed)
Addended by: Cheri Kearns A on: 02/04/2020 02:41 PM   Modules accepted: Level of Service

## 2020-02-12 DIAGNOSIS — K219 Gastro-esophageal reflux disease without esophagitis: Secondary | ICD-10-CM | POA: Diagnosis not present

## 2020-02-12 DIAGNOSIS — I25111 Atherosclerotic heart disease of native coronary artery with angina pectoris with documented spasm: Secondary | ICD-10-CM | POA: Diagnosis not present

## 2020-02-12 DIAGNOSIS — Z87891 Personal history of nicotine dependence: Secondary | ICD-10-CM | POA: Diagnosis not present

## 2020-02-12 DIAGNOSIS — I10 Essential (primary) hypertension: Secondary | ICD-10-CM | POA: Diagnosis not present

## 2020-02-12 DIAGNOSIS — E7849 Other hyperlipidemia: Secondary | ICD-10-CM | POA: Diagnosis not present

## 2020-02-14 DIAGNOSIS — Z23 Encounter for immunization: Secondary | ICD-10-CM | POA: Diagnosis not present

## 2020-02-20 ENCOUNTER — Other Ambulatory Visit: Payer: Self-pay | Admitting: Oncology

## 2020-02-20 DIAGNOSIS — C61 Malignant neoplasm of prostate: Secondary | ICD-10-CM

## 2020-02-21 ENCOUNTER — Other Ambulatory Visit: Payer: Self-pay

## 2020-02-21 ENCOUNTER — Inpatient Hospital Stay: Payer: Medicare Other | Attending: Oncology

## 2020-02-21 ENCOUNTER — Inpatient Hospital Stay: Payer: Medicare Other

## 2020-02-21 ENCOUNTER — Inpatient Hospital Stay (HOSPITAL_BASED_OUTPATIENT_CLINIC_OR_DEPARTMENT_OTHER): Payer: Medicare Other | Admitting: Oncology

## 2020-02-21 VITALS — BP 121/73 | HR 89 | Temp 97.9°F | Resp 18 | Ht 69.0 in | Wt 144.2 lb

## 2020-02-21 DIAGNOSIS — I25119 Atherosclerotic heart disease of native coronary artery with unspecified angina pectoris: Secondary | ICD-10-CM | POA: Diagnosis not present

## 2020-02-21 DIAGNOSIS — C61 Malignant neoplasm of prostate: Secondary | ICD-10-CM | POA: Insufficient documentation

## 2020-02-21 DIAGNOSIS — C7951 Secondary malignant neoplasm of bone: Secondary | ICD-10-CM | POA: Insufficient documentation

## 2020-02-21 DIAGNOSIS — Z5111 Encounter for antineoplastic chemotherapy: Secondary | ICD-10-CM | POA: Insufficient documentation

## 2020-02-21 LAB — CMP (CANCER CENTER ONLY)
ALT: 9 U/L (ref 0–44)
AST: 16 U/L (ref 15–41)
Albumin: 4.1 g/dL (ref 3.5–5.0)
Alkaline Phosphatase: 28 U/L — ABNORMAL LOW (ref 38–126)
Anion gap: 8 (ref 5–15)
BUN: 12 mg/dL (ref 8–23)
CO2: 27 mmol/L (ref 22–32)
Calcium: 9.5 mg/dL (ref 8.9–10.3)
Chloride: 104 mmol/L (ref 98–111)
Creatinine: 1.09 mg/dL (ref 0.61–1.24)
GFR, Est AFR Am: >60 mL/min (ref >60)
GFR, Estimated: >60 mL/min (ref >60)
Glucose, Bld: 63 mg/dL — ABNORMAL LOW (ref 70–99)
Potassium: 4.2 mmol/L (ref 3.5–5.1)
Sodium: 139 mmol/L (ref 135–145)
Total Bilirubin: 0.3 mg/dL (ref 0.3–1.2)
Total Protein: 7 g/dL (ref 6.5–8.1)

## 2020-02-21 LAB — CBC WITH DIFFERENTIAL (CANCER CENTER ONLY)
Abs Immature Granulocytes: 0.01 10*3/uL (ref 0.00–0.07)
Basophils Absolute: 0 10*3/uL (ref 0.0–0.1)
Basophils Relative: 1 %
Eosinophils Absolute: 0.2 10*3/uL (ref 0.0–0.5)
Eosinophils Relative: 3 %
HCT: 35.6 % — ABNORMAL LOW (ref 39.0–52.0)
Hemoglobin: 11.6 g/dL — ABNORMAL LOW (ref 13.0–17.0)
Immature Granulocytes: 0 %
Lymphocytes Relative: 30 %
Lymphs Abs: 1.5 10*3/uL (ref 0.7–4.0)
MCH: 29.2 pg (ref 26.0–34.0)
MCHC: 32.6 g/dL (ref 30.0–36.0)
MCV: 89.7 fL (ref 80.0–100.0)
Monocytes Absolute: 0.5 10*3/uL (ref 0.1–1.0)
Monocytes Relative: 10 %
Neutro Abs: 2.8 10*3/uL (ref 1.7–7.7)
Neutrophils Relative %: 56 %
Platelet Count: 168 10*3/uL (ref 150–400)
RBC: 3.97 MIL/uL — ABNORMAL LOW (ref 4.22–5.81)
RDW: 13.6 % (ref 11.5–15.5)
WBC Count: 5 10*3/uL (ref 4.0–10.5)
nRBC: 0 % (ref 0.0–0.2)

## 2020-02-21 MED ORDER — LEUPROLIDE ACETATE (3 MONTH) 22.5 MG ~~LOC~~ KIT
22.5000 mg | PACK | Freq: Once | SUBCUTANEOUS | Status: AC
  Administered 2020-02-21: 22.5 mg via SUBCUTANEOUS

## 2020-02-21 MED ORDER — LEUPROLIDE ACETATE (3 MONTH) 22.5 MG ~~LOC~~ KIT
PACK | SUBCUTANEOUS | Status: AC
  Filled 2020-02-21: qty 22.5

## 2020-02-21 NOTE — Patient Instructions (Signed)

## 2020-02-21 NOTE — Progress Notes (Signed)
Hematology and Oncology Follow Up Visit  Russell Browning [id number] [birth year] 79 y.o. 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  Who presented in year 2000 with localized disease. . Prior Therapy: 1. He is status post radiation therapy and 2 years of Lupron for locally advanced prostate cancer at the time of diagnosis in 2000.  2.  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  He was started on Lupron at that time.  3. He is S/P radiation therapy directed to the right hip (30 gray in 12 fractions) concluded in 07/2011 for palliative purposes.   4. Xgeva given every 3 months started in 10/2011.  Therapy discontinued due to based on patient's preference.  Current therapy:   Eligard every 3 months.  Last injection given on June 10 of 2021.   Interim History:  Russell Browning is here for repeat evaluation.  Since the last visit, he reports no major changes in his health.  He denies any nausea, vomiting or abdominal pain.  He denies any worsening bone pain or pathological fractures.  Full status quality of life remain excellent.  He does have a pacemaker in place which likely require placement in the future.  He denies any palpitation, dizziness or lightheadedness.      Medications: Unchanged review. Current Outpatient Medications  Medication Sig Dispense Refill  . aspirin EC 81 MG EC tablet Take 1 tablet (81 mg total) by mouth daily. 30 tablet 11  . Calcium Carbonate-Vitamin D (CALCIUM-VITAMIN D) 500-200 MG-UNIT per tablet Take 1 tablet by mouth daily.    . clonazePAM (KLONOPIN) 1 MG tablet Take 2 mg by mouth at bedtime.     . clopidogrel (PLAVIX) 75 MG tablet TAKE ONE TABLET BY MOUTH DAILY 90 tablet 1  . guaiFENesin (MUCINEX PO) Take by mouth as needed.    . isosorbide mononitrate (IMDUR) 30 MG 24 hr tablet TAKE ONE TABLET BY MOUTH  DAILY 90 tablet 2  . leuprolide (LUPRON) 22.5 MG injection Inject 22.5 mg into the muscle every 3 (three) months.    . levothyroxine (SYNTHROID, LEVOTHROID) 50 MCG tablet Take 1 tablet by mouth daily.    . nitroGLYCERIN (NITROSTAT) 0.4 MG SL tablet Place 1 tablet (0.4 mg total) under the tongue every 5 (five) minutes as needed for chest pain. 25 tablet 3  . pantoprazole (PROTONIX) 40 MG tablet Take 40 mg by mouth daily.    . polyethylene glycol (MIRALAX / GLYCOLAX) packet Take 17 g by mouth daily.    . simvastatin (ZOCOR) 40 MG tablet Take 40 mg by mouth at bedtime.      Marland Kitchen venlafaxine XR (EFFEXOR-XR) 150 MG 24 hr capsule Take 150 mg by mouth daily.    . vitamin B-12 (CYANOCOBALAMIN) 1000 MCG tablet Take 1,000 mcg by mouth daily.       No current facility-administered medications for this visit.     Allergies:  Allergies  Allergen Reactions  . Penicillins Other (See Comments)    Passed out  . Phenazopyridine Hcl Hives  . Ramipril Hives       Physical Exam:   Blood pressure 121/73, pulse 89, temperature 97.9 F (36.6 C), temperature source Tympanic, resp. rate 18, height 5\' 9"  (1.753 m), weight 144 lb 3.2 oz (65.4 kg), SpO2 100 %.       ECOG: 1  General appearance: Alert, awake without any distress. Head: Atraumatic without abnormalities Oropharynx: Without any thrush or ulcers. Eyes: No scleral icterus. Lymph nodes: No lymphadenopathy noted in the cervical, supraclavicular, or axillary nodes Heart:regular rate and rhythm, without any murmurs or gallops.   Lung: Clear to auscultation without any rhonchi, wheezes or dullness to percussion. Abdomin: Soft, nontender without any shifting dullness or ascites. Musculoskeletal: No clubbing or cyanosis. Neurological: No motor or sensory deficits. Skin: No rashes or lesions.          Lab Results: Lab Results  Component Value Date   WBC 5.3 11/22/2019   HGB 11.7 (L) 11/22/2019   HCT 36.1 (L) 11/22/2019   MCV  91.4 11/22/2019   PLT 190 11/22/2019          Results for Russell Browning, Russell SR. (MRN [id number]) as of 02/21/2020 09:33  Ref. Range 08/23/2019 08:19 11/22/2019 08:11  Prostate Specific Ag, Serum Latest Ref Range: 0.0 - 4.0 ng/mL 0.3 0.3        Impression and Plan:   79 year old man with:   1.  Castration-sensitive prostate cancer diagnosed in 2013.  He has limited disease to the bone and lymphadenopathy.  He continues to tolerate Eligard without any major complications with PSA under excellent control.  Therapy escalation has been not discussed in the past plan at previously.  Any additional treatments including Zytiga, Xtandi and systemic chemotherapy will be deferred if he has a rise in his PSA.  He is agreeable to continue at this time.  2. Bone pain: Manageable at this time without any recent exacerbation.  3. Bone directed therapy: He has been on Xgeva in the past and has declined continuation.  I recommended calcium and vitamin D supplements.  4. Androgen depravation: This will be continued indefinitely.  He will receive it today and repeated in 3 months.  Long-term complication occluding weight gain, hot flashes and sexual dysfunction were reiterated.  5. Follow up: In 3 months for a follow-up visit.  30 minutes were dedicated to this encounter.  Time was spent on reviewing his disease status, treatment options addressing complications related to therapy.   Zola Button, MD 9/9/20219:31 AM

## 2020-02-22 ENCOUNTER — Telehealth: Payer: Self-pay

## 2020-02-22 LAB — PROSTATE-SPECIFIC AG, SERUM (LABCORP): Prostate Specific Ag, Serum: 0.3 ng/mL (ref 0.0–4.0)

## 2020-02-22 NOTE — Telephone Encounter (Signed)
-----   Message from Wyatt Portela, MD sent at 02/22/2020  8:43 AM EDT ----- Please let him know his PSA is unchanged and still low

## 2020-02-22 NOTE — Telephone Encounter (Signed)
Called patient to make him aware of PSA result. Received message saying the voice mailbox is not in service. Will try and reach patient again later today.

## 2020-02-27 ENCOUNTER — Ambulatory Visit (INDEPENDENT_AMBULATORY_CARE_PROVIDER_SITE_OTHER): Payer: Medicare Other | Admitting: *Deleted

## 2020-02-27 DIAGNOSIS — Z950 Presence of cardiac pacemaker: Secondary | ICD-10-CM | POA: Diagnosis not present

## 2020-02-27 LAB — CUP PACEART REMOTE DEVICE CHECK
Battery Impedance: 6200 Ohm
Battery Remaining Longevity: 1 mo — CL
Battery Voltage: 2.59 V
Brady Statistic AP VP Percent: 0 %
Brady Statistic AP VS Percent: 49 %
Brady Statistic AS VP Percent: 0 %
Brady Statistic AS VS Percent: 51 %
Date Time Interrogation Session: 20210915180458
Implantable Lead Implant Date: 19990408
Implantable Lead Implant Date: 19990408
Implantable Lead Location: 753859
Implantable Lead Location: 753860
Implantable Lead Model: 4035
Implantable Lead Model: 4524
Implantable Lead Serial Number: 208202
Implantable Pulse Generator Implant Date: 20081126
Lead Channel Impedance Value: 124 Ohm
Lead Channel Impedance Value: 1309 Ohm
Lead Channel Pacing Threshold Amplitude: 1.25 V
Lead Channel Pacing Threshold Pulse Width: 0.4 ms
Lead Channel Setting Pacing Amplitude: 2 V
Lead Channel Setting Pacing Amplitude: 2.5 V
Lead Channel Setting Pacing Pulse Width: 0.76 ms
Lead Channel Setting Sensing Sensitivity: 4 mV

## 2020-02-29 NOTE — Progress Notes (Signed)
Remote pacemaker transmission.   

## 2020-03-13 DIAGNOSIS — I25111 Atherosclerotic heart disease of native coronary artery with angina pectoris with documented spasm: Secondary | ICD-10-CM | POA: Diagnosis not present

## 2020-03-13 DIAGNOSIS — E7849 Other hyperlipidemia: Secondary | ICD-10-CM | POA: Diagnosis not present

## 2020-03-13 DIAGNOSIS — Z87891 Personal history of nicotine dependence: Secondary | ICD-10-CM | POA: Diagnosis not present

## 2020-03-13 DIAGNOSIS — I10 Essential (primary) hypertension: Secondary | ICD-10-CM | POA: Diagnosis not present

## 2020-03-14 ENCOUNTER — Ambulatory Visit (INDEPENDENT_AMBULATORY_CARE_PROVIDER_SITE_OTHER): Payer: Medicare Other | Admitting: Internal Medicine

## 2020-03-14 ENCOUNTER — Encounter: Payer: Self-pay | Admitting: Internal Medicine

## 2020-03-14 VITALS — BP 108/60 | HR 66 | Ht 69.0 in | Wt 148.6 lb

## 2020-03-14 DIAGNOSIS — I495 Sick sinus syndrome: Secondary | ICD-10-CM | POA: Diagnosis not present

## 2020-03-14 DIAGNOSIS — E782 Mixed hyperlipidemia: Secondary | ICD-10-CM | POA: Diagnosis not present

## 2020-03-14 DIAGNOSIS — I25119 Atherosclerotic heart disease of native coronary artery with unspecified angina pectoris: Secondary | ICD-10-CM

## 2020-03-14 LAB — CUP PACEART INCLINIC DEVICE CHECK
Date Time Interrogation Session: 20211001092518
Implantable Lead Implant Date: 19990408
Implantable Lead Implant Date: 19990408
Implantable Lead Location: 753859
Implantable Lead Location: 753860
Implantable Lead Model: 4035
Implantable Lead Model: 4524
Implantable Lead Serial Number: 208202
Implantable Pulse Generator Implant Date: 20081126

## 2020-03-14 NOTE — Progress Notes (Signed)
PCP: Caryl Bis, MD Primary Cardiologist: Dr Domenic Polite Primary EP:  Dr Rayann Heman  Russell Flattery Sr. is a 79 y.o. male who presents today for routine electrophysiology followup.  Since last being seen in our clinic, the patient reports doing very well.  His PPM has reached ERI.  He has developed symptoms of fatigue and SOB with exertion since that time.  Today, he denies symptoms of palpitations, chest pain,  lower extremity edema, dizziness, presyncope, or syncope.  The patient is otherwise without complaint today.   Past Medical History:  Diagnosis Date  . Anemia   . CAD (coronary artery disease), native coronary artery    a. DES to proximal, mid, and distal RCA 2006. b. 12/2014 - [place] s/p  PTCA to the mid-distal previously placed RCA stents, otherwise nonobstructive CAD for continued medical therapy.  . CHF (congestive heart failure) ([place])   . History of TIAs   . Mixed hyperlipidemia   . 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 Prostate cancer metastatic to bone ([place])   . Sinus node dysfunction [facility])    Medtronic pacemaker - Dr. Rayann Heman   Past Surgical History:  Procedure Laterality Date  . CARDIAC CATHETERIZATION N/A 01/06/2015   Procedure: Left Heart Cath and Coronary Angiography;  Surgeon: Troy Sine, MD;  Location: [place] [facility];  Service: Cardiovascular;  Laterality: N/A;  . PACEMAKER INSERTION     MDT    ROS- all systems are reviewed and negative except as per HPI above  Current Outpatient Medications  Medication Sig Dispense Refill  . aspirin EC 81 MG EC tablet Take 1 tablet (81 mg total) by mouth daily. 30 tablet 11  . Calcium Carbonate-Vitamin D (CALCIUM-VITAMIN D) 500-200 MG-UNIT per tablet Take 1 tablet by mouth daily.    . clonazePAM (KLONOPIN) 1 MG tablet Take 2 mg by mouth at bedtime.     . clopidogrel (PLAVIX) 75 MG tablet TAKE ONE TABLET  BY MOUTH DAILY 90 tablet 1  . guaiFENesin (MUCINEX PO) Take by mouth as needed.    . isosorbide mononitrate (IMDUR) 30 MG 24 hr tablet TAKE ONE TABLET BY MOUTH DAILY 90 tablet 2  . leuprolide (LUPRON) 22.5 MG injection Inject 22.5 mg into the muscle every 3 (three) months.    . levothyroxine (SYNTHROID, LEVOTHROID) 50 MCG tablet Take 1 tablet by mouth daily.    . nitroGLYCERIN (NITROSTAT) 0.4 MG SL tablet Place 1 tablet (0.4 mg total) under the tongue every 5 (five) minutes as needed for chest pain. 25 tablet 3  . pantoprazole (PROTONIX) 40 MG tablet Take 40 mg by mouth daily.    . polyethylene glycol (MIRALAX / GLYCOLAX) packet Take 17 g by mouth daily.    . simvastatin (ZOCOR) 40 MG tablet Take 40 mg by mouth at bedtime.      Marland Kitchen venlafaxine XR (EFFEXOR-XR) 150 MG 24 hr capsule Take 150 mg by mouth daily.    . vitamin B-12 (CYANOCOBALAMIN) 1000 MCG tablet Take 1,000 mcg by mouth daily.       No current facility-administered medications for this visit.    Physical Exam: Vitals:   03/14/20 0913  BP: 108/60  Pulse: 66  SpO2: 97%  Weight: 148 lb 9.6 oz (67.4 kg)  Height: 5\' 9"  (1.753 m)    GEN- The patient is well appearing, alert and oriented x 3  today.   Head- normocephalic, atraumatic Eyes-  Sclera clear, conjunctiva pink Ears- hearing intact Oropharynx- clear Lungs- , normal work of breathing Chest- pacemaker pocket is well healed Heart- Regular rate and rhythm  GI- soft  Extremities- no clubbing, cyanosis, or edema  Pacemaker interrogation- reviewed in detail today,  See PACEART report   Assessment and Plan:  1. Symptomatic sinus bradycardia  He has chronic atrial lead impedance issues, though the device has functioned well with this. He traditionally A paces 48% Now at [facility].  He is VVI 65 reverted and quite symptomatic. See Claudia Desanctis Art report he is not device dependant today  Atrial lead impedance is chronically low. Pt reports "Dr Caryl Comes had to do my last generator  change with bolt cutters and then lead got frayed. He put silicone on it".  The patient would prefer to have Dr Caryl Comes perform his procedure.  Given symptoms of VVI 65 programming and my current schedule which is full for several weeks, I think that it is prudent to ask Dr Caryl Comes to perform the procedure. I did speak with Dr Caryl Comes who is willing to assist with generator change and possible atrial lead revision. Risks, benefits, and alternatives to PPM pulse generator replacement were discussed in detail today.  The patient understands that risks include but are not limited to bleeding, infection, pneumothorax, perforation, tamponade, vascular damage, renal failure, MI, stroke, death,  damage to his existing leads, and lead dislodgement and wishes to proceed.  We will therefore schedule the procedure at the next available time with Dr Caryl Comes.  2. CAD No ischemic symptoms Low risk myoview from 12/05/19 reviewed with him today  3. HL Continue zocor  Risks, benefits and potential toxicities for medications prescribed and/or refilled reviewed with patient today.   Thompson Grayer MD, [facility] 03/14/2020 9:59 AM

## 2020-03-14 NOTE — Patient Instructions (Signed)
Medication Instructions:  Continue all current medications.  Labwork: none  Testing/Procedures: Generator change - staff will call to schedule  Follow-Up: To be determined   Any Other Special Instructions Will Be Listed Below (If Applicable).  If you need a refill on your cardiac medications before your next appointment, please call your pharmacy.

## 2020-03-18 ENCOUNTER — Telehealth: Payer: Self-pay

## 2020-03-18 DIAGNOSIS — R42 Dizziness and giddiness: Secondary | ICD-10-CM

## 2020-03-18 DIAGNOSIS — Z01812 Encounter for preprocedural laboratory examination: Secondary | ICD-10-CM

## 2020-03-18 DIAGNOSIS — Z950 Presence of cardiac pacemaker: Secondary | ICD-10-CM

## 2020-03-18 DIAGNOSIS — I495 Sick sinus syndrome: Secondary | ICD-10-CM

## 2020-03-18 NOTE — Telephone Encounter (Signed)
Spoke with pt and discussed need for PPM generator change and possible lead revision.  Pt agreeable to scheduling procedure for 04/11/2020.  Pt advised once appointments are scheduled will contact pt with instructions.  Pt verbalizes understanding and agrees with current plan.

## 2020-03-26 ENCOUNTER — Telehealth: Payer: Self-pay

## 2020-03-26 NOTE — Telephone Encounter (Signed)
Spoke with pt and reviewed Device Instruction letter. Pt advised copy of letter mailed to address in Epic. See letter for complete details.  Pt verbalizes understanding and agrees with current plan.

## 2020-04-08 ENCOUNTER — Other Ambulatory Visit: Payer: Self-pay

## 2020-04-08 ENCOUNTER — Other Ambulatory Visit: Payer: Medicare Other | Admitting: *Deleted

## 2020-04-08 ENCOUNTER — Other Ambulatory Visit (HOSPITAL_COMMUNITY)
Admission: RE | Admit: 2020-04-08 | Discharge: 2020-04-08 | Disposition: A | Discharge status: Home / Self Care | Payer: Medicare Other | Source: Ambulatory Visit | Attending: Internal Medicine | Admitting: Internal Medicine

## 2020-04-08 DIAGNOSIS — Z950 Presence of cardiac pacemaker: Secondary | ICD-10-CM | POA: Diagnosis not present

## 2020-04-08 DIAGNOSIS — R55 Syncope and collapse: Secondary | ICD-10-CM | POA: Diagnosis not present

## 2020-04-08 DIAGNOSIS — I495 Sick sinus syndrome: Secondary | ICD-10-CM | POA: Diagnosis not present

## 2020-04-08 DIAGNOSIS — Z01812 Encounter for preprocedural laboratory examination: Secondary | ICD-10-CM | POA: Diagnosis not present

## 2020-04-08 DIAGNOSIS — R42 Dizziness and giddiness: Secondary | ICD-10-CM | POA: Diagnosis not present

## 2020-04-08 DIAGNOSIS — Z20822 Contact with and (suspected) exposure to covid-19: Secondary | ICD-10-CM | POA: Insufficient documentation

## 2020-04-08 LAB — SARS CORONAVIRUS 2 (TAT 6-24 HRS): SARS Coronavirus 2: NEGATIVE

## 2020-04-08 LAB — CBC
Hematocrit: 32.8 % — ABNORMAL LOW (ref 37.5–51.0)
Hemoglobin: 11 g/dL — ABNORMAL LOW (ref 13.0–17.7)
MCH: 28.9 pg (ref 26.6–33.0)
MCHC: 33.5 g/dL (ref 31.5–35.7)
MCV: 86 fL (ref 79–97)
Platelets: 220 10*3/uL (ref 150–450)
RBC: 3.8 x10E6/uL — ABNORMAL LOW (ref 4.14–5.80)
RDW: 14.1 % (ref 11.6–15.4)
WBC: 6 10*3/uL (ref 3.4–10.8)

## 2020-04-08 LAB — BASIC METABOLIC PANEL
BUN/Creatinine Ratio: 14 (ref 10–24)
BUN: 14 mg/dL (ref 8–27)
CO2: 29 mmol/L (ref 20–29)
Calcium: 9.2 mg/dL (ref 8.6–10.2)
Chloride: 102 mmol/L (ref 96–106)
Creatinine, Ser: 1.02 mg/dL (ref 0.76–1.27)
GFR calc Af Amer: 81 mL/min/{1.73_m2} (ref >59)
GFR calc non Af Amer: 70 mL/min/{1.73_m2} (ref >59)
Glucose: 82 mg/dL (ref 65–99)
Potassium: 4.2 mmol/L (ref 3.5–5.2)
Sodium: 137 mmol/L (ref 134–144)

## 2020-04-10 ENCOUNTER — Telehealth: Payer: Self-pay

## 2020-04-10 NOTE — Telephone Encounter (Signed)
Spoke with pt and advised per Dr Caryl Comes labs are normal.  Chronic anemia is stable.  Pt verbalizes understanding and thanked Therapist, sports for call.

## 2020-04-10 NOTE — Telephone Encounter (Signed)
-----   Message from Deboraha Sprang, MD sent at 04/08/2020  7:29 PM EDT ----- Please Inform Patient  Labs are normal x chronic mild anemia going back about 5 yrs   Thanks

## 2020-04-11 ENCOUNTER — Ambulatory Visit (HOSPITAL_COMMUNITY)
Admission: RE | Disposition: A | Discharge status: Home / Self Care | Payer: Self-pay | Source: Home / Self Care | Attending: Internal Medicine

## 2020-04-11 ENCOUNTER — Ambulatory Visit (HOSPITAL_COMMUNITY)
Admission: RE | Admit: 2020-04-11 | Discharge: 2020-04-11 | Disposition: A | Discharge status: Home / Self Care | Payer: Medicare Other | Attending: Internal Medicine | Admitting: Internal Medicine

## 2020-04-11 ENCOUNTER — Other Ambulatory Visit: Payer: Self-pay

## 2020-04-11 DIAGNOSIS — I495 Sick sinus syndrome: Secondary | ICD-10-CM | POA: Insufficient documentation

## 2020-04-11 DIAGNOSIS — I2510 Atherosclerotic heart disease of native coronary artery without angina pectoris: Secondary | ICD-10-CM | POA: Diagnosis not present

## 2020-04-11 DIAGNOSIS — C61 Malignant neoplasm of prostate: Secondary | ICD-10-CM | POA: Insufficient documentation

## 2020-04-11 DIAGNOSIS — Z880 Allergy status to penicillin: Secondary | ICD-10-CM | POA: Diagnosis not present

## 2020-04-11 DIAGNOSIS — Z45010 Encounter for checking and testing of cardiac pacemaker pulse generator [battery]: Secondary | ICD-10-CM

## 2020-04-11 DIAGNOSIS — Z87891 Personal history of nicotine dependence: Secondary | ICD-10-CM | POA: Insufficient documentation

## 2020-04-11 DIAGNOSIS — Z955 Presence of coronary angioplasty implant and graft: Secondary | ICD-10-CM | POA: Diagnosis not present

## 2020-04-11 DIAGNOSIS — C7951 Secondary malignant neoplasm of bone: Secondary | ICD-10-CM | POA: Diagnosis not present

## 2020-04-11 HISTORY — PX: PPM GENERATOR CHANGEOUT: EP1233

## 2020-04-11 SURGERY — PPM GENERATOR CHANGEOUT

## 2020-04-11 MED ORDER — SODIUM CHLORIDE 0.9 % IV SOLN: INTRAVENOUS | Status: DC

## 2020-04-11 MED ORDER — HEPARIN (PORCINE) IN NACL 1000-0.9 UT/500ML-% IV SOLN
INTRAVENOUS | Status: AC
  Filled 2020-04-11: qty 500

## 2020-04-11 MED ORDER — ACETAMINOPHEN 325 MG PO TABS: 325.0000 mg | ORAL_TABLET | ORAL | Status: DC | PRN

## 2020-04-11 MED ORDER — HEPARIN (PORCINE) IN NACL 1000-0.9 UT/500ML-% IV SOLN
INTRAVENOUS | Status: DC | PRN
  Administered 2020-04-11: 500 mL

## 2020-04-11 MED ORDER — SODIUM CHLORIDE 0.9 % IV SOLN
INTRAVENOUS | Status: AC
  Filled 2020-04-11: qty 2

## 2020-04-11 MED ORDER — FENTANYL CITRATE (PF) 100 MCG/2ML IJ SOLN
INTRAMUSCULAR | Status: DC | PRN
  Administered 2020-04-11 (×2): 25 ug via INTRAVENOUS

## 2020-04-11 MED ORDER — FENTANYL CITRATE (PF) 100 MCG/2ML IJ SOLN
INTRAMUSCULAR | Status: AC
  Filled 2020-04-11: qty 2

## 2020-04-11 MED ORDER — MIDAZOLAM HCL 5 MG/5ML IJ SOLN
INTRAMUSCULAR | Status: AC
  Filled 2020-04-11: qty 5

## 2020-04-11 MED ORDER — VANCOMYCIN HCL IN DEXTROSE 1-5 GM/200ML-% IV SOLN
1000.0000 mg | INTRAVENOUS | Status: AC
  Administered 2020-04-11: 1000 mg via INTRAVENOUS

## 2020-04-11 MED ORDER — LIDOCAINE HCL (PF) 1 % IJ SOLN
INTRAMUSCULAR | Status: DC | PRN
  Administered 2020-04-11: 60 mL

## 2020-04-11 MED ORDER — MIDAZOLAM HCL 5 MG/5ML IJ SOLN
INTRAMUSCULAR | Status: DC | PRN
  Administered 2020-04-11 (×2): 1 mg via INTRAVENOUS

## 2020-04-11 MED ORDER — CHLORHEXIDINE GLUCONATE 4 % EX LIQD: 4.0000 "application " | Freq: Once | CUTANEOUS | Status: DC

## 2020-04-11 MED ORDER — LIDOCAINE HCL (PF) 1 % IJ SOLN
INTRAMUSCULAR | Status: AC
  Filled 2020-04-11: qty 90

## 2020-04-11 MED ORDER — SODIUM CHLORIDE 0.9 % IV SOLN
80.0000 mg | INTRAVENOUS | Status: AC
  Administered 2020-04-11: 80 mg

## 2020-04-11 MED ORDER — VANCOMYCIN HCL IN DEXTROSE 1-5 GM/200ML-% IV SOLN
INTRAVENOUS | Status: AC
  Filled 2020-04-11: qty 200

## 2020-04-11 SURGICAL SUPPLY — 7 items
CABLE SURGICAL S-101-97-12 (CABLE) ×3 IMPLANT
IPG PACE AZUR XT DR MRI W1DR01 (Pacemaker) IMPLANT
PACE AZURE XT DR MRI W1DR01 (Pacemaker) ×3 IMPLANT
PAD PRO RADIOLUCENT 2001M-C (PAD) ×3 IMPLANT
POUCH AIGIS-R ANTIBACT ICD (Mesh General) ×3 IMPLANT
POUCH AIGIS-R ANTIBACT ICD LRG (Mesh General) IMPLANT
TRAY PACEMAKER INSERTION (PACKS) ×3 IMPLANT

## 2020-04-11 NOTE — H&P (Signed)
Patient Care Team: Caryl Bis, MD as PCP - General (Unknown Physician Specialty) Satira Sark, MD as PCP - Cardiology (Cardiology) Thompson Grayer, MD as PCP - Electrophysiology (Cardiology)   HPI  Russell Flattery Sr. is a 79 y.o. male Admitted for generator replacement of device implanted remotely for sinus node dysfunction with gen change 2008 now at [facility]  Complaints of exercise intolerance--over the last few months    Hx of CAD with prior stenting; no chest pain  advance prostate cancer metastatic to bone on lupron      Records and Results Reviewed    Past Medical History:  Diagnosis Date  . Anemia   . CAD (coronary artery disease), native coronary artery    a. DES to proximal, mid, and distal RCA 2006. b. 12/2014 - [place] s/p  PTCA to the mid-distal previously placed RCA stents, otherwise nonobstructive CAD for continued medical therapy.  . CHF (congestive heart failure) ([place])   . History of TIAs   . Mixed hyperlipidemia   . 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 Prostate cancer metastatic to bone ([place])   . Sinus node dysfunction [facility])    Medtronic pacemaker - Dr. Rayann Heman    Past Surgical History:  Procedure Laterality Date  . CARDIAC CATHETERIZATION N/A 01/06/2015   Procedure: Left Heart Cath and Coronary Angiography;  Surgeon: Troy Sine, MD;  Location: [place] [facility];  Service: Cardiovascular;  Laterality: N/A;  . PACEMAKER INSERTION     MDT    Current Facility-Administered Medications  Medication Dose Route Frequency Provider Last Rate Last Admin  . 0.9 %  sodium chloride infusion   Intravenous Continuous Deboraha Sprang, MD 50 mL/hr at 04/11/20 1215 New Bag at 04/11/20 1215  . chlorhexidine (HIBICLENS) 4 % liquid 4 application  4 application Topical Once Deboraha Sprang, MD      . gentamicin (GARAMYCIN) 80 mg in sodium  chloride 0.9 % 500 mL irrigation  80 mg Irrigation On Call Deboraha Sprang, MD      . vancomycin (VANCOCIN) IVPB 1000 mg/200 mL premix  1,000 mg Intravenous On Call Deboraha Sprang, MD        Allergies  Allergen Reactions  . Penicillins Other (See Comments)    Passed out  . Phenazopyridine Hcl Hives  . Ramipril Hives      Social History   Tobacco Use  . Smoking status: Former Smoker    Packs/day: 1.00    Years: 5.00    Pack years: 5.00    Types: Cigarettes    Start date: 09/15/1951    Quit date: 06/14/1965    Years since quitting: 54.8  . Smokeless tobacco: Former Systems developer    Types: Chew    Quit date: 06/14/1978  . Tobacco comment: chewed tobacco 5 years 1PPD  Substance Use Topics  . Alcohol use: No    Alcohol/week: 0.0 standard drinks  . Drug use: No     Family History  Problem Relation Age of Onset  . Heart disease Sister   . Heart disease Father      Current Meds  Medication Sig  . aspirin EC 81 MG EC tablet Take 1 tablet (81 mg total) by mouth daily.  . Calcium Carbonate-Vitamin D (CALCIUM-VITAMIN D) 500-200 MG-UNIT per tablet Take 1 tablet by mouth daily.  . clonazePAM (  KLONOPIN) 1 MG tablet Take 1.5 mg by mouth at bedtime.   . clopidogrel (PLAVIX) 75 MG tablet TAKE ONE TABLET BY MOUTH DAILY (Patient taking differently: Take 75 mg by mouth at bedtime. )  . guaiFENesin (MUCINEX) 600 MG 12 hr tablet Take 600 mg by mouth every other day.   . isosorbide mononitrate (IMDUR) 30 MG 24 hr tablet TAKE ONE TABLET BY MOUTH DAILY (Patient taking differently: Take 30 mg by mouth daily. )  . leuprolide (LUPRON) 22.5 MG injection Inject 22.5 mg into the muscle every 3 (three) months.  . levothyroxine (SYNTHROID, LEVOTHROID) 50 MCG tablet Take 50 mcg by mouth daily before breakfast.   . Melatonin 10 MG TABS Take 10 mg by mouth at bedtime.  . mineral oil liquid Take 45 mLs by mouth See admin instructions. Every three days for constipation  . nitroGLYCERIN (NITROSTAT) 0.4 MG SL tablet  Place 1 tablet (0.4 mg total) under the tongue every 5 (five) minutes as needed for chest pain.  . pantoprazole (PROTONIX) 40 MG tablet Take 40 mg by mouth daily.  . simvastatin (ZOCOR) 40 MG tablet Take 40 mg by mouth at bedtime.    Marland Kitchen venlafaxine XR (EFFEXOR-XR) 150 MG 24 hr capsule Take 150 mg by mouth daily.  . vitamin B-12 (CYANOCOBALAMIN) 1000 MCG tablet Take 1,000 mcg by mouth daily.       Review of Systems negative except from HPI and PMH  Physical Exam BP 115/88   Pulse 67   Temp 98.7 F (37.1 C)   Ht 5\' 9"  (1.753 m)   Wt 67.1 kg   SpO2 99%   BMI 21.86 kg/m  Well developed and well nourished in no acute distress HENT normal E scleral and icterus clear Neck Supple JVP flat; carotids brisk and full Clear to ausculation Regular rate and rhythm, no murmurs gallops or rub  Device pocket well healed; without hematoma or erythema.  There is no tethering Soft with active bowel sounds No clubbing cyanosis  Edema Alert and oriented, grossly normal motor and sensory function Skin Warm and Dry    Assessment and  Plan Sinus node dysfunction  Atrial lead-- chronic low impedance >> repair   Pacemaker at [facility]   CAD prior stenting     Pt for generator replacement   Chronic low atrial impedance but normal function so do not anticipate lead replacement.  With 3rd procedure will use Aegis pouch

## 2020-04-11 NOTE — Discharge Instructions (Signed)
Dermabond will come off in next 10-14 days; if not will remove at wound check  Keep wound dry until tomorrow evening  No Driving x 4 days  Wound check in office as scheduled  Pacemaker Battery Change, Care After This sheet gives you information about how to care for yourself after your procedure. Your health care provider may also give you more specific instructions. If you have problems or questions, contact your health care provider. What can I expect after the procedure? After your procedure, it is common to have:  Pain or soreness at the site where the pacemaker was inserted.  Swelling at the site where the pacemaker was inserted. Follow these instructions at home: Incision care   Keep the incision clean and dry. ? Do not take baths, swim, or use a hot tub until your health care provider approves. ? You may shower the day after your procedure, or as directed by your health care provider. ? Pat the area dry with a clean towel. Do not rub the area. This may cause bleeding.  Follow instructions from your health care provider about how to take care of your incision. Make sure you: ? Wash your hands with soap and water before you change your bandage (dressing). If soap and water are not available, use hand sanitizer. ? Change your dressing as told by your health care provider. ? Leave stitches (sutures), skin glue, or adhesive strips in place. These skin closures may need to stay in place for 2 weeks or longer. If adhesive strip edges start to loosen and curl up, you may trim the loose edges. Do not remove adhesive strips completely unless your health care provider tells you to do that.  Check your incision area every day for signs of infection. Check for: ? More redness, swelling, or pain. ? More fluid or blood. ? Warmth. ? Pus or a bad smell. Activity  Do not lift anything that is heavier than 10 lb (4.5 kg) until your health care provider says it is okay to do so.  For the  first 2 weeks, or as long as told by your health care provider: ? Avoid lifting your left arm higher than your shoulder. ? Be gentle when you move your arms over your head. It is okay to raise your arm to comb your hair. ? Avoid strenuous exercise.  Ask your health care provider when it is okay to: ? Resume your normal activities. ? Return to work or school. ? Resume sexual activity. Eating and drinking  Eat a heart-healthy diet. This should include plenty of fresh fruits and vegetables, whole grains, low-fat dairy products, and lean protein like chicken and fish.  Limit alcohol intake to no more than 1 drink a day for non-pregnant women and 2 drinks a day for men. One drink equals 12 oz of beer, 5 oz of wine, or 1 oz of hard liquor.  Check ingredients and nutrition facts on packaged foods and beverages. Avoid the following types of food: ? Food that is high in salt (sodium). ? Food that is high in saturated fat, like full-fat dairy or red meat. ? Food that is high in trans fat, like fried food. ? Food and drinks that are high in sugar. Lifestyle  Do not use any products that contain nicotine or tobacco, such as cigarettes and e-cigarettes. If you need help quitting, ask your health care provider.  Take steps to manage and control your weight.  Get regular exercise. Aim for 150  minutes of moderate-intensity exercise (such as walking or yoga) or 75 minutes of vigorous exercise (such as running or swimming) each week.  Manage other health problems, such as diabetes or high blood pressure. Ask your health care provider how you can manage these conditions. General instructions  Do not drive for 24 hours after your procedure if you were given a medicine to help you relax (sedative).  Take over-the-counter and prescription medicines only as told by your health care provider.  Avoid putting pressure on the area where the pacemaker was placed.  If you need an MRI after your pacemaker  has been placed, be sure to tell the health care provider who orders the MRI that you have a pacemaker.  Avoid close and prolonged exposure to electrical devices that have strong magnetic fields. These include: ? Cell phones. Avoid keeping them in a pocket near the pacemaker, and try using the ear opposite the pacemaker. ? MP3 players. ? Household appliances, like microwaves. ? Metal detectors. ? Electric generators. ? High-tension wires.  Keep all follow-up visits as directed by your health care provider. This is important. Contact a health care provider if:  You have pain at the incision site that is not relieved by over-the-counter or prescription medicines.  You have any of these around your incision site or coming from it: ? More redness, swelling, or pain. ? Fluid or blood. ? Warmth to the touch. ? Pus or a bad smell.  You have a fever.  You feel brief, occasional palpitations, light-headedness, or any symptoms that you think might be related to your heart. Get help right away if:  You experience chest pain that is different from the pain at the pacemaker site.  You develop a red streak that extends above or below the incision site.  You experience shortness of breath.  You have palpitations or an irregular heartbeat.  You have light-headedness that does not go away quickly.  You faint or have dizzy spells.  Your pulse suddenly drops or increases rapidly and does not return to normal.  You begin to gain weight and your legs and ankles swell. Summary  After your procedure, it is common to have pain, soreness, and some swelling where the pacemaker was inserted.  Make sure to keep your incision clean and dry. Follow instructions from your health care provider about how to take care of your incision.  Check your incision every day for signs of infection, such as more pain or swelling, pus or a bad smell, warmth, or leaking fluid and blood.  Avoid strenuous exercise  and lifting your left arm higher than your shoulder for 2 weeks, or as long as told by your health care provider. This information is not intended to replace advice given to you by your health care provider. Make sure you discuss any questions you have with your health care provider. Document Revised: 05/13/2017 Document Reviewed: 04/22/2016 Elsevier Patient Education  2020 [employer].

## 2020-04-14 ENCOUNTER — Encounter (HOSPITAL_COMMUNITY): Payer: Self-pay | Admitting: Internal Medicine

## 2020-04-14 MED FILL — Lidocaine HCl Local Preservative Free (PF) Inj 1%: INTRAMUSCULAR | Qty: 30 | Status: AC

## 2020-04-21 DIAGNOSIS — G2581 Restless legs syndrome: Secondary | ICD-10-CM | POA: Diagnosis not present

## 2020-04-21 DIAGNOSIS — E782 Mixed hyperlipidemia: Secondary | ICD-10-CM | POA: Diagnosis not present

## 2020-04-21 DIAGNOSIS — I10 Essential (primary) hypertension: Secondary | ICD-10-CM | POA: Diagnosis not present

## 2020-04-21 DIAGNOSIS — I2510 Atherosclerotic heart disease of native coronary artery without angina pectoris: Secondary | ICD-10-CM | POA: Diagnosis not present

## 2020-04-21 DIAGNOSIS — Z0001 Encounter for general adult medical examination with abnormal findings: Secondary | ICD-10-CM | POA: Diagnosis not present

## 2020-04-21 DIAGNOSIS — E039 Hypothyroidism, unspecified: Secondary | ICD-10-CM | POA: Diagnosis not present

## 2020-04-21 DIAGNOSIS — Z6822 Body mass index (BMI) 22.0-22.9, adult: Secondary | ICD-10-CM | POA: Diagnosis not present

## 2020-04-22 ENCOUNTER — Ambulatory Visit (INDEPENDENT_AMBULATORY_CARE_PROVIDER_SITE_OTHER): Payer: Medicare Other | Admitting: Emergency Medicine

## 2020-04-22 ENCOUNTER — Other Ambulatory Visit: Payer: Self-pay

## 2020-04-22 DIAGNOSIS — I495 Sick sinus syndrome: Secondary | ICD-10-CM

## 2020-04-22 LAB — CUP PACEART INCLINIC DEVICE CHECK
Battery Remaining Longevity: 82 mo
Battery Voltage: 3.21 V
Brady Statistic AP VP Percent: 0.1 %
Brady Statistic AP VS Percent: 88.34 %
Brady Statistic AS VP Percent: 0.02 %
Brady Statistic AS VS Percent: 11.54 %
Brady Statistic RA Percent Paced: 88.53 %
Brady Statistic RV Percent Paced: 0.12 %
Date Time Interrogation Session: 20211109124552
Implantable Lead Implant Date: 19990408
Implantable Lead Implant Date: 19990408
Implantable Lead Location: 753859
Implantable Lead Location: 753860
Implantable Lead Model: 4035
Implantable Lead Model: 4524
Implantable Lead Serial Number: 208202
Implantable Pulse Generator Implant Date: 20211029
Lead Channel Impedance Value: 171 Ohm
Lead Channel Impedance Value: 57 Ohm
Lead Channel Impedance Value: 722 Ohm
Lead Channel Impedance Value: 874 Ohm
Lead Channel Pacing Threshold Amplitude: 1.25 V
Lead Channel Pacing Threshold Amplitude: 1.75 V
Lead Channel Pacing Threshold Pulse Width: 0.8 ms
Lead Channel Pacing Threshold Pulse Width: 0.8 ms
Lead Channel Sensing Intrinsic Amplitude: 10.875 mV
Lead Channel Sensing Intrinsic Amplitude: 3.625 mV
Lead Channel Setting Pacing Amplitude: 2.5 V
Lead Channel Setting Pacing Amplitude: 3.5 V
Lead Channel Setting Pacing Pulse Width: 0.8 ms
Lead Channel Setting Sensing Sensitivity: 2 mV

## 2020-04-22 NOTE — Progress Notes (Signed)
Wound check appointment. Dermabond removed. Wound without redness or edema. Incision edges approximated, wound well healed. Normal device function. Sensing, and impedances consistent with previous measurements. Atrial threshold consistent with previous values. Ventricular threshold today 2.75 @ 0.4 ms. Checked pulse width at 0.8 ms, 1.75 mV. Changed pulse width to 0.8 ms, known issue. RV 0.12% time in pacing. Medtronic present during testing.  Histogram distribution appropriate for patient and level of activity. AT/AF burden <0.1%, no episodes noted. No high ventricular rates noted.  ROV in 3 months with Dr. Caryl Comes 07/18/2020.

## 2020-05-13 DIAGNOSIS — D630 Anemia in neoplastic disease: Secondary | ICD-10-CM | POA: Diagnosis not present

## 2020-05-13 DIAGNOSIS — D692 Other nonthrombocytopenic purpura: Secondary | ICD-10-CM | POA: Diagnosis not present

## 2020-05-13 DIAGNOSIS — E7849 Other hyperlipidemia: Secondary | ICD-10-CM | POA: Diagnosis not present

## 2020-05-13 DIAGNOSIS — G2581 Restless legs syndrome: Secondary | ICD-10-CM | POA: Diagnosis not present

## 2020-05-13 DIAGNOSIS — K219 Gastro-esophageal reflux disease without esophagitis: Secondary | ICD-10-CM | POA: Diagnosis not present

## 2020-05-13 DIAGNOSIS — I25119 Atherosclerotic heart disease of native coronary artery with unspecified angina pectoris: Secondary | ICD-10-CM | POA: Diagnosis not present

## 2020-05-13 DIAGNOSIS — R4582 Worries: Secondary | ICD-10-CM | POA: Diagnosis not present

## 2020-05-13 DIAGNOSIS — C61 Malignant neoplasm of prostate: Secondary | ICD-10-CM | POA: Diagnosis not present

## 2020-05-13 DIAGNOSIS — M545 Low back pain, unspecified: Secondary | ICD-10-CM | POA: Diagnosis not present

## 2020-05-13 DIAGNOSIS — F331 Major depressive disorder, recurrent, moderate: Secondary | ICD-10-CM | POA: Diagnosis not present

## 2020-05-13 DIAGNOSIS — Z6822 Body mass index (BMI) 22.0-22.9, adult: Secondary | ICD-10-CM | POA: Diagnosis not present

## 2020-05-13 DIAGNOSIS — I10 Essential (primary) hypertension: Secondary | ICD-10-CM | POA: Diagnosis not present

## 2020-05-13 DIAGNOSIS — I25111 Atherosclerotic heart disease of native coronary artery with angina pectoris with documented spasm: Secondary | ICD-10-CM | POA: Diagnosis not present

## 2020-05-13 DIAGNOSIS — E782 Mixed hyperlipidemia: Secondary | ICD-10-CM | POA: Diagnosis not present

## 2020-05-13 DIAGNOSIS — Z23 Encounter for immunization: Secondary | ICD-10-CM | POA: Diagnosis not present

## 2020-05-15 DIAGNOSIS — C61 Malignant neoplasm of prostate: Secondary | ICD-10-CM | POA: Diagnosis not present

## 2020-05-15 DIAGNOSIS — S29012A Strain of muscle and tendon of back wall of thorax, initial encounter: Secondary | ICD-10-CM | POA: Diagnosis not present

## 2020-05-15 DIAGNOSIS — Z6822 Body mass index (BMI) 22.0-22.9, adult: Secondary | ICD-10-CM | POA: Diagnosis not present

## 2020-05-21 ENCOUNTER — Inpatient Hospital Stay: Payer: Medicare Other | Attending: Oncology

## 2020-05-21 ENCOUNTER — Inpatient Hospital Stay (HOSPITAL_BASED_OUTPATIENT_CLINIC_OR_DEPARTMENT_OTHER): Payer: Medicare Other | Admitting: Oncology

## 2020-05-21 ENCOUNTER — Other Ambulatory Visit: Payer: Self-pay

## 2020-05-21 ENCOUNTER — Inpatient Hospital Stay: Payer: Medicare Other

## 2020-05-21 VITALS — BP 132/71 | HR 94 | Temp 97.9°F | Resp 16 | Ht 69.0 in | Wt 147.0 lb

## 2020-05-21 DIAGNOSIS — C61 Malignant neoplasm of prostate: Secondary | ICD-10-CM | POA: Diagnosis not present

## 2020-05-21 DIAGNOSIS — C7951 Secondary malignant neoplasm of bone: Secondary | ICD-10-CM | POA: Insufficient documentation

## 2020-05-21 DIAGNOSIS — I25119 Atherosclerotic heart disease of native coronary artery with unspecified angina pectoris: Secondary | ICD-10-CM | POA: Diagnosis not present

## 2020-05-21 DIAGNOSIS — Z5111 Encounter for antineoplastic chemotherapy: Secondary | ICD-10-CM | POA: Insufficient documentation

## 2020-05-21 LAB — CMP (CANCER CENTER ONLY)
ALT: 9 U/L (ref 0–44)
AST: 17 U/L (ref 15–41)
Albumin: 4.1 g/dL (ref 3.5–5.0)
Alkaline Phosphatase: 30 U/L — ABNORMAL LOW (ref 38–126)
Anion gap: 6 (ref 5–15)
BUN: 12 mg/dL (ref 8–23)
CO2: 30 mmol/L (ref 22–32)
Calcium: 9.7 mg/dL (ref 8.9–10.3)
Chloride: 103 mmol/L (ref 98–111)
Creatinine: 1.13 mg/dL (ref 0.61–1.24)
GFR, Estimated: >60 mL/min (ref >60)
Glucose, Bld: 98 mg/dL (ref 70–99)
Potassium: 4.1 mmol/L (ref 3.5–5.1)
Sodium: 139 mmol/L (ref 135–145)
Total Bilirubin: 0.4 mg/dL (ref 0.3–1.2)
Total Protein: 6.9 g/dL (ref 6.5–8.1)

## 2020-05-21 LAB — CBC WITH DIFFERENTIAL (CANCER CENTER ONLY)
Abs Immature Granulocytes: 0.01 10*3/uL (ref 0.00–0.07)
Basophils Absolute: 0 10*3/uL (ref 0.0–0.1)
Basophils Relative: 1 %
Eosinophils Absolute: 0.2 10*3/uL (ref 0.0–0.5)
Eosinophils Relative: 3 %
HCT: 36 % — ABNORMAL LOW (ref 39.0–52.0)
Hemoglobin: 11.7 g/dL — ABNORMAL LOW (ref 13.0–17.0)
Immature Granulocytes: 0 %
Lymphocytes Relative: 24 %
Lymphs Abs: 1.4 10*3/uL (ref 0.7–4.0)
MCH: 29.1 pg (ref 26.0–34.0)
MCHC: 32.5 g/dL (ref 30.0–36.0)
MCV: 89.6 fL (ref 80.0–100.0)
Monocytes Absolute: 0.4 10*3/uL (ref 0.1–1.0)
Monocytes Relative: 8 %
Neutro Abs: 3.7 10*3/uL (ref 1.7–7.7)
Neutrophils Relative %: 64 %
Platelet Count: 164 10*3/uL (ref 150–400)
RBC: 4.02 MIL/uL — ABNORMAL LOW (ref 4.22–5.81)
RDW: 14.2 % (ref 11.5–15.5)
WBC Count: 5.8 10*3/uL (ref 4.0–10.5)
nRBC: 0 % (ref 0.0–0.2)

## 2020-05-21 MED ORDER — LEUPROLIDE ACETATE (3 MONTH) 22.5 MG ~~LOC~~ KIT
PACK | SUBCUTANEOUS | Status: AC
  Filled 2020-05-21: qty 22.5

## 2020-05-21 MED ORDER — LEUPROLIDE ACETATE (3 MONTH) 22.5 MG ~~LOC~~ KIT
22.5000 mg | PACK | Freq: Once | SUBCUTANEOUS | Status: AC
  Administered 2020-05-21: 22.5 mg via SUBCUTANEOUS

## 2020-05-21 NOTE — Progress Notes (Signed)
Hematology and Oncology Follow Up Visit  Russell Browning [id number] 17-Jul-1940 79 y.o. 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 He has castration-sensitive after presenting with localized disease in year 2000.    Prior Therapy: 1. He is status post radiation therapy and 2 years of Lupron for locally advanced prostate cancer at the time of diagnosis in 2000.  2.  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  He was started on Lupron at that time.  3. He is S/P radiation therapy directed to the right hip (30 gray in 12 fractions) concluded in 07/2011 for palliative purposes.   4. Xgeva given every 3 months started in 10/2011.  Therapy discontinued due to based on patient's preference.  Current therapy:   Eligard every 3 months. He will receive injection today and repeated in 3 months.   Interim History:  Russell Browning presents today for a follow-up visit. Since the last visit, he reports no major changes in his health. He continues to be active and attends to activities of daily living. He has tolerated Eligard without any complaints. Denies any nausea vomiting or abdominal pain. He denies any major changes in his performance status or activity level.      Medications: Reviewed without changes. Current Outpatient Medications  Medication Sig Dispense Refill  . aspirin EC 81 MG EC tablet Take 1 tablet (81 mg total) by mouth daily. 30 tablet 11  . Calcium Carbonate-Vitamin D (CALCIUM-VITAMIN D) 500-200 MG-UNIT per tablet Take 1 tablet by mouth daily.    . clonazePAM (KLONOPIN) 1 MG tablet Take 1.5 mg by mouth at bedtime.     . clopidogrel (PLAVIX) 75 MG tablet TAKE ONE TABLET BY MOUTH DAILY (Patient taking differently: Take 75 mg by mouth at bedtime. ) 90 tablet 1  . guaiFENesin (MUCINEX) 600 MG 12 hr tablet Take 600 mg by mouth every other day.     .  isosorbide mononitrate (IMDUR) 30 MG 24 hr tablet TAKE ONE TABLET BY MOUTH DAILY (Patient taking differently: Take 30 mg by mouth daily. ) 90 tablet 2  . leuprolide (LUPRON) 22.5 MG injection Inject 22.5 mg into the muscle every 3 (three) months.    . levothyroxine (SYNTHROID, LEVOTHROID) 50 MCG tablet Take 50 mcg by mouth daily before breakfast.     . Melatonin 10 MG TABS Take 10 mg by mouth at bedtime.    . mineral oil liquid Take 45 mLs by mouth See admin instructions. Every three days for constipation    . nitroGLYCERIN (NITROSTAT) 0.4 MG SL tablet Place 1 tablet (0.4 mg total) under the tongue every 5 (five) minutes as needed for chest pain. 25 tablet 3  . pantoprazole (PROTONIX) 40 MG tablet Take 40 mg by mouth daily.    . simvastatin (ZOCOR) 40 MG tablet Take 40 mg by mouth at bedtime.      Marland Kitchen venlafaxine XR (EFFEXOR-XR) 150 MG 24 hr capsule Take 150 mg by mouth daily.    . vitamin B-12 (CYANOCOBALAMIN) 1000 MCG tablet Take 1,000 mcg by mouth daily.       No current facility-administered medications for this visit.     Allergies:  Allergies  Allergen Reactions  . Penicillins Other (See Comments)    Passed out  . Phenazopyridine Hcl Hives  . Ramipril Hives       Physical Exam:  Blood pressure 132/71, pulse 94, temperature 97.9 F (36.6 C), temperature source Tympanic, resp. rate 16, height 5\' 9"  (1.753 m), weight 147 lb (66.7 kg), SpO2 100 %.       ECOG: 1     General appearance: Comfortable appearing without any discomfort Head: Normocephalic without any trauma Oropharynx: Mucous membranes are moist and pink without any thrush or ulcers. Eyes: Pupils are equal and round reactive to light. Lymph nodes: No cervical, supraclavicular, inguinal or axillary lymphadenopathy.   Heart:regular rate and rhythm.  S1 and S2 without leg edema. Lung: Clear without any rhonchi or wheezes.  No dullness to percussion. Abdomin: Soft, nontender, nondistended with good bowel sounds.   No hepatosplenomegaly. Musculoskeletal: No joint deformity or effusion.  Full range of motion noted. Neurological: No deficits noted on motor, sensory and deep tendon reflex exam. Skin: No petechial rash or dryness.  Appeared moist.            Lab Results: Lab Results  Component Value Date   WBC 5.8 05/21/2020   HGB 11.7 (L) 05/21/2020   HCT 36.0 (L) 05/21/2020   MCV 89.6 05/21/2020   PLT 164 05/21/2020           Results for Russell Browning, Russell SR. (MRN [id number]) as of 05/21/2020 09:40  Ref. Range 08/23/2019 08:19 11/22/2019 08:11 02/21/2020 09:21  Prostate Specific Ag, Serum Latest Ref Range: 0.0 - 4.0 ng/mL 0.3 0.3 0.3        Impression and Plan:   79 year old man with:   1. Advanced prostate cancer with disease to the bone and lymphadenopathy diagnosed in 2013. He has castration-sensitive disease with very limited metastasis.  He continues to have reasonable response to therapy with PSA that is under control for many years. Risks and benefits of continuing this treatment were discussed. Long-term complication occluding osteoporosis among others were reiterated. Therapy escalation with Russell Browning and systemic chemotherapy were discussed and we opted to proceed with androgen deprivation therapy alone.  2. Bone pain: Related to prostate cancer and currently under control.  3. Bone directed therapy: I recommended continuing calcium and vitamin D supplements. He opted against Xgeva after multiple years of injection.  4. Androgen depravation: He will receive Eligard today and repeated in 3 months.  5. Follow up: In 3 months for a follow-up visit.  30 minutes were spent on this visit. Time was dedicated to reviewing his disease status, reviewing laboratory data, treatment options and future plan of care review.   Zola Button, MD 12/8/20219:48 AM

## 2020-05-23 ENCOUNTER — Ambulatory Visit: Payer: Medicare Other

## 2020-05-23 ENCOUNTER — Ambulatory Visit: Payer: Medicare Other | Admitting: Oncology

## 2020-05-23 ENCOUNTER — Telehealth: Payer: Self-pay | Admitting: Medical Oncology

## 2020-05-23 ENCOUNTER — Other Ambulatory Visit: Payer: Medicare Other

## 2020-05-23 LAB — PROSTATE-SPECIFIC AG, SERUM (LABCORP): Prostate Specific Ag, Serum: 0.3 ng/mL (ref 0.0–4.0)

## 2020-05-23 NOTE — Telephone Encounter (Signed)
Pt.notified

## 2020-05-23 NOTE — Telephone Encounter (Signed)
-----   Message from Wyatt Portela, MD sent at 05/23/2020 11:01 AM EST ----- Please let him know his PSA is still low

## 2020-06-02 ENCOUNTER — Encounter: Payer: Self-pay | Admitting: Cardiology

## 2020-06-02 ENCOUNTER — Encounter: Payer: Self-pay | Admitting: *Deleted

## 2020-06-02 ENCOUNTER — Ambulatory Visit (INDEPENDENT_AMBULATORY_CARE_PROVIDER_SITE_OTHER): Payer: Medicare Other | Admitting: Cardiology

## 2020-06-02 VITALS — BP 120/70 | HR 79 | Ht 69.0 in | Wt 149.2 lb

## 2020-06-02 DIAGNOSIS — E782 Mixed hyperlipidemia: Secondary | ICD-10-CM

## 2020-06-02 DIAGNOSIS — I495 Sick sinus syndrome: Secondary | ICD-10-CM

## 2020-06-02 DIAGNOSIS — I25119 Atherosclerotic heart disease of native coronary artery with unspecified angina pectoris: Secondary | ICD-10-CM | POA: Diagnosis not present

## 2020-06-02 NOTE — Progress Notes (Signed)
Cardiology Office Note  Date: 06/02/2020   ID: Russell HENDERSHOTT Sr., DOB Feb 16, 1941, MRN [id number]  PCP:  Caryl Bis, MD  Cardiologist:  Rozann Lesches, MD Electrophysiologist:  Thompson Grayer, MD   Chief Complaint  Patient presents with  . Cardiac follow-up    History of Present Illness: Russell Cervi. is a 79 y.o. male last seen in June.  He presents for a routine visit.  Reports no definite angina symptoms on medical therapy, still functional with ADLs and describing NYHA class II dyspnea with most activities.  He sees Dr. Rayann Heman, Medtronic pacemaker in place.  He underwent a generator change in October.  I reviewed his medications which are stable from a cardiac perspective and outlined below.  He tells me that he did experience upper back pain, was worried that it might be angina and took nitroglycerin without effect, but ultimately realized that he had strained a back muscle after lifting.  Follow-up Lexiscan Myoview in June was low risk as outlined below.  Past Medical History:  Diagnosis Date  . Anemia   . CAD (coronary artery disease), native coronary artery    a. DES to proximal, mid, and distal RCA 2006. b. 12/2014 - [place] s/p  PTCA to the mid-distal previously placed RCA stents, otherwise nonobstructive CAD for continued medical therapy.  . CHF (congestive heart failure) ([place])   . History of TIAs   . Mixed hyperlipidemia   . 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 Prostate cancer metastatic to bone ([place])   . Sinus node dysfunction [facility])    Medtronic pacemaker - Dr. Rayann Heman    Past Surgical History:  Procedure Laterality Date  . CARDIAC CATHETERIZATION N/A 01/06/2015   Procedure: Left Heart Cath and Coronary Angiography;  Surgeon: Troy Sine, MD;  Location: [place] [facility];  Service: Cardiovascular;  Laterality: N/A;  . PACEMAKER  INSERTION     MDT  . PPM GENERATOR CHANGEOUT N/A 04/11/2020   Procedure: PPM GENERATOR CHANGEOUT;  Surgeon: Deboraha Sprang, MD;  Location: [place] [facility];  Service: Cardiovascular;  Laterality: N/A;    Current Outpatient Medications  Medication Sig Dispense Refill  . aspirin EC 81 MG EC tablet Take 1 tablet (81 mg total) by mouth daily. 30 tablet 11  . Calcium Carbonate-Vitamin D (CALCIUM-VITAMIN D) 500-200 MG-UNIT per tablet Take 1 tablet by mouth daily.    . clonazePAM (KLONOPIN) 1 MG tablet Take 1.5 mg by mouth at bedtime.    . clopidogrel (PLAVIX) 75 MG tablet TAKE ONE TABLET BY MOUTH DAILY (Patient taking differently: Take 75 mg by mouth at bedtime.) 90 tablet 1  . guaiFENesin (MUCINEX) 600 MG 12 hr tablet Take 600 mg by mouth every other day.     . isosorbide mononitrate (IMDUR) 30 MG 24 hr tablet TAKE ONE TABLET BY MOUTH DAILY (Patient taking differently: Take 30 mg by mouth daily.) 90 tablet 2  . leuprolide (LUPRON) 22.5 MG injection Inject 22.5 mg into the muscle every 3 (three) months.    . levothyroxine (SYNTHROID, LEVOTHROID) 50 MCG tablet Take 50 mcg by mouth daily before breakfast.     . Melatonin 10 MG TABS Take 10 mg by mouth at bedtime.    . mineral oil liquid Take 45 mLs by mouth See admin instructions. Every three days for constipation    . nitroGLYCERIN (  NITROSTAT) 0.4 MG SL tablet Place 1 tablet (0.4 mg total) under the tongue every 5 (five) minutes as needed for chest pain. 25 tablet 3  . pantoprazole (PROTONIX) 40 MG tablet Take 40 mg by mouth daily.    . simvastatin (ZOCOR) 40 MG tablet Take 40 mg by mouth at bedtime.    Marland Kitchen venlafaxine XR (EFFEXOR-XR) 150 MG 24 hr capsule Take 75 mg by mouth daily. Pt states taking one half tablet 75 mg by mouth daily.    . vitamin B-12 (CYANOCOBALAMIN) 1000 MCG tablet Take 1,000 mcg by mouth daily.     No current facility-administered medications for this visit.   Allergies:  Penicillins, Phenazopyridine hcl, and Ramipril    ROS: No syncope.  Physical Exam: VS:  BP 120/70   Pulse 79   Ht 5\' 9"  (1.753 m)   Wt 149 lb 3.2 oz (67.7 kg)   SpO2 98%   BMI 22.03 kg/m , BMI Body mass index is 22.03 kg/m.  Wt Readings from Last 3 Encounters:  06/02/20 149 lb 3.2 oz (67.7 kg)  05/21/20 147 lb (66.7 kg)  04/11/20 148 lb (67.1 kg)    General: Elderly male, appears comfortable at rest. HEENT: Conjunctiva and lids normal, wearing a mask. Neck: Supple, no elevated JVP or carotid bruits, no thyromegaly. Lungs: Clear to auscultation, nonlabored breathing at rest. Cardiac: Regular rate and rhythm, no S3 or significant systolic murmur, no pericardial rub. Extremities: No pitting edema.  ECG:  An ECG dated 11/23/2019 was personally reviewed today and demonstrated:  Normal sinus rhythm.  Recent Labwork: 05/21/2020: ALT 9; AST 17; BUN 12; Creatinine 1.13; Hemoglobin 11.7; Platelet Count 164; Potassium 4.1; Sodium 139   Other Studies Reviewed Today:  Echocardiogram 04/09/2015: Study Conclusions  - Left ventricle: The cavity size was normal. Wall thickness was normal. Systolic function was normal. The estimated ejection fraction was in the range of 55% to 60%. Indeterminate diastolic function. Wall motion was normal; there were no regional wall motion abnormalities. - Aortic valve: Mildly calcified annulus. Mildly thickened leaflets. There was mild to moderate regurgitation. The AI VC is 0.3 cm. Valve area (VTI): 3.02 cm^2. Valve area (Vmax): 2.89 cm^2. Valve area (Vmean): 2.98 cm^2. Regurgitation pressure half-time: 568 ms. - Mitral valve: There was mild regurgitation. - Left atrium: The atrium was moderately dilated. - Systemic veins: IVC is small suggesting low RA pressure and hypovolemia. - Technically difficult study.  Lexiscan Myoview 12/05/2019:  There was no ST segment deviation noted during stress.  The study is normal. There are no perfusion defects consistent with prior infarct  or current ischemia.  This is a low risk study.  The left ventricular ejection fraction is low normal (52%).  Assessment and Plan:  1.  CAD status post DES to the proximal, mid, and distal RCA in 2006 with subsequent angioplasty of the mid and distal stents in 2016.  He reports no definitive change in stamina, no clear angina symptoms on medical therapy.  Follow-up Lexiscan Myoview in June was low risk.  Continue observation on aspirin, Plavix, Imdur, and Zocor.  2.  Sinus node dysfunction, Medtronic pacemaker in place with follow-up by Dr. Rayann Heman.  3.  Mixed hyperlipidemia, continue Zocor with follow-up by Dr. Quillian Quince.  Requesting interval lab work.  Medication Adjustments/Labs and Tests Ordered: Current medicines are reviewed at length with the patient today.  Concerns regarding medicines are outlined above.   Tests Ordered: No orders of the defined types were placed in this encounter.  Medication Changes: No orders of the defined types were placed in this encounter.   Disposition:  Follow up 6 months in the [place] office.  Signed, Satira Sark, MD, [facility] 06/02/2020 12:07 PM    [facility] [address], [address], [place] [address] Phone: [phone]; Fax: [phone]

## 2020-06-02 NOTE — Patient Instructions (Addendum)

## 2020-06-05 ENCOUNTER — Other Ambulatory Visit: Payer: Self-pay | Admitting: Cardiology

## 2020-07-14 ENCOUNTER — Ambulatory Visit (INDEPENDENT_AMBULATORY_CARE_PROVIDER_SITE_OTHER): Payer: Medicare Other

## 2020-07-14 DIAGNOSIS — I25119 Atherosclerotic heart disease of native coronary artery with unspecified angina pectoris: Secondary | ICD-10-CM | POA: Diagnosis not present

## 2020-07-14 LAB — CUP PACEART REMOTE DEVICE CHECK
Battery Remaining Longevity: 82 mo
Battery Voltage: 3.16 V
Brady Statistic AP VP Percent: 0.15 %
Brady Statistic AP VS Percent: 77.36 %
Brady Statistic AS VP Percent: 0.01 %
Brady Statistic AS VS Percent: 22.49 %
Brady Statistic RA Percent Paced: 77.76 %
Brady Statistic RV Percent Paced: 0.16 %
Date Time Interrogation Session: 20220130180857
Implantable Lead Implant Date: 19990408
Implantable Lead Implant Date: 19990408
Implantable Lead Location: 753859
Implantable Lead Location: 753860
Implantable Lead Model: 4035
Implantable Lead Model: 4524
Implantable Lead Serial Number: 208202
Implantable Pulse Generator Implant Date: 20211029
Lead Channel Impedance Value: 171 Ohm
Lead Channel Impedance Value: 38 Ohm
Lead Channel Impedance Value: 665 Ohm
Lead Channel Impedance Value: 817 Ohm
Lead Channel Pacing Threshold Amplitude: 0.25 V
Lead Channel Pacing Threshold Amplitude: 1.75 V
Lead Channel Pacing Threshold Pulse Width: 0.4 ms
Lead Channel Pacing Threshold Pulse Width: 0.4 ms
Lead Channel Sensing Intrinsic Amplitude: 2.625 mV
Lead Channel Sensing Intrinsic Amplitude: 2.625 mV
Lead Channel Sensing Intrinsic Amplitude: 7.875 mV
Lead Channel Sensing Intrinsic Amplitude: 7.875 mV
Lead Channel Setting Pacing Amplitude: 2.5 V
Lead Channel Setting Pacing Amplitude: 4.75 V
Lead Channel Setting Pacing Pulse Width: 0.4 ms
Lead Channel Setting Sensing Sensitivity: 2 mV

## 2020-07-18 ENCOUNTER — Encounter: Payer: Self-pay | Admitting: Internal Medicine

## 2020-07-18 ENCOUNTER — Other Ambulatory Visit: Payer: Self-pay

## 2020-07-18 ENCOUNTER — Ambulatory Visit (INDEPENDENT_AMBULATORY_CARE_PROVIDER_SITE_OTHER): Payer: Medicare Other | Admitting: Internal Medicine

## 2020-07-18 VITALS — BP 122/68 | HR 84 | Ht 69.0 in | Wt 150.0 lb

## 2020-07-18 DIAGNOSIS — I495 Sick sinus syndrome: Secondary | ICD-10-CM | POA: Diagnosis not present

## 2020-07-18 DIAGNOSIS — Z950 Presence of cardiac pacemaker: Secondary | ICD-10-CM | POA: Diagnosis not present

## 2020-07-18 NOTE — Progress Notes (Signed)
Patient Care Team: Caryl Bis, MD as PCP - General (Unknown Physician Specialty) Satira Sark, MD as PCP - Cardiology (Cardiology) Thompson Grayer, MD as PCP - Electrophysiology (Cardiology)   HPI  Russell Flattery Sr. is a 80 y.o. male seen in follow-up for pacemaker generator replacement 2021  Medtronic Original implant 2008 for sinus node dysfunction.  He had a poorly functioning bipolar atrial lead and change out; it was elected however, rather than replace it to program it would be just medical history get you on your way here him unipolar.  Coronary artery disease with prior stenting.  Advanced prostate cancer metastatic to bone on Lupron  DATE TEST EF   7/16 LHC  50 % LADm-50;LADd-40 RCAd-99;RCAm-75 >stent x 2 >0   10/19 Echo   55-60 % LAE mild  6/21 Myoview 52% No perfusion defect    Date Cr K Hgb  12/21 1.13 4.1 11.7            Records and Results Reviewed   Past Medical History:  Diagnosis Date  . Anemia   . CAD (coronary artery disease), native coronary artery    a. DES to proximal, mid, and distal RCA 2006. b. 12/2014 - [place] s/p  PTCA to the mid-distal previously placed RCA stents, otherwise nonobstructive CAD for continued medical therapy.  . CHF (congestive heart failure) ([place])   . History of TIAs   . Mixed hyperlipidemia   . 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 Prostate cancer metastatic to bone ([place])   . Sinus node dysfunction [facility])    Medtronic pacemaker - Dr. Rayann Heman    Past Surgical History:  Procedure Laterality Date  . CARDIAC CATHETERIZATION N/A 01/06/2015   Procedure: Left Heart Cath and Coronary Angiography;  Surgeon: Troy Sine, MD;  Location: [place] [facility];  Service: Cardiovascular;  Laterality: N/A;  . PACEMAKER INSERTION     MDT  . PPM GENERATOR CHANGEOUT N/A 04/11/2020   Procedure: PPM GENERATOR  CHANGEOUT;  Surgeon: Deboraha Sprang, MD;  Location: [place] [facility];  Service: Cardiovascular;  Laterality: N/A;    Current Meds  Medication Sig  . aspirin EC 81 MG EC tablet Take 1 tablet (81 mg total) by mouth daily.  . Calcium Carbonate-Vitamin D (CALCIUM-VITAMIN D) 500-200 MG-UNIT per tablet Take 1 tablet by mouth daily.  . clonazePAM (KLONOPIN) 1 MG tablet Take 1.5 mg by mouth at bedtime.  . clopidogrel (PLAVIX) 75 MG tablet TAKE ONE TABLET BY MOUTH DAILY  . guaiFENesin (MUCINEX) 600 MG 12 hr tablet Take 600 mg by mouth every other day.   . isosorbide mononitrate (IMDUR) 30 MG 24 hr tablet TAKE ONE TABLET BY MOUTH DAILY  . leuprolide (LUPRON) 22.5 MG injection Inject 22.5 mg into the muscle every 3 (three) months.  . levothyroxine (SYNTHROID, LEVOTHROID) 50 MCG tablet Take 50 mcg by mouth daily before breakfast.   . Melatonin 10 MG TABS Take 10 mg by mouth at bedtime.  . mineral oil liquid Take 45 mLs by mouth See admin instructions. Every three days for constipation  . nitroGLYCERIN (NITROSTAT) 0.4 MG SL tablet Place 1 tablet (0.4 mg total) under the tongue every 5 (five) minutes as needed for chest pain.  . pantoprazole (PROTONIX) 40 MG tablet Take 40 mg by mouth daily.  . simvastatin (ZOCOR) 40 MG tablet  Take 40 mg by mouth at bedtime.  Marland Kitchen venlafaxine XR (EFFEXOR-XR) 150 MG 24 hr capsule Take 75 mg by mouth daily. Pt states taking one half tablet 75 mg by mouth daily.  . vitamin B-12 (CYANOCOBALAMIN) 1000 MCG tablet Take 1,000 mcg by mouth daily.    Allergies  Allergen Reactions  . Penicillins Other (See Comments)    Passed out  . Phenazopyridine Hcl Hives  . Ramipril Hives      Review of Systems negative except from HPI and PMH  Physical Exam BP 122/68   Pulse 84   Ht 5\' 9"  (1.753 m)   Wt 150 lb (68 kg)   SpO2 100%   BMI 22.15 kg/m  Well developed and well nourished in no acute distress HENT normal E scleral and icterus clear Neck Supple JVP flat; carotids  brisk and full Clear to ausculation Device pocket well healed; without hematoma or erythema.  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  No symptoms.  Device was reprogrammed unipolar.  No pectoral stimulation was noted.  No ischemia.  He would like to follow-up with Dr. Greggory Brandy in [place].  We will see him again at his request   Current medicines are reviewed at length with the patient today .  The patient does not  have concerns regarding medicines.

## 2020-07-18 NOTE — Patient Instructions (Signed)
Medication Instructions:  Your physician recommends that you continue on your current medications as directed. Please refer to the Current Medication list given to you today.  *If you need a refill on your cardiac medications before your next appointment, please call your pharmacy*   Lab Work: None ordered.  If you have labs (blood work) drawn today and your tests are completely normal, you will receive your results only by: Marland Kitchen MyChart Message (if you have MyChart) OR . A paper copy in the mail If you have any lab test that is abnormal or we need to change your treatment, we will call you to review the results.   Testing/Procedures: None ordered.    Follow-Up: At [facility], you and your health needs are our priority.  As part of our continuing mission to provide you with exceptional heart care, we have created designated Provider Care Teams.  These Care Teams include your primary Cardiologist (physician) and Advanced Practice Providers (APPs -  Physician Assistants and Nurse Practitioners) who all work together to provide you with the care you need, when you need it.  We recommend signing up for the patient portal called "MyChart".  Sign up information is provided on this After Visit Summary.  MyChart is used to connect with patients for Virtual Visits (Telemedicine).  Patients are able to view lab/test results, encounter notes, upcoming appointments, etc.  Non-urgent messages can be sent to your provider as well.   To learn more about what you can do with MyChart, go to [email or web address].    Your next appointment:   9 month(s)  The format for your next appointment:   In Person  Provider:   Thompson Grayer, MD

## 2020-07-21 ENCOUNTER — Telehealth: Payer: Self-pay | Admitting: Internal Medicine

## 2020-07-21 NOTE — Telephone Encounter (Signed)
Spoke to Dr. Caryl Comes and would like for patient to come into the device clinic to reprogram to the following.  RA lead ~ Turn off auto testing RA lead ~ program quarter V above threshold  Patient called and made aware of date, location and time of apt.

## 2020-07-21 NOTE — Telephone Encounter (Signed)
Patient was seen by Dr. Caryl Comes 07/18/20 and had was programmed unipolar. Per note, no pectoral stimulation was noted. Advised patient I will speak to Dr. Caryl Comes about and get back in touch with him. Agreeable to plan.

## 2020-07-21 NOTE — Telephone Encounter (Signed)
Patient called stating that over the weekend he stated having jerking pains in his left shoulder area. States that is where his pacemaker is located. Area is sore per patient.

## 2020-07-22 ENCOUNTER — Other Ambulatory Visit: Payer: Self-pay

## 2020-07-22 ENCOUNTER — Ambulatory Visit (INDEPENDENT_AMBULATORY_CARE_PROVIDER_SITE_OTHER): Payer: Medicare Other | Admitting: Emergency Medicine

## 2020-07-22 DIAGNOSIS — I495 Sick sinus syndrome: Secondary | ICD-10-CM

## 2020-07-22 LAB — CUP PACEART INCLINIC DEVICE CHECK
Battery Remaining Longevity: 155 mo
Battery Voltage: 3.16 V
Brady Statistic AP VP Percent: 0.09 %
Brady Statistic AP VS Percent: 79.83 %
Brady Statistic AS VP Percent: 0 %
Brady Statistic AS VS Percent: 20.08 %
Brady Statistic RA Percent Paced: 80.09 %
Brady Statistic RV Percent Paced: 0.1 %
Date Time Interrogation Session: 20220208120502
Implantable Lead Implant Date: 19990408
Implantable Lead Implant Date: 19990408
Implantable Lead Location: 753859
Implantable Lead Location: 753860
Implantable Lead Model: 4035
Implantable Lead Model: 4524
Implantable Lead Serial Number: 208202
Implantable Pulse Generator Implant Date: 20211029
Lead Channel Impedance Value: 190 Ohm
Lead Channel Impedance Value: 38 Ohm
Lead Channel Impedance Value: 741 Ohm
Lead Channel Impedance Value: 893 Ohm
Lead Channel Pacing Threshold Amplitude: 0.5 V
Lead Channel Pacing Threshold Amplitude: 2.5 V
Lead Channel Pacing Threshold Pulse Width: 0.4 ms
Lead Channel Pacing Threshold Pulse Width: 0.8 ms
Lead Channel Sensing Intrinsic Amplitude: 2.625 mV
Lead Channel Sensing Intrinsic Amplitude: 2.625 mV
Lead Channel Sensing Intrinsic Amplitude: 7.875 mV
Lead Channel Sensing Intrinsic Amplitude: 7.875 mV
Lead Channel Setting Pacing Amplitude: 0.75 V
Lead Channel Setting Pacing Amplitude: 5 V
Lead Channel Setting Pacing Pulse Width: 0.4 ms
Lead Channel Setting Sensing Sensitivity: 2 mV

## 2020-07-22 NOTE — Progress Notes (Signed)
Patient seen in clinic for Pacemaker adjustment per Dr. Caryl Comes.   At 07/18/20 OV with Dr. Caryl Comes RA lead reprogrammed unipolar with adaptive threshold.  Pt called 2/7 to report he had several episodes over the weekend of sharp pain in left shoulder with visable pulsation from PM.  Per Dr. Caryl Comes pt brought into clinic today to reprogram device- turn off RA adaptive capture management,  test RA threshold and reprogram 1/4 V above threshold.  Reprogramming completed by industry in office today.  RA threshold tested at 0.5v @ 0.69ms, device threshold programmed at 0.75 V@0 .58ms per instructions.  Lead polarity remains unipolar

## 2020-07-23 NOTE — Progress Notes (Signed)
Remote pacemaker transmission.   

## 2020-07-23 NOTE — Telephone Encounter (Signed)
Patient reports the pulsation in his left shoulder has resumed. Device reprogrammed with industry 07/22/20. Pulsation resolved in clinic and resumed today around 1200. Patient reports no pain but reports that muscle becomes sore after pulsation occurs for extended length of time. Scheduled for in-clinic check 07/24/20 at 1630 and will have industry present to assist.

## 2020-07-23 NOTE — Telephone Encounter (Signed)
Patient called back in today and says even with the changes his arm is still jolting today. Patient needs reassurance

## 2020-07-24 ENCOUNTER — Other Ambulatory Visit: Payer: Self-pay

## 2020-07-24 ENCOUNTER — Other Ambulatory Visit: Payer: Self-pay | Admitting: Internal Medicine

## 2020-07-24 ENCOUNTER — Ambulatory Visit (INDEPENDENT_AMBULATORY_CARE_PROVIDER_SITE_OTHER): Payer: Medicare Other | Admitting: Emergency Medicine

## 2020-07-24 DIAGNOSIS — I495 Sick sinus syndrome: Secondary | ICD-10-CM

## 2020-07-24 LAB — CUP PACEART INCLINIC DEVICE CHECK
Battery Remaining Longevity: 112 mo
Battery Voltage: 3.16 V
Brady Statistic AP VP Percent: 0.19 %
Brady Statistic AP VS Percent: 69.97 %
Brady Statistic AS VP Percent: 0.01 %
Brady Statistic AS VS Percent: 29.83 %
Brady Statistic RA Percent Paced: 70.57 %
Brady Statistic RV Percent Paced: 0.19 %
Date Time Interrogation Session: 20220210181446
Implantable Lead Implant Date: 19990408
Implantable Lead Implant Date: 19990408
Implantable Lead Location: 753859
Implantable Lead Location: 753860
Implantable Lead Model: 4035
Implantable Lead Model: 4524
Implantable Lead Serial Number: 208202
Implantable Pulse Generator Implant Date: 20211029
Lead Channel Impedance Value: 171 Ohm
Lead Channel Impedance Value: 38 Ohm
Lead Channel Impedance Value: 665 Ohm
Lead Channel Impedance Value: 817 Ohm
Lead Channel Pacing Threshold Amplitude: 0.625 V
Lead Channel Pacing Threshold Amplitude: 2.25 V
Lead Channel Pacing Threshold Pulse Width: 0.4 ms
Lead Channel Pacing Threshold Pulse Width: 0.4 ms
Lead Channel Sensing Intrinsic Amplitude: 2.625 mV
Lead Channel Sensing Intrinsic Amplitude: 3.25 mV
Lead Channel Sensing Intrinsic Amplitude: 8.125 mV
Lead Channel Sensing Intrinsic Amplitude: 9.75 mV
Lead Channel Setting Pacing Amplitude: 1.75 V
Lead Channel Setting Pacing Amplitude: 4 V
Lead Channel Setting Pacing Pulse Width: 1 ms
Lead Channel Setting Sensing Sensitivity: 2 mV

## 2020-07-24 NOTE — Progress Notes (Signed)
Pacemaker check in clinic. Pt being seen today as follow-up to previous visits, 07/22/20 and 07/18/20.  Pt seen on 2/4 to address low impedence warnings on Atrial lead.  Device was reprogrammed unipolar at that visit.  On 07/22/20, pt seen due to pectoral stimulation, device tested and reprogrammed to 1/4 V above threshold.  Pt in office today reporting continued pectoral stimulation.  Device check completed by industry and reviewed with Dr. Rayann Heman in office.  RA lead threshold tested with bipolar pacing results 1.25v@0 .21ms, lead output programmed 1.75V @ 0.8 ms bipolar pace with unipolar sensing.  Atrial sensitivity decreased from 1.77mv to 0.90 mv.   RV lead tested 2.25V @ 1.39ms, lead programmed to 4.0v @ 1.26ms for output.  No mode switch or high ventricular rates notedDevice programmed to optimize intrinsic conduction. Estimated longevity 9.5 years. Patient enrolled in remote follow-up, next scheduled check 10/13/20. Per Dr. Rayann Heman instruction pt to be seen by either Dr. Caryl Comes or Dr. Quentin Ore in office within next 2 weeks.  Message sent to scheduling to contact patient.  Patient education completed.

## 2020-07-29 ENCOUNTER — Encounter: Payer: Self-pay | Admitting: Cardiology

## 2020-07-29 ENCOUNTER — Ambulatory Visit (INDEPENDENT_AMBULATORY_CARE_PROVIDER_SITE_OTHER): Payer: Medicare Other | Admitting: Cardiology

## 2020-07-29 ENCOUNTER — Other Ambulatory Visit: Payer: Self-pay

## 2020-07-29 VITALS — BP 136/76 | HR 83 | Ht 69.0 in | Wt 150.0 lb

## 2020-07-29 DIAGNOSIS — I495 Sick sinus syndrome: Secondary | ICD-10-CM

## 2020-07-29 DIAGNOSIS — I25119 Atherosclerotic heart disease of native coronary artery with unspecified angina pectoris: Secondary | ICD-10-CM | POA: Diagnosis not present

## 2020-07-29 DIAGNOSIS — T82110A Breakdown (mechanical) of cardiac electrode, initial encounter: Secondary | ICD-10-CM

## 2020-07-29 DIAGNOSIS — I428 Other cardiomyopathies: Secondary | ICD-10-CM

## 2020-07-29 DIAGNOSIS — Z950 Presence of cardiac pacemaker: Secondary | ICD-10-CM | POA: Diagnosis not present

## 2020-07-29 LAB — CUP PACEART INCLINIC DEVICE CHECK
Brady Statistic RA Percent Paced: 75.5 %
Brady Statistic RV Percent Paced: 0.1 %
Date Time Interrogation Session: 20220204140000
Implantable Lead Implant Date: 19990408
Implantable Lead Implant Date: 19990408
Implantable Lead Location: 753859
Implantable Lead Location: 753860
Implantable Lead Model: 4035
Implantable Lead Model: 4524
Implantable Lead Serial Number: 208202
Implantable Pulse Generator Implant Date: 20211029
Lead Channel Pacing Threshold Amplitude: 1.5 V
Lead Channel Pacing Threshold Amplitude: 27.5 V
Lead Channel Pacing Threshold Pulse Width: 0.4 ms
Lead Channel Pacing Threshold Pulse Width: 0.8 ms
Lead Channel Sensing Intrinsic Amplitude: 12.6 mV
Lead Channel Sensing Intrinsic Amplitude: 4.3 mV

## 2020-07-29 MED ORDER — METOPROLOL TARTRATE 50 MG PO TABS: ORAL_TABLET | ORAL | 0 refills | Status: DC

## 2020-07-29 NOTE — Patient Instructions (Addendum)
Medication Instructions:  Your physician recommends that you continue on your current medications as directed. Please refer to the Current Medication list given to you today.  Labwork: You will get lab work today:  BMP (done)  Testing/Procedures: Your physician has requested that you have an echocardiogram. Echocardiography is a painless test that uses sound waves to create images of your heart. It provides your doctor with information about the size and shape of your heart and how well your heart's chambers and valves are working. This procedure takes approximately one hour. There are no restrictions for this procedure.  Please schedule for ECHO   Your physician has requested that you have cardiac CT. Cardiac computed tomography (CT) is a painless test that uses an x-ray machine to take clear, detailed pictures of your heart.   You will be scheduled for a cardiac CT   Follow-Up:  I will get you scheduled for a lead extraction and reinsertion.  March 18 and 21 are both possibilities  Any Other Special Instructions Will Be Listed Below (If Applicable).  If you need a refill on your cardiac medications before your next appointment, please call your pharmacy.

## 2020-07-29 NOTE — Progress Notes (Signed)
Electrophysiology Office Follow up Visit Note:    Date:  07/29/2020   ID:  Russell Flattery Sr., DOB 31-Jul-1940, MRN [id number]  PCP:  Russell Bis, Russell Browning  [facility] Cardiologist:  Russell Lesches, Russell Browning  [facility] Electrophysiologist:  Russell Grayer, Russell Browning    Interval History:    Russell Browning. is a 80 y.o. male who presents for consultation to discuss pacemaker malfunction.  Patient is followed by Dr. [name] and Dr. Caryl Browning.  The patient saw Dr. [name] via telemedicine on February 16, 2019.  At that appointment, the patient's atrial lead impedance was noted to be very low.  The patient told Dr. [name] at that visit "Dr. Caryl Browning had to do my last generator change with bolt cutters and then the lead got frayed.  He put silicone on it".  At that appointment, battery longevity was estimated at 18 months.  The patient brought Dr. [name] in clinic early March 14, 2020.  At that visit his pacemaker was noted to be at [facility].  At that appointment, the atrial lead issues were discussed with the patient.  The patient wished for Dr. Caryl Browning to perform the generator replacement.  Dr. [name] and Dr. Caryl Browning spoke at that appointment and plan for generator replacement with possible right atrial lead revision.  The patient then underwent generator replacement on April 11, 2020.  The ventricular lead showed stable electrical parameters during the generator replacement.  The atrial lead showed a very low pacing impedance.  Unipolar testing of the atrial lead showed pectoral stimulation so the the device was programmed to bipolar output with a threshold of 1.5 at 0.7 ms.  The patient then saw Dr. Caryl Browning on July 18, 2020 in clinic.  In his clinic note on February 4 Dr. Caryl Browning mentions the poorly functioning bipolar atrial lead noted during the previous generator replacement.  He mentions that he elected to program it unipolar rather than replace the bipolar lead at that time.  Patient then called the clinic on  February 7 noting that his left arm was jerking at the site of his pacemaker.  Dr. Caryl Browning changed his settings and turned off right atrial lead auto testing.  He also programmed the RA lead to be just slightly above threshold.  The patient called back in and reported that his arm was still jolting.  He reported soreness in the left arm after episodes of jolting.  Today, the patient tells me that he is very tired with any exertion. His arm is still recovering from the muscle contractions he was experiencing with unipolar atrial lead output.      Past Medical History:  Diagnosis Date  . Anemia   . CAD (coronary artery disease), native coronary artery    a. DES to proximal, mid, and distal RCA 2006. b. 12/2014 - [place] s/p  PTCA to the mid-distal previously placed RCA stents, otherwise nonobstructive CAD for continued medical therapy.  . CHF (congestive heart failure) ([place])   . History of TIAs   . Mixed hyperlipidemia   . Pacemaker lead failure    RA lead insulation defect noted at Generator change by Dr Russell Browning 2008, impedance now low, but lead functioning otherwise normally  . Prostate cancer metastatic to bone ([place])   . Sinus node dysfunction [facility])    Medtronic pacemaker - Dr. [name]    Past Surgical History:  Procedure Laterality Date  . CARDIAC CATHETERIZATION N/A 01/06/2015   Procedure: Left Heart Cath and Coronary Angiography;  Surgeon: Russell Moores  Russell Stakes, Russell Browning;  Location: [place] CV LAB;  Service: Cardiovascular;  Laterality: N/A;  . PACEMAKER INSERTION     MDT  . PPM GENERATOR CHANGEOUT N/A 04/11/2020   Procedure: PPM GENERATOR CHANGEOUT;  Surgeon: Russell Sprang, Russell Browning;  Location: [place] CV LAB;  Service: Cardiovascular;  Laterality: N/A;    Current Medications: Current Meds  Medication Sig  . aspirin EC 81 MG EC tablet Take 1 tablet (81 mg total) by mouth daily.  . Calcium Carbonate-Vitamin D (CALCIUM-VITAMIN D) 500-200 MG-UNIT per tablet Take 1 tablet by mouth daily.  .  clonazePAM (KLONOPIN) 1 MG tablet Take 1.5 mg by mouth at bedtime.  . clopidogrel (PLAVIX) 75 MG tablet TAKE ONE TABLET BY MOUTH DAILY  . guaiFENesin (MUCINEX) 600 MG 12 hr tablet Take 600 mg by mouth every other day.   . isosorbide mononitrate (IMDUR) 30 MG 24 hr tablet TAKE ONE TABLET BY MOUTH DAILY  . leuprolide (LUPRON) 22.5 MG injection Inject 22.5 mg into the muscle every 3 (three) months.  . levothyroxine (SYNTHROID, LEVOTHROID) 50 MCG tablet Take 50 mcg by mouth daily before breakfast.   . Melatonin 10 MG TABS Take 10 mg by mouth at bedtime.  . metoprolol tartrate (LOPRESSOR) 50 MG tablet Take one tablet by mouth 2 hours prior to cardiac CT  . mineral oil liquid Take 45 mLs by mouth See admin instructions. Every three days for constipation  . nitroGLYCERIN (NITROSTAT) 0.4 MG SL tablet Place 1 tablet (0.4 mg total) under the tongue every 5 (five) minutes as needed for chest pain.  . pantoprazole (PROTONIX) 40 MG tablet Take 40 mg by mouth daily.  . simvastatin (ZOCOR) 40 MG tablet Take 40 mg by mouth at bedtime.  Marland Kitchen venlafaxine XR (EFFEXOR-XR) 150 MG 24 hr capsule Take 75 mg by mouth daily. Pt states taking one half tablet 75 mg by mouth daily.  . vitamin B-12 (CYANOCOBALAMIN) 1000 MCG tablet Take 1,000 mcg by mouth daily.     Allergies:   Penicillins, Phenazopyridine hcl, and Ramipril   Social History   Socioeconomic History  . Marital status: Married    Spouse name: Not on file  . Number of children: Not on file  . Years of education: Not on file  . Highest education level: Not on file  Occupational History  . Not on file  Tobacco Use  . Smoking status: Former Smoker    Packs/day: 1.00    Years: 5.00    Pack years: 5.00    Types: Cigarettes    Start date: 09/15/1951    Quit date: 06/14/1965    Years since quitting: 55.1  . Smokeless tobacco: Former Systems developer    Types: Chew    Quit date: 06/14/1978  . Tobacco comment: chewed tobacco 5 years 1PPD  Substance and Sexual Activity  .  Alcohol use: No    Alcohol/week: 0.0 standard drinks  . Drug use: No  . Sexual activity: Not on file  Other Topics Concern  . Not on file  Social History Narrative  . Not on file   Social Determinants of Health   Financial Resource Strain: Not on file  Food Insecurity: Not on file  Transportation Needs: Not on file  Physical Activity: Not on file  Stress: Not on file  Social Connections: Not on file     Family History: [name] family history includes Heart disease in his father and sister.  ROS:   Please see the history of present illness.  All other systems reviewed and are negative.  EKGs/Labs/Other Studies Reviewed:    The following studies were reviewed today:  In clinic device interrogation personally reviewed Atrial lead is a 4524 implanted in April 1999 Ventricular lead is a 4035 implanted in April 1999 Bipolar atrial lead impedance is 38 ohms, P wave 3 mV, output 1.75 V at 0.8 Ventricular lead impedance 855 ohms, R waves 8.6 mV, output 4.75 V at 0.4 ms With atrial lead set to pace unipolar, impedance is 38 ohms. When set to pace bipolar impedance is > 3000 ohms.  Atrially pacing 70.1% Atrial unipolar and bipolar lead impedance warnings Currently, atrial lead is set to sense unipolar and pace bipolar.     EKG:  The ekg ordered today demonstrates sinus rhythm.  Single PVC.  Recent Labs: 05/21/2020: ALT 9; BUN 12; Creatinine 1.13; Hemoglobin 11.7; Platelet Count 164; Potassium 4.1; Sodium 139  Recent Lipid Panel    Component Value Date/Time   CHOL 171 01/04/2015 0431   TRIG 106 01/04/2015 0431   HDL 42 01/04/2015 0431   CHOLHDL 4.1 01/04/2015 0431   VLDL 21 01/04/2015 0431   LDLCALC 108 (H) 01/04/2015 0431    Physical Exam:    VS:  BP 136/76   Pulse 83   Ht 5\' 9"  (1.753 m)   Wt 150 lb (68 kg)   SpO2 97%   BMI 22.15 kg/m     Wt Readings from Last 3 Encounters:  07/29/20 150 lb (68 kg)  07/18/20 150 lb (68 kg)  06/02/20 149 lb 3.2 oz  (67.7 kg)     GEN: Well nourished, well developed in no acute distress HEENT: Normal NECK: No JVD; No carotid bruits LYMPHATICS: No lymphadenopathy CARDIAC: RRR, no murmurs, rubs, gallops.  Pacemaker pocket well-healed. RESPIRATORY:  Clear to auscultation without rales, wheezing or rhonchi  ABDOMEN: Soft, non-tender, non-distended MUSCULOSKELETAL:  No edema; No deformity  SKIN: Warm and dry NEUROLOGIC:  Alert and oriented x 3 PSYCHIATRIC:  Normal affect   ASSESSMENT:    1. Sinoatrial node dysfunction (HCC)   2. Cardiac pacemaker in situ   3. Failure of pacemaker lead, initial encounter   4. Coronary artery disease involving native coronary artery of native heart with angina pectoris ([place])   5. Nonischemic cardiomyopathy ([place])    PLAN:    In order of problems listed above:  1. Pacemaker lead failure Patient with a history of significant sinoatrial node dysfunction requiring dual-chamber permanent pacemaker implant in 1999.  He tells me that his heart rates were extremely low at the time of the initial implant.   Review of an operative note (generator replacement) from May 2012 shows that there was significant difficulty removing the leads from the header during a generator replacement.  Once the device was exposed during the generator replacement, there was heme noted within the atrial lead throughout its length.  After unscrewing the lead from the pacemaker header, the leads would not easily free.  He used bone cutter to disarticulate the back portion of the header and then pushed the lead pins out of the header.  Testing of the atrial lead during that procedure showed an impedance of 479 ohms.  I see mention of a lead repair performed during this procedure but I do not see specifics of what type of repair/how extensive the repair was.  I am concerned given the age of both the atrial and ventricular leads, the history of having to use a bone cutter to free the leads  from the header in  2012 and the recent electrical diagnostics of both the atrial and ventricular leads that both leads are in jeopardy of complete failure.  I am concerned that his current exercise intolerance is intermittent atrial lead failure. The atrial lead has evidence of both insulation breach and a fracture. The patient does not tolerate unipolar pacing configuration with this lead. 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  Given the patient's lead status and need for pacing, the best solution here is one that provides stable pacing function.  This can be accomplished in 1 of 2 ways.    The first way would be to implant a new pacemaker system and abandon the existing system. This can be accomplished via the left subclavian vein if patent or the right subclavian vein.  I do not think a leadless pacemaker is a good option in him given he has been symptomatic in the past with VVI pacing.    The second way to accomplish the goal of a stable pacing system would be to extract the current pacemaker system and replace with a new dual-chamber pacemaker. Extraction of a 80 year old pacemaker system is complicated and Browning with risk of vascular injury, tamponade, bleeding, hematoma and death. His leads are passive which makes the extraction difficult and there is a significant amount of redundant lead in the RA before the RV lead courses across the TV increasing the probability of lead-atrial adhesions.   Today we had an extensive discussion about the various options.  To start the evaluation, I would like to order a CT scan of the chest (gated cardiac).  The contrast bolus should be timed to evaluate the patency of the left subclavian, innominate, SVC.  The images should be gated to help evaluate the relationship between the lead bodies and the wall of the RA/RV/SVC.    Given the complexity of his situation, I would like to get the CT chest and echo results and then see  him back in clinic to discuss next steps.  I called the patient this evening and discussed the above and advised him that we would be reaching out to discuss scheduling a clinic appointment after his CT and echo.    Medication Adjustments/Labs and Tests Ordered: Current medicines are reviewed at length with the patient today.  Concerns regarding medicines are outlined above.  Orders Placed This Encounter  Procedures  . CT CORONARY MORPH W/CTA COR W/SCORE W/CA W/CM &/OR WO/CM  . Basic metabolic panel  . EKG 12-Lead  . ECHOCARDIOGRAM COMPLETE   Meds ordered this encounter  Medications  . metoprolol tartrate (LOPRESSOR) 50 MG tablet    Sig: Take one tablet by mouth 2 hours prior to cardiac CT    Dispense:  1 tablet    Refill:  0     Signed, Lars Mage, Russell Browning, [facility]  07/29/2020 6:31 PM    Electrophysiology [facility]

## 2020-07-30 ENCOUNTER — Ambulatory Visit (INDEPENDENT_AMBULATORY_CARE_PROVIDER_SITE_OTHER): Payer: Medicare Other

## 2020-07-30 ENCOUNTER — Other Ambulatory Visit: Payer: Self-pay | Admitting: Cardiology

## 2020-07-30 DIAGNOSIS — I428 Other cardiomyopathies: Secondary | ICD-10-CM

## 2020-07-30 DIAGNOSIS — I495 Sick sinus syndrome: Secondary | ICD-10-CM

## 2020-07-30 LAB — BASIC METABOLIC PANEL
BUN/Creatinine Ratio: 13 (ref 10–24)
BUN: 13 mg/dL (ref 8–27)
CO2: 23 mmol/L (ref 20–29)
Calcium: 9.2 mg/dL (ref 8.6–10.2)
Chloride: 99 mmol/L (ref 96–106)
Creatinine, Ser: 1.03 mg/dL (ref 0.76–1.27)
GFR calc Af Amer: 80 mL/min/{1.73_m2} (ref >59)
GFR calc non Af Amer: 69 mL/min/{1.73_m2} (ref >59)
Glucose: 93 mg/dL (ref 65–99)
Potassium: 4.4 mmol/L (ref 3.5–5.2)
Sodium: 138 mmol/L (ref 134–144)

## 2020-07-30 LAB — ECHOCARDIOGRAM COMPLETE
AR max vel: 2.25 cm2
AV Area VTI: 2.32 cm2
AV Area mean vel: 2.1 cm2
AV Mean grad: 3.9 mmHg
AV Peak grad: 7.9 mmHg
Ao pk vel: 1.4 m/s
Area-P 1/2: 2.39 cm2
Calc EF: 49.5 %
MV M vel: 4.26 m/s
MV Peak grad: 72.7 mmHg
P 1/2 time: 676 msec
S' Lateral: 4.2 cm
Single Plane A2C EF: 56.2 %
Single Plane A4C EF: 38.1 %

## 2020-08-04 ENCOUNTER — Telehealth (HOSPITAL_COMMUNITY): Payer: Self-pay | Admitting: Emergency Medicine

## 2020-08-04 NOTE — Telephone Encounter (Signed)
Reaching out to patient to offer assistance regarding upcoming cardiac imaging study; pt verbalizes understanding of appt date/time, parking situation and where to check in, pre-test NPO status and medications ordered, and verified current allergies; name and call back number provided for further questions should they arise Bhavana Kady RN Navigator Cardiac Imaging [facility] [phone] office [phone] cell 

## 2020-08-06 ENCOUNTER — Other Ambulatory Visit: Payer: Self-pay

## 2020-08-06 ENCOUNTER — Ambulatory Visit (HOSPITAL_COMMUNITY)
Admission: RE | Admit: 2020-08-06 | Discharge: 2020-08-06 | Disposition: A | Discharge status: Home / Self Care | Payer: Medicare Other | Source: Ambulatory Visit | Attending: Cardiology | Admitting: Cardiology

## 2020-08-06 ENCOUNTER — Other Ambulatory Visit: Payer: Self-pay | Admitting: Cardiology

## 2020-08-06 ENCOUNTER — Other Ambulatory Visit (HOSPITAL_COMMUNITY): Payer: Self-pay | Admitting: Emergency Medicine

## 2020-08-06 DIAGNOSIS — Z950 Presence of cardiac pacemaker: Secondary | ICD-10-CM

## 2020-08-06 DIAGNOSIS — T82110A Breakdown (mechanical) of cardiac electrode, initial encounter: Secondary | ICD-10-CM

## 2020-08-06 DIAGNOSIS — I495 Sick sinus syndrome: Secondary | ICD-10-CM

## 2020-08-06 DIAGNOSIS — I428 Other cardiomyopathies: Secondary | ICD-10-CM

## 2020-08-06 DIAGNOSIS — I25119 Atherosclerotic heart disease of native coronary artery with unspecified angina pectoris: Secondary | ICD-10-CM

## 2020-08-06 MED ORDER — IOHEXOL 300 MG/ML  SOLN
100.0000 mL | Freq: Once | INTRAMUSCULAR | Status: AC | PRN
  Administered 2020-08-06: 100 mL via INTRAVENOUS

## 2020-08-06 NOTE — Progress Notes (Signed)
Order changed from coronary morph CTA to CT chest w/ contrast.  Marchia Bond RN Navigator Cardiac Imaging [facility] [phone] Office  [phone] Cell

## 2020-08-11 ENCOUNTER — Other Ambulatory Visit: Payer: Self-pay | Admitting: Cardiology

## 2020-08-12 ENCOUNTER — Encounter: Payer: Self-pay | Admitting: Cardiology

## 2020-08-12 ENCOUNTER — Ambulatory Visit (INDEPENDENT_AMBULATORY_CARE_PROVIDER_SITE_OTHER): Payer: Medicare Other | Admitting: Cardiology

## 2020-08-12 ENCOUNTER — Other Ambulatory Visit: Payer: Self-pay

## 2020-08-12 VITALS — BP 116/68 | HR 90 | Ht 69.0 in | Wt 143.0 lb

## 2020-08-12 DIAGNOSIS — I495 Sick sinus syndrome: Secondary | ICD-10-CM

## 2020-08-12 DIAGNOSIS — T82110D Breakdown (mechanical) of cardiac electrode, subsequent encounter: Secondary | ICD-10-CM | POA: Diagnosis not present

## 2020-08-12 DIAGNOSIS — I428 Other cardiomyopathies: Secondary | ICD-10-CM

## 2020-08-12 LAB — CBC WITH DIFFERENTIAL/PLATELET
Basophils Absolute: 0.1 10*3/uL (ref 0.0–0.2)
Basos: 1 %
EOS (ABSOLUTE): 0.2 10*3/uL (ref 0.0–0.4)
Eos: 3 %
Hematocrit: 33.6 % — ABNORMAL LOW (ref 37.5–51.0)
Hemoglobin: 11.4 g/dL — ABNORMAL LOW (ref 13.0–17.7)
Lymphocytes Absolute: 1.7 10*3/uL (ref 0.7–3.1)
Lymphs: 29 %
MCH: 29.5 pg (ref 26.6–33.0)
MCHC: 33.9 g/dL (ref 31.5–35.7)
MCV: 87 fL (ref 79–97)
Monocytes Absolute: 0.6 10*3/uL (ref 0.1–0.9)
Monocytes: 10 %
Neutrophils Absolute: 3.4 10*3/uL (ref 1.4–7.0)
Neutrophils: 57 %
Platelets: 197 10*3/uL (ref 150–450)
RBC: 3.87 x10E6/uL — ABNORMAL LOW (ref 4.14–5.80)
RDW: 14.2 % (ref 11.6–15.4)
WBC: 6 10*3/uL (ref 3.4–10.8)

## 2020-08-12 NOTE — Progress Notes (Signed)
Electrophysiology Office Follow up Visit Note:    Date:  08/12/2020   ID:  Russell Flattery Sr., DOB 1941/03/17, MRN [id number]  PCP:  Caryl Bis, MD  [place] Cardiologist:  Rozann Lesches, MD  [facility] Electrophysiologist:  Thompson Grayer, MD    Interval History:    Russell Westbrooks. is a 80 y.o. male who presents for a follow up visit. I last saw the patient 07/29/2020 for a pacemaker lead failure. His pacemaker system currently has a nonfunctional RA lead and an RV lead with diagnostics suggestive of worsening exit block and impending failure. He is symptomatic without RA pacing with severe chronotropic incompetence.   After our last meeting, a CT chest was ordered in addition to an echo.  No changes in symptoms since our last appointment.  He is in clinic with his wife today.    Past Medical History:  Diagnosis Date  . Anemia   . CAD (coronary artery disease), native coronary artery    a. DES to proximal, mid, and distal RCA 2006. b. 12/2014 - [place] s/p  PTCA to the mid-distal previously placed RCA stents, otherwise nonobstructive CAD for continued medical therapy.  . CHF (congestive heart failure) ([place])   . History of TIAs   . Mixed hyperlipidemia   . 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 Prostate cancer metastatic to bone ([place])   . Sinus node dysfunction [facility])    Medtronic pacemaker - Dr. Rayann Heman    Past Surgical History:  Procedure Laterality Date  . CARDIAC CATHETERIZATION N/A 01/06/2015   Procedure: Left Heart Cath and Coronary Angiography;  Surgeon: Troy Sine, MD;  Location: [place] CV LAB;  Service: Cardiovascular;  Laterality: N/A;  . PACEMAKER INSERTION     MDT  . PPM GENERATOR CHANGEOUT N/A 04/11/2020   Procedure: PPM GENERATOR CHANGEOUT;  Surgeon: Deboraha Sprang, MD;  Location: [place] CV LAB;  Service: Cardiovascular;   Laterality: N/A;    Current Medications: No outpatient medications have been marked as taking for the 08/12/20 encounter (Office Visit) with Vickie Epley, MD.     Allergies:   Penicillins, Phenazopyridine hcl, and Ramipril   Social History   Socioeconomic History  . Marital status: Married    Spouse name: Not on file  . Number of children: Not on file  . Years of education: Not on file  . Highest education level: Not on file  Occupational History  . Not on file  Tobacco Use  . Smoking status: Former Smoker    Packs/day: 1.00    Years: 5.00    Pack years: 5.00    Types: Cigarettes    Start date: 09/15/1951    Quit date: 06/14/1965    Years since quitting: 55.2  . Smokeless tobacco: Former Systems developer    Types: Chew    Quit date: 06/14/1978  . Tobacco comment: chewed tobacco 5 years 1PPD  Substance and Sexual Activity  . Alcohol use: No    Alcohol/week: 0.0 standard drinks  . Drug use: No  . Sexual activity: Not on file  Other Topics Concern  . Not on file  Social History Narrative  . Not on file   Social Determinants of Health   Financial Resource Strain: Not on file  Food Insecurity: Not on file  Transportation Needs: Not on file  Physical Activity: Not on  file  Stress: Not on file  Social Connections: Not on file     Family History: [name] family history includes Heart disease in his father and sister.  ROS:   Please see the history of present illness.    All other systems reviewed and are negative.  EKGs/Labs/Other Studies Reviewed:    The following studies were reviewed today:  08/06/2020 CT Chest w Contrast (personally reviewed) IMPRESSION: 1. There is narrowing of the left brachiocephalic vein concerning for stenosis as mentioned in radiology report. 2. The RA lead is in contact with the SVC for the entire course in the SVC (>1 cm) which is a medium risk finding for extraction. 3. The RV lead is located centrally within the SVC. 4. The RA lead  is detached from the RA appendage and free floating in the RA. This is the source of lead failure. 5. The RV lead is embedded within the RV apex without high-risk features for extraction. 6. No lead fractures or CIED lead thrombus detected.  07/30/2020 Echo personally reviewed LV systolic function mildly reduced, 45% Normal RV function Mild MR AR mild to moderate      Recent Labs: 05/21/2020: ALT 9 07/29/2020: BUN 13; Creatinine, Ser 1.03; Potassium 4.4; Sodium 138 08/12/2020: Hemoglobin 11.4; Platelets 197  Recent Lipid Panel    Component Value Date/Time   CHOL 171 01/04/2015 0431   TRIG 106 01/04/2015 0431   HDL 42 01/04/2015 0431   CHOLHDL 4.1 01/04/2015 0431   VLDL 21 01/04/2015 0431   LDLCALC 108 (H) 01/04/2015 0431    Physical Exam:    VS:  BP 116/68   Pulse 90   Ht 5\' 9"  (1.753 m)   Wt 143 lb (64.9 kg)   SpO2 98%   BMI 21.12 kg/m     Wt Readings from Last 3 Encounters:  08/12/20 143 lb (64.9 kg)  07/29/20 150 lb (68 kg)  07/18/20 150 lb (68 kg)     GEN:  Well nourished, well developed in no acute distress HEENT: Normal NECK: No JVD; No carotid bruits LYMPHATICS: No lymphadenopathy CARDIAC: RRR, no murmurs, rubs, gallops. Pacemaker well healed. RESPIRATORY:  Clear to auscultation without rales, wheezing or rhonchi  ABDOMEN: Soft, non-tender, non-distended MUSCULOSKELETAL:  No edema; No deformity  SKIN: Warm and dry NEUROLOGIC:  Alert and oriented x 3 PSYCHIATRIC:  Normal affect   ASSESSMENT:    1. Failure of pacemaker lead, subsequent encounter   2. Sinoatrial node dysfunction (HCC)   3. Nonischemic cardiomyopathy ([place])    PLAN:    In order of problems listed above:  1. Pacemaker lead failure Patient with a dysfunctional RA lead and worsening RV lead function. The leads will require replacement.  We discussed 3 options today in clinic and pros/cons of each option. First, we discussed extraction of the current system with implant of a new DDD  PPM. This approach avoids long term risks of 4 leads passing through the venous system and is able to provide him with MRI compatability. This approach is with significant risk however which we spent a great deal of time today discussing. There is significant risk of venous injury during extraction given the age of the current system, RA lead being adherent to the SVC lateral wall throughout the SVC's entire length. The leads also are adherent to the base of the RA. Lastly, the RV lead is secured to the apical RV free wall making extraction somewhat higher risk.  We discussed opening the left sided pocket  and removing the current system and capping the current leads. We could then either replace with new leads from the left side if the vein is open or we could implant a new system on the right side. There is stenosis of the L sided vein on CT making a left sided implant somewhat problematic (I do think it is possible based on my review of the CT). He is a thin man without much subcutaneous tissue. I am concerned about pocket integrity after at least 3 prior procedures in that location. There is also pain with the generator located in this area. I think the best option would be to relocate the system to the right side and abandon the existing leads on the left side. I would plan to wrap the leads with a TYRX envelope on the left and place the new system within a TYRX envelope on the right. The risks associated with this approach were discussed in detail with the patient and his family including the risk of infection necessitating extraction in the future.  After extensive discussion with the patient and his wife, they would like to pursue a right sided DDD PPM Implant with a plan to cap/abandon the existing leads on the left side.   Plan to observe overnight with ppx antibiotics post procedure.   Medication Adjustments/Labs and Tests Ordered: Current medicines are reviewed at length with the patient today.   Concerns regarding medicines are outlined above.  Orders Placed This Encounter  Procedures  . CBC w/Diff   No orders of the defined types were placed in this encounter.    Signed, Russell Mage, MD, [facility]  08/12/2020 6:11 PM    Electrophysiology [facility]

## 2020-08-12 NOTE — Patient Instructions (Addendum)
Medication Instructions:  Your physician recommends that you continue on your current medications as directed. Please refer to the Current Medication list given to you today.  Labwork: You will get lab work today:  CBC  Testing/Procedures: None ordered.  Follow-Up:  SEE INSTRUCTION LETTER  Any Other Special Instructions Will Be Listed Below (If Applicable).  If you need a refill on your cardiac medications before your next appointment, please call your pharmacy.

## 2020-08-12 NOTE — H&P (View-Only) (Signed)
Electrophysiology Office Follow up Visit Note:    Date:  08/12/2020   ID:  Russell Flattery Sr., DOB 11-Apr-1941, MRN [id number]  PCP:  Caryl Bis, MD  [place] Cardiologist:  Rozann Lesches, MD  [facility] Electrophysiologist:  Thompson Grayer, MD    Interval History:    Russell Bommarito. is a 80 y.o. male who presents for a follow up visit. I last saw the patient 07/29/2020 for a pacemaker lead failure. His pacemaker system currently has a nonfunctional RA lead and an RV lead with diagnostics suggestive of worsening exit block and impending failure. He is symptomatic without RA pacing with severe chronotropic incompetence.   After our last meeting, a CT chest was ordered in addition to an echo.  No changes in symptoms since our last appointment.  He is in clinic with his wife today.    Past Medical History:  Diagnosis Date  . Anemia   . CAD (coronary artery disease), native coronary artery    a. DES to proximal, mid, and distal RCA 2006. b. 12/2014 - [place] s/p  PTCA to the mid-distal previously placed RCA stents, otherwise nonobstructive CAD for continued medical therapy.  . CHF (congestive heart failure) ([place])   . History of TIAs   . Mixed hyperlipidemia   . 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 Prostate cancer metastatic to bone ([place])   . Sinus node dysfunction [facility])    [employer] pacemaker - Dr. Rayann Heman    Past Surgical History:  Procedure Laterality Date  . CARDIAC CATHETERIZATION N/A 01/06/2015   Procedure: Left Heart Cath and Coronary Angiography;  Surgeon: Troy Sine, MD;  Location: [place] [facility];  Service: Cardiovascular;  Laterality: N/A;  . PACEMAKER INSERTION     MDT  . PPM GENERATOR CHANGEOUT N/A 04/11/2020   Procedure: PPM GENERATOR CHANGEOUT;  Surgeon: Deboraha Sprang, MD;  Location: [place] [facility];  Service: Cardiovascular;   Laterality: N/A;    Current Medications: No outpatient medications have been marked as taking for the 08/12/20 encounter (Office Visit) with Vickie Epley, MD.     Allergies:   Penicillins, Phenazopyridine hcl, and Ramipril   Social History   Socioeconomic History  . Marital status: Married    Spouse name: Not on file  . Number of children: Not on file  . Years of education: Not on file  . Highest education level: Not on file  Occupational History  . Not on file  Tobacco Use  . Smoking status: Former Smoker    Packs/day: 1.00    Years: 5.00    Pack years: 5.00    Types: Cigarettes    Start date: 09/15/1951    Quit date: 06/14/1965    Years since quitting: 55.2  . Smokeless tobacco: Former Systems developer    Types: Chew    Quit date: 06/14/1978  . Tobacco comment: chewed tobacco 5 years 1PPD  Substance and Sexual Activity  . Alcohol use: No    Alcohol/week: 0.0 standard drinks  . Drug use: No  . Sexual activity: Not on file  Other Topics Concern  . Not on file  Social History Narrative  . Not on file   Social Determinants of Health   Financial Resource Strain: Not on file  Food Insecurity: Not on file  Transportation Needs: Not on file  Physical Activity: Not on  file  Stress: Not on file  Social Connections: Not on file     Family History: [name] family history includes Heart disease in his father and sister.  ROS:   Please see the history of present illness.    All other systems reviewed and are negative.  EKGs/Labs/Other Studies Reviewed:    The following studies were reviewed today:  08/06/2020 CT Chest w Contrast (personally reviewed) IMPRESSION: 1. There is narrowing of the left brachiocephalic vein concerning for stenosis as mentioned in radiology report. 2. The RA lead is in contact with the SVC for the entire course in the SVC (>1 cm) which is a medium risk finding for extraction. 3. The RV lead is located centrally within the SVC. 4. The RA lead  is detached from the RA appendage and free floating in the RA. This is the source of lead failure. 5. The RV lead is embedded within the RV apex without high-risk features for extraction. 6. No lead fractures or CIED lead thrombus detected.  07/30/2020 Echo personally reviewed LV systolic function mildly reduced, 45% Normal RV function Mild MR AR mild to moderate      Recent Labs: 05/21/2020: ALT 9 07/29/2020: BUN 13; Creatinine, Ser 1.03; Potassium 4.4; Sodium 138 08/12/2020: Hemoglobin 11.4; Platelets 197  Recent Lipid Panel    Component Value Date/Time   CHOL 171 01/04/2015 0431   TRIG 106 01/04/2015 0431   HDL 42 01/04/2015 0431   CHOLHDL 4.1 01/04/2015 0431   VLDL 21 01/04/2015 0431   LDLCALC 108 (H) 01/04/2015 0431    Physical Exam:    VS:  BP 116/68   Pulse 90   Ht 5\' 9"  (1.753 m)   Wt 143 lb (64.9 kg)   SpO2 98%   BMI 21.12 kg/m     Wt Readings from Last 3 Encounters:  08/12/20 143 lb (64.9 kg)  07/29/20 150 lb (68 kg)  07/18/20 150 lb (68 kg)     GEN:  Well nourished, well developed in no acute distress HEENT: Normal NECK: No JVD; No carotid bruits LYMPHATICS: No lymphadenopathy CARDIAC: RRR, no murmurs, rubs, gallops. Pacemaker well healed. RESPIRATORY:  Clear to auscultation without rales, wheezing or rhonchi  ABDOMEN: Soft, non-tender, non-distended MUSCULOSKELETAL:  No edema; No deformity  SKIN: Warm and dry NEUROLOGIC:  Alert and oriented x 3 PSYCHIATRIC:  Normal affect   ASSESSMENT:    1. Failure of pacemaker lead, subsequent encounter   2. Sinoatrial node dysfunction (HCC)   3. Nonischemic cardiomyopathy ([place])    PLAN:    In order of problems listed above:  1. Pacemaker lead failure Patient with a dysfunctional RA lead and worsening RV lead function. The leads will require replacement.  We discussed 3 options today in clinic and pros/cons of each option. First, we discussed extraction of the current system with implant of a new DDD  PPM. This approach avoids long term risks of 4 leads passing through the venous system and is able to provide him with MRI compatability. This approach is with significant risk however which we spent a great deal of time today discussing. There is significant risk of venous injury during extraction given the age of the current system, RA lead being adherent to the SVC lateral wall throughout the SVC's entire length. The leads also are adherent to the base of the RA. Lastly, the RV lead is secured to the apical RV free wall making extraction somewhat higher risk.  We discussed opening the left sided pocket  and removing the current system and capping the current leads. We could then either replace with new leads from the left side if the vein is open or we could implant a new system on the right side. There is stenosis of the L sided vein on CT making a left sided implant somewhat problematic (I do think it is possible based on my review of the CT). He is a thin man without much subcutaneous tissue. I am concerned about pocket integrity after at least 3 prior procedures in that location. There is also pain with the generator located in this area. I think the best option would be to relocate the system to the right side and abandon the existing leads on the left side. I would plan to wrap the leads with a TYRX envelope on the left and place the new system within a TYRX envelope on the right. The risks associated with this approach were discussed in detail with the patient and his family including the risk of infection necessitating extraction in the future.  After extensive discussion with the patient and his wife, they would like to pursue a right sided DDD PPM Implant with a plan to cap/abandon the existing leads on the left side.   Plan to observe overnight with ppx antibiotics post procedure.   Medication Adjustments/Labs and Tests Ordered: Current medicines are reviewed at length with the patient today.   Concerns regarding medicines are outlined above.  Orders Placed This Encounter  Procedures  . CBC w/Diff   No orders of the defined types were placed in this encounter.    Signed, Russell Mage, MD, [facility]  08/12/2020 6:11 PM    Electrophysiology [facility]

## 2020-08-20 ENCOUNTER — Ambulatory Visit: Payer: Medicare Other

## 2020-08-20 ENCOUNTER — Ambulatory Visit: Payer: Medicare Other | Admitting: Oncology

## 2020-08-20 ENCOUNTER — Other Ambulatory Visit: Payer: Medicare Other

## 2020-08-20 ENCOUNTER — Telehealth: Payer: Self-pay | Admitting: Oncology

## 2020-08-20 NOTE — Telephone Encounter (Signed)
Returned call to cancel today's appointment. Left message to call back to reschedule if needed.

## 2020-08-22 ENCOUNTER — Other Ambulatory Visit (HOSPITAL_COMMUNITY)
Admission: RE | Admit: 2020-08-22 | Discharge: 2020-08-22 | Disposition: A | Discharge status: Home / Self Care | Payer: Medicare Other | Source: Ambulatory Visit | Attending: Cardiology | Admitting: Cardiology

## 2020-08-22 DIAGNOSIS — Z20822 Contact with and (suspected) exposure to covid-19: Secondary | ICD-10-CM | POA: Diagnosis not present

## 2020-08-22 DIAGNOSIS — Z01812 Encounter for preprocedural laboratory examination: Secondary | ICD-10-CM | POA: Insufficient documentation

## 2020-08-23 LAB — SARS CORONAVIRUS 2 (TAT 6-24 HRS): SARS Coronavirus 2: NEGATIVE

## 2020-08-25 ENCOUNTER — Ambulatory Visit (HOSPITAL_COMMUNITY): Payer: Medicare Other

## 2020-08-25 ENCOUNTER — Other Ambulatory Visit: Payer: Self-pay

## 2020-08-25 ENCOUNTER — Encounter (HOSPITAL_COMMUNITY)
Admission: RE | Disposition: A | Discharge status: Home / Self Care | Payer: Self-pay | Source: Home / Self Care | Attending: Cardiology

## 2020-08-25 ENCOUNTER — Ambulatory Visit (HOSPITAL_COMMUNITY)
Admission: RE | Admit: 2020-08-25 | Discharge: 2020-08-25 | Disposition: A | Discharge status: Home / Self Care | Payer: Medicare Other | Attending: Cardiology | Admitting: Cardiology

## 2020-08-25 DIAGNOSIS — Z87891 Personal history of nicotine dependence: Secondary | ICD-10-CM | POA: Insufficient documentation

## 2020-08-25 DIAGNOSIS — T82110A Breakdown (mechanical) of cardiac electrode, initial encounter: Secondary | ICD-10-CM | POA: Insufficient documentation

## 2020-08-25 DIAGNOSIS — Z880 Allergy status to penicillin: Secondary | ICD-10-CM | POA: Insufficient documentation

## 2020-08-25 DIAGNOSIS — Z888 Allergy status to other drugs, medicaments and biological substances status: Secondary | ICD-10-CM | POA: Diagnosis not present

## 2020-08-25 DIAGNOSIS — I495 Sick sinus syndrome: Secondary | ICD-10-CM | POA: Diagnosis not present

## 2020-08-25 DIAGNOSIS — Z8249 Family history of ischemic heart disease and other diseases of the circulatory system: Secondary | ICD-10-CM | POA: Diagnosis not present

## 2020-08-25 DIAGNOSIS — Y831 Surgical operation with implant of artificial internal device as the cause of abnormal reaction of the patient, or of later complication, without mention of misadventure at the time of the procedure: Secondary | ICD-10-CM | POA: Diagnosis not present

## 2020-08-25 DIAGNOSIS — Z950 Presence of cardiac pacemaker: Secondary | ICD-10-CM

## 2020-08-25 HISTORY — PX: PACEMAKER IMPLANT: EP1218

## 2020-08-25 HISTORY — PX: LEAD REVISION/REPAIR: EP1213

## 2020-08-25 SURGERY — PACEMAKER IMPLANT

## 2020-08-25 MED ORDER — VANCOMYCIN HCL IN DEXTROSE 1-5 GM/200ML-% IV SOLN
INTRAVENOUS | Status: AC
  Filled 2020-08-25: qty 200

## 2020-08-25 MED ORDER — SODIUM CHLORIDE 0.9 % IV SOLN
80.0000 mg | INTRAVENOUS | Status: AC
  Administered 2020-08-25 (×2): 80 mg

## 2020-08-25 MED ORDER — ACETAMINOPHEN 325 MG PO TABS: 325.0000 mg | ORAL_TABLET | ORAL | Status: DC | PRN

## 2020-08-25 MED ORDER — POVIDONE-IODINE 10 % EX SWAB: 2.0000 "application " | Freq: Once | CUTANEOUS | Status: DC

## 2020-08-25 MED ORDER — SODIUM CHLORIDE 0.9 % IV SOLN: INTRAVENOUS | Status: DC

## 2020-08-25 MED ORDER — VANCOMYCIN HCL 1000 MG/200ML IV SOLN
1000.0000 mg | INTRAVENOUS | Status: AC
  Administered 2020-08-25: 1000 mg via INTRAVENOUS

## 2020-08-25 MED ORDER — MIDAZOLAM HCL 5 MG/5ML IJ SOLN
INTRAMUSCULAR | Status: DC | PRN
  Administered 2020-08-25 (×4): 1 mg via INTRAVENOUS

## 2020-08-25 MED ORDER — CHLORHEXIDINE GLUCONATE 4 % EX LIQD: 4.0000 "application " | Freq: Once | CUTANEOUS | Status: DC

## 2020-08-25 MED ORDER — MIDAZOLAM HCL 5 MG/5ML IJ SOLN
INTRAMUSCULAR | Status: AC
  Filled 2020-08-25: qty 5

## 2020-08-25 MED ORDER — ONDANSETRON HCL 4 MG/2ML IJ SOLN: 4.0000 mg | Freq: Four times a day (QID) | INTRAMUSCULAR | Status: DC | PRN

## 2020-08-25 MED ORDER — HEPARIN (PORCINE) IN NACL 1000-0.9 UT/500ML-% IV SOLN
INTRAVENOUS | Status: DC | PRN
  Administered 2020-08-25: 500 mL

## 2020-08-25 MED ORDER — FENTANYL CITRATE (PF) 100 MCG/2ML IJ SOLN
INTRAMUSCULAR | Status: DC | PRN
  Administered 2020-08-25 (×4): 25 ug via INTRAVENOUS

## 2020-08-25 MED ORDER — HEPARIN (PORCINE) IN NACL 1000-0.9 UT/500ML-% IV SOLN
INTRAVENOUS | Status: AC
  Filled 2020-08-25: qty 500

## 2020-08-25 MED ORDER — LIDOCAINE HCL (PF) 1 % IJ SOLN
INTRAMUSCULAR | Status: AC
  Filled 2020-08-25: qty 120

## 2020-08-25 MED ORDER — SODIUM CHLORIDE 0.9 % IV SOLN
INTRAVENOUS | Status: AC
  Filled 2020-08-25: qty 2

## 2020-08-25 MED ORDER — LIDOCAINE HCL (PF) 1 % IJ SOLN
INTRAMUSCULAR | Status: DC | PRN
  Administered 2020-08-25 (×2): 45 mL

## 2020-08-25 MED ORDER — FENTANYL CITRATE (PF) 100 MCG/2ML IJ SOLN
INTRAMUSCULAR | Status: AC
  Filled 2020-08-25: qty 2

## 2020-08-25 SURGICAL SUPPLY — 11 items
CABLE SURGICAL S-101-97-12 (CABLE) ×3 IMPLANT
KIT LEAD END CAP (Cap) IMPLANT
KIT MICROPUNCTURE NIT STIFF (SHEATH) ×1 IMPLANT
LEAD CAPSURE NOVUS 45CM (Lead) ×1 IMPLANT
LEAD CAPSURE NOVUS 5076-52CM (Lead) ×1 IMPLANT
LEAD END CAP (Cap) ×2 IMPLANT
PAD PRO RADIOLUCENT 2001M-C (PAD) ×2 IMPLANT
POUCH AIGIS-R ANTIBACT ICD (Mesh General) ×4 IMPLANT
POUCH AIGIS-R ANTIBACT ICD LRG (Mesh General) IMPLANT
SHEATH 7FR PRELUDE SNAP 13 (SHEATH) ×2 IMPLANT
WIRE HI TORQ VERSACORE-J 145CM (WIRE) ×1 IMPLANT

## 2020-08-25 NOTE — Progress Notes (Signed)
[employer] rep in to see pt.  Pt. To radiology for chest xray.

## 2020-08-25 NOTE — Discharge Instructions (Signed)
    Supplemental Discharge Instructions for  Pacemaker/Defibrillator Patients  Tomorrow, 08/26/20, send in a device transmission  Activity No heavy lifting or vigorous activity with your left/right arm for 6 to 8 weeks.  Do not raise your left/right arm above your head for one week.  Gradually raise your affected arm as drawn below.              09/05/20                    09/06/20                   09/07/20                   09/08/20 __  NO DRIVING until cleared to at your wound check visit  [place] the wound area clean and dry.  Do not get this area wet , no showers until cleared to at your wound check visit. - Tomorrow, 08/26/20, remove the arm sling - DO NOT remove bulky dressings currently in place on either side, you have a wound check visit Wed 08/27/20 and these will be removed at that time and you will get further instructions for wound care at that visit. - If you notice any drainage or discharge from the wound, any swelling or bruising at the site, or you develop a fever > 101? F after you are discharged home, call the office at once.  Special Instructions - You are still able to use cellular telephones; use the ear opposite the side where you have your pacemaker/defibrillator.  Avoid carrying your cellular phone near your device. - When traveling through airports, show security personnel your identification card to avoid being screened in the metal detectors.  Ask the security personnel to use the hand wand. - Avoid arc welding equipment, MRI testing (magnetic resonance imaging), TENS units (transcutaneous nerve stimulators).  Call the office for questions about other devices. - Avoid electrical appliances that are in poor condition or are not properly grounded. - Microwave ovens are safe to be near or to operate.

## 2020-08-25 NOTE — Interval H&P Note (Signed)
History and Physical Interval Note:  08/25/2020 2:11 PM  Russell A Spillman Sr.  has presented today for surgery, with the diagnosis of lead failure.  The various methods of treatment have been discussed with the patient and family. After consideration of risks, benefits and other options for treatment, the patient has consented to  Procedure(s): PACEMAKER IMPLANT (N/A) LEAD REVISION/REPAIR (N/A) as a surgical intervention.  [name] history has been reviewed, patient examined, no change in status, stable for surgery.  I have reviewed the patient's chart and labs.  Questions were answered to the patient's satisfaction.     Russell Browning

## 2020-08-26 ENCOUNTER — Encounter (HOSPITAL_COMMUNITY): Payer: Self-pay | Admitting: Cardiology

## 2020-08-27 ENCOUNTER — Other Ambulatory Visit: Payer: Self-pay

## 2020-08-27 ENCOUNTER — Ambulatory Visit (INDEPENDENT_AMBULATORY_CARE_PROVIDER_SITE_OTHER): Payer: Medicare Other | Admitting: Emergency Medicine

## 2020-08-27 DIAGNOSIS — T82110D Breakdown (mechanical) of cardiac electrode, subsequent encounter: Secondary | ICD-10-CM

## 2020-08-27 NOTE — Progress Notes (Signed)
Pt in office for pressure dressing removal.  Outer dressing removed from Left chest explant site ans right chest implant site No selling present,  Bruising noted around both sites, Pt educated on s/s to monitor for and report.  Multi layers of steri strips intact on both site, some dried blood noticed, no active bleeding present.  Pt indicated he has an appointment with Dr. Quentin Ore tomorrow, therefore Device was not interrogated today.     After pt left office noted that 3/18 appt was cancelled, pt is scheduled for wound check appt in device clinic on 09/04/20.  Called pt and left a message advsing no appt tomorrow, keep steri strips clean and dry until wound check on 3/24.

## 2020-09-04 ENCOUNTER — Telehealth: Payer: Self-pay | Admitting: Oncology

## 2020-09-04 ENCOUNTER — Other Ambulatory Visit: Payer: Self-pay

## 2020-09-04 ENCOUNTER — Ambulatory Visit (INDEPENDENT_AMBULATORY_CARE_PROVIDER_SITE_OTHER): Payer: Medicare Other | Admitting: Emergency Medicine

## 2020-09-04 DIAGNOSIS — I495 Sick sinus syndrome: Secondary | ICD-10-CM | POA: Diagnosis not present

## 2020-09-04 LAB — CUP PACEART INCLINIC DEVICE CHECK
Battery Remaining Longevity: 123 mo
Battery Voltage: 3.13 V
Brady Statistic AP VP Percent: 0.03 %
Brady Statistic AP VS Percent: 54.9 %
Brady Statistic AS VP Percent: 0 %
Brady Statistic AS VS Percent: 45.07 %
Brady Statistic RA Percent Paced: 54.97 %
Brady Statistic RV Percent Paced: 0.03 %
Date Time Interrogation Session: 20220324131547
Implantable Lead Implant Date: 20220314
Implantable Lead Implant Date: 20220314
Implantable Lead Location: 753859
Implantable Lead Location: 753860
Implantable Lead Model: 5076
Implantable Lead Model: 5076
Implantable Pulse Generator Implant Date: 20211029
Lead Channel Impedance Value: 380 Ohm
Lead Channel Impedance Value: 399 Ohm
Lead Channel Impedance Value: 437 Ohm
Lead Channel Impedance Value: 494 Ohm
Lead Channel Pacing Threshold Amplitude: 0.5 V
Lead Channel Pacing Threshold Amplitude: 0.75 V
Lead Channel Pacing Threshold Pulse Width: 0.4 ms
Lead Channel Pacing Threshold Pulse Width: 0.4 ms
Lead Channel Sensing Intrinsic Amplitude: 3.5 mV
Lead Channel Sensing Intrinsic Amplitude: 6.875 mV
Lead Channel Setting Pacing Amplitude: 3.5 V
Lead Channel Setting Pacing Amplitude: 3.5 V
Lead Channel Setting Pacing Pulse Width: 0.4 ms
Lead Channel Setting Sensing Sensitivity: 1.2 mV

## 2020-09-04 NOTE — Patient Instructions (Signed)
New steri-strips applied today.  Please keep these dry.  If necessary, you may shower after 3/28, cover the wound with bandage and keep out of spray of shower.  Monitor both incisions for signs of infection: redness, swelling, drainage.

## 2020-09-04 NOTE — Progress Notes (Signed)
Wound check appointment. Steri-strips removed from 2 wounds.  Explant site on left check wound edges well approximated, incision well healed.  Implant site on Right chest, wound without redness or edema, Incision edges not fully approximated,  medial 1/3 of wound not fully closed.  Benzoin applied along with 7 new steri- strips.  Pt educated to keep dry and continue to monitor.  Follow-up appointment scheduled for 09/11/20, for wound recheck. Normal device function. Thresholds, sensing, and impedances consistent with implant measurements. Device outputs programmed at 3.5V for extra safety margin until 3 month visit due to presence of new leads.  Histogram distribution appropriate for patient and level of activity. No mode switches or high ventricular rates noted. Patient educated about wound care, arm mobility, lifting restrictions. ROV with Dr. Quentin Ore on 11/28/20.  Patient is enrolled in remote monitoring, next scheduled check 10/13/20.

## 2020-09-04 NOTE — Telephone Encounter (Signed)
Scheduled per sch msg. Called and spoke with patient. Confirmed date and time ° °

## 2020-09-08 ENCOUNTER — Inpatient Hospital Stay: Payer: Medicare Other

## 2020-09-08 ENCOUNTER — Inpatient Hospital Stay: Payer: Medicare Other | Attending: Oncology | Admitting: Oncology

## 2020-09-08 ENCOUNTER — Other Ambulatory Visit: Payer: Self-pay

## 2020-09-08 VITALS — BP 120/78 | HR 89 | Temp 98.5°F | Resp 17 | Ht 69.0 in | Wt 148.1 lb

## 2020-09-08 DIAGNOSIS — Z5111 Encounter for antineoplastic chemotherapy: Secondary | ICD-10-CM | POA: Insufficient documentation

## 2020-09-08 DIAGNOSIS — I25119 Atherosclerotic heart disease of native coronary artery with unspecified angina pectoris: Secondary | ICD-10-CM | POA: Diagnosis not present

## 2020-09-08 DIAGNOSIS — C7951 Secondary malignant neoplasm of bone: Secondary | ICD-10-CM | POA: Insufficient documentation

## 2020-09-08 DIAGNOSIS — C61 Malignant neoplasm of prostate: Secondary | ICD-10-CM | POA: Diagnosis present

## 2020-09-08 LAB — CBC WITH DIFFERENTIAL (CANCER CENTER ONLY)
Abs Immature Granulocytes: 0.01 10*3/uL (ref 0.00–0.07)
Basophils Absolute: 0.1 10*3/uL (ref 0.0–0.1)
Basophils Relative: 1 %
Eosinophils Absolute: 0.3 10*3/uL (ref 0.0–0.5)
Eosinophils Relative: 4 %
HCT: 34.9 % — ABNORMAL LOW (ref 39.0–52.0)
Hemoglobin: 11.5 g/dL — ABNORMAL LOW (ref 13.0–17.0)
Immature Granulocytes: 0 %
Lymphocytes Relative: 24 %
Lymphs Abs: 1.6 10*3/uL (ref 0.7–4.0)
MCH: 29.3 pg (ref 26.0–34.0)
MCHC: 33 g/dL (ref 30.0–36.0)
MCV: 88.8 fL (ref 80.0–100.0)
Monocytes Absolute: 0.6 10*3/uL (ref 0.1–1.0)
Monocytes Relative: 9 %
Neutro Abs: 4.3 10*3/uL (ref 1.7–7.7)
Neutrophils Relative %: 62 %
Platelet Count: 173 10*3/uL (ref 150–400)
RBC: 3.93 MIL/uL — ABNORMAL LOW (ref 4.22–5.81)
RDW: 13.4 % (ref 11.5–15.5)
WBC Count: 6.9 10*3/uL (ref 4.0–10.5)
nRBC: 0 % (ref 0.0–0.2)

## 2020-09-08 LAB — CMP (CANCER CENTER ONLY)
ALT: 13 U/L (ref 0–44)
AST: 16 U/L (ref 15–41)
Albumin: 4.1 g/dL (ref 3.5–5.0)
Alkaline Phosphatase: 32 U/L — ABNORMAL LOW (ref 38–126)
Anion gap: 9 (ref 5–15)
BUN: 12 mg/dL (ref 8–23)
CO2: 26 mmol/L (ref 22–32)
Calcium: 9 mg/dL (ref 8.9–10.3)
Chloride: 103 mmol/L (ref 98–111)
Creatinine: 1.15 mg/dL (ref 0.61–1.24)
GFR, Estimated: >60 mL/min (ref >60)
Glucose, Bld: 86 mg/dL (ref 70–99)
Potassium: 3.9 mmol/L (ref 3.5–5.1)
Sodium: 138 mmol/L (ref 135–145)
Total Bilirubin: 0.4 mg/dL (ref 0.3–1.2)
Total Protein: 6.9 g/dL (ref 6.5–8.1)

## 2020-09-08 MED ORDER — LEUPROLIDE ACETATE (3 MONTH) 22.5 MG ~~LOC~~ KIT
22.5000 mg | PACK | Freq: Once | SUBCUTANEOUS | Status: AC
  Administered 2020-09-08: 22.5 mg via SUBCUTANEOUS

## 2020-09-08 MED ORDER — LEUPROLIDE ACETATE (3 MONTH) 22.5 MG ~~LOC~~ KIT
PACK | SUBCUTANEOUS | Status: AC
  Filled 2020-09-08: qty 22.5

## 2020-09-08 NOTE — Progress Notes (Signed)
Hematology and Oncology Follow Up Visit  Russell Browning [id number] 05/01/1941 80 y.o. 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 He presented with localized disease in 2000.   Prior Therapy: 1. He is status post radiation therapy and 2 years of Lupron for locally advanced prostate cancer at the time of diagnosis in 2000.  2.  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  He was started on Lupron at that time.  3. He is S/P radiation therapy directed to the right hip (30 gray in 12 fractions) concluded in 07/2011 for palliative purposes.   4. Xgeva given every 3 months started in 10/2011.  Therapy discontinued due to based on patient's preference.  Current therapy:   Eligard every 3 months.  Injection will be given today.   Interim History:  Russell Browning returns today for a repeat evaluation.  Since the last visit, he reports feeling well without any major complaints.  He underwent pacemaker placement after lead failure on August 25, 2020.  If feels well at this time without any major complaints.  He denies any bone pain pathological fractures for constitutional symptoms.  His performance status quality of life remained excellent.      Medications: Updated on review. Current Outpatient Medications  Medication Sig Dispense Refill  . aspirin EC 81 MG EC tablet Take 1 tablet (81 mg total) by mouth daily. 30 tablet 11  . Calcium Carbonate-Vitamin D (CALCIUM-VITAMIN D) 500-200 MG-UNIT per tablet Take 1 tablet by mouth daily.    . clonazePAM (KLONOPIN) 1 MG tablet Take 2 tablets (2 mg total) by mouth at bedtime.    . clopidogrel (PLAVIX) 75 MG tablet TAKE ONE TABLET BY MOUTH DAILY (Patient taking differently: Take 75 mg by mouth daily.) 90 tablet 1  . guaiFENesin (MUCINEX) 600 MG 12 hr tablet Take 600 mg by mouth 2 (two) times daily  as needed for cough or to loosen phlegm.    . isosorbide mononitrate (IMDUR) 30 MG 24 hr tablet TAKE ONE TABLET BY MOUTH DAILY (Patient taking differently: Take 30 mg by mouth daily.) 90 tablet 2  . leuprolide (LUPRON) 22.5 MG injection Inject 22.5 mg into the muscle every 3 (three) months.    . levothyroxine (SYNTHROID, LEVOTHROID) 50 MCG tablet Take 50 mcg by mouth daily before breakfast.     . Melatonin 10 MG TABS Take 10 mg by mouth at bedtime.    . mineral oil liquid Take 45 mLs by mouth See admin instructions. Every three days for constipation    . nitroGLYCERIN (NITROSTAT) 0.4 MG SL tablet Place 1 tablet (0.4 mg total) under the tongue every 5 (five) minutes as needed for chest pain. 25 tablet 3  . Omega-3 Fatty Acids (FISH OIL) 1000 MG CAPS Take 1,000 mg by mouth daily.    . pantoprazole (PROTONIX) 40 MG tablet Take 40 mg by mouth 2 (two) times daily before a meal.    . simvastatin (ZOCOR) 40 MG tablet Take 40 mg by mouth at bedtime.    Marland Kitchen venlafaxine XR (EFFEXOR-XR) 75 MG 24 hr capsule Take 75 mg by mouth daily. Pt states taking one half tablet 75 mg by mouth daily.    . vitamin B-12 (CYANOCOBALAMIN) 1000 MCG tablet Take 1,000 mcg by mouth daily.     No current facility-administered medications for this visit.  Allergies:  Allergies  Allergen Reactions  . Penicillins Other (See Comments)    Passed out  . Phenazopyridine Hcl Hives  . Ramipril Hives       Physical Exam:   Blood pressure 120/78, pulse 89, temperature 98.5 F (36.9 C), temperature source Tympanic, resp. rate 17, height 5\' 9"  (1.753 m), weight 148 lb 1.6 oz (67.2 kg), SpO2 100 %.        ECOG: 1     General appearance: Alert, awake without any distress. Head: Atraumatic without abnormalities Oropharynx: Without any thrush or ulcers. Eyes: No scleral icterus. Lymph nodes: No lymphadenopathy noted in the cervical, supraclavicular, or axillary nodes Heart:regular rate and rhythm, without any  murmurs or gallops.   Lung: Clear to auscultation without any rhonchi, wheezes or dullness to percussion. Abdomin: Soft, nontender without any shifting dullness or ascites. Musculoskeletal: No clubbing or cyanosis. Neurological: No motor or sensory deficits. Skin: No rashes or lesions.            Lab Results: Lab Results  Component Value Date   WBC 6.0 08/12/2020   HGB 11.4 (L) 08/12/2020   HCT 33.6 (L) 08/12/2020   MCV 87 08/12/2020   PLT 197 08/12/2020          Results for SANTI, TROUNG SR. (MRN [id number]) as of 09/08/2020 09:56  Ref. Range 05/21/2020 09:25  Prostate Specific Ag, Serum Latest Ref Range: 0.0 - 4.0 ng/mL 0.3          Impression and Plan:   80 year old man with:   1. 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  He is currently on Eligard with PSA remains under control for the time being.  Risks and benefits of additional treatment for his prostate cancer were discussed at this time.  These include Zytiga, Xtandi, systemic chemotherapy among other options.  We have elected to defer these treatments until he develops castration hyper resistant disease which is possible in the near future.  2. Bone pain: No recent exacerbation noted at this time.  3. Bone directed therapy: I recommended continuing calcium and vitamin D supplements.  He deferred to option of Xgeva at this time.  4. Androgen depravation: He will receive Eligard today and repeated in 3 months.  Long-term complication putting osteoporosis, sexual dysfunction and hot flashes were reiterated.  5. Follow up: He will return in 3 months for a follow-up evaluation.  30 minutes were dedicated to this encounter.  The time was spent on reviewing disease status, discussing treatment options and complications related to his cancer and cancer therapy.   Zola Button, MD 3/28/20229:54 AM

## 2020-09-08 NOTE — Patient Instructions (Signed)

## 2020-09-09 LAB — PROSTATE-SPECIFIC AG, SERUM (LABCORP): Prostate Specific Ag, Serum: 0.4 ng/mL (ref 0.0–4.0)

## 2020-09-11 ENCOUNTER — Other Ambulatory Visit: Payer: Self-pay

## 2020-09-11 ENCOUNTER — Ambulatory Visit (INDEPENDENT_AMBULATORY_CARE_PROVIDER_SITE_OTHER): Payer: Medicare Other | Admitting: Emergency Medicine

## 2020-09-11 DIAGNOSIS — Z950 Presence of cardiac pacemaker: Secondary | ICD-10-CM

## 2020-09-11 NOTE — Progress Notes (Signed)
Pt in office for wound recheck.  Steri strips removed, edges well approximated, wound healed.  Pt educated on wound care and follow-up visits.

## 2020-10-12 ENCOUNTER — Other Ambulatory Visit: Payer: Self-pay | Admitting: Cardiology

## 2020-10-13 ENCOUNTER — Ambulatory Visit (INDEPENDENT_AMBULATORY_CARE_PROVIDER_SITE_OTHER): Payer: Medicare Other

## 2020-10-13 DIAGNOSIS — I428 Other cardiomyopathies: Secondary | ICD-10-CM

## 2020-10-15 ENCOUNTER — Telehealth: Payer: Self-pay

## 2020-10-15 LAB — CUP PACEART REMOTE DEVICE CHECK
Battery Remaining Longevity: 124 mo
Battery Voltage: 3.1 V
Brady Statistic AP VP Percent: 0.03 %
Brady Statistic AP VS Percent: 67.77 %
Brady Statistic AS VP Percent: 0.02 %
Brady Statistic AS VS Percent: 32.18 %
Brady Statistic RA Percent Paced: 67.73 %
Brady Statistic RV Percent Paced: 0.06 %
Date Time Interrogation Session: 20220501205537
Implantable Lead Implant Date: 20220314
Implantable Lead Implant Date: 20220314
Implantable Lead Location: 753859
Implantable Lead Location: 753860
Implantable Lead Model: 5076
Implantable Lead Model: 5076
Implantable Pulse Generator Implant Date: 20211029
Lead Channel Impedance Value: 361 Ohm
Lead Channel Impedance Value: 361 Ohm
Lead Channel Impedance Value: 456 Ohm
Lead Channel Impedance Value: 456 Ohm
Lead Channel Pacing Threshold Amplitude: 0.5 V
Lead Channel Pacing Threshold Amplitude: 0.75 V
Lead Channel Pacing Threshold Pulse Width: 0.4 ms
Lead Channel Pacing Threshold Pulse Width: 0.4 ms
Lead Channel Sensing Intrinsic Amplitude: 2.5 mV
Lead Channel Sensing Intrinsic Amplitude: 2.5 mV
Lead Channel Sensing Intrinsic Amplitude: 8.25 mV
Lead Channel Sensing Intrinsic Amplitude: 8.25 mV
Lead Channel Setting Pacing Amplitude: 3.5 V
Lead Channel Setting Pacing Amplitude: 3.5 V
Lead Channel Setting Pacing Pulse Width: 0.4 ms
Lead Channel Setting Sensing Sensitivity: 1.2 mV

## 2020-10-15 NOTE — Telephone Encounter (Signed)
[facility] scheduled remote received 10/12/20. 69 AT/AF event logged including AF w/ RVR, longest in duration 10 minutes. AT/AF burden <0.1%. No OAC on file. Patient called and unable to recall any symptoms during the dates of events on 4/13 & 4/14. Advised patient I will forward to Dr. Quentin Ore and we will call with any changes. Patient agreeable to plan.

## 2020-10-16 NOTE — Telephone Encounter (Signed)
Called and spoke with patient's spouse, Mary-EC on file, she is aware of appt 10/20/20 at 3 pm with Adline Peals, PA-C.

## 2020-10-16 NOTE — Telephone Encounter (Signed)
Called and left message for patient to call back so we can schedule appt.

## 2020-10-20 ENCOUNTER — Encounter (HOSPITAL_COMMUNITY): Payer: Self-pay | Admitting: Physician Assistant

## 2020-10-20 ENCOUNTER — Ambulatory Visit (HOSPITAL_COMMUNITY)
Admission: RE | Admit: 2020-10-20 | Discharge: 2020-10-20 | Disposition: A | Discharge status: Home / Self Care | Payer: Medicare Other | Source: Ambulatory Visit | Attending: Physician Assistant | Admitting: Physician Assistant

## 2020-10-20 ENCOUNTER — Other Ambulatory Visit: Payer: Self-pay

## 2020-10-20 VITALS — BP 114/60 | HR 79 | Ht 69.0 in | Wt 150.6 lb

## 2020-10-20 DIAGNOSIS — Z8673 Personal history of transient ischemic attack (TIA), and cerebral infarction without residual deficits: Secondary | ICD-10-CM | POA: Diagnosis not present

## 2020-10-20 DIAGNOSIS — I480 Paroxysmal atrial fibrillation: Secondary | ICD-10-CM | POA: Insufficient documentation

## 2020-10-20 DIAGNOSIS — Z950 Presence of cardiac pacemaker: Secondary | ICD-10-CM | POA: Diagnosis not present

## 2020-10-20 DIAGNOSIS — Z8546 Personal history of malignant neoplasm of prostate: Secondary | ICD-10-CM | POA: Insufficient documentation

## 2020-10-20 DIAGNOSIS — Z880 Allergy status to penicillin: Secondary | ICD-10-CM | POA: Diagnosis not present

## 2020-10-20 DIAGNOSIS — I2510 Atherosclerotic heart disease of native coronary artery without angina pectoris: Secondary | ICD-10-CM | POA: Insufficient documentation

## 2020-10-20 DIAGNOSIS — Z7982 Long term (current) use of aspirin: Secondary | ICD-10-CM | POA: Diagnosis not present

## 2020-10-20 DIAGNOSIS — Z79899 Other long term (current) drug therapy: Secondary | ICD-10-CM | POA: Diagnosis not present

## 2020-10-20 DIAGNOSIS — Z8249 Family history of ischemic heart disease and other diseases of the circulatory system: Secondary | ICD-10-CM | POA: Diagnosis not present

## 2020-10-20 DIAGNOSIS — Z888 Allergy status to other drugs, medicaments and biological substances status: Secondary | ICD-10-CM | POA: Insufficient documentation

## 2020-10-20 DIAGNOSIS — D6869 Other thrombophilia: Secondary | ICD-10-CM

## 2020-10-20 DIAGNOSIS — Z79890 Hormone replacement therapy (postmenopausal): Secondary | ICD-10-CM | POA: Insufficient documentation

## 2020-10-20 DIAGNOSIS — Z87891 Personal history of nicotine dependence: Secondary | ICD-10-CM | POA: Diagnosis not present

## 2020-10-20 MED ORDER — ELIQUIS 5 MG PO TABS: 5.0000 mg | ORAL_TABLET | Freq: Two times a day (BID) | ORAL | 3 refills | Status: DC

## 2020-10-20 NOTE — Progress Notes (Signed)
Primary Care Physician: Caryl Bis, MD Primary Cardiologist: Dr Domenic Polite Primary Electrophysiologist: Dr Rayann Heman Referring Physician: Dr Early Chars Sr. is a 80 y.o. male with a history of CAD, sinus node dysfunction s/p PPM, TIAs, prostate cancer, and atrial fibrillation who presents for follow up in the [facility].  The patient was initially diagnosed with atrial fibrillation 10/12/20 on an alert for the device clinic. Longest episode was 10 minutes with overall burden < 0.1%. Patient has a CHADS2VASC score of 5. Patient reports he is slowly getting his strength back post surgery. He was unaware of his arrhythmia.   Today, he denies symptoms of palpitations, chest pain, shortness of breath, orthopnea, PND, lower extremity edema, dizziness, presyncope, syncope, snoring, daytime somnolence, bleeding, or neurologic sequela. The patient is tolerating medications without difficulties and is otherwise without complaint today.    Atrial Fibrillation Risk Factors:  he does not have symptoms or diagnosis of sleep apnea. he does not have a history of rheumatic fever. he does not have a history of alcohol use.  he has a BMI of Body mass index is 22.24 kg/m.Marland Kitchen Filed Weights   10/20/20 1430  Weight: 68.3 kg    Family History  Problem Relation Age of Onset  . Heart disease Sister   . Heart disease Father      Atrial Fibrillation Management history:  Previous antiarrhythmic drugs: none Previous cardioversions: none Previous ablations: none CHADS2VASC score: 5 Anticoagulation history: none   Past Medical History:  Diagnosis Date  . Anemia   . CAD (coronary artery disease), native coronary artery    a. DES to proximal, mid, and distal RCA 2006. b. 12/2014 - [place] s/p  PTCA to the mid-distal previously placed RCA stents, otherwise nonobstructive CAD for continued medical therapy.  . CHF (congestive heart failure) ([place])   . History of TIAs   .  Mixed hyperlipidemia   . 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 Prostate cancer metastatic to bone ([place])   . Sinus node dysfunction [facility])    Medtronic pacemaker - Dr. Rayann Heman   Past Surgical History:  Procedure Laterality Date  . CARDIAC CATHETERIZATION N/A 01/06/2015   Procedure: Left Heart Cath and Coronary Angiography;  Surgeon: Troy Sine, MD;  Location: [facility];  Service: Cardiovascular;  Laterality: N/A;  . LEAD REVISION/REPAIR N/A 08/25/2020   Procedure: LEAD REVISION/REPAIR;  Surgeon: Vickie Epley, MD;  Location: [facility];  Service: Cardiovascular;  Laterality: N/A;  . PACEMAKER IMPLANT N/A 08/25/2020   Procedure: PACEMAKER IMPLANT;  Surgeon: Vickie Epley, MD;  Location: [place] [facility];  Service: Cardiovascular;  Laterality: N/A;  . PACEMAKER INSERTION     MDT  . PPM GENERATOR CHANGEOUT N/A 04/11/2020   Procedure: PPM GENERATOR CHANGEOUT;  Surgeon: Deboraha Sprang, MD;  Location: [place] [facility];  Service: Cardiovascular;  Laterality: N/A;    Current Outpatient Medications  Medication Sig Dispense Refill  . aspirin EC 81 MG EC tablet Take 1 tablet (81 mg total) by mouth daily. 30 tablet 11  . Calcium Carbonate-Vitamin D (CALCIUM-VITAMIN D) 500-200 MG-UNIT per tablet Take 1 tablet by mouth daily.    . clonazePAM (KLONOPIN) 1 MG tablet Take 2 tablets (2 mg total) by mouth at bedtime.    . clopidogrel (PLAVIX) 75 MG tablet TAKE  ONE TABLET BY MOUTH DAILY 90 tablet 1  . guaiFENesin (MUCINEX) 600 MG 12 hr tablet Take 600 mg by mouth 2 (two) times daily as needed for cough or to loosen phlegm.    . isosorbide mononitrate (IMDUR) 30 MG 24 hr tablet TAKE ONE TABLET BY MOUTH DAILY 90 tablet 2  . leuprolide (LUPRON) 22.5 MG injection Inject 22.5 mg into the muscle every 3 (three) months.    . levothyroxine (SYNTHROID, LEVOTHROID) 50  MCG tablet Take 50 mcg by mouth daily before breakfast.     . Melatonin 10 MG TABS Take 10 mg by mouth at bedtime.    . mineral oil liquid Take 45 mLs by mouth See admin instructions. Every three days for constipation    . nitroGLYCERIN (NITROSTAT) 0.4 MG SL tablet Place 1 tablet (0.4 mg total) under the tongue every 5 (five) minutes as needed for chest pain. 25 tablet 3  . Omega-3 Fatty Acids (FISH OIL) 1000 MG CAPS Take 1,000 mg by mouth daily.    . pantoprazole (PROTONIX) 40 MG tablet Take 40 mg by mouth 2 (two) times daily before a meal.    . simvastatin (ZOCOR) 40 MG tablet Take 40 mg by mouth at bedtime.    Marland Kitchen venlafaxine XR (EFFEXOR-XR) 75 MG 24 hr capsule Take 75 mg by mouth daily. Pt states taking one half tablet 75 mg by mouth daily.    . vitamin B-12 (CYANOCOBALAMIN) 1000 MCG tablet Take 1,000 mcg by mouth daily.     No current facility-administered medications for this encounter.    Allergies  Allergen Reactions  . Penicillins Other (See Comments)    Passed out  . Phenazopyridine Hcl Hives  . Ramipril Hives    Social History   Socioeconomic History  . Marital status: Married    Spouse name: Not on file  . Number of children: Not on file  . Years of education: Not on file  . Highest education level: Not on file  Occupational History  . Not on file  Tobacco Use  . Smoking status: Former Smoker    Packs/day: 1.00    Years: 5.00    Pack years: 5.00    Types: Cigarettes    Start date: 09/15/1951    Quit date: 06/14/1965    Years since quitting: 55.3  . Smokeless tobacco: Former Systems developer    Types: Chew    Quit date: 06/14/1978  . Tobacco comment: chewed tobacco 5 years 1PPD  Substance and Sexual Activity  . Alcohol use: No    Alcohol/week: 0.0 standard drinks  . Drug use: No  . Sexual activity: Not on file  Other Topics Concern  . Not on file  Social History Narrative  . Not on file   Social Determinants of Health   Financial Resource Strain: Not on file  Food  Insecurity: Not on file  Transportation Needs: Not on file  Physical Activity: Not on file  Stress: Not on file  Social Connections: Not on file  Intimate Partner Violence: Not on file     ROS- All systems are reviewed and negative except as per the HPI above.  Physical Exam: Vitals:   10/20/20 1430  BP: 114/60  Pulse: 79  Weight: 68.3 kg  Height: 5\' 9"  (1.753 m)    GEN- The patient is a well appearing elderly male, alert and oriented x 3 today.   Head- normocephalic, atraumatic Eyes-  Sclera clear, conjunctiva pink Ears- hearing intact Oropharynx- clear Neck- supple  Lungs-  Clear to ausculation bilaterally, normal work of breathing Heart- Regular rate and rhythm, no murmurs, rubs or gallops  GI- soft, NT, ND, + BS Extremities- no clubbing, cyanosis, or edema MS- no significant deformity or atrophy Skin- no rash or lesion Psych- euthymic mood, full affect Neuro- strength and sensation are intact  Wt Readings from Last 3 Encounters:  10/20/20 68.3 kg  09/08/20 67.2 kg  08/12/20 64.9 kg    EKG today demonstrates  A paced rhythm Vent. rate 79 BPM PR interval 166 ms QRS duration 76 ms QT/QTcB 394/451 ms  Echo 07/30/20 demonstrated  1. Left ventricular ejection fraction, by estimation, is 45 to 50%. The  left ventricle has low normal function. Left ventricular endocardial  border not optimally defined to evaluate regional wall motion. Left  ventricular diastolic parameters are  indeterminate. The average left ventricular global longitudinal strain is  -16.7 %.  2. Right ventricular systolic function is normal. The right ventricular  size is normal.  3. The mitral valve is normal in structure. Mild mitral valve  regurgitation. No evidence of mitral stenosis.  4. The aortic valve was not well visualized. There is mild calcification  of the aortic valve. There is mild thickening of the aortic valve. Aortic  valve regurgitation is mild to moderate. No aortic  stenosis is present.  5. The inferior vena cava is normal in size with greater than 50%  respiratory variability, suggesting right atrial pressure of 3 mmHg.   Comparison(s): Echocardiogram done 04/09/15 showed an EF of 55-60%.   Epic records are reviewed at length today  CHA2DS2-VASc Score = 5  [name] score is based upon: CHF History: No HTN History: No Diabetes History: No Stroke History: Yes Vascular Disease History: Yes Age Score: 2 Gender Score: 0      ASSESSMENT AND PLAN: 1. Paroxysmal Atrial Fibrillation (ICD10:  I48.0) [name] CHA2DS2-VASc score is 5, indicating a 7.2% annual risk of stroke.   General education about afib provided and questions answered. We also discussed his stroke risk and the risks and benefits of anticoagulation. Overall burden low at < 0.1% Will plan to start Eliquis 5 mg BID, recent blood work reviewed. Will likely stop ASA and continue Plavix with his h/o CAD, will d/w Dr Domenic Polite.   2. Secondary Hypercoagulable State (ICD10:  D68.69) The patient is at significant risk for stroke/thromboembolism based upon his CHA2DS2-VASc Score of 5.  Start Apixaban (Eliquis).   3. Sinus node dysfunction S/p PPM with recent lead failure requiring replacement. Followed by Dr Rayann Heman and the device clinic.  4. CAD No anginal symptoms.   Follow up with Dr Quentin Ore and Dr Domenic Polite as scheduled.    [facility] [address] [place], [place] [address] [phone] 10/20/2020 2:43 PM

## 2020-11-04 NOTE — Progress Notes (Signed)
Remote pacemaker transmission.   

## 2020-11-05 ENCOUNTER — Other Ambulatory Visit: Payer: Self-pay

## 2020-11-05 ENCOUNTER — Encounter (HOSPITAL_COMMUNITY): Payer: Self-pay | Admitting: Emergency Medicine

## 2020-11-05 ENCOUNTER — Emergency Department (HOSPITAL_COMMUNITY): Payer: Medicare Other

## 2020-11-05 ENCOUNTER — Emergency Department (HOSPITAL_COMMUNITY)
Admission: EM | Admit: 2020-11-05 | Discharge: 2020-11-05 | Disposition: A | Discharge status: Home / Self Care | Payer: Medicare Other | Attending: Emergency Medicine | Admitting: Emergency Medicine

## 2020-11-05 DIAGNOSIS — Z7902 Long term (current) use of antithrombotics/antiplatelets: Secondary | ICD-10-CM | POA: Diagnosis not present

## 2020-11-05 DIAGNOSIS — Z7901 Long term (current) use of anticoagulants: Secondary | ICD-10-CM | POA: Diagnosis not present

## 2020-11-05 DIAGNOSIS — I509 Heart failure, unspecified: Secondary | ICD-10-CM | POA: Diagnosis not present

## 2020-11-05 DIAGNOSIS — I2510 Atherosclerotic heart disease of native coronary artery without angina pectoris: Secondary | ICD-10-CM | POA: Diagnosis not present

## 2020-11-05 DIAGNOSIS — Z8546 Personal history of malignant neoplasm of prostate: Secondary | ICD-10-CM | POA: Insufficient documentation

## 2020-11-05 DIAGNOSIS — R079 Chest pain, unspecified: Secondary | ICD-10-CM | POA: Diagnosis present

## 2020-11-05 DIAGNOSIS — Z950 Presence of cardiac pacemaker: Secondary | ICD-10-CM | POA: Diagnosis not present

## 2020-11-05 DIAGNOSIS — Z951 Presence of aortocoronary bypass graft: Secondary | ICD-10-CM | POA: Diagnosis not present

## 2020-11-05 DIAGNOSIS — Z87891 Personal history of nicotine dependence: Secondary | ICD-10-CM | POA: Insufficient documentation

## 2020-11-05 DIAGNOSIS — Z7982 Long term (current) use of aspirin: Secondary | ICD-10-CM | POA: Diagnosis not present

## 2020-11-05 LAB — CBC
HCT: 36.3 % — ABNORMAL LOW (ref 39.0–52.0)
Hemoglobin: 11.4 g/dL — ABNORMAL LOW (ref 13.0–17.0)
MCH: 29 pg (ref 26.0–34.0)
MCHC: 31.4 g/dL (ref 30.0–36.0)
MCV: 92.4 fL (ref 80.0–100.0)
Platelets: 204 10*3/uL (ref 150–400)
RBC: 3.93 MIL/uL — ABNORMAL LOW (ref 4.22–5.81)
RDW: 13.7 % (ref 11.5–15.5)
WBC: 8.6 10*3/uL (ref 4.0–10.5)
nRBC: 0 % (ref 0.0–0.2)

## 2020-11-05 LAB — TROPONIN I (HIGH SENSITIVITY)
Troponin I (High Sensitivity): 5 ng/L (ref <18)
Troponin I (High Sensitivity): 5 ng/L (ref <18)
Troponin I (High Sensitivity): 5 ng/L (ref <18)

## 2020-11-05 LAB — BASIC METABOLIC PANEL
Anion gap: 7 (ref 5–15)
BUN: 13 mg/dL (ref 8–23)
CO2: 27 mmol/L (ref 22–32)
Calcium: 9.5 mg/dL (ref 8.9–10.3)
Chloride: 101 mmol/L (ref 98–111)
Creatinine, Ser: 0.97 mg/dL (ref 0.61–1.24)
GFR, Estimated: >60 mL/min (ref >60)
Glucose, Bld: 100 mg/dL — ABNORMAL HIGH (ref 70–99)
Potassium: 3.9 mmol/L (ref 3.5–5.1)
Sodium: 135 mmol/L (ref 135–145)

## 2020-11-05 LAB — D-DIMER, QUANTITATIVE: D-Dimer, Quant: 0.55 ug/mL-FEU — ABNORMAL HIGH (ref 0.00–0.50)

## 2020-11-05 MED ORDER — HYDROCODONE-ACETAMINOPHEN 5-325 MG PO TABS
1.0000 | ORAL_TABLET | Freq: Once | ORAL | Status: AC
Start: 2020-11-05 — End: 2020-11-05
  Administered 2020-11-05: 1 via ORAL
  Filled 2020-11-05: qty 1

## 2020-11-05 NOTE — ED Triage Notes (Signed)
Pt reports cp since 0830 this am. Pt took 2 sl nitro and 1 81mg  asa pta.

## 2020-11-05 NOTE — ED Provider Notes (Signed)
[facility] Provider Note   CSN: [id number] Arrival date & time: 11/05/20  1114     History Chief Complaint  Patient presents with  . Chest Pain    Russell Browning. is a 80 y.o. male.  HPI  HPI: A 80 year old patient with a history of CVA and hypercholesterolemia presents for evaluation of chest pain. Initial onset of pain was more than 6 hours ago. [name] chest pain is described as heaviness/pressure/tightness, is not worse with exertion and is relieved by nitroglycerin. [name] chest pain is middle- or left-sided, is not well-localized, is not sharp and does radiate to the arms/jaw/neck. The patient does not complain of nausea and denies diaphoresis. The patient has no history of peripheral artery disease, has not smoked in the past 90 days, denies any history of treated diabetes, has no relevant family history of coronary artery disease (first degree relative at less than age 72), is not hypertensive and does not have an elevated BMI (>=30).   Past Medical History:  Diagnosis Date  . Anemia   . CAD (coronary artery disease), native coronary artery    a. DES to proximal, mid, and distal RCA 2006. b. 12/2014 - [place] s/p  PTCA to the mid-distal previously placed RCA stents, otherwise nonobstructive CAD for continued medical therapy.  . CHF (congestive heart failure) ([place])   . History of TIAs   . Mixed hyperlipidemia   . 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 Prostate cancer metastatic to bone ([place])   . Sinus node dysfunction [facility])    Medtronic pacemaker - Dr. Rayann Heman    Patient Active Problem List   Diagnosis Date Noted  . Paroxysmal atrial fibrillation ([place]) 10/20/2020  . Secondary hypercoagulable state ([place]) 10/20/2020  . Anginal chest pain at rest [facility])   . Chest pain 01/03/2015  . Unstable angina ([place]) 01/03/2015  .  Prostate cancer ([place]) 11/30/2011  . Pacemaker lead failure   . CAD (coronary artery disease), native coronary artery 05/06/2010  . CARDIAC PACEMAKER IN SITU 05/01/2009  . Mixed hyperlipidemia 10/04/2008  . Sinoatrial node dysfunction ([place]) 10/04/2008    Past Surgical History:  Procedure Laterality Date  . CARDIAC CATHETERIZATION N/A 01/06/2015   Procedure: Left Heart Cath and Coronary Angiography;  Surgeon: Troy Sine, MD;  Location: [facility];  Service: Cardiovascular;  Laterality: N/A;  . LEAD REVISION/REPAIR N/A 08/25/2020   Procedure: LEAD REVISION/REPAIR;  Surgeon: Vickie Epley, MD;  Location: [facility];  Service: Cardiovascular;  Laterality: N/A;  . PACEMAKER IMPLANT N/A 08/25/2020   Procedure: PACEMAKER IMPLANT;  Surgeon: Vickie Epley, MD;  Location: [facility];  Service: Cardiovascular;  Laterality: N/A;  . PACEMAKER INSERTION     MDT  . PPM GENERATOR CHANGEOUT N/A 04/11/2020   Procedure: PPM GENERATOR CHANGEOUT;  Surgeon: Deboraha Sprang, MD;  Location: [place] [facility];  Service: Cardiovascular;  Laterality: N/A;       Family History  Problem Relation Age of Onset  . Heart disease Sister   . Heart disease Father     Social History   Tobacco Use  . Smoking status: Former Smoker    Packs/day: 1.00    Years: 5.00    Pack years: 5.00    Types: Cigarettes    Start date: 09/15/1951    Quit date: 06/14/1965  Years since quitting: 55.4  . Smokeless tobacco: Former Systems developer    Types: Chew    Quit date: 06/14/1978  . Tobacco comment: chewed tobacco 5 years 1PPD  Substance Use Topics  . Alcohol use: No    Alcohol/week: 0.0 standard drinks  . Drug use: No    Home Medications Prior to Admission medications   Medication Sig Start Date End Date Taking? Authorizing Provider  aspirin EC 81 MG EC tablet Take 1 tablet (81 mg total) by mouth daily. 01/07/15  Yes Dunn, Dayna N, PA-C  nitroGLYCERIN (NITROSTAT) 0.4 MG SL tablet Place 1 tablet  (0.4 mg total) under the tongue every 5 (five) minutes as needed for chest pain. 07/25/17  Yes Satira Sark, MD  apixaban (ELIQUIS) 5 MG TABS tablet Take 1 tablet (5 mg total) by mouth 2 (two) times daily. 10/20/20   Fenton, Clint R, PA  Calcium Carbonate-Vitamin D (CALCIUM-VITAMIN D) 500-200 MG-UNIT per tablet Take 1 tablet by mouth daily.    [provider]  clonazePAM (KLONOPIN) 1 MG tablet Take 2 tablets (2 mg total) by mouth at bedtime.    [provider]  clopidogrel (PLAVIX) 75 MG tablet TAKE ONE TABLET BY MOUTH DAILY 06/09/20   Satira Sark, MD  guaiFENesin (MUCINEX) 600 MG 12 hr tablet Take 600 mg by mouth 2 (two) times daily as needed for cough or to loosen phlegm.    [provider]  isosorbide mononitrate (IMDUR) 30 MG 24 hr tablet TAKE ONE TABLET BY MOUTH DAILY 10/13/20   Satira Sark, MD  leuprolide (LUPRON) 22.5 MG injection Inject 22.5 mg into the muscle every 3 (three) months.    [provider]  levothyroxine (SYNTHROID, LEVOTHROID) 50 MCG tablet Take 50 mcg by mouth daily before breakfast.  01/17/15   [provider]  Melatonin 10 MG TABS Take 10 mg by mouth at bedtime.    [provider]  mineral oil liquid Take 45 mLs by mouth See admin instructions. Every three days for constipation    [provider]  Omega-3 Fatty Acids (FISH OIL) 1000 MG CAPS Take 1,000 mg by mouth daily.    [provider]  pantoprazole (PROTONIX) 40 MG tablet Take 40 mg by mouth 2 (two) times daily before a meal.    [provider]  simvastatin (ZOCOR) 40 MG tablet Take 40 mg by mouth at bedtime.    [provider]  venlafaxine XR (EFFEXOR-XR) 75 MG 24 hr capsule Take 75 mg by mouth daily. Pt states taking one half tablet 75 mg by mouth daily. 01/24/12   [provider]  vitamin B-12 (CYANOCOBALAMIN) 1000 MCG tablet Take 1,000 mcg by mouth daily.    [provider]    Allergies     Penicillins, Phenazopyridine hcl, and Ramipril  Review of Systems   Review of Systems  All other systems reviewed and are negative.   Physical Exam Updated Vital Signs BP 127/69   Pulse (!) 57   Temp 98.8 F (37.1 C) (Oral)   Resp 19   Ht 1.765 m (5' 9.5")   Wt 71.8 kg   SpO2 95%   BMI 23.05 kg/m   Physical Exam Vitals and nursing note reviewed.  Constitutional:      General: He is not in acute distress.    Appearance: He is well-developed.  HENT:     Head: Normocephalic and atraumatic.     Right Ear: External ear normal.     Left  Ear: External ear normal.  Eyes:     General: No scleral icterus.       Right eye: No discharge.        Left eye: No discharge.     Conjunctiva/sclera: Conjunctivae normal.  Neck:     Trachea: No tracheal deviation.  Cardiovascular:     Rate and Rhythm: Normal rate and regular rhythm.  Pulmonary:     Effort: Pulmonary effort is normal. No respiratory distress.     Breath sounds: Normal breath sounds. No stridor. No wheezing or rales.  Abdominal:     General: Bowel sounds are normal. There is no distension.     Palpations: Abdomen is soft.     Tenderness: There is no abdominal tenderness. There is no guarding or rebound.  Musculoskeletal:        General: No tenderness.     Cervical back: Neck supple.  Skin:    General: Skin is warm and dry.     Findings: No rash.  Neurological:     Mental Status: He is alert.     Cranial Nerves: No cranial nerve deficit (no facial droop, extraocular movements intact, no slurred speech).     Sensory: No sensory deficit.     Motor: No abnormal muscle tone or seizure activity.     Coordination: Coordination normal.     ED Results / Procedures / Treatments   Labs (all labs ordered are listed, but only abnormal results are displayed) Labs Reviewed  BASIC METABOLIC PANEL - Abnormal; Notable for the following components:      Result Value   Glucose, Bld 100 (*)    All other components within  normal limits  CBC - Abnormal; Notable for the following components:   RBC 3.93 (*)    Hemoglobin 11.4 (*)    HCT 36.3 (*)    All other components within normal limits  D-DIMER, QUANTITATIVE - Abnormal; Notable for the following components:   D-Dimer, Quant 0.55 (*)    All other components within normal limits  TROPONIN I (HIGH SENSITIVITY)  TROPONIN I (HIGH SENSITIVITY)  TROPONIN I (HIGH SENSITIVITY)    EKG EKG Interpretation  Date/Time:  Wednesday Nov 05 2020 11:23:43 EDT Ventricular Rate:  61 PR Interval:  170 QRS Duration: 74 QT Interval:  418 QTC Calculation: 420 R Axis:   33 Text Interpretation: Atrial-paced rhythm Abnormal ECG No significant change since last tracing Confirmed by [name] [phone]) on 11/05/2020 3:02:05 PM   Radiology DG Chest 2 View  Result Date: 11/05/2020 CLINICAL DATA:  Chest pain. EXAM: CHEST - 2 VIEW COMPARISON:  August 25, 2020. FINDINGS: Similar cardiomediastinal silhouette, which is within normal limits. Aortic atherosclerosis. Right subclavian approach cardiac rhythm maintenance device. Similar retained left leads from prior cardiac rhythm maintenance device. Both lungs are clear. No visible pleural effusions or pneumothorax. Similar left greater than right biapical pleuroparenchymal scarring. No acute osseous abnormality. Osteopenia. IMPRESSION: No evidence of acute cardiopulmonary disease. Electronically Signed   By: Margaretha Sheffield MD   On: 11/05/2020 12:02    Procedures Procedures   Medications Ordered in ED Medications  HYDROcodone-acetaminophen (NORCO/VICODIN) 5-325 MG per tablet 1 tablet (1 tablet Oral Given 11/05/20 1553)    ED Course  I have reviewed the triage vital signs and the nursing notes.  Pertinent labs & imaging results that were available during my care of the patient were reviewed by me and considered in my medical decision making (see chart for details).    MDM Rules/Calculators/A&P  HEAR Score: 6                         Patient presents to the ED for evaluation of chest pain.  Patient symptoms have resolved in the ED.  He does have history of coronary artery disease with prior stents.  Patient's ED work-up is reassuring.  Initial troponins are normal.  No signs of cardiac ischemia.  No signs of pneumonia.  Doubt PE based on his D-dimer and symptoms.  Discussed options with the patient including admitting to the hospital for serial observation and cardiology evaluation.  Patient is feeling well and is comfortable with going home.  Stressed the importance of close follow-up with his cardiologist and return to the ED for any recurrent symptoms. Final Clinical Impression(s) / ED Diagnoses Final diagnoses:  Chest pain, unspecified type    Rx / DC Orders ED Discharge Orders    None       [name], MD 11/05/20 1734

## 2020-11-05 NOTE — Discharge Instructions (Addendum)
Continue your current medications.  Continue your aspirin and nitroglycerin as needed.  Return to the ED for recurrent symptoms.  Call your cardiologist office tomorrow to schedule a follow-up appointment.

## 2020-11-05 NOTE — ED Provider Notes (Signed)
Emergency Medicine Provider Triage Evaluation Note  Russell Browning. , a 80 y.o. male  was evaluated in triage.  Pt complains of chest pain, severe earlier this am.  Started again while here..  Review of Systems  Positive: Shortness of breath Negative: No fever  Physical Exam  BP (!) 142/74 (BP Location: Right Arm)   Pulse 62   Temp 98.4 F (36.9 C) (Oral)   Resp 20   Ht 1.765 m (5' 9.5")   Wt 71.8 kg   SpO2 97%   BMI 23.05 kg/m  Gen:   Awake, no distress   Resp:  Normal effort  MSK:   Moves extremities without difficulty  Other:  No erythema, chest wall  Medical Decision Making  Medically screening exam initiated at 3:05 PM.  Appropriate orders placed.  Russell A Antonopoulos Sr. was informed that the remainder of the evaluation will be completed by another provider, this initial triage assessment does not replace that evaluation, and the importance of remaining in the ED until their evaluation is complete.  Proceed with cardiac workup.  Initial EKG without signs of stemi.   Russell Rank, MD 11/05/20 [phone]

## 2020-11-27 NOTE — Progress Notes (Signed)
Electrophysiology Office Follow up Visit Note:    Date:  11/28/2020   ID:  Russell Flattery Sr., DOB [birth year], MRN [id number]  PCP:  Caryl Bis, MD  [facility] Cardiologist:  Rozann Lesches, MD  [facility] Electrophysiologist:  Thompson Grayer, MD    Interval History:    Russell Hosie. is a 80 y.o. male who presents for a follow up visit.  They were last seen in clinic August 12, 2020 for failure of his pacemaker system.  He underwent removal of the left-sided pacemaker generator, capping of the left-sided pacemaker leads and implantation of a right-sided dual-chamber permanent pacemaker system.  He has done well after the implant. Since the procedure, atrial fibrillation has been detected on his device.  He was referred to the atrial fibrillation clinic where he met with Adline Peals, PA-C.  The patient was prescribed Eliquis but he has not started the medication because of cost.  He is also not interested in starting Coumadin given her prior experience with the medication.    Past Medical History:  Diagnosis Date   Anemia    CAD (coronary artery disease), native coronary artery    a. DES to proximal, mid, and distal RCA 2006. b. 12/2014 - [place] s/p  PTCA to the mid-distal previously placed RCA stents, otherwise nonobstructive CAD for continued medical therapy.   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 Rayann Heman    Past Surgical History:  Procedure Laterality Date   CARDIAC CATHETERIZATION N/A 01/06/2015   Procedure: Left Heart Cath and Coronary Angiography;  Surgeon: Troy Sine, MD;  Location: [place] [facility];  Service: Cardiovascular;  Laterality: N/A;   LEAD  REVISION/REPAIR N/A 08/25/2020   Procedure: LEAD REVISION/REPAIR;  Surgeon: Vickie Epley, MD;  Location: [place] [facility];  Service: Cardiovascular;  Laterality: N/A;   PACEMAKER IMPLANT N/A 08/25/2020   Procedure: PACEMAKER IMPLANT;  Surgeon: Vickie Epley, MD;  Location: [place] [facility];  Service: Cardiovascular;  Laterality: N/A;   PACEMAKER INSERTION     MDT   PPM GENERATOR CHANGEOUT N/A 04/11/2020   Procedure: PPM GENERATOR CHANGEOUT;  Surgeon: Deboraha Sprang, MD;  Location: [place] [facility];  Service: Cardiovascular;  Laterality: N/A;    Current Medications: Current Meds  Medication Sig   aspirin EC 81 MG EC tablet Take 1 tablet (81 mg total) by mouth daily.   Calcium Carbonate-Vitamin D (CALCIUM-VITAMIN D) 500-200 MG-UNIT per tablet Take 1 tablet by mouth daily.   clonazePAM (KLONOPIN) 1 MG tablet Take 2 tablets (2 mg total) by mouth at bedtime.   clopidogrel (PLAVIX) 75 MG tablet TAKE ONE TABLET BY MOUTH DAILY   guaiFENesin (MUCINEX) 600 MG 12 hr tablet Take 600 mg by mouth 2 (two) times daily as needed for cough or to loosen phlegm.   isosorbide mononitrate (IMDUR) 30 MG 24 hr tablet TAKE ONE TABLET BY MOUTH DAILY   leuprolide (LUPRON) 22.5 MG injection Inject 22.5 mg into the muscle every 3 (three) months.   levothyroxine (SYNTHROID, LEVOTHROID) 50 MCG tablet Take 50 mcg by mouth daily before breakfast.    Melatonin 10 MG  TABS Take 10 mg by mouth at bedtime.   mineral oil liquid Take 45 mLs by mouth See admin instructions. Every three days for constipation   nitroGLYCERIN (NITROSTAT) 0.4 MG SL tablet Place 1 tablet (0.4 mg total) under the tongue every 5 (five) minutes as needed for chest pain.   pantoprazole (PROTONIX) 40 MG tablet Take 40 mg by mouth 2 (two) times daily before a meal.   simvastatin (ZOCOR) 40 MG tablet Take 40 mg by mouth at bedtime.   venlafaxine XR (EFFEXOR-XR) 75 MG 24 hr capsule Take 75 mg by mouth daily. Pt states taking one half tablet 75  mg by mouth daily.   vitamin B-12 (CYANOCOBALAMIN) 1000 MCG tablet Take 1,000 mcg by mouth daily.   [DISCONTINUED] apixaban (ELIQUIS) 5 MG TABS tablet Take 1 tablet (5 mg total) by mouth 2 (two) times daily.   [DISCONTINUED] Omega-3 Fatty Acids (FISH OIL) 1000 MG CAPS Take 1,000 mg by mouth daily.     Allergies:   Penicillins, Phenazopyridine hcl, and Ramipril   Social History   Socioeconomic History   Marital status: Married    Spouse name: Not on file   Number of children: Not on file   Years of education: Not on file   Highest education level: Not on file  Occupational History   Not on file  Tobacco Use   Smoking status: Former    Packs/day: 1.00    Years: 5.00    Pack years: 5.00    Types: Cigarettes    Start date: 09/15/1951    Quit date: 06/14/1965    Years since quitting: 55.4   Smokeless tobacco: Former    Types: Chew    Quit date: 06/14/1978   Tobacco comments:    chewed tobacco 5 years 1PPD  Substance and Sexual Activity   Alcohol use: No    Alcohol/week: 0.0 standard drinks   Drug use: No   Sexual activity: Not on file  Other Topics Concern   Not on file  Social History Narrative   Not on file   Social Determinants of Health   Financial Resource Strain: Not on file  Food Insecurity: Not on file  Transportation Needs: Not on file  Physical Activity: Not on file  Stress: Not on file  Social Connections: Not on file     Family History: [name] family history includes Heart disease in his father and sister.  ROS:   Please see the history of present illness.    All other systems reviewed and are negative.  EKGs/Labs/Other Studies Reviewed:    The following studies were reviewed today:  Remote interrogations personally reviewed showing new diagnosis atrial fibrillation.   November 28, 2020 in clinic device interrogation personally reviewed Battery longevity 13.4 years Lead parameters stable Atrial fibrillation burden 0.2% Atrially pacing  68.8% Ventricular pacing less than 0.1%   EKG:  The ekg ordered today demonstrates sinus rhythm.  Recent Labs: 09/08/2020: ALT 13 11/05/2020: BUN 13; Creatinine, Ser 0.97; Hemoglobin 11.4; Platelets 204; Potassium 3.9; Sodium 135  Recent Lipid Panel    Component Value Date/Time   CHOL 171 01/04/2015 0431   TRIG 106 01/04/2015 0431   HDL 42 01/04/2015 0431   CHOLHDL 4.1 01/04/2015 0431   VLDL 21 01/04/2015 0431   LDLCALC 108 (H) 01/04/2015 0431    Physical Exam:    VS:  BP 116/68   Pulse 78   Ht 5' 9" (1.753 m)   Wt 149 lb (67.6 kg)   BMI  22.00 kg/m     Wt Readings from Last 3 Encounters:  11/28/20 149 lb (67.6 kg)  11/05/20 158 lb 6 oz (71.8 kg)  10/20/20 150 lb 9.6 oz (68.3 kg)     GEN:  Well nourished, well developed in no acute distress HEENT: Normal NECK: No JVD; No carotid bruits LYMPHATICS: No lymphadenopathy CARDIAC: RRR, no murmurs, rubs, gallops.  Incisions on bilateral chest wall well-healed. RESPIRATORY:  Clear to auscultation without rales, wheezing or rhonchi  ABDOMEN: Soft, non-tender, non-distended MUSCULOSKELETAL:  No edema; No deformity  SKIN: Warm and dry NEUROLOGIC:  Alert and oriented x 3 PSYCHIATRIC:  Normal affect   ASSESSMENT:    1. Paroxysmal atrial fibrillation (HCC)   2. Nonischemic cardiomyopathy ([place])   3. Sinoatrial node dysfunction (HCC)   4. Cardiac pacemaker in situ    PLAN:    In order of problems listed above:  Paroxysmal atrial fibrillation Patient has overall very low burden of A. fib.  Episodes are relatively brief, typically less than 1 hour.  He is asymptomatic.  Previously had prescribed Eliquis but he is unable to afford the medication and so has not started it.  He is not interested in taking Coumadin for stroke prophylaxis.  I discussed the risk of stroke related to atrial fibrillation and the patient expresses understanding but again wishes to continue taking aspirin and Plavix rather than an anticoagulant.  We  will plan to continue monitoring the burden of A. fib.  2.  Nonischemic cardiomyopathy NYHA class II.  Warm and dry.   3.  Sinus no dysfunction Pacemaker functioning well.  He is feeling better since the pacemaker implant on the right side.  Follow-up 1 year or sooner as needed.  Medication Adjustments/Labs and Tests Ordered: Current medicines are reviewed at length with the patient today.  Concerns regarding medicines are outlined above.  No orders of the defined types were placed in this encounter.  No orders of the defined types were placed in this encounter.    Signed, Lars Mage, MD, [facility], [facility] 11/28/2020 2:00 PM    Electrophysiology [facility]

## 2020-11-28 ENCOUNTER — Other Ambulatory Visit: Payer: Self-pay

## 2020-11-28 ENCOUNTER — Ambulatory Visit (INDEPENDENT_AMBULATORY_CARE_PROVIDER_SITE_OTHER): Payer: Medicare Other | Admitting: Cardiology

## 2020-11-28 ENCOUNTER — Encounter: Payer: Self-pay | Admitting: Cardiology

## 2020-11-28 VITALS — BP 116/68 | HR 78 | Ht 69.0 in | Wt 149.0 lb

## 2020-11-28 DIAGNOSIS — Z950 Presence of cardiac pacemaker: Secondary | ICD-10-CM | POA: Diagnosis not present

## 2020-11-28 DIAGNOSIS — I495 Sick sinus syndrome: Secondary | ICD-10-CM

## 2020-11-28 DIAGNOSIS — I428 Other cardiomyopathies: Secondary | ICD-10-CM

## 2020-11-28 DIAGNOSIS — I480 Paroxysmal atrial fibrillation: Secondary | ICD-10-CM | POA: Diagnosis not present

## 2020-11-28 NOTE — Patient Instructions (Signed)
Medication Instructions:  No changes *If you need a refill on your cardiac medications before your next appointment, please call your pharmacy*   Lab Work: none If you have labs (blood work) drawn today and your tests are completely normal, you will receive your results only by: [place] (if you have MyChart) OR A paper copy in the mail If you have any lab test that is abnormal or we need to change your treatment, we will call you to review the results.   Testing/Procedures: none   Follow-Up: At [facility], you and your health needs are our priority.  As part of our continuing mission to provide you with exceptional heart care, we have created designated Provider Care Teams.  These Care Teams include your primary Cardiologist (physician) and Advanced Practice Providers (APPs -  Physician Assistants and Nurse Practitioners) who all work together to provide you with the care you need, when you need it.  We recommend signing up for the patient portal called "MyChart".  Sign up information is provided on this After Visit Summary.  MyChart is used to connect with patients for Virtual Visits (Telemedicine).  Patients are able to view lab/test results, encounter notes, upcoming appointments, etc.  Non-urgent messages can be sent to your provider as well.   To learn more about what you can do with MyChart, go to [email or web address].    Your next appointment:   12 months  The format for your next appointment:   In Person  Provider:   You may see Lars Mage, MD or one of the following Advanced Practice Providers on your designated Care Team:    Tommye Standard, [place] Legrand Como "Jonni Sanger" Chalmers Cater, [place]   Other Instructions

## 2020-12-10 ENCOUNTER — Inpatient Hospital Stay: Payer: Medicare Other | Attending: Oncology

## 2020-12-10 ENCOUNTER — Inpatient Hospital Stay (HOSPITAL_BASED_OUTPATIENT_CLINIC_OR_DEPARTMENT_OTHER): Payer: Medicare Other | Admitting: Oncology

## 2020-12-10 ENCOUNTER — Other Ambulatory Visit: Payer: Self-pay

## 2020-12-10 ENCOUNTER — Other Ambulatory Visit: Payer: Self-pay | Admitting: Cardiology

## 2020-12-10 ENCOUNTER — Inpatient Hospital Stay: Payer: Medicare Other

## 2020-12-10 VITALS — BP 121/62 | HR 84 | Temp 97.7°F | Resp 17 | Ht 69.0 in | Wt 150.2 lb

## 2020-12-10 DIAGNOSIS — Z5111 Encounter for antineoplastic chemotherapy: Secondary | ICD-10-CM | POA: Diagnosis not present

## 2020-12-10 DIAGNOSIS — Z923 Personal history of irradiation: Secondary | ICD-10-CM | POA: Diagnosis not present

## 2020-12-10 DIAGNOSIS — Z79899 Other long term (current) drug therapy: Secondary | ICD-10-CM | POA: Insufficient documentation

## 2020-12-10 DIAGNOSIS — Z880 Allergy status to penicillin: Secondary | ICD-10-CM | POA: Insufficient documentation

## 2020-12-10 DIAGNOSIS — C61 Malignant neoplasm of prostate: Secondary | ICD-10-CM

## 2020-12-10 DIAGNOSIS — R232 Flushing: Secondary | ICD-10-CM | POA: Diagnosis not present

## 2020-12-10 DIAGNOSIS — C7951 Secondary malignant neoplasm of bone: Secondary | ICD-10-CM | POA: Diagnosis not present

## 2020-12-10 DIAGNOSIS — M898X9 Other specified disorders of bone, unspecified site: Secondary | ICD-10-CM | POA: Insufficient documentation

## 2020-12-10 DIAGNOSIS — T82110A Breakdown (mechanical) of cardiac electrode, initial encounter: Secondary | ICD-10-CM

## 2020-12-10 LAB — CMP (CANCER CENTER ONLY)
ALT: 9 U/L (ref 0–44)
AST: 18 U/L (ref 15–41)
Albumin: 3.9 g/dL (ref 3.5–5.0)
Alkaline Phosphatase: 34 U/L — ABNORMAL LOW (ref 38–126)
Anion gap: 8 (ref 5–15)
BUN: 9 mg/dL (ref 8–23)
CO2: 27 mmol/L (ref 22–32)
Calcium: 9.5 mg/dL (ref 8.9–10.3)
Chloride: 105 mmol/L (ref 98–111)
Creatinine: 1.29 mg/dL — ABNORMAL HIGH (ref 0.61–1.24)
GFR, Estimated: 56 mL/min — ABNORMAL LOW (ref >60)
Glucose, Bld: 78 mg/dL (ref 70–99)
Potassium: 4.8 mmol/L (ref 3.5–5.1)
Sodium: 140 mmol/L (ref 135–145)
Total Bilirubin: 0.4 mg/dL (ref 0.3–1.2)
Total Protein: 6.6 g/dL (ref 6.5–8.1)

## 2020-12-10 LAB — CBC WITH DIFFERENTIAL (CANCER CENTER ONLY)
Abs Immature Granulocytes: 0.01 10*3/uL (ref 0.00–0.07)
Basophils Absolute: 0 10*3/uL (ref 0.0–0.1)
Basophils Relative: 1 %
Eosinophils Absolute: 0.2 10*3/uL (ref 0.0–0.5)
Eosinophils Relative: 4 %
HCT: 33.4 % — ABNORMAL LOW (ref 39.0–52.0)
Hemoglobin: 11.1 g/dL — ABNORMAL LOW (ref 13.0–17.0)
Immature Granulocytes: 0 %
Lymphocytes Relative: 32 %
Lymphs Abs: 1.6 10*3/uL (ref 0.7–4.0)
MCH: 29.2 pg (ref 26.0–34.0)
MCHC: 33.2 g/dL (ref 30.0–36.0)
MCV: 87.9 fL (ref 80.0–100.0)
Monocytes Absolute: 0.5 10*3/uL (ref 0.1–1.0)
Monocytes Relative: 11 %
Neutro Abs: 2.6 10*3/uL (ref 1.7–7.7)
Neutrophils Relative %: 52 %
Platelet Count: 159 10*3/uL (ref 150–400)
RBC: 3.8 MIL/uL — ABNORMAL LOW (ref 4.22–5.81)
RDW: 13.8 % (ref 11.5–15.5)
WBC Count: 4.9 10*3/uL (ref 4.0–10.5)
nRBC: 0 % (ref 0.0–0.2)

## 2020-12-10 MED ORDER — LEUPROLIDE ACETATE (3 MONTH) 22.5 MG ~~LOC~~ KIT
22.5000 mg | PACK | Freq: Once | SUBCUTANEOUS | Status: AC
  Administered 2020-12-10: 22.5 mg via SUBCUTANEOUS

## 2020-12-10 MED ORDER — LEUPROLIDE ACETATE (3 MONTH) 22.5 MG ~~LOC~~ KIT
PACK | SUBCUTANEOUS | Status: AC
  Filled 2020-12-10: qty 22.5

## 2020-12-10 NOTE — Progress Notes (Signed)
Hematology and Oncology Follow Up Visit  Saksham Akkerman [id number] Aug 22, 1940 80 y.o. 12/10/2020 9:06 AM    Principle Diagnosis:  80 year old man with advanced prostate cancer with disease to the bone diagnosed in 2013.  He has castration-sensitive disease after presenting with localized tumor in the year 2000.   Prior Therapy: 1. He is status post radiation therapy and 2 years of Lupron for locally advanced prostate cancer at the time of diagnosis in 2000.  2.  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  He was started on Lupron at that time.  3. He is S/P radiation therapy directed to the right hip (30 gray in 12 fractions) concluded in 07/2011 for palliative purposes.   4. Xgeva given every 3 months started in 10/2011.  Therapy discontinued due to based on patient's preference.  Current therapy:   Eligard every 3 months.  He will receive injection today and repeated in 6 months.   Interim History:  Mr. Hillmer is here for a follow-up visit.  Since her last visit, he has tolerated Eligard without any major complaints.  He denies any nausea, vomiting or bone pain.  He denies any worsening fatigue or urinary symptoms.  He does report some occasional hot flashes but overall manageable.  His quality of life is not dramatically different at this time.      Medications: Reviewed without changes. Current Outpatient Medications  Medication Sig Dispense Refill   aspirin EC 81 MG EC tablet Take 1 tablet (81 mg total) by mouth daily. 30 tablet 11   Calcium Carbonate-Vitamin D (CALCIUM-VITAMIN D) 500-200 MG-UNIT per tablet Take 1 tablet by mouth daily.     clonazePAM (KLONOPIN) 1 MG tablet Take 2 tablets (2 mg total) by mouth at bedtime.     clopidogrel (PLAVIX) 75 MG tablet TAKE ONE TABLET BY MOUTH DAILY 90 tablet 1   guaiFENesin (MUCINEX) 600 MG 12 hr tablet Take 600 mg by mouth 2 (two) times daily as needed for cough or to loosen  phlegm.     isosorbide mononitrate (IMDUR) 30 MG 24 hr tablet TAKE ONE TABLET BY MOUTH DAILY 90 tablet 2   leuprolide (LUPRON) 22.5 MG injection Inject 22.5 mg into the muscle every 3 (three) months.     levothyroxine (SYNTHROID, LEVOTHROID) 50 MCG tablet Take 50 mcg by mouth daily before breakfast.      Melatonin 10 MG TABS Take 10 mg by mouth at bedtime.     mineral oil liquid Take 45 mLs by mouth See admin instructions. Every three days for constipation     nitroGLYCERIN (NITROSTAT) 0.4 MG SL tablet Place 1 tablet (0.4 mg total) under the tongue every 5 (five) minutes as needed for chest pain. 25 tablet 3   pantoprazole (PROTONIX) 40 MG tablet Take 40 mg by mouth 2 (two) times daily before a meal.     simvastatin (ZOCOR) 40 MG tablet Take 40 mg by mouth at bedtime.     venlafaxine XR (EFFEXOR-XR) 75 MG 24 hr capsule Take 75 mg by mouth daily. Pt states taking one half tablet 75 mg by mouth daily.     vitamin B-12 (CYANOCOBALAMIN) 1000 MCG tablet Take 1,000 mcg by mouth daily.     No current facility-administered medications for this visit.     Allergies:  Allergies  Allergen Reactions   Penicillins Other (See Comments)    Passed out   Phenazopyridine Hcl Hives  Ramipril Hives       Physical Exam:   Blood pressure 121/62, pulse 84, temperature 97.7 F (36.5 C), temperature source Tympanic, resp. rate 17, height 5\' 9"  (1.753 m), weight 150 lb 3.2 oz (68.1 kg), SpO2 99 %.        ECOG: 1    General appearance: Comfortable appearing without any discomfort Head: Normocephalic without any trauma Oropharynx: Mucous membranes are moist and pink without any thrush or ulcers. Eyes: Pupils are equal and round reactive to light. Lymph nodes: No cervical, supraclavicular, inguinal or axillary lymphadenopathy.   Heart:regular rate and rhythm.  S1 and S2 without leg edema. Lung: Clear without any rhonchi or wheezes.  No dullness to percussion. Abdomin: Soft, nontender,  nondistended with good bowel sounds.  No hepatosplenomegaly. Musculoskeletal: No joint deformity or effusion.  Full range of motion noted. Neurological: No deficits noted on motor, sensory and deep tendon reflex exam. Skin: No petechial rash or dryness.  Appeared moist.              Lab Results: Lab Results  Component Value Date   WBC 8.6 11/05/2020   HGB 11.4 (L) 11/05/2020   HCT 36.3 (L) 11/05/2020   MCV 92.4 11/05/2020   PLT 204 11/05/2020        Results for EBER, FERRUFINO SR. (MRN [id number]) as of 12/10/2020 09:06  Ref. Range 02/21/2020 09:21 05/21/2020 09:25 09/08/2020 09:50  Prostate Specific Ag, Serum Latest Ref Range: 0.0 - 4.0 ng/mL 0.3 0.3 0.4             Impression and Plan:   80 year old man with:   1.  Advanced prostate cancer with disease to the bone diagnosed in 2013.  He has castration-sensitive disease at this time.  The natural course of this disease was reviewed at this time and treatment choices were discussed.  He has experiencing rather slow rise in his PSA although he continues to be asymptomatic.  Therapy escalation including androgen receptors pathway inhibitors Taxotere chemotherapy would be considered if he has rapid PSA rise.  Given his age and comorbidities would like to defer any additional treatment unless needed.  2. Bone pain: No issues reported at this time.  3. Bone directed therapy: He continues to be on calcium and vitamin D supplements.  He has declined any further Xgeva injections.  4. Androgen depravation: He is currently on Eligard which she has tolerated reasonably well.  He will receive injection today and repeated in 6 months.  Given the duration of his androgen depravation it is unlikely for his testosterone to recover in 3 months.  5. Follow up: He in 6 months for repeat follow-up.  30 minutes were spent on this visit.  The time was dedicated to reviewing his disease status, treatment choices and complications  related to therapy.   Zola Button, MD 6/29/20229:06 AM

## 2020-12-10 NOTE — Patient Instructions (Signed)
Leuprolide depot injection What is this medication? LEUPROLIDE (loo PROE lide) is a man-made protein that acts like a natural hormone in the body. It decreases testosterone in men and decreases estrogen in women. In men, this medicine is used to treat advanced prostate cancer. In women, some forms of this medicine may be used to treat endometriosis, uterinefibroids, or other male hormone-related problems. This medicine may be used for other purposes; ask your health care provider orpharmacist if you have questions. COMMON BRAND NAME(S): Eligard, Fensolv, Lupron Depot, Lupron Depot-Ped, Viadur What should I tell my care team before I take this medication? They need to know if you have any of these conditions: diabetes heart disease or previous heart attack high blood pressure high cholesterol mental illness osteoporosis pain or difficulty passing urine seizures spinal cord metastasis stroke suicidal thoughts, plans, or attempt; a previous suicide attempt by you or a family member tobacco smoker unusual vaginal bleeding (women) an unusual or allergic reaction to leuprolide, benzyl alcohol, other medicines, foods, dyes, or preservatives pregnant or trying to get pregnant breast-feeding How should I use this medication? This medicine is for injection into a muscle or for injection under the skin. It is given by a health care professional in a hospital or clinic setting. The specific product will determine how it will be given to you. Make sure youunderstand which product you receive and how often you will receive it. Talk to your pediatrician regarding the use of this medicine in children.Special care may be needed. Overdosage: If you think you have taken too much of this medicine contact apoison control center or emergency room at once. NOTE: This medicine is only for you. Do not share this medicine with others. What if I miss a dose? It is important not to miss a dose. Call your doctor  or health careprofessional if you are unable to keep an appointment. Depot injections: Depot injections are given either once-monthly, every 12 weeks, every 16 weeks, or every 24 weeks depending on the product you are prescribed. The product you are prescribed will be based on if you are male orfemale, and your condition. Make sure you understand your product and dosing. What may interact with this medication? Do not take this medicine with any of the following medications: chasteberry cisapride dronedarone pimozide thioridazine This medicine may also interact with the following medications: herbal or dietary supplements, like black cohosh or DHEA male hormones, like estrogens or progestins and birth control pills, patches, rings, or injections male hormones, like testosterone other medicines that prolong the QT interval (abnormal heart rhythm) This list may not describe all possible interactions. Give your health care provider a list of all the medicines, herbs, non-prescription drugs, or dietary supplements you use. Also tell them if you smoke, drink alcohol, or use illegaldrugs. Some items may interact with your medicine. What should I watch for while using this medication? Visit your doctor or health care professional for regular checks on your progress. During the first weeks of treatment, your symptoms may get worse, but then will improve as you continue your treatment. You may get hot flashes, increased bone pain, increased difficulty passing urine, or an aggravation of nerve symptoms. Discuss these effects with your doctor or health careprofessional, some of them may improve with continued use of this medicine. Male patients may experience a menstrual cycle or spotting during the first months of therapy with this medicine. If this continues, contact your doctor orhealth care professional. This medicine may increase blood sugar. Ask  your healthcare provider if changesin diet or medicines  are needed if you have diabetes. What side effects may I notice from receiving this medication? Side effects that you should report to your doctor or health care professionalas soon as possible: allergic reactions like skin rash, itching or hives, swelling of the face, lips, or tongue breathing problems chest pain depression or memory disorders pain in your legs or groin pain at site where injected or implanted seizures severe headache signs and symptoms of high blood sugar such as being more thirsty or hungry or having to urinate more than normal. You may also feel very tired or have blurry vision swelling of the feet and legs suicidal thoughts or other mood changes visual changes vomiting Side effects that usually do not require medical attention (report to yourdoctor or health care professional if they continue or are bothersome): breast swelling or tenderness decrease in sex drive or performance diarrhea hot flashes loss of appetite muscle, joint, or bone pains nausea redness or irritation at site where injected or implanted skin problems or acne This list may not describe all possible side effects. Call your doctor for medical advice about side effects. You may report side effects to FDA at1-800-FDA-1088. Where should I keep my medication? This drug is given in a hospital or clinic and will not be stored at home. NOTE: This sheet is a summary. It may not cover all possible information. If you have questions about this medicine, talk to your doctor, pharmacist, orhealth care provider.  2022 Elsevier/Gold Standard (2019-05-02 10:35:13)

## 2020-12-11 ENCOUNTER — Other Ambulatory Visit: Payer: Self-pay

## 2020-12-11 LAB — PROSTATE-SPECIFIC AG, SERUM (LABCORP): Prostate Specific Ag, Serum: 0.5 ng/mL (ref 0.0–4.0)

## 2020-12-16 ENCOUNTER — Other Ambulatory Visit: Payer: Self-pay | Admitting: Cardiology

## 2020-12-24 ENCOUNTER — Ambulatory Visit (INDEPENDENT_AMBULATORY_CARE_PROVIDER_SITE_OTHER): Payer: Medicare Other | Admitting: Cardiology

## 2020-12-24 ENCOUNTER — Encounter: Payer: Self-pay | Admitting: Cardiology

## 2020-12-24 VITALS — BP 124/66 | HR 65 | Ht 70.0 in | Wt 147.0 lb

## 2020-12-24 DIAGNOSIS — Z950 Presence of cardiac pacemaker: Secondary | ICD-10-CM

## 2020-12-24 DIAGNOSIS — I25119 Atherosclerotic heart disease of native coronary artery with unspecified angina pectoris: Secondary | ICD-10-CM

## 2020-12-24 DIAGNOSIS — I480 Paroxysmal atrial fibrillation: Secondary | ICD-10-CM

## 2020-12-24 NOTE — Progress Notes (Signed)
Cardiology Office Note  Date: 12/24/2020   ID: Russell Flattery Sr., DOB [birth year], MRN [id number]  PCP:  [facility]  Cardiologist:  Rozann Lesches, MD Electrophysiologist:  Thompson Grayer, MD   Chief Complaint  Patient presents with   Cardiac follow-up     History of Present Illness: Russell Kittelson. is a 80 y.o. male last seen in December 2021.  He has had interval follow-up with the EP, seen recently by Dr. Quentin Ore in June.  Due to malfunction of his previously placed pacemaker, he underwent revision with implantation of a new Medtronic device in March of this year.  Per device interrogation he has had brief episodes of atrial fibrillation detected and was seen in the atrial fibrillation clinic with recommendation to initiate Eliquis.  He did not start the medication concerned about the high cost and following further discussion with Dr. Quentin Ore and in the setting of a very low rhythm burden, plan is to hold off anticoagulation at this point.  His device interrogation in May showed only 0.2% atrial fibrillation burden.  He presents today stating that he feels better since his pacer implantation in March.  No sense of palpitations or chest pain.  I reviewed his medications which are noted below.  He remains on dual antiplatelet therapy, no reported bleeding problems.  No recent nitroglycerin use.  Past Medical History:  Diagnosis Date   Anemia    CAD (coronary artery disease), native coronary artery    a. DES to proximal, mid, and distal RCA 2006. b. 12/2014 - [place] s/p  PTCA to the mid-distal previously placed RCA stents, otherwise nonobstructive CAD for continued medical therapy.   CHF (congestive heart failure) (HCC)    History of TIAs    Mixed hyperlipidemia    Pacemaker lead failure    RA lead insulation defect noted at Generator change by Dr Caryl Comes 2008, impedance now low, but lead functioning otherwise normally   PAF (paroxysmal atrial fibrillation) [facility])     Prostate cancer metastatic to bone [facility])    Sinus node dysfunction [facility])    Medtronic pacemaker - Dr. Rayann Heman    Past Surgical History:  Procedure Laterality Date   CARDIAC CATHETERIZATION N/A 01/06/2015   Procedure: Left Heart Cath and Coronary Angiography;  Surgeon: Troy Sine, MD;  Location: [place] [facility];  Service: Cardiovascular;  Laterality: N/A;   LEAD REVISION/REPAIR N/A 08/25/2020   Procedure: LEAD REVISION/REPAIR;  Surgeon: Vickie Epley, MD;  Location: [place] [facility];  Service: Cardiovascular;  Laterality: N/A;   PACEMAKER IMPLANT N/A 08/25/2020   Procedure: PACEMAKER IMPLANT;  Surgeon: Vickie Epley, MD;  Location: [place] [facility];  Service: Cardiovascular;  Laterality: N/A;   PACEMAKER INSERTION     MDT   PPM GENERATOR CHANGEOUT N/A 04/11/2020   Procedure: PPM GENERATOR CHANGEOUT;  Surgeon: Deboraha Sprang, MD;  Location: [place] [facility];  Service: Cardiovascular;  Laterality: N/A;    Current Outpatient Medications  Medication Sig Dispense Refill   aspirin EC 81 MG EC tablet Take 1 tablet (81 mg total) by mouth daily. 30 tablet 11   Calcium Carbonate-Vitamin D (CALCIUM-VITAMIN D) 500-200 MG-UNIT per tablet Take 1 tablet by mouth daily.     clonazePAM (KLONOPIN) 1 MG tablet Take 2 tablets (2 mg total) by mouth at bedtime.     clopidogrel (PLAVIX) 75 MG tablet TAKE ONE TABLET BY MOUTH DAILY 90 tablet 1   guaiFENesin (MUCINEX) 600 MG 12 hr  tablet Take 600 mg by mouth 2 (two) times daily as needed for cough or to loosen phlegm.     isosorbide mononitrate (IMDUR) 30 MG 24 hr tablet TAKE ONE TABLET BY MOUTH DAILY 90 tablet 2   leuprolide (LUPRON) 22.5 MG injection Inject 22.5 mg into the muscle every 3 (three) months.     levothyroxine (SYNTHROID, LEVOTHROID) 50 MCG tablet Take 50 mcg by mouth daily before breakfast.      Melatonin 10 MG TABS Take 10 mg by mouth at bedtime.     mineral oil liquid Take 45 mLs by mouth See admin instructions. Every three  days for constipation     nitroGLYCERIN (NITROSTAT) 0.4 MG SL tablet Place 1 tablet (0.4 mg total) under the tongue every 5 (five) minutes as needed for chest pain. 25 tablet 3   pantoprazole (PROTONIX) 40 MG tablet Take 40 mg by mouth 2 (two) times daily before a meal.     simvastatin (ZOCOR) 40 MG tablet Take 40 mg by mouth at bedtime.     venlafaxine XR (EFFEXOR-XR) 75 MG 24 hr capsule Take 75 mg by mouth daily. Pt states taking one half tablet 75 mg by mouth daily.     vitamin B-12 (CYANOCOBALAMIN) 1000 MCG tablet Take 1,000 mcg by mouth daily.     No current facility-administered medications for this visit.   Allergies:  Penicillins, Phenazopyridine hcl, and Ramipril   ROS: No falls or syncope.  Physical Exam: VS:  BP 124/66   Pulse 65   Ht 5\' 10"  (1.778 m)   Wt 147 lb (66.7 kg)   SpO2 97%   BMI 21.09 kg/m , BMI Body mass index is 21.09 kg/m.  Wt Readings from Last 3 Encounters:  12/24/20 147 lb (66.7 kg)  12/10/20 150 lb 3.2 oz (68.1 kg)  11/28/20 149 lb (67.6 kg)    General: Patient appears comfortable at rest. HEENT: Conjunctiva and lids normal, wearing a mask. Lungs: Clear to auscultation, nonlabored breathing at rest. Cardiac: Regular rate and rhythm, no S3 or significant systolic murmur, no pericardial rub. Extremities: No pitting edema.  ECG:  An ECG dated 11/28/2020 was personally reviewed today and demonstrated:  Sinus rhythm.  Recent Labwork: 12/10/2020: ALT 9; AST 18; BUN 9; Creatinine 1.29; Hemoglobin 11.1; Platelet Count 159; Potassium 4.8; Sodium 140   Other Studies Reviewed Today:  Lexiscan Myoview 12/05/2019: There was no ST segment deviation noted during stress. The study is normal. There are no perfusion defects consistent with prior infarct or current ischemia. This is a low risk study. The left ventricular ejection fraction is low normal (52%).  Echocardiogram 07/30/2020:  1. Left ventricular ejection fraction, by estimation, is 45 to 50%. The   left ventricle has low normal function. Left ventricular endocardial  border not optimally defined to evaluate regional wall motion. Left  ventricular diastolic parameters are  indeterminate. The average left ventricular global longitudinal strain is  -16.7 %.   2. Right ventricular systolic function is normal. The right ventricular  size is normal.   3. The mitral valve is normal in structure. Mild mitral valve  regurgitation. No evidence of mitral stenosis.   4. The aortic valve was not well visualized. There is mild calcification  of the aortic valve. There is mild thickening of the aortic valve. Aortic  valve regurgitation is mild to moderate. No aortic stenosis is present.   5. The inferior vena cava is normal in size with greater than 50%  respiratory variability, suggesting right  atrial pressure of 3 mmHg.   Assessment and Plan:  1.  CAD status post DES to the proximal, mid, and distal RCA in 2006 with angioplasty of the mid and distal stent sites in 2016.  We will continue with observation on medical therapy in the absence of angina symptoms.  Follow-up Myoview last year was low risk.  Continue aspirin, Plavix, Imdur, Zocor, and as needed nitroglycerin.  2.  Sinus node dysfunction status post Medtronic pacemaker implantation in March as discussed above.  3.  Paroxysmal atrial fibrillation documented by device interrogation, very low rhythm burden less than 1% and asymptomatic in terms of palpitations.  CHA2DS2-VASc score is 5.  At this point I agree with Dr. Quentin Ore and would hold off on anticoagulation unless rhythm burden increases significantly.  Medication Adjustments/Labs and Tests Ordered: Current medicines are reviewed at length with the patient today.  Concerns regarding medicines are outlined above.   Tests Ordered: No orders of the defined types were placed in this encounter.   Medication Changes: No orders of the defined types were placed in this  encounter.   Disposition:  Follow up  6 months.  Signed, Satira Sark, MD, [facility] 12/24/2020 10:17 AM    [facility] [place], [place], [place] [address] Phone: [phone]; Fax: [phone]

## 2020-12-24 NOTE — Patient Instructions (Addendum)

## 2021-01-12 ENCOUNTER — Ambulatory Visit (INDEPENDENT_AMBULATORY_CARE_PROVIDER_SITE_OTHER): Payer: Medicare Other

## 2021-01-12 DIAGNOSIS — I480 Paroxysmal atrial fibrillation: Secondary | ICD-10-CM

## 2021-01-13 LAB — CUP PACEART REMOTE DEVICE CHECK
Battery Remaining Longevity: 160 mo
Battery Voltage: 3.07 V
Brady Statistic AP VP Percent: 0.03 %
Brady Statistic AP VS Percent: 68.34 %
Brady Statistic AS VP Percent: 0 %
Brady Statistic AS VS Percent: 31.62 %
Brady Statistic RA Percent Paced: 68.41 %
Brady Statistic RV Percent Paced: 0.03 %
Date Time Interrogation Session: 20220731225739
Implantable Lead Implant Date: 20220314
Implantable Lead Implant Date: 20220314
Implantable Lead Location: 753859
Implantable Lead Location: 753860
Implantable Lead Model: 5076
Implantable Lead Model: 5076
Implantable Pulse Generator Implant Date: 20211029
Lead Channel Impedance Value: 361 Ohm
Lead Channel Impedance Value: 361 Ohm
Lead Channel Impedance Value: 399 Ohm
Lead Channel Impedance Value: 437 Ohm
Lead Channel Pacing Threshold Amplitude: 0.375 V
Lead Channel Pacing Threshold Amplitude: 0.75 V
Lead Channel Pacing Threshold Pulse Width: 0.4 ms
Lead Channel Pacing Threshold Pulse Width: 0.4 ms
Lead Channel Sensing Intrinsic Amplitude: 2.875 mV
Lead Channel Sensing Intrinsic Amplitude: 2.875 mV
Lead Channel Sensing Intrinsic Amplitude: 7.125 mV
Lead Channel Sensing Intrinsic Amplitude: 7.125 mV
Lead Channel Setting Pacing Amplitude: 1.5 V
Lead Channel Setting Pacing Amplitude: 2 V
Lead Channel Setting Pacing Pulse Width: 0.4 ms
Lead Channel Setting Sensing Sensitivity: 1.2 mV

## 2021-02-04 NOTE — Progress Notes (Signed)
Remote pacemaker transmission.   

## 2021-04-13 ENCOUNTER — Ambulatory Visit (INDEPENDENT_AMBULATORY_CARE_PROVIDER_SITE_OTHER): Payer: Medicare Other

## 2021-04-13 DIAGNOSIS — I480 Paroxysmal atrial fibrillation: Secondary | ICD-10-CM | POA: Diagnosis not present

## 2021-04-14 LAB — CUP PACEART REMOTE DEVICE CHECK
Battery Remaining Longevity: 157 mo
Battery Voltage: 3.04 V
Brady Statistic AP VP Percent: 0.03 %
Brady Statistic AP VS Percent: 71.14 %
Brady Statistic AS VP Percent: 0 %
Brady Statistic AS VS Percent: 28.82 %
Brady Statistic RA Percent Paced: 71.23 %
Brady Statistic RV Percent Paced: 0.03 %
Date Time Interrogation Session: 20221031055835
Implantable Lead Implant Date: 20220314
Implantable Lead Implant Date: 20220314
Implantable Lead Location: 753859
Implantable Lead Location: 753860
Implantable Lead Model: 5076
Implantable Lead Model: 5076
Implantable Pulse Generator Implant Date: 20211029
Lead Channel Impedance Value: 361 Ohm
Lead Channel Impedance Value: 361 Ohm
Lead Channel Impedance Value: 399 Ohm
Lead Channel Impedance Value: 418 Ohm
Lead Channel Pacing Threshold Amplitude: 0.5 V
Lead Channel Pacing Threshold Amplitude: 0.75 V
Lead Channel Pacing Threshold Pulse Width: 0.4 ms
Lead Channel Pacing Threshold Pulse Width: 0.4 ms
Lead Channel Sensing Intrinsic Amplitude: 2.75 mV
Lead Channel Sensing Intrinsic Amplitude: 2.75 mV
Lead Channel Sensing Intrinsic Amplitude: 6.25 mV
Lead Channel Sensing Intrinsic Amplitude: 6.25 mV
Lead Channel Setting Pacing Amplitude: 1.5 V
Lead Channel Setting Pacing Amplitude: 2 V
Lead Channel Setting Pacing Pulse Width: 0.4 ms
Lead Channel Setting Sensing Sensitivity: 1.2 mV

## 2021-04-20 NOTE — Progress Notes (Signed)
Remote pacemaker transmission.   

## 2021-05-19 ENCOUNTER — Telehealth: Payer: Self-pay | Admitting: Oncology

## 2021-05-19 NOTE — Telephone Encounter (Signed)
Scheduled per los, patient has been called and notified of upcoming December appointments.

## 2021-06-11 ENCOUNTER — Inpatient Hospital Stay (HOSPITAL_BASED_OUTPATIENT_CLINIC_OR_DEPARTMENT_OTHER): Payer: Medicare Other | Admitting: Oncology

## 2021-06-11 ENCOUNTER — Ambulatory Visit: Payer: Medicare Other

## 2021-06-11 ENCOUNTER — Inpatient Hospital Stay: Payer: Medicare Other | Attending: Oncology

## 2021-06-11 ENCOUNTER — Other Ambulatory Visit: Payer: Self-pay

## 2021-06-11 VITALS — BP 140/70 | HR 81 | Temp 97.7°F | Resp 18 | Wt 145.7 lb

## 2021-06-11 DIAGNOSIS — C61 Malignant neoplasm of prostate: Secondary | ICD-10-CM | POA: Diagnosis present

## 2021-06-11 DIAGNOSIS — Z5111 Encounter for antineoplastic chemotherapy: Secondary | ICD-10-CM | POA: Insufficient documentation

## 2021-06-11 DIAGNOSIS — C7951 Secondary malignant neoplasm of bone: Secondary | ICD-10-CM | POA: Insufficient documentation

## 2021-06-11 LAB — CMP (CANCER CENTER ONLY)
ALT: 10 U/L (ref 0–44)
AST: 15 U/L (ref 15–41)
Albumin: 4.4 g/dL (ref 3.5–5.0)
Alkaline Phosphatase: 26 U/L — ABNORMAL LOW (ref 38–126)
Anion gap: 6 (ref 5–15)
BUN: 16 mg/dL (ref 8–23)
CO2: 29 mmol/L (ref 22–32)
Calcium: 9.6 mg/dL (ref 8.9–10.3)
Chloride: 104 mmol/L (ref 98–111)
Creatinine: 1.23 mg/dL (ref 0.61–1.24)
GFR, Estimated: 60 mL/min — ABNORMAL LOW (ref >60)
Glucose, Bld: 78 mg/dL (ref 70–99)
Potassium: 4.7 mmol/L (ref 3.5–5.1)
Sodium: 139 mmol/L (ref 135–145)
Total Bilirubin: 0.4 mg/dL (ref 0.3–1.2)
Total Protein: 6.9 g/dL (ref 6.5–8.1)

## 2021-06-11 LAB — CBC WITH DIFFERENTIAL (CANCER CENTER ONLY)
Abs Immature Granulocytes: 0.02 10*3/uL (ref 0.00–0.07)
Basophils Absolute: 0 10*3/uL (ref 0.0–0.1)
Basophils Relative: 1 %
Eosinophils Absolute: 0.2 10*3/uL (ref 0.0–0.5)
Eosinophils Relative: 3 %
HCT: 35 % — ABNORMAL LOW (ref 39.0–52.0)
Hemoglobin: 11.5 g/dL — ABNORMAL LOW (ref 13.0–17.0)
Immature Granulocytes: 0 %
Lymphocytes Relative: 27 %
Lymphs Abs: 1.6 10*3/uL (ref 0.7–4.0)
MCH: 29 pg (ref 26.0–34.0)
MCHC: 32.9 g/dL (ref 30.0–36.0)
MCV: 88.2 fL (ref 80.0–100.0)
Monocytes Absolute: 0.5 10*3/uL (ref 0.1–1.0)
Monocytes Relative: 8 %
Neutro Abs: 3.5 10*3/uL (ref 1.7–7.7)
Neutrophils Relative %: 61 %
Platelet Count: 178 10*3/uL (ref 150–400)
RBC: 3.97 MIL/uL — ABNORMAL LOW (ref 4.22–5.81)
RDW: 14.5 % (ref 11.5–15.5)
WBC Count: 5.8 10*3/uL (ref 4.0–10.5)
nRBC: 0 % (ref 0.0–0.2)

## 2021-06-11 MED ORDER — LEUPROLIDE ACETATE (3 MONTH) 22.5 MG ~~LOC~~ KIT
22.5000 mg | PACK | Freq: Once | SUBCUTANEOUS | Status: AC
  Administered 2021-06-11: 10:00:00 22.5 mg via SUBCUTANEOUS

## 2021-06-11 NOTE — Progress Notes (Signed)
Hematology and Oncology Follow Up Visit  Russell Browning [id number] May 12, 1941 80 y.o. 06/11/2021 9:01 AM    Principle Diagnosis:  80 year old man with castration-sensitive advanced prostate cancer with disease to the bone diagnosed in 2013.  He was found to have localized disease in 2000.   Prior Therapy: 1. He is status post radiation therapy and 2 years of Lupron for locally advanced prostate cancer at the time of diagnosis in 2000.  2.  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  He was started on Lupron at that time.  3. He is S/P radiation therapy directed to the right hip (30 gray in 12 fractions) concluded in 07/2011 for palliative purposes.   4. Xgeva given every 3 months started in 10/2011.  Therapy discontinued due to based on patient's preference.  Current therapy:   Eligard every 3 months.  He will receive his next injection today.   Interim History:  Russell Browning returns today for repeat evaluation.  Since the last visit, he reports no major changes in his health.  He denies any bone pain or pathological fractures.  He denies any recent hospitalizations or illnesses.  His performance status quality of life remains unchanged.  He continues to have a good performance status without any symptoms related to his cancer.      Medications: Updated on review. Current Outpatient Medications  Medication Sig Dispense Refill   aspirin EC 81 MG EC tablet Take 1 tablet (81 mg total) by mouth daily. 30 tablet 11   Calcium Carbonate-Vitamin D (CALCIUM-VITAMIN D) 500-200 MG-UNIT per tablet Take 1 tablet by mouth daily.     clonazePAM (KLONOPIN) 1 MG tablet Take 2 tablets (2 mg total) by mouth at bedtime.     clopidogrel (PLAVIX) 75 MG tablet TAKE ONE TABLET BY MOUTH DAILY 90 tablet 1   guaiFENesin (MUCINEX) 600 MG 12 hr tablet Take 600 mg by mouth 2 (two) times daily as needed for cough or to loosen phlegm.     isosorbide  mononitrate (IMDUR) 30 MG 24 hr tablet TAKE ONE TABLET BY MOUTH DAILY 90 tablet 2   leuprolide (LUPRON) 22.5 MG injection Inject 22.5 mg into the muscle every 3 (three) months.     levothyroxine (SYNTHROID, LEVOTHROID) 50 MCG tablet Take 50 mcg by mouth daily before breakfast.      Melatonin 10 MG TABS Take 10 mg by mouth at bedtime.     mineral oil liquid Take 45 mLs by mouth See admin instructions. Every three days for constipation     nitroGLYCERIN (NITROSTAT) 0.4 MG SL tablet Place 1 tablet (0.4 mg total) under the tongue every 5 (five) minutes as needed for chest pain. 25 tablet 3   pantoprazole (PROTONIX) 40 MG tablet Take 40 mg by mouth 2 (two) times daily before a meal.     simvastatin (ZOCOR) 40 MG tablet Take 40 mg by mouth at bedtime.     venlafaxine XR (EFFEXOR-XR) 75 MG 24 hr capsule Take 75 mg by mouth daily. Pt states taking one half tablet 75 mg by mouth daily.     vitamin B-12 (CYANOCOBALAMIN) 1000 MCG tablet Take 1,000 mcg by mouth daily.     No current facility-administered medications for this visit.     Allergies:  Allergies  Allergen Reactions   Penicillins Other (See Comments)    Passed out   Phenazopyridine Hcl Hives   Ramipril Hives  Physical Exam:      Blood pressure 140/70, pulse 81, temperature 97.7 F (36.5 C), temperature source Temporal, resp. rate 18, weight 145 lb 11.2 oz (66.1 kg), SpO2 98 %.      ECOG: 1    General appearance: Alert, awake without any distress. Head: Atraumatic without abnormalities Oropharynx: Without any thrush or ulcers. Eyes: No scleral icterus. Lymph nodes: No lymphadenopathy noted in the cervical, supraclavicular, or axillary nodes Heart:regular rate and rhythm, without any murmurs or gallops.   Lung: Clear to auscultation without any rhonchi, wheezes or dullness to percussion. Abdomin: Soft, nontender without any shifting dullness or ascites. Musculoskeletal: No clubbing or cyanosis. Neurological: No  motor or sensory deficits. Skin: No rashes or lesions.              Lab Results: Lab Results  Component Value Date   WBC 4.9 12/10/2020   HGB 11.1 (L) 12/10/2020   HCT 33.4 (L) 12/10/2020   MCV 87.9 12/10/2020   PLT 159 12/10/2020        Latest Reference Range & Units 02/21/20 09:21 05/21/20 09:25 09/08/20 09:50 12/10/20 08:53  Prostate Specific Ag, Serum 0.0 - 4.0 ng/mL 0.3 0.3 0.4 0.5               Impression and Plan:   80 year old man with:   1.  Castration-sensitive advanced prostate cancer with disease to the bone diagnosed in 2013.    His PSA is showing a slow increase but has been overall under reasonable control.  Therapy escalation utilizing androgen receptor pathway inhibitors or systemic chemotherapy were discussed.  Given that he has slow rise in his PSA and overall of the comorbidities we will continue to monitor at this time.  If his PSA starts to rise rapidly we will consider additional therapy.   2. Bone pain: Related to prostate cancer that has not been an issue at this time.  3. Bone directed therapy: He has declined any additional Xgeva injections.  I recommended calcium and vitamin D supplements.    4. Androgen depravation: Risks and benefits of continuing androgen deprivation therapy were discussed.  Complications including osteoporosis, hot flashes among others were reviewed.  He is agreeable to proceed and continues to prefer every 6 months visit.  He understands the risk of developing rise in his PSA accordingly.  5. Follow up: Will be in 6 months for repeat evaluation.  30 minutes were dedicated to this encounter.  Time spent on reviewing laboratory data, disease status update, treatment choices and outlining future plan of care.   Zola Button, MD 12/29/20229:01 AM

## 2021-06-12 ENCOUNTER — Telehealth: Payer: Self-pay | Admitting: *Deleted

## 2021-06-12 LAB — PROSTATE-SPECIFIC AG, SERUM (LABCORP): Prostate Specific Ag, Serum: 0.6 ng/mL (ref 0.0–4.0)

## 2021-06-12 NOTE — Telephone Encounter (Signed)
-----   Message from Wyatt Portela, MD sent at 06/12/2021  9:10 AM EST ----- Please let him know his PSA has little change. Continues to be very low

## 2021-06-12 NOTE — Telephone Encounter (Signed)
PC to patient, informed him of Dr. Hazeline Junker message below.  He verbalizes understanding.

## 2021-06-23 ENCOUNTER — Other Ambulatory Visit: Payer: Self-pay | Admitting: Cardiology

## 2021-07-13 ENCOUNTER — Ambulatory Visit (INDEPENDENT_AMBULATORY_CARE_PROVIDER_SITE_OTHER): Payer: Medicare Other

## 2021-07-13 DIAGNOSIS — I428 Other cardiomyopathies: Secondary | ICD-10-CM

## 2021-07-13 LAB — CUP PACEART REMOTE DEVICE CHECK
Battery Remaining Longevity: 156 mo
Battery Voltage: 3.03 V
Brady Statistic AP VP Percent: 0.03 %
Brady Statistic AP VS Percent: 71.86 %
Brady Statistic AS VP Percent: 0 %
Brady Statistic AS VS Percent: 28.1 %
Brady Statistic RA Percent Paced: 71.96 %
Brady Statistic RV Percent Paced: 0.03 %
Date Time Interrogation Session: 20230130055027
Implantable Lead Implant Date: 20220314
Implantable Lead Implant Date: 20220314
Implantable Lead Location: 753859
Implantable Lead Location: 753860
Implantable Lead Model: 5076
Implantable Lead Model: 5076
Implantable Pulse Generator Implant Date: 20211029
Lead Channel Impedance Value: 380 Ohm
Lead Channel Impedance Value: 380 Ohm
Lead Channel Impedance Value: 437 Ohm
Lead Channel Impedance Value: 456 Ohm
Lead Channel Pacing Threshold Amplitude: 0.5 V
Lead Channel Pacing Threshold Amplitude: 0.75 V
Lead Channel Pacing Threshold Pulse Width: 0.4 ms
Lead Channel Pacing Threshold Pulse Width: 0.4 ms
Lead Channel Sensing Intrinsic Amplitude: 2.625 mV
Lead Channel Sensing Intrinsic Amplitude: 2.625 mV
Lead Channel Sensing Intrinsic Amplitude: 6.375 mV
Lead Channel Sensing Intrinsic Amplitude: 6.375 mV
Lead Channel Setting Pacing Amplitude: 1.5 V
Lead Channel Setting Pacing Amplitude: 2 V
Lead Channel Setting Pacing Pulse Width: 0.4 ms
Lead Channel Setting Sensing Sensitivity: 1.2 mV

## 2021-07-16 ENCOUNTER — Other Ambulatory Visit: Payer: Self-pay | Admitting: *Deleted

## 2021-07-16 MED ORDER — CLOPIDOGREL BISULFATE 75 MG PO TABS: 75.0000 mg | ORAL_TABLET | Freq: Every day | ORAL | 0 refills | Status: DC

## 2021-07-17 ENCOUNTER — Other Ambulatory Visit: Payer: Self-pay | Admitting: Cardiology

## 2021-07-21 NOTE — Progress Notes (Signed)
Remote pacemaker transmission.   

## 2021-08-20 ENCOUNTER — Other Ambulatory Visit: Payer: Self-pay | Admitting: Cardiology

## 2021-08-27 ENCOUNTER — Other Ambulatory Visit: Payer: Self-pay | Admitting: Cardiology

## 2021-09-04 ENCOUNTER — Other Ambulatory Visit: Payer: Self-pay | Admitting: Cardiology

## 2021-09-08 ENCOUNTER — Other Ambulatory Visit: Payer: Self-pay | Admitting: Cardiology

## 2021-09-11 ENCOUNTER — Other Ambulatory Visit: Payer: Self-pay | Admitting: Cardiology

## 2021-09-12 ENCOUNTER — Other Ambulatory Visit: Payer: Self-pay | Admitting: Cardiology

## 2021-10-04 IMAGING — DX DG CHEST 2V
2 series · 2 of 2 positions shown · non-contrast
Comparison: August 25, 2020.

CLINICAL DATA: Chest pain.

EXAM:
CHEST - 2 VIEW

[chest pa]
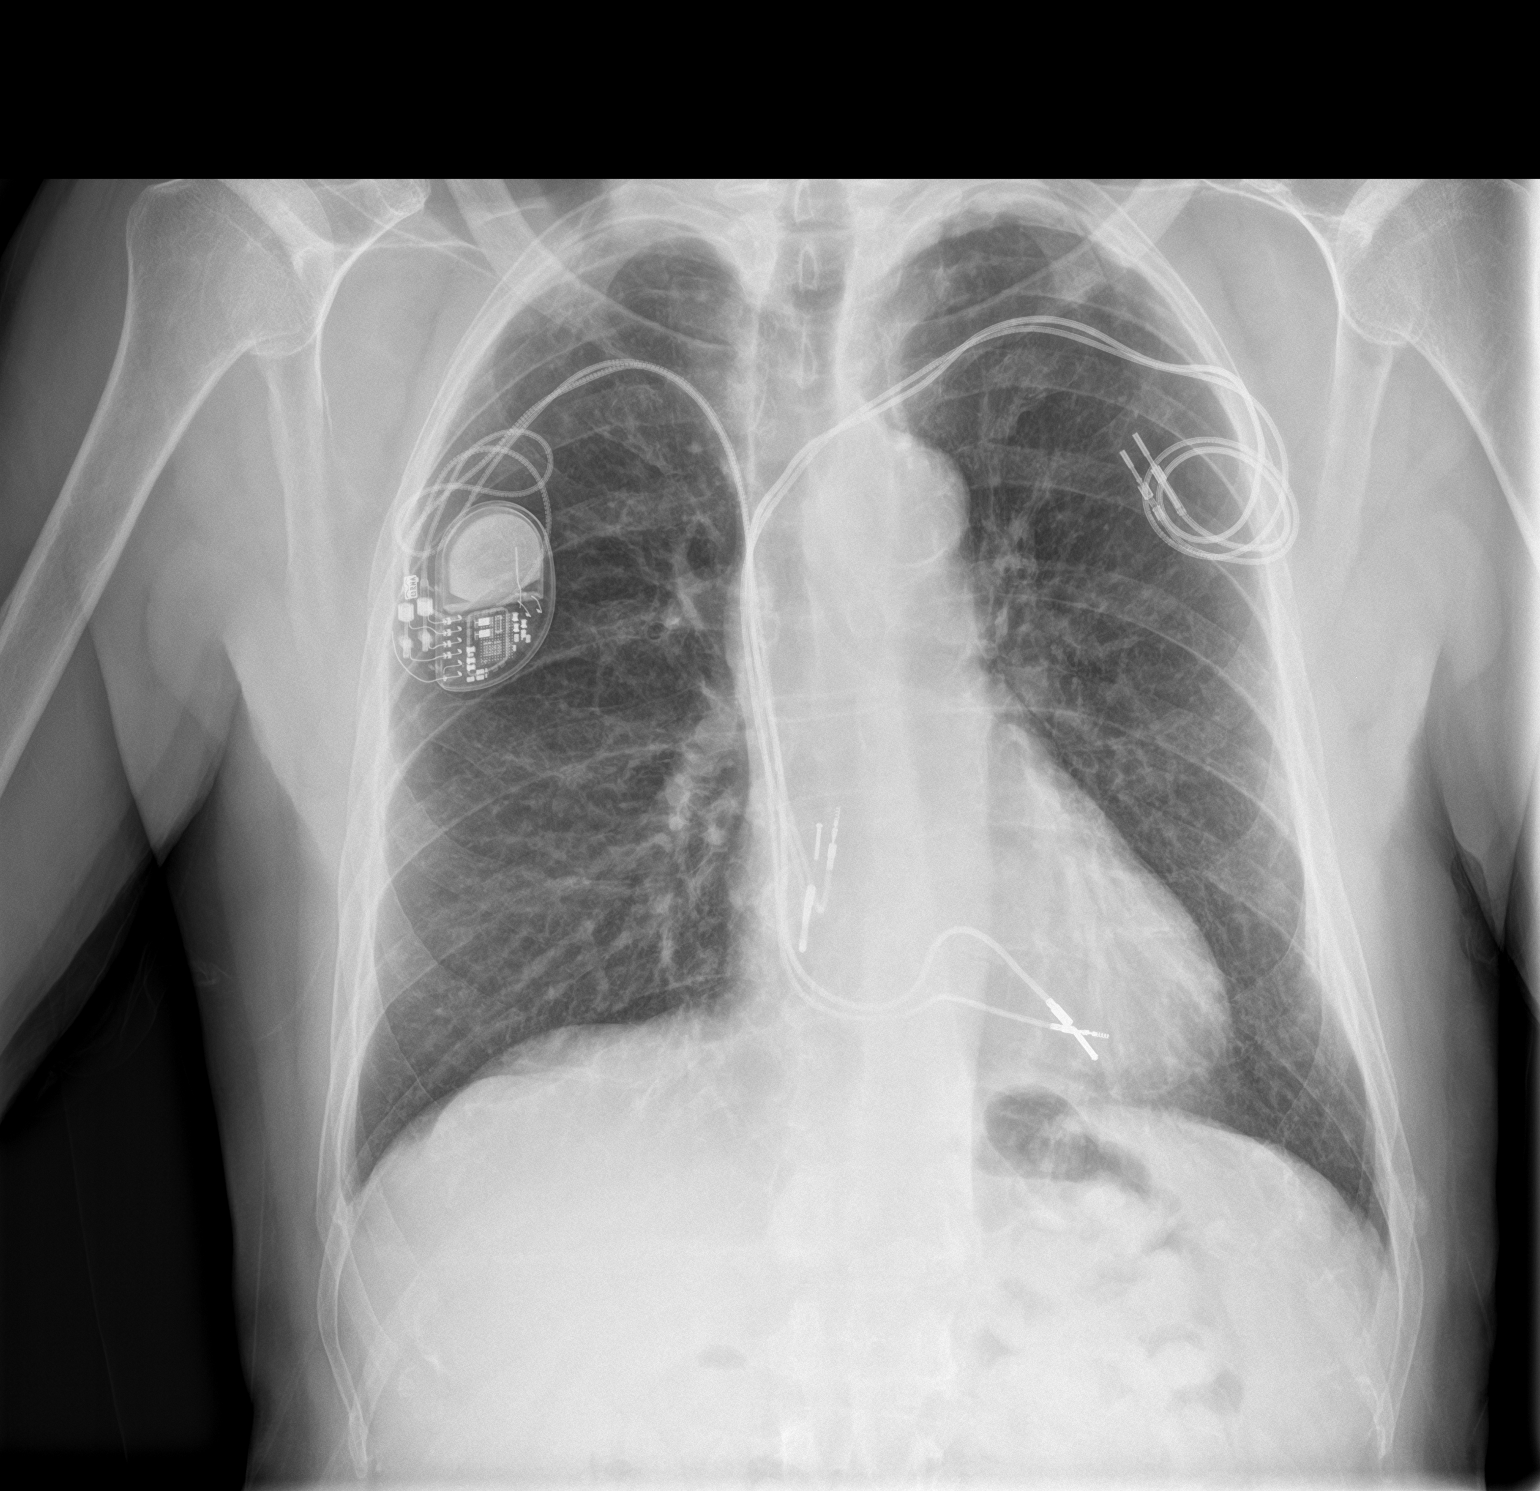

[chest lat]
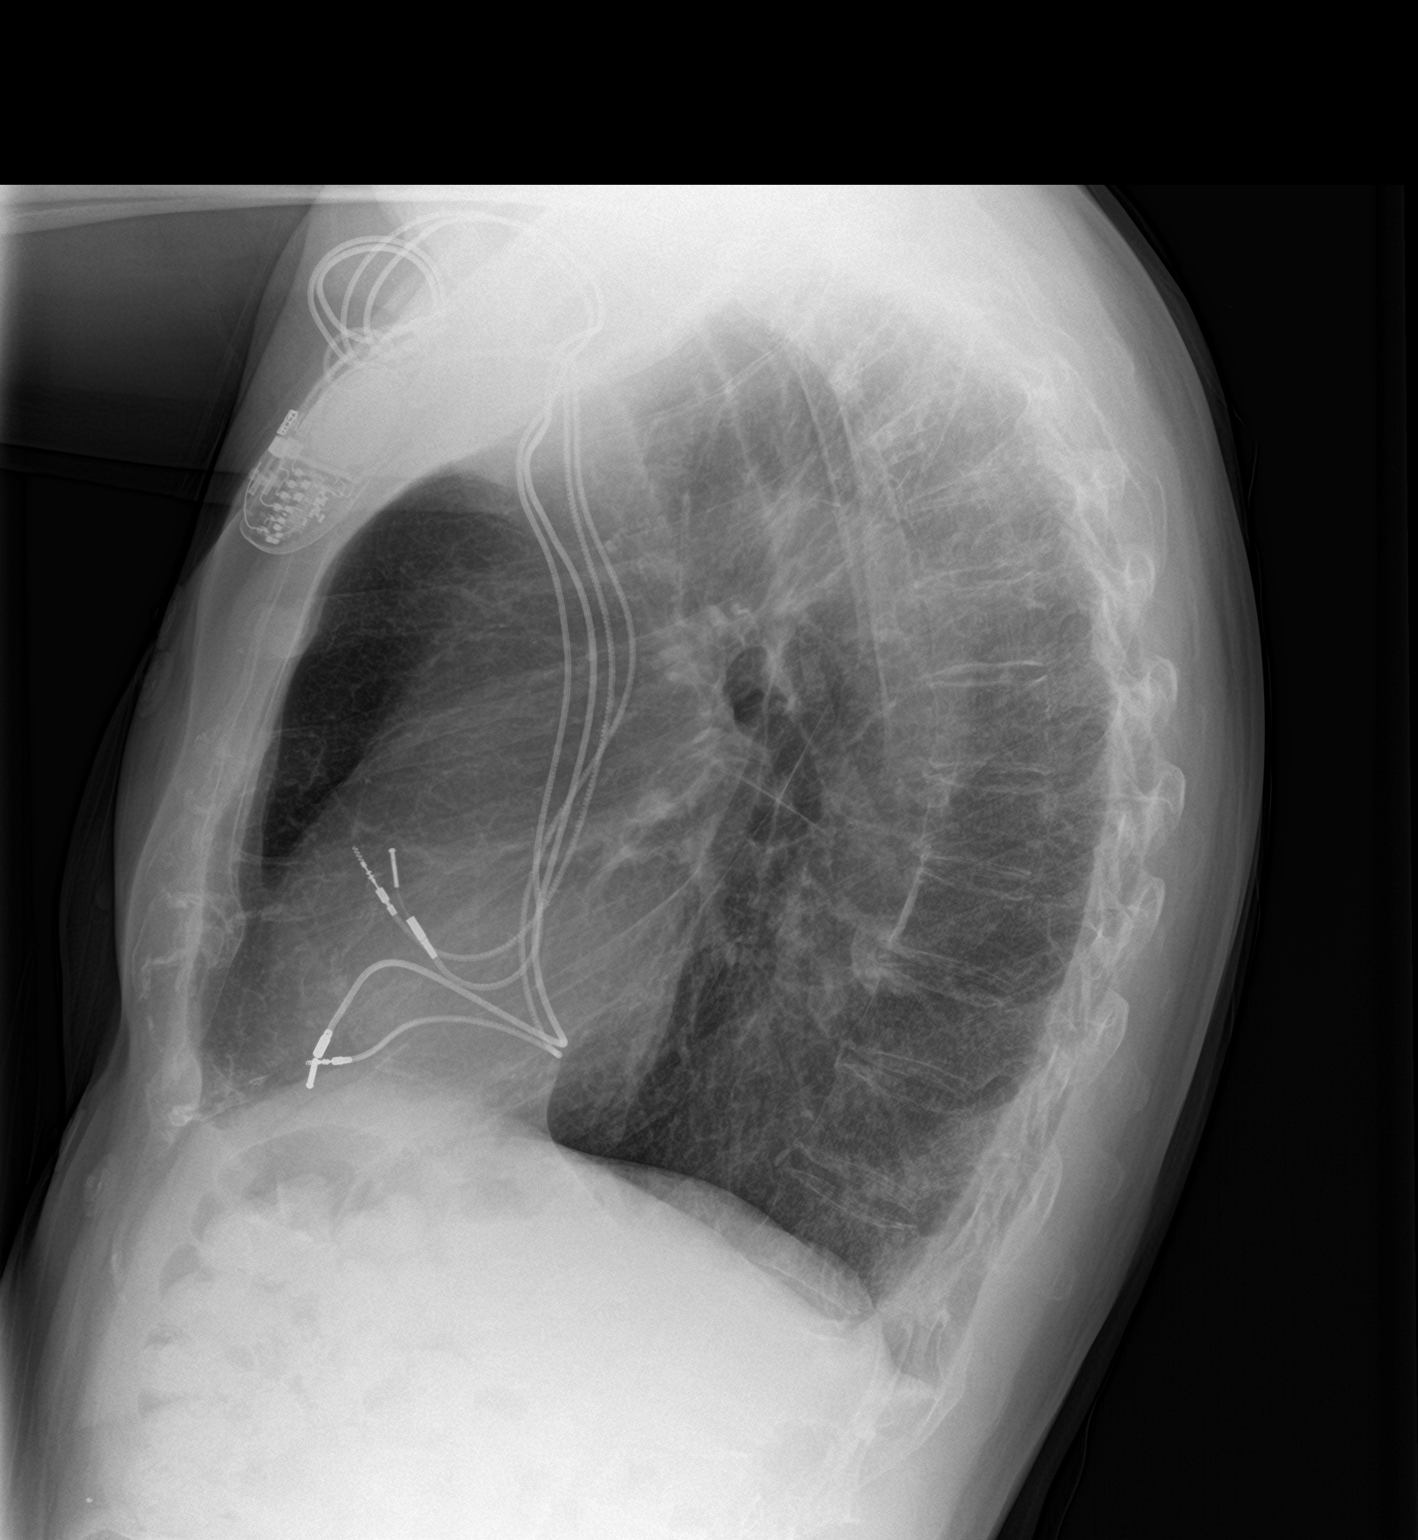

[2 of 2 positions shown; findings below may reference images not displayed]

FINDINGS: Similar cardiomediastinal silhouette, which is within normal limits.
Aortic atherosclerosis. Right subclavian approach cardiac rhythm
maintenance device. Similar retained left leads from prior cardiac
rhythm maintenance device. Both lungs are clear. No visible pleural
effusions or pneumothorax. Similar left greater than right biapical
pleuroparenchymal scarring. No acute osseous abnormality.
Osteopenia.
IMPRESSION: No evidence of acute cardiopulmonary disease.

## 2021-10-12 ENCOUNTER — Ambulatory Visit (INDEPENDENT_AMBULATORY_CARE_PROVIDER_SITE_OTHER): Payer: Medicare Other

## 2021-10-12 DIAGNOSIS — I428 Other cardiomyopathies: Secondary | ICD-10-CM

## 2021-10-12 LAB — CUP PACEART REMOTE DEVICE CHECK
Battery Remaining Longevity: 152 mo
Battery Voltage: 3.02 V
Brady Statistic AP VP Percent: 0.03 %
Brady Statistic AP VS Percent: 71.4 %
Brady Statistic AS VP Percent: 0 %
Brady Statistic AS VS Percent: 28.57 %
Brady Statistic RA Percent Paced: 71.49 %
Brady Statistic RV Percent Paced: 0.04 %
Date Time Interrogation Session: 20230430230326
Implantable Lead Implant Date: 20220314
Implantable Lead Implant Date: 20220314
Implantable Lead Location: 753859
Implantable Lead Location: 753860
Implantable Lead Model: 5076
Implantable Lead Model: 5076
Implantable Pulse Generator Implant Date: 20211029
Lead Channel Impedance Value: 342 Ohm
Lead Channel Impedance Value: 399 Ohm
Lead Channel Impedance Value: 437 Ohm
Lead Channel Impedance Value: 456 Ohm
Lead Channel Pacing Threshold Amplitude: 0.5 V
Lead Channel Pacing Threshold Amplitude: 0.75 V
Lead Channel Pacing Threshold Pulse Width: 0.4 ms
Lead Channel Pacing Threshold Pulse Width: 0.4 ms
Lead Channel Sensing Intrinsic Amplitude: 11.5 mV
Lead Channel Sensing Intrinsic Amplitude: 11.5 mV
Lead Channel Sensing Intrinsic Amplitude: 2.75 mV
Lead Channel Sensing Intrinsic Amplitude: 2.75 mV
Lead Channel Setting Pacing Amplitude: 1.5 V
Lead Channel Setting Pacing Amplitude: 2 V
Lead Channel Setting Pacing Pulse Width: 0.4 ms
Lead Channel Setting Sensing Sensitivity: 1.2 mV

## 2021-10-23 ENCOUNTER — Ambulatory Visit (INDEPENDENT_AMBULATORY_CARE_PROVIDER_SITE_OTHER): Payer: Medicare Other | Admitting: Internal Medicine

## 2021-10-23 ENCOUNTER — Encounter: Payer: Self-pay | Admitting: Internal Medicine

## 2021-10-23 VITALS — BP 128/70 | HR 61 | Ht 69.0 in | Wt 144.6 lb

## 2021-10-23 DIAGNOSIS — I495 Sick sinus syndrome: Secondary | ICD-10-CM

## 2021-10-23 DIAGNOSIS — I25119 Atherosclerotic heart disease of native coronary artery with unspecified angina pectoris: Secondary | ICD-10-CM | POA: Diagnosis not present

## 2021-10-23 NOTE — Progress Notes (Signed)
? ? ?PCP: [facility] ?  ?Primary EP:  Dr Rayann Heman ? ?Russell Browning. is a 81 y.o. male who presents today for routine electrophysiology followup.  Since last being seen in our clinic, the patient reports doing very well.  Today, he denies symptoms of palpitations, chest pain, shortness of breath,  lower extremity edema, dizziness, presyncope, or syncope.  The patient is otherwise without complaint today.  ? ?Past Medical History:  ?Diagnosis Date  ? Anemia   ? CAD (coronary artery disease), native coronary artery   ? a. DES to proximal, mid, and distal RCA 2006. b. 12/2014 - [place] s/p  PTCA to the mid-distal previously placed RCA stents, otherwise nonobstructive CAD for continued medical therapy.  ? CHF (congestive heart failure) ([place])   ? History of TIAs   ? Mixed hyperlipidemia   ? Pacemaker lead failure   ? RA lead insulation defect noted at Generator change by Dr Caryl Comes 2008, impedance now low, but lead functioning otherwise normally  ? PAF (paroxysmal atrial fibrillation) ([place])   ? Prostate cancer metastatic to bone [facility])   ? Sinus node dysfunction (HCC)   ? Medtronic pacemaker - Dr. Rayann Heman  ? ?Past Surgical History:  ?Procedure Laterality Date  ? CARDIAC CATHETERIZATION N/A 01/06/2015  ? Procedure: Left Heart Cath and Coronary Angiography;  Surgeon: Troy Sine, MD;  Location: [place] [facility];  Service: Cardiovascular;  Laterality: N/A;  ? LEAD REVISION/REPAIR N/A 08/25/2020  ? Procedure: LEAD REVISION/REPAIR;  Surgeon: Vickie Epley, MD;  Location: [place] [facility];  Service: Cardiovascular;  Laterality: N/A;  ? PACEMAKER IMPLANT N/A 08/25/2020  ? Procedure: PACEMAKER IMPLANT;  Surgeon: Vickie Epley, MD;  Location: [place] [facility];  Service: Cardiovascular;  Laterality: N/A;  ? PACEMAKER INSERTION    ? MDT  ? PPM GENERATOR CHANGEOUT N/A 04/11/2020  ? Procedure: PPM GENERATOR CHANGEOUT;  Surgeon: Deboraha Sprang, MD;  Location: [place] [facility];  Service: Cardiovascular;   Laterality: N/A;  ? ? ?ROS- all systems are reviewed and negative except as per HPI above ? ?Current Outpatient Medications  ?Medication Sig Dispense Refill  ? aspirin EC 81 MG EC tablet Take 1 tablet (81 mg total) by mouth daily. 30 tablet 11  ? Calcium Carbonate-Vitamin D (CALCIUM-VITAMIN D) 500-200 MG-UNIT per tablet Take 1 tablet by mouth daily.    ? clonazePAM (KLONOPIN) 1 MG tablet Take 2 tablets (2 mg total) by mouth at bedtime.    ? clopidogrel (PLAVIX) 75 MG tablet TAKE ONE TABLET BY MOUTH DAILY 90 tablet 2  ? guaiFENesin (MUCINEX) 600 MG 12 hr tablet Take 600 mg by mouth 2 (two) times daily as needed for cough or to loosen phlegm.    ? isosorbide mononitrate (IMDUR) 30 MG 24 hr tablet TAKE ONE TABLET BY MOUTH DAILY 90 tablet 1  ? leuprolide (LUPRON) 22.5 MG injection Inject 22.5 mg into the muscle every 3 (three) months.    ? levothyroxine (SYNTHROID, LEVOTHROID) 50 MCG tablet Take 50 mcg by mouth daily before breakfast.     ? Melatonin 10 MG TABS Take 10 mg by mouth at bedtime.    ? nitroGLYCERIN (NITROSTAT) 0.4 MG SL tablet Place 1 tablet (0.4 mg total) under the tongue every 5 (five) minutes as needed for chest pain. 25 tablet 3  ? pantoprazole (PROTONIX) 40 MG tablet Take 40 mg by mouth 2 (two) times daily before a meal.    ? simvastatin (ZOCOR) 40 MG tablet  Take 40 mg by mouth at bedtime.    ? venlafaxine XR (EFFEXOR-XR) 75 MG 24 hr capsule Take 75 mg by mouth daily. Pt states taking one half tablet 75 mg by mouth daily.    ? vitamin B-12 (CYANOCOBALAMIN) 1000 MCG tablet Take 1,000 mcg by mouth daily.    ? ?No current facility-administered medications for this visit.  ? ? ?Physical Exam: ?Vitals:  ? 10/23/21 1127  ?BP: 128/70  ?Pulse: 61  ?SpO2: 99%  ?Weight: 144 lb 9.6 oz (65.6 kg)  ?Height: '5\' 9"'$  (1.753 m)  ? ? ?GEN- The patient is well appearing, alert and oriented x 3 today.   ?Head- normocephalic, atraumatic ?Eyes-  Sclera clear, conjunctiva pink ?Ears- hearing intact ?Oropharynx- clear ?Lungs-  Clear to ausculation bilaterally, normal work of breathing ?Chest- pacemaker pocket is well healed ?Heart- Regular rate and rhythm, no murmurs, rubs or gallops, PMI not laterally displaced ?GI- soft, NT, ND, + BS ?Extremities- no clubbing, cyanosis, or edema ? ?Pacemaker interrogation- reviewed in detail today,  See PACEART report ?  ?Assessment and Plan: ? ?1. Symptomatic sinus bradycardia  complete heart block ?Normal pacemaker function ?See [facility] Art report ?No changes today ?he is not device dependant today ? ?2. CAD ?Stable ?No change required today ? ?3. HL ?Stable ?No change required today ? ?Return in 12 months ? ?Thompson Grayer MD, FACC ?10/23/2021 ?11:51 AM ? ? ?

## 2021-10-23 NOTE — Patient Instructions (Addendum)
Medication Instructions:  ?Your physician recommends that you continue on your current medications as directed. Please refer to the Current Medication list given to you today. ? ?Labwork: ?none ? ?Testing/Procedures: ?none ? ?Follow-Up: ?Your physician recommends that you schedule a follow-up appointment in: 1 year. ? ?Any Other Special Instructions Will Be Listed Below (If Applicable). ? ?If you need a refill on your cardiac medications before your next appointment, please call your pharmacy. ?

## 2021-10-26 NOTE — Progress Notes (Signed)
Remote pacemaker transmission.   

## 2021-10-28 LAB — CUP PACEART INCLINIC DEVICE CHECK
Date Time Interrogation Session: 20230512112412
Implantable Lead Implant Date: 20220314
Implantable Lead Implant Date: 20220314
Implantable Lead Location: 753859
Implantable Lead Location: 753860
Implantable Lead Model: 5076
Implantable Lead Model: 5076
Implantable Pulse Generator Implant Date: 20211029

## 2021-11-26 ENCOUNTER — Encounter: Payer: Medicare Other | Admitting: Cardiology

## 2021-12-10 ENCOUNTER — Inpatient Hospital Stay (HOSPITAL_BASED_OUTPATIENT_CLINIC_OR_DEPARTMENT_OTHER): Payer: Medicare Other | Admitting: Oncology

## 2021-12-10 ENCOUNTER — Inpatient Hospital Stay: Payer: Medicare Other

## 2021-12-10 ENCOUNTER — Other Ambulatory Visit: Payer: Self-pay

## 2021-12-10 ENCOUNTER — Inpatient Hospital Stay: Payer: Medicare Other | Attending: Oncology

## 2021-12-10 VITALS — BP 144/79 | HR 64 | Temp 97.0°F | Resp 15 | Wt 140.9 lb

## 2021-12-10 DIAGNOSIS — C61 Malignant neoplasm of prostate: Secondary | ICD-10-CM

## 2021-12-10 DIAGNOSIS — Z5111 Encounter for antineoplastic chemotherapy: Secondary | ICD-10-CM | POA: Diagnosis present

## 2021-12-10 DIAGNOSIS — C7951 Secondary malignant neoplasm of bone: Secondary | ICD-10-CM | POA: Diagnosis not present

## 2021-12-10 DIAGNOSIS — Z79899 Other long term (current) drug therapy: Secondary | ICD-10-CM | POA: Diagnosis not present

## 2021-12-10 DIAGNOSIS — Z923 Personal history of irradiation: Secondary | ICD-10-CM | POA: Diagnosis not present

## 2021-12-10 DIAGNOSIS — Z79890 Hormone replacement therapy (postmenopausal): Secondary | ICD-10-CM | POA: Insufficient documentation

## 2021-12-10 DIAGNOSIS — M898X9 Other specified disorders of bone, unspecified site: Secondary | ICD-10-CM | POA: Diagnosis not present

## 2021-12-10 LAB — CMP (CANCER CENTER ONLY)
ALT: 11 U/L (ref 0–44)
AST: 19 U/L (ref 15–41)
Albumin: 4.3 g/dL (ref 3.5–5.0)
Alkaline Phosphatase: 23 U/L — ABNORMAL LOW (ref 38–126)
Anion gap: 5 (ref 5–15)
BUN: 21 mg/dL (ref 8–23)
CO2: 29 mmol/L (ref 22–32)
Calcium: 9.4 mg/dL (ref 8.9–10.3)
Chloride: 103 mmol/L (ref 98–111)
Creatinine: 1.14 mg/dL (ref 0.61–1.24)
GFR, Estimated: >60 mL/min (ref >60)
Glucose, Bld: 90 mg/dL (ref 70–99)
Potassium: 4.1 mmol/L (ref 3.5–5.1)
Sodium: 137 mmol/L (ref 135–145)
Total Bilirubin: 0.5 mg/dL (ref 0.3–1.2)
Total Protein: 6.4 g/dL — ABNORMAL LOW (ref 6.5–8.1)

## 2021-12-10 LAB — CBC WITH DIFFERENTIAL (CANCER CENTER ONLY)
Abs Immature Granulocytes: 0.01 10*3/uL (ref 0.00–0.07)
Basophils Absolute: 0 10*3/uL (ref 0.0–0.1)
Basophils Relative: 1 %
Eosinophils Absolute: 0.1 10*3/uL (ref 0.0–0.5)
Eosinophils Relative: 3 %
HCT: 32.9 % — ABNORMAL LOW (ref 39.0–52.0)
Hemoglobin: 11 g/dL — ABNORMAL LOW (ref 13.0–17.0)
Immature Granulocytes: 0 %
Lymphocytes Relative: 28 %
Lymphs Abs: 1.1 10*3/uL (ref 0.7–4.0)
MCH: 30.2 pg (ref 26.0–34.0)
MCHC: 33.4 g/dL (ref 30.0–36.0)
MCV: 90.4 fL (ref 80.0–100.0)
Monocytes Absolute: 0.4 10*3/uL (ref 0.1–1.0)
Monocytes Relative: 10 %
Neutro Abs: 2.3 10*3/uL (ref 1.7–7.7)
Neutrophils Relative %: 58 %
Platelet Count: 162 10*3/uL (ref 150–400)
RBC: 3.64 MIL/uL — ABNORMAL LOW (ref 4.22–5.81)
RDW: 14 % (ref 11.5–15.5)
WBC Count: 3.9 10*3/uL — ABNORMAL LOW (ref 4.0–10.5)
nRBC: 0 % (ref 0.0–0.2)

## 2021-12-10 MED ORDER — LEUPROLIDE ACETATE (3 MONTH) 22.5 MG ~~LOC~~ KIT
22.5000 mg | PACK | Freq: Once | SUBCUTANEOUS | Status: AC
  Administered 2021-12-10: 22.5 mg via SUBCUTANEOUS
  Filled 2021-12-10: qty 22.5

## 2021-12-10 NOTE — Progress Notes (Signed)
Hematology and Oncology Follow Up Visit  Kelcy Baeten [id number] Aug 14, 1940 81 y.o. 12/10/2021 8:03 AM    Principle Diagnosis:  81 year old man with advanced prostate cancer with disease to the bone diagnosed in 2013.  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  Prior Therapy: 1. He is status post radiation therapy and 2 years of Lupron for locally advanced prostate cancer at the time of diagnosis in 2000.  2.  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  He was started on Lupron at that time.  3. He is S/P radiation therapy directed to the right hip (30 gray in 12 fractions) concluded in 07/2011 for palliative purposes.   4. Xgeva given every 3 months started in 10/2011.  Therapy discontinued due to based on patient's preference.  Current therapy:   Eligard every 3 months.  He returns for injection today.   Interim History:  Mr. Nam is here for a follow-up visit.  Since the last visit, he reports feeling well without any major complaints.  And denies any bone pain or pathological fractures.  Denies any hot flashes or weakness.  He does report sexual dysfunction but overall managing.  He denies any recent hospitalizations or illnesses.      Medications: Reviewed without changes. Current Outpatient Medications  Medication Sig Dispense Refill   aspirin EC 81 MG EC tablet Take 1 tablet (81 mg total) by mouth daily. 30 tablet 11   Calcium Carbonate-Vitamin D (CALCIUM-VITAMIN D) 500-200 MG-UNIT per tablet Take 1 tablet by mouth daily.     clonazePAM (KLONOPIN) 1 MG tablet Take 2 tablets (2 mg total) by mouth at bedtime.     clopidogrel (PLAVIX) 75 MG tablet TAKE ONE TABLET BY MOUTH DAILY 90 tablet 2   guaiFENesin (MUCINEX) 600 MG 12 hr tablet Take 600 mg by mouth 2 (two) times daily as needed for cough or to loosen phlegm.     isosorbide mononitrate (IMDUR) 30 MG 24 hr tablet TAKE ONE  TABLET BY MOUTH DAILY 90 tablet 1   leuprolide (LUPRON) 22.5 MG injection Inject 22.5 mg into the muscle every 3 (three) months.     levothyroxine (SYNTHROID, LEVOTHROID) 50 MCG tablet Take 50 mcg by mouth daily before breakfast.      Melatonin 10 MG TABS Take 10 mg by mouth at bedtime.     nitroGLYCERIN (NITROSTAT) 0.4 MG SL tablet Place 1 tablet (0.4 mg total) under the tongue every 5 (five) minutes as needed for chest pain. 25 tablet 3   pantoprazole (PROTONIX) 40 MG tablet Take 40 mg by mouth 2 (two) times daily before a meal.     simvastatin (ZOCOR) 40 MG tablet Take 40 mg by mouth at bedtime.     venlafaxine XR (EFFEXOR-XR) 75 MG 24 hr capsule Take 75 mg by mouth daily. Pt states taking one half tablet 75 mg by mouth daily.     vitamin B-12 (CYANOCOBALAMIN) 1000 MCG tablet Take 1,000 mcg by mouth daily.     No current facility-administered medications for this visit.     Allergies:  Allergies  Allergen Reactions   Penicillins Other (See Comments)    Passed out   Phenazopyridine Hcl Hives   Ramipril Hives       Physical Exam:     Blood pressure (!) 144/79, pulse 64, temperature (!) 97 F (36.1 C), temperature source Temporal, resp. rate 15, weight 140 lb  14.4 oz (63.9 kg), SpO2 100 %.        ECOG: 1     General appearance: Comfortable appearing without any discomfort Head: Normocephalic without any trauma Oropharynx: Mucous membranes are moist and pink without any thrush or ulcers. Eyes: Pupils are equal and round reactive to light. Lymph nodes: No cervical, supraclavicular, inguinal or axillary lymphadenopathy.   Heart:regular rate and rhythm.  S1 and S2 without leg edema. Lung: Clear without any rhonchi or wheezes.  No dullness to percussion. Abdomin: Soft, nontender, nondistended with good bowel sounds.  No hepatosplenomegaly. Musculoskeletal: No joint deformity or effusion.  Full range of motion noted. Neurological: No deficits noted on motor, sensory and  deep tendon reflex exam. Skin: No petechial rash or dryness.  Appeared moist.                Lab Results: Lab Results  Component Value Date   WBC 5.8 06/11/2021   HGB 11.5 (L) 06/11/2021   HCT 35.0 (L) 06/11/2021   MCV 88.2 06/11/2021   PLT 178 06/11/2021        Latest Reference Range & Units 11/22/19 08:11 02/21/20 09:21 05/21/20 09:25 09/08/20 09:50 12/10/20 08:53 06/11/21 09:08  Prostate Specific Ag, Serum 0.0 - 4.0 ng/mL 0.3 0.3 0.3 0.4 0.5 0.6               Impression and Plan:   81 year old man with:   1.  Advanced prostate cancer with disease to the bone diagnosed in 2013.  He has castration-sensitive disease at this time.  His disease status was updated at this time and treatment choices were reviewed.  PSA continues to be reasonably low but increasing very slowly with a doubling time that is close to 2 years at this time.  Given his low volume slow change in his PSA I recommended continued current therapy.  Additional therapy utilizing androgen receptor pathway inhibitors will be added if he has symptomatic progression.  He is agreeable to proceed at this time.   2. Bone pain: No pain noted at this time.  3. Bone directed therapy: He continues to be on calcium and vitamin D supplements.  He declined any additional Xgeva.  4. Androgen depravation: I recommended resuming Eligard every 3 months.  Complication clinic weight gain and hot flashes among others were reiterated.  He prefers to take the injection every 6 months with the current dose.  5. Follow up: In 6 months for repeat follow-up.  30 minutes were spent on this visit.  The time was dedicated to reviewing laboratory data, disease status update and outlining future plan of care discussion.   Zola Button, MD 6/29/20238:03 AM

## 2021-12-10 NOTE — Patient Instructions (Signed)
Leuprolide depot injection What is this medication? LEUPROLIDE (loo PROE lide) is a man-made protein that acts like a natural hormone in the body. It decreases testosterone in men and decreases estrogen in women. In men, this medicine is used to treat advanced prostate cancer. In women, some forms of this medicine may be used to treat endometriosis, uterine fibroids, or other male hormone-related problems. This medicine may be used for other purposes; ask your health care provider or pharmacist if you have questions. COMMON BRAND NAME(S): Eligard, Fensolv, Lupron Depot, Lupron Depot-Ped, Viadur What should I tell my care team before I take this medication? They need to know if you have any of these conditions: diabetes heart disease or previous heart attack high blood pressure high cholesterol mental illness osteoporosis pain or difficulty passing urine seizures spinal cord metastasis stroke suicidal thoughts, plans, or attempt; a previous suicide attempt by you or a family member tobacco smoker unusual vaginal bleeding (women) an unusual or allergic reaction to leuprolide, benzyl alcohol, other medicines, foods, dyes, or preservatives pregnant or trying to get pregnant breast-feeding How should I use this medication? This medicine is for injection into a muscle or for injection under the skin. It is given by a health care professional in a hospital or clinic setting. The specific product will determine how it will be given to you. Make sure you understand which product you receive and how often you will receive it. Talk to your pediatrician regarding the use of this medicine in children. Special care may be needed. Overdosage: If you think you have taken too much of this medicine contact a poison control center or emergency room at once. NOTE: This medicine is only for you. Do not share this medicine with others. What if I miss a dose? It is important not to miss a dose. Call your  doctor or health care professional if you are unable to keep an appointment. Depot injections: Depot injections are given either once-monthly, every 12 weeks, every 16 weeks, or every 24 weeks depending on the product you are prescribed. The product you are prescribed will be based on if you are male or male, and your condition. Make sure you understand your product and dosing. What may interact with this medication? Do not take this medicine with any of the following medications: chasteberry cisapride dronedarone pimozide thioridazine This medicine may also interact with the following medications: herbal or dietary supplements, like black cohosh or DHEA male hormones, like estrogens or progestins and birth control pills, patches, rings, or injections male hormones, like testosterone other medicines that prolong the QT interval (abnormal heart rhythm) This list may not describe all possible interactions. Give your health care provider a list of all the medicines, herbs, non-prescription drugs, or dietary supplements you use. Also tell them if you smoke, drink alcohol, or use illegal drugs. Some items may interact with your medicine. What should I watch for while using this medication? Visit your doctor or health care professional for regular checks on your progress. During the first weeks of treatment, your symptoms may get worse, but then will improve as you continue your treatment. You may get hot flashes, increased bone pain, increased difficulty passing urine, or an aggravation of nerve symptoms. Discuss these effects with your doctor or health care professional, some of them may improve with continued use of this medicine. Male patients may experience a menstrual cycle or spotting during the first months of therapy with this medicine. If this continues, contact your doctor or  health care professional. This medicine may increase blood sugar. Ask your healthcare provider if changes in diet  or medicines are needed if you have diabetes. What side effects may I notice from receiving this medication? Side effects that you should report to your doctor or health care professional as soon as possible: allergic reactions like skin rash, itching or hives, swelling of the face, lips, or tongue breathing problems chest pain depression or memory disorders pain in your legs or groin pain at site where injected or implanted seizures severe headache signs and symptoms of high blood sugar such as being more thirsty or hungry or having to urinate more than normal. You may also feel very tired or have blurry vision swelling of the feet and legs suicidal thoughts or other mood changes visual changes vomiting Side effects that usually do not require medical attention (report to your doctor or health care professional if they continue or are bothersome): breast swelling or tenderness decrease in sex drive or performance diarrhea hot flashes loss of appetite muscle, joint, or bone pains nausea redness or irritation at site where injected or implanted skin problems or acne This list may not describe all possible side effects. Call your doctor for medical advice about side effects. You may report side effects to FDA at 1-800-FDA-1088. Where should I keep my medication? This drug is given in a hospital or clinic and will not be stored at home. NOTE: This sheet is a summary. It may not cover all possible information. If you have questions about this medicine, talk to your doctor, pharmacist, or health care provider.  2023 Elsevier/Gold Standard (2021-05-01 00:00:00)

## 2021-12-11 LAB — PROSTATE-SPECIFIC AG, SERUM (LABCORP): Prostate Specific Ag, Serum: 0.7 ng/mL (ref 0.0–4.0)

## 2021-12-14 NOTE — Progress Notes (Unsigned)
Cardiology Office Note  Date: 12/17/2021   ID: Russell Flattery Sr., DOB July 08, 1940, MRN [id number]  PCP:  [facility]  Cardiologist:  Rozann Lesches, MD Electrophysiologist:  Thompson Grayer, MD   Chief Complaint  Patient presents with   Cardiac follow-up    History of Present Illness: Russell Elman. is an 81 y.o. male last seen in July 2022.  He is here today with family member for a follow-up visit.  From a cardiac perspective he has done well, no significant palpitations or angina symptoms, no nitroglycerin use.  Still functional with ADLs including outdoor chores.  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 Rayann Heman in May.  Device function was normal at that time.  I personally reviewed his ECG today which shows an atrial paced rhythm.  I went over his medications which are outlined below and stable from a cardiac perspective.  He continues to see Dr. Quillian Quince for primary care.  Past Medical History:  Diagnosis Date   Anemia    CAD (coronary artery disease), native coronary artery    a. DES to proximal, mid, and distal RCA 2006. b. 12/2014 - [place] s/p  PTCA to the mid-distal previously placed RCA stents, otherwise nonobstructive CAD for continued medical therapy.   CHF (congestive heart failure) (HCC)    History of TIAs    Mixed hyperlipidemia    Pacemaker lead failure    RA lead insulation defect noted at Generator change by Dr Caryl Comes 2008, impedance now low, but lead functioning otherwise normally   PAF (paroxysmal atrial fibrillation) [facility])    Prostate cancer metastatic to bone [facility])    Sinus node dysfunction [facility])    Medtronic pacemaker - Dr. Rayann Heman    Past Surgical History:  Procedure Laterality Date   CARDIAC CATHETERIZATION N/A 01/06/2015   Procedure: Left Heart Cath and Coronary Angiography;  Surgeon: Troy Sine, MD;  Location: [place] [facility];  Service: Cardiovascular;  Laterality: N/A;   LEAD REVISION/REPAIR N/A 08/25/2020   Procedure:  LEAD REVISION/REPAIR;  Surgeon: Vickie Epley, MD;  Location: [place] [facility];  Service: Cardiovascular;  Laterality: N/A;   PACEMAKER IMPLANT N/A 08/25/2020   Procedure: PACEMAKER IMPLANT;  Surgeon: Vickie Epley, MD;  Location: [place] [facility];  Service: Cardiovascular;  Laterality: N/A;   PACEMAKER INSERTION     MDT   PPM GENERATOR CHANGEOUT N/A 04/11/2020   Procedure: PPM GENERATOR CHANGEOUT;  Surgeon: Deboraha Sprang, MD;  Location: [place] [facility];  Service: Cardiovascular;  Laterality: N/A;    Current Outpatient Medications  Medication Sig Dispense Refill   aspirin EC 81 MG EC tablet Take 1 tablet (81 mg total) by mouth daily. 30 tablet 11   Calcium Carbonate-Vitamin D (CALCIUM-VITAMIN D) 500-200 MG-UNIT per tablet Take 1 tablet by mouth daily.     clonazePAM (KLONOPIN) 1 MG tablet Take 2 tablets (2 mg total) by mouth at bedtime.     clopidogrel (PLAVIX) 75 MG tablet TAKE ONE TABLET BY MOUTH DAILY 90 tablet 2   guaiFENesin (MUCINEX) 600 MG 12 hr tablet Take 600 mg by mouth 2 (two) times daily as needed for cough or to loosen phlegm.     isosorbide mononitrate (IMDUR) 30 MG 24 hr tablet TAKE ONE TABLET BY MOUTH DAILY 90 tablet 1   leuprolide (LUPRON) 22.5 MG injection Inject 22.5 mg into the muscle every 3 (three) months.     levothyroxine (SYNTHROID, LEVOTHROID) 50 MCG tablet Take 50  mcg by mouth daily before breakfast.      Melatonin 10 MG TABS Take 10 mg by mouth at bedtime.     nitroGLYCERIN (NITROSTAT) 0.4 MG SL tablet Place 1 tablet (0.4 mg total) under the tongue every 5 (five) minutes as needed for chest pain. 25 tablet 3   pantoprazole (PROTONIX) 40 MG tablet Take 40 mg by mouth 2 (two) times daily before a meal.     simvastatin (ZOCOR) 40 MG tablet Take 40 mg by mouth at bedtime.     venlafaxine XR (EFFEXOR-XR) 75 MG 24 hr capsule Take 75 mg by mouth daily. Pt states taking one half tablet 75 mg by mouth daily.     vitamin B-12 (CYANOCOBALAMIN) 1000 MCG  tablet Take 1,000 mcg by mouth daily.     No current facility-administered medications for this visit.   Allergies:  Penicillins, Phenazopyridine hcl, and Ramipril   ROS: No orthopnea or PND.  Physical Exam: VS:  BP 118/64   Pulse (!) 59   Ht '5\' 9"'$  (1.753 m)   Wt 141 lb 12.8 oz (64.3 kg)   SpO2 97%   BMI 20.94 kg/m , BMI Body mass index is 20.94 kg/m.  Wt Readings from Last 3 Encounters:  12/17/21 141 lb 12.8 oz (64.3 kg)  12/10/21 140 lb 14.4 oz (63.9 kg)  10/23/21 144 lb 9.6 oz (65.6 kg)    General: Patient appears comfortable at rest. HEENT: Conjunctiva and lids normal, oropharynx clear. Neck: Supple, no elevated JVP or carotid bruits, no thyromegaly. Lungs: Clear to auscultation, nonlabored breathing at rest. Cardiac: Regular rate and rhythm, no S3 or significant systolic murmur. Extremities: No pitting edema.  ECG:  An ECG dated 11/28/2020 was personally reviewed today and demonstrated:  Sinus rhythm.  Recent Labwork: 12/10/2021: ALT 11; AST 19; BUN 21; Creatinine 1.14; Hemoglobin 11.0; Platelet Count 162; Potassium 4.1; Sodium 137   Other Studies Reviewed Today:  Lexiscan Myoview 12/05/2019: There was no ST segment deviation noted during stress. The study is normal. There are no perfusion defects consistent with prior infarct or current ischemia. This is a low risk study. The left ventricular ejection fraction is low normal (52%).   Echocardiogram 07/30/2020:  1. Left ventricular ejection fraction, by estimation, is 45 to 50%. The  left ventricle has low normal function. Left ventricular endocardial  border not optimally defined to evaluate regional wall motion. Left  ventricular diastolic parameters are  indeterminate. The average left ventricular global longitudinal strain is  -16.7 %.   2. Right ventricular systolic function is normal. The right ventricular  size is normal.   3. The mitral valve is normal in structure. Mild mitral valve  regurgitation. No  evidence of mitral stenosis.   4. The aortic valve was not well visualized. There is mild calcification  of the aortic valve. There is mild thickening of the aortic valve. Aortic  valve regurgitation is mild to moderate. No aortic stenosis is present.   5. The inferior vena cava is normal in size with greater than 50%  respiratory variability, suggesting right atrial pressure of 3 mmHg.   Assessment and Plan:  1.  CAD status post DES to the proximal, mid, and distal RCA in 2006 with angioplasty of the mid and distal stent sites in 2016.  He reports no angina on medical therapy, no nitroglycerin use.  Follow-up Lexiscan Myoview in June 2021 was low risk without active ischemia.  Continue aspirin, Plavix, Imdur, Zocor, and as needed nitroglycerin.  2.  Sinus node dysfunction with Medtronic pacemaker in place and follow-up with Dr. Rayann Heman.  Last device check showed normal function.  3.  Paroxysmal atrial fibrillation noted by device interrogation with very low rhythm burden.  CHA2DS2-VASc score is 5.  As per prior discussion with EP, holding off on anticoagulation unless rhythm frequency increases.  Medication Adjustments/Labs and Tests Ordered: Current medicines are reviewed at length with the patient today.  Concerns regarding medicines are outlined above.   Tests Ordered: Orders Placed This Encounter  Procedures   EKG 12-Lead    Medication Changes: No orders of the defined types were placed in this encounter.   Disposition:  Follow up  6 months.  Signed, Satira Sark, MD, [facility] 12/17/2021 12:00 PM    [facility], [place], [place] [address] Phone: [phone]; Fax: [phone]

## 2021-12-17 ENCOUNTER — Ambulatory Visit (INDEPENDENT_AMBULATORY_CARE_PROVIDER_SITE_OTHER): Payer: Medicare Other | Admitting: Cardiology

## 2021-12-17 ENCOUNTER — Telehealth: Payer: Self-pay | Admitting: *Deleted

## 2021-12-17 ENCOUNTER — Encounter: Payer: Self-pay | Admitting: Cardiology

## 2021-12-17 VITALS — BP 118/64 | HR 59 | Ht 69.0 in | Wt 141.8 lb

## 2021-12-17 DIAGNOSIS — I495 Sick sinus syndrome: Secondary | ICD-10-CM | POA: Diagnosis not present

## 2021-12-17 DIAGNOSIS — I25119 Atherosclerotic heart disease of native coronary artery with unspecified angina pectoris: Secondary | ICD-10-CM

## 2021-12-17 NOTE — Patient Instructions (Signed)

## 2021-12-17 NOTE — Telephone Encounter (Signed)
-----   Message from Wyatt Portela, MD sent at 12/17/2021  1:58 PM EDT ----- This is not related to Miranda. He needs to discuss with his cardiologist if these issues persist. ----- Message ----- From: Rolene Course, RN Sent: 12/17/2021   1:56 PM EDT To: Wyatt Portela, MD  Mr Flock's grandaughter called today, he received eligard on 6/29 - she said he usually has some blurry vision & unsteadiness for 1-2 days after the injection.  However, this time he is still having these sx's now, 1 week later, and she also said his BP has been quite low.  What is your advice?  Thanks, Bethena Roys

## 2021-12-17 NOTE — Telephone Encounter (Signed)
PC to patient's grandaughter, Freida Busman - informed her of Dr. Hazeline Junker message below.  Also instructed her to call back to this office next week if patient continues to have these symptoms.  She verbalizes understanding.

## 2022-01-11 ENCOUNTER — Ambulatory Visit (INDEPENDENT_AMBULATORY_CARE_PROVIDER_SITE_OTHER): Payer: Medicare Other

## 2022-01-11 DIAGNOSIS — I495 Sick sinus syndrome: Secondary | ICD-10-CM

## 2022-01-11 LAB — CUP PACEART REMOTE DEVICE CHECK
Battery Remaining Longevity: 149 mo
Battery Voltage: 3.02 V
Brady Statistic AP VP Percent: 0.03 %
Brady Statistic AP VS Percent: 73.46 %
Brady Statistic AS VP Percent: 0 %
Brady Statistic AS VS Percent: 26.51 %
Brady Statistic RA Percent Paced: 73.54 %
Brady Statistic RV Percent Paced: 0.03 %
Date Time Interrogation Session: 20230731010400
Implantable Lead Implant Date: 20220314
Implantable Lead Implant Date: 20220314
Implantable Lead Location: 753859
Implantable Lead Location: 753860
Implantable Lead Model: 5076
Implantable Lead Model: 5076
Implantable Pulse Generator Implant Date: 20211029
Lead Channel Impedance Value: 342 Ohm
Lead Channel Impedance Value: 342 Ohm
Lead Channel Impedance Value: 399 Ohm
Lead Channel Impedance Value: 418 Ohm
Lead Channel Pacing Threshold Amplitude: 0.5 V
Lead Channel Pacing Threshold Amplitude: 0.875 V
Lead Channel Pacing Threshold Pulse Width: 0.4 ms
Lead Channel Pacing Threshold Pulse Width: 0.4 ms
Lead Channel Sensing Intrinsic Amplitude: 2.75 mV
Lead Channel Sensing Intrinsic Amplitude: 2.75 mV
Lead Channel Sensing Intrinsic Amplitude: 7.375 mV
Lead Channel Sensing Intrinsic Amplitude: 7.375 mV
Lead Channel Setting Pacing Amplitude: 1.5 V
Lead Channel Setting Pacing Amplitude: 2 V
Lead Channel Setting Pacing Pulse Width: 0.4 ms
Lead Channel Setting Sensing Sensitivity: 1.2 mV

## 2022-02-14 NOTE — Progress Notes (Signed)
Remote pacemaker transmission.   

## 2022-03-10 ENCOUNTER — Other Ambulatory Visit: Payer: Self-pay | Admitting: Cardiology

## 2022-04-12 ENCOUNTER — Ambulatory Visit: Payer: Medicare Other | Attending: Cardiovascular Disease

## 2022-04-12 DIAGNOSIS — Z950 Presence of cardiac pacemaker: Secondary | ICD-10-CM

## 2022-04-12 DIAGNOSIS — I495 Sick sinus syndrome: Secondary | ICD-10-CM

## 2022-04-13 LAB — CUP PACEART REMOTE DEVICE CHECK
Battery Remaining Longevity: 145 mo
Battery Voltage: 3.02 V
Brady Statistic AP VP Percent: 0.03 %
Brady Statistic AP VS Percent: 73.88 %
Brady Statistic AS VP Percent: 0 %
Brady Statistic AS VS Percent: 26.08 %
Brady Statistic RA Percent Paced: 73.88 %
Brady Statistic RV Percent Paced: 0.03 %
Date Time Interrogation Session: 20231030062245
Implantable Lead Connection Status: 753985
Implantable Lead Connection Status: 753985
Implantable Lead Implant Date: 20220314
Implantable Lead Implant Date: 20220314
Implantable Lead Location: 753859
Implantable Lead Location: 753860
Implantable Lead Model: 5076
Implantable Lead Model: 5076
Implantable Pulse Generator Implant Date: 20211029
Lead Channel Impedance Value: 342 Ohm
Lead Channel Impedance Value: 342 Ohm
Lead Channel Impedance Value: 399 Ohm
Lead Channel Impedance Value: 418 Ohm
Lead Channel Pacing Threshold Amplitude: 0.5 V
Lead Channel Pacing Threshold Amplitude: 0.625 V
Lead Channel Pacing Threshold Pulse Width: 0.4 ms
Lead Channel Pacing Threshold Pulse Width: 0.4 ms
Lead Channel Sensing Intrinsic Amplitude: 3.125 mV
Lead Channel Sensing Intrinsic Amplitude: 3.125 mV
Lead Channel Sensing Intrinsic Amplitude: 8.375 mV
Lead Channel Sensing Intrinsic Amplitude: 8.375 mV
Lead Channel Setting Pacing Amplitude: 1.5 V
Lead Channel Setting Pacing Amplitude: 2 V
Lead Channel Setting Pacing Pulse Width: 0.4 ms
Lead Channel Setting Sensing Sensitivity: 1.2 mV
Zone Setting Status: 755011

## 2022-05-15 NOTE — Progress Notes (Signed)
Remote pacemaker transmission.   

## 2022-05-30 ENCOUNTER — Other Ambulatory Visit: Payer: Self-pay | Admitting: Cardiology

## 2022-06-10 ENCOUNTER — Inpatient Hospital Stay (HOSPITAL_BASED_OUTPATIENT_CLINIC_OR_DEPARTMENT_OTHER): Payer: Medicare Other | Admitting: Oncology

## 2022-06-10 ENCOUNTER — Inpatient Hospital Stay: Payer: Medicare Other | Attending: Oncology

## 2022-06-10 ENCOUNTER — Inpatient Hospital Stay: Payer: Medicare Other

## 2022-06-10 ENCOUNTER — Other Ambulatory Visit: Payer: Self-pay

## 2022-06-10 VITALS — BP 138/78 | HR 89 | Temp 97.7°F | Resp 18 | Ht 69.0 in | Wt 141.1 lb

## 2022-06-10 VITALS — BP 113/60 | HR 66 | Temp 98.3°F

## 2022-06-10 DIAGNOSIS — C7951 Secondary malignant neoplasm of bone: Secondary | ICD-10-CM | POA: Diagnosis present

## 2022-06-10 DIAGNOSIS — C61 Malignant neoplasm of prostate: Secondary | ICD-10-CM | POA: Diagnosis present

## 2022-06-10 DIAGNOSIS — Z5111 Encounter for antineoplastic chemotherapy: Secondary | ICD-10-CM | POA: Insufficient documentation

## 2022-06-10 LAB — CBC WITH DIFFERENTIAL (CANCER CENTER ONLY)
Abs Immature Granulocytes: 0.01 10*3/uL (ref 0.00–0.07)
Basophils Absolute: 0 10*3/uL (ref 0.0–0.1)
Basophils Relative: 1 %
Eosinophils Absolute: 0.1 10*3/uL (ref 0.0–0.5)
Eosinophils Relative: 2 %
HCT: 35 % — ABNORMAL LOW (ref 39.0–52.0)
Hemoglobin: 11.7 g/dL — ABNORMAL LOW (ref 13.0–17.0)
Immature Granulocytes: 0 %
Lymphocytes Relative: 24 %
Lymphs Abs: 1.3 10*3/uL (ref 0.7–4.0)
MCH: 30.5 pg (ref 26.0–34.0)
MCHC: 33.4 g/dL (ref 30.0–36.0)
MCV: 91.1 fL (ref 80.0–100.0)
Monocytes Absolute: 0.5 10*3/uL (ref 0.1–1.0)
Monocytes Relative: 9 %
Neutro Abs: 3.5 10*3/uL (ref 1.7–7.7)
Neutrophils Relative %: 64 %
Platelet Count: 165 10*3/uL (ref 150–400)
RBC: 3.84 MIL/uL — ABNORMAL LOW (ref 4.22–5.81)
RDW: 13.9 % (ref 11.5–15.5)
WBC Count: 5.5 10*3/uL (ref 4.0–10.5)
nRBC: 0 % (ref 0.0–0.2)

## 2022-06-10 LAB — CMP (CANCER CENTER ONLY)
ALT: 9 U/L (ref 0–44)
AST: 15 U/L (ref 15–41)
Albumin: 4.2 g/dL (ref 3.5–5.0)
Alkaline Phosphatase: 23 U/L — ABNORMAL LOW (ref 38–126)
Anion gap: 4 — ABNORMAL LOW (ref 5–15)
BUN: 13 mg/dL (ref 8–23)
CO2: 30 mmol/L (ref 22–32)
Calcium: 9.4 mg/dL (ref 8.9–10.3)
Chloride: 104 mmol/L (ref 98–111)
Creatinine: 0.95 mg/dL (ref 0.61–1.24)
GFR, Estimated: >60 mL/min (ref >60)
Glucose, Bld: 88 mg/dL (ref 70–99)
Potassium: 4 mmol/L (ref 3.5–5.1)
Sodium: 138 mmol/L (ref 135–145)
Total Bilirubin: 0.3 mg/dL (ref 0.3–1.2)
Total Protein: 6.5 g/dL (ref 6.5–8.1)

## 2022-06-10 MED ORDER — LEUPROLIDE ACETATE (3 MONTH) 22.5 MG ~~LOC~~ KIT
22.5000 mg | PACK | Freq: Once | SUBCUTANEOUS | Status: AC
  Administered 2022-06-10: 22.5 mg via SUBCUTANEOUS
  Filled 2022-06-10: qty 22.5

## 2022-06-10 NOTE — Progress Notes (Signed)
Hematology and Oncology Follow Up Visit  Russell Browning [id number] [birth year] 81 y.o. 06/10/2022 10:13 AM    Principle Diagnosis:  81 year old man with castration-sensitive advanced prostate cancer with disease to the bone diagnosed in 2013.  He presented with localized disease in 2013.  Prior Therapy: 1. He is status post radiation therapy and 2 years of Lupron for locally advanced prostate cancer at the time of diagnosis in 2000.  2.  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  He was started on Lupron at that time.  3. He is S/P radiation therapy directed to the right hip (30 gray in 12 fractions) concluded in 07/2011 for palliative purposes.   4. Xgeva given every 3 months started in 10/2011.  Therapy discontinued due to based on patient's preference.  Current therapy:   Eligard every 3 months started in 2013.  He opted to receive it every 6 months with the current dose.  He returns for injection today.   Interim History:  Russell Browning presents today for repeat follow-up.  Since last visit, he reports feeling well without any major complaints.  He denies any nausea, vomiting or abdominal pain.  He denies any bone pain or pathological fractures.  He denies any hospitalizations or illnesses.  He continues to tolerate Eligard every 6 months without any issues.  He denies any hot flashes or weakness.      Medications: Updated on review. Current Outpatient Medications  Medication Sig Dispense Refill   aspirin EC 81 MG EC tablet Take 1 tablet (81 mg total) by mouth daily. 30 tablet 11   Calcium Carbonate-Vitamin D (CALCIUM-VITAMIN D) 500-200 MG-UNIT per tablet Take 1 tablet by mouth daily.     clonazePAM (KLONOPIN) 1 MG tablet Take 2 tablets (2 mg total) by mouth at bedtime.     clopidogrel (PLAVIX) 75 MG tablet TAKE ONE TABLET BY MOUTH DAILY 90 tablet 3   guaiFENesin (MUCINEX) 600 MG 12 hr tablet Take 600 mg by mouth 2 (two) times  daily as needed for cough or to loosen phlegm.     isosorbide mononitrate (IMDUR) 30 MG 24 hr tablet TAKE ONE TABLET BY MOUTH DAILY 90 tablet 1   leuprolide (LUPRON) 22.5 MG injection Inject 22.5 mg into the muscle every 3 (three) months.     levothyroxine (SYNTHROID, LEVOTHROID) 50 MCG tablet Take 50 mcg by mouth daily before breakfast.      Melatonin 10 MG TABS Take 10 mg by mouth at bedtime.     nitroGLYCERIN (NITROSTAT) 0.4 MG SL tablet Place 1 tablet (0.4 mg total) under the tongue every 5 (five) minutes as needed for chest pain. 25 tablet 3   pantoprazole (PROTONIX) 40 MG tablet Take 40 mg by mouth 2 (two) times daily before a meal.     simvastatin (ZOCOR) 40 MG tablet Take 40 mg by mouth at bedtime.     venlafaxine XR (EFFEXOR-XR) 75 MG 24 hr capsule Take 75 mg by mouth daily. Pt states taking one half tablet 75 mg by mouth daily.     vitamin B-12 (CYANOCOBALAMIN) 1000 MCG tablet Take 1,000 mcg by mouth daily.     No current facility-administered medications for this visit.     Allergies:  Allergies  Allergen Reactions   Penicillins Other (See Comments)    Passed out   Phenazopyridine Hcl Hives   Ramipril Hives       Physical Exam:  Blood pressure 138/78, pulse 89, temperature 97.7 F (36.5 C), temperature source Temporal, resp. rate 18, height '5\' 9"'$  (1.753 m), weight 141 lb 1.6 oz (64 kg), SpO2 99 %.     ECOG: 1     General appearance: Alert, awake without any distress. Head: Atraumatic without abnormalities Oropharynx: Without any thrush or ulcers. Eyes: No scleral icterus. Lymph nodes: No lymphadenopathy noted in the cervical, supraclavicular, or axillary nodes Heart:regular rate and rhythm, without any murmurs or gallops.   Lung: Clear to auscultation without any rhonchi, wheezes or dullness to percussion. Abdomin: Soft, nontender without any shifting dullness or ascites. Musculoskeletal: No clubbing or cyanosis. Neurological: No motor or  sensory deficits. Skin: No rashes or lesions.                 Lab Results: Lab Results  Component Value Date   WBC 3.9 (L) 12/10/2021   HGB 11.0 (L) 12/10/2021   HCT 32.9 (L) 12/10/2021   MCV 90.4 12/10/2021   PLT 162 12/10/2021        Latest Reference Range & Units 06/11/21 09:08 12/10/21 08:02  Prostate Specific Ag, Serum 0.0 - 4.0 ng/mL 0.6 0.7               Impression and Plan:   81 year old man with:   1.  Castration-sensitive advanced prostate cancer with disease to the bone diagnosed in 2013.   The natural course of his disease and treatment options were discussed at this time.  He remains on androgen deprivation therapy alone without any therapy escalation for the last 10 years.  His PSA remains under reasonable control although slowly rising marginally.  Risks and benefits of therapy escalation has been discussed previously and that this option has been deferred based on his preferences and overall his disease control.  Based on these findings, I have recommended continued monitoring given the slow rise in his PSA.  If he develops a rapid doubling time in his PSA, we will obtain PSMA PET scan and likely start androgen receptor pathway inhibitor.  These could be in the form of abiraterone, enzalutamide, apalutamide among others.   2. Bone pain: No issues reported at this time.  3. Bone directed therapy: He has been on Xgeva in the past and opted to discontinue it due to poor tolerance and side effects.  I recommended calcium and vitamin D supplements for the time being..  4. Androgen depravation: He is currently on Eligard every 3 to 6 months.  He opted to proceed with 6 months of treatment at a reduced dose.  I will update his testosterone level with the next visit to ensure adequate castration levels.  5. Follow up: In 6 months for repeat follow-up.  We will set up follow-up at [facility] for convenience given his residence in [place].  30  minutes were dedicated to this encounter.  The time was spent on updating disease status, treatment choices and outlining future plan of care discussion.   Zola Button, MD 12/28/202310:13 AM

## 2022-06-11 LAB — PROSTATE-SPECIFIC AG, SERUM (LABCORP): Prostate Specific Ag, Serum: 1 ng/mL (ref 0.0–4.0)

## 2022-06-27 NOTE — Progress Notes (Unsigned)
Cardiology Office Note  Date: 06/28/2022   ID: Russell Flattery Sr., DOB 21-Feb-1941, MRN [id number]  PCP:  [facility]  Cardiologist:  Rozann Lesches, MD Electrophysiologist:  Thompson Grayer, MD   Chief Complaint  Patient presents with   Cardiac follow-up    History of Present Illness: Russell Eisner. is an 82 y.o. male last seen in July 2023.  He is here for a routine visit.  Reports no angina or nitroglycerin use.  No specific change in stamina, NYHA class II dyspnea with most activities.  No palpitations or syncope.  He did have some trouble with orthostasis and intermittent hypotension, Imdur was cut to 15 mg daily by PCP and he feels better at this point.  Medtronic pacemaker in place, now followed by Dr. Myles Gip.  Device interrogation in October 2023 revealed normal function.  I reviewed his recent lab work.  Recent LDL was 69 on Zocor.  Past Medical History:  Diagnosis Date   Anemia    CAD (coronary artery disease), native coronary artery    a. DES to proximal, mid, and distal RCA 2006. b. 12/2014 - [place] s/p  PTCA to the mid-distal previously placed RCA stents, otherwise nonobstructive CAD for continued medical therapy.   CHF (congestive heart failure) (HCC)    History of TIAs    Mixed hyperlipidemia    Pacemaker lead failure    RA lead insulation defect noted at Generator change by Dr Caryl Comes 2008, impedance now low, but lead functioning otherwise normally   PAF (paroxysmal atrial fibrillation) ([place])    Prostate cancer metastatic to bone [facility])    Sinus node dysfunction ([place])    Medtronic pacemaker - Dr. Rayann Heman    Current Outpatient Medications  Medication Sig Dispense Refill   aspirin EC 81 MG EC tablet Take 1 tablet (81 mg total) by mouth daily. 30 tablet 11   Calcium Carbonate-Vitamin D (CALCIUM-VITAMIN D) 500-200 MG-UNIT per tablet Take 1 tablet by mouth daily.     cefPROZIL (CEFZIL) 500 MG tablet Take by mouth.     clonazePAM (KLONOPIN) 1 MG tablet  Take 2 tablets (2 mg total) by mouth at bedtime.     clopidogrel (PLAVIX) 75 MG tablet TAKE ONE TABLET BY MOUTH DAILY 90 tablet 3   fluticasone (FLONASE) 50 MCG/ACT nasal spray Place 1 spray into both nostrils 2 (two) times daily.     guaiFENesin (MUCINEX) 600 MG 12 hr tablet Take 600 mg by mouth 2 (two) times daily as needed for cough or to loosen phlegm.     isosorbide mononitrate (IMDUR) 30 MG 24 hr tablet Take 0.5 tablets (15 mg total) by mouth daily. 45 tablet 3   leuprolide (LUPRON) 22.5 MG injection Inject 22.5 mg into the muscle every 3 (three) months.     levothyroxine (SYNTHROID, LEVOTHROID) 50 MCG tablet Take 50 mcg by mouth daily before breakfast.      Melatonin 10 MG TABS Take 10 mg by mouth at bedtime.     nitroGLYCERIN (NITROSTAT) 0.4 MG SL tablet Place 1 tablet (0.4 mg total) under the tongue every 5 (five) minutes as needed for chest pain. 25 tablet 3   pantoprazole (PROTONIX) 40 MG tablet Take 40 mg by mouth 2 (two) times daily before a meal.     simvastatin (ZOCOR) 40 MG tablet Take 40 mg by mouth at bedtime.     venlafaxine XR (EFFEXOR-XR) 75 MG 24 hr capsule Take 75 mg by mouth daily. Pt states taking one  half tablet 75 mg by mouth daily.     vitamin B-12 (CYANOCOBALAMIN) 1000 MCG tablet Take 1,000 mcg by mouth daily.     No current facility-administered medications for this visit.   Allergies:  Penicillins, Phenazopyridine hcl, and Ramipril   ROS: No orthopnea or PND.  Physical Exam: VS:  BP 120/64   Pulse 68   Ht '5\' 9"'$  (1.753 m)   Wt 137 lb 14.4 oz (62.6 kg)   SpO2 99%   BMI 20.36 kg/m , BMI Body mass index is 20.36 kg/m.  Wt Readings from Last 3 Encounters:  06/28/22 137 lb 14.4 oz (62.6 kg)  06/10/22 141 lb 1.6 oz (64 kg)  12/17/21 141 lb 12.8 oz (64.3 kg)    General: Patient appears comfortable at rest. HEENT: Conjunctiva and lids normal. Neck: Supple, no elevated JVP or carotid bruits. Lungs: Clear to auscultation, nonlabored breathing at  rest. Cardiac: Regular rate and rhythm, no S3 or significant systolic murmur. Extremities: No pitting edema.  ECG:  An ECG dated 12/17/2021 was personally reviewed today and demonstrated:  Atrial paced rhythm.  Recent Labwork: 06/10/2022: ALT 9; AST 15; BUN 13; Creatinine 0.95; Hemoglobin 11.7; Platelet Count 165; Potassium 4.0; Sodium 138  December 2023: Cholesterol 138, triglycerides 104, HDL 50, LDL 69, TSH 4.14  Other Studies Reviewed Today:  Lexiscan Myoview 12/05/2019: There was no ST segment deviation noted during stress. The study is normal. There are no perfusion defects consistent with prior infarct or current ischemia. This is a low risk study. The left ventricular ejection fraction is low normal (52%).   Echocardiogram 07/30/2020:  1. Left ventricular ejection fraction, by estimation, is 45 to 50%. The  left ventricle has low normal function. Left ventricular endocardial  border not optimally defined to evaluate regional wall motion. Left  ventricular diastolic parameters are  indeterminate. The average left ventricular global longitudinal strain is  -16.7 %.   2. Right ventricular systolic function is normal. The right ventricular  size is normal.   3. The mitral valve is normal in structure. Mild mitral valve  regurgitation. No evidence of mitral stenosis.   4. The aortic valve was not well visualized. There is mild calcification  of the aortic valve. There is mild thickening of the aortic valve. Aortic  valve regurgitation is mild to moderate. No aortic stenosis is present.   5. The inferior vena cava is normal in size with greater than 50%  respiratory variability, suggesting right atrial pressure of 3 mmHg.   Assessment and Plan:  1.  CAD status post DES to the proximal, mid, and distal RCA in 2006 with angioplasty of the mid and distal stent sites in 2016.  Will continue medical therapy and observation in the absence of increasing angina.  Last ischemic testing was  in 2021.  Continue aspirin, Plavix, Imdur at 15 mg daily, Zocor, and as needed nitroglycerin.  2.  Sinus node dysfunction with Medtronic pacemaker in place.  Recent device check indicated normal function.  3.  Very brief paroxysmal atrial fibrillation with low rhythm burden based on device interrogation.  CHA2DS2-VASc score is 5, have held off anticoagulation unless rhythm frequency increases.  Medication Adjustments/Labs and Tests Ordered: Current medicines are reviewed at length with the patient today.  Concerns regarding medicines are outlined above.   Tests Ordered: No orders of the defined types were placed in this encounter.   Medication Changes: Meds ordered this encounter  Medications   isosorbide mononitrate (IMDUR) 30 MG 24 hr tablet  Sig: Take 0.5 tablets (15 mg total) by mouth daily.    Dispense:  45 tablet    Refill:  3    06/28/22 decreased to 15 mg qd    Disposition:  Follow up  6 months.  Signed, Satira Sark, MD, [facility] 06/28/2022 9:08 AM    [facility] [place], [place], [place] [address] Phone: [phone]; Fax: [phone]

## 2022-06-28 ENCOUNTER — Encounter: Payer: Self-pay | Admitting: Cardiology

## 2022-06-28 ENCOUNTER — Ambulatory Visit: Payer: Medicare Other | Attending: Cardiology | Admitting: Cardiology

## 2022-06-28 VITALS — BP 120/64 | HR 68 | Ht 69.0 in | Wt 137.9 lb

## 2022-06-28 DIAGNOSIS — I495 Sick sinus syndrome: Secondary | ICD-10-CM

## 2022-06-28 DIAGNOSIS — I480 Paroxysmal atrial fibrillation: Secondary | ICD-10-CM | POA: Diagnosis not present

## 2022-06-28 DIAGNOSIS — I25119 Atherosclerotic heart disease of native coronary artery with unspecified angina pectoris: Secondary | ICD-10-CM | POA: Diagnosis not present

## 2022-06-28 MED ORDER — ISOSORBIDE MONONITRATE ER 30 MG PO TB24: 15.0000 mg | ORAL_TABLET | Freq: Every day | ORAL | 3 refills | Status: DC

## 2022-06-28 NOTE — Patient Instructions (Signed)
Medication Instructions:  DECREASE Imdur to 15 mg daily  Labwork: None today  Testing/Procedures: None today  Follow-Up: 6 months  Any Other Special Instructions Will Be Listed Below (If Applicable).  If you need a refill on your cardiac medications before your next appointment, please call your pharmacy.

## 2022-07-12 ENCOUNTER — Ambulatory Visit: Payer: Medicare Other

## 2022-07-12 DIAGNOSIS — I495 Sick sinus syndrome: Secondary | ICD-10-CM

## 2022-07-13 LAB — CUP PACEART REMOTE DEVICE CHECK
Battery Remaining Longevity: 143 mo
Battery Voltage: 3.02 V
Brady Statistic AP VP Percent: 0.04 %
Brady Statistic AP VS Percent: 71.12 %
Brady Statistic AS VP Percent: 0 %
Brady Statistic AS VS Percent: 28.84 %
Brady Statistic RA Percent Paced: 71.19 %
Brady Statistic RV Percent Paced: 0.04 %
Date Time Interrogation Session: 20240128204731
Implantable Lead Connection Status: 753985
Implantable Lead Connection Status: 753985
Implantable Lead Implant Date: 20220314
Implantable Lead Implant Date: 20220314
Implantable Lead Location: 753859
Implantable Lead Location: 753860
Implantable Lead Model: 5076
Implantable Lead Model: 5076
Implantable Pulse Generator Implant Date: 20211029
Lead Channel Impedance Value: 361 Ohm
Lead Channel Impedance Value: 380 Ohm
Lead Channel Impedance Value: 418 Ohm
Lead Channel Impedance Value: 437 Ohm
Lead Channel Pacing Threshold Amplitude: 0.5 V
Lead Channel Pacing Threshold Amplitude: 0.75 V
Lead Channel Pacing Threshold Pulse Width: 0.4 ms
Lead Channel Pacing Threshold Pulse Width: 0.4 ms
Lead Channel Sensing Intrinsic Amplitude: 11.5 mV
Lead Channel Sensing Intrinsic Amplitude: 11.5 mV
Lead Channel Sensing Intrinsic Amplitude: 2.75 mV
Lead Channel Sensing Intrinsic Amplitude: 2.75 mV
Lead Channel Setting Pacing Amplitude: 1.5 V
Lead Channel Setting Pacing Amplitude: 2 V
Lead Channel Setting Pacing Pulse Width: 0.4 ms
Lead Channel Setting Sensing Sensitivity: 1.2 mV
Zone Setting Status: 755011

## 2022-07-19 ENCOUNTER — Other Ambulatory Visit: Payer: Self-pay | Admitting: Oncology

## 2022-08-27 NOTE — Progress Notes (Signed)
Remote pacemaker transmission.   

## 2022-09-02 ENCOUNTER — Other Ambulatory Visit: Payer: Self-pay | Admitting: Cardiology

## 2022-10-11 ENCOUNTER — Ambulatory Visit (INDEPENDENT_AMBULATORY_CARE_PROVIDER_SITE_OTHER): Payer: Medicare Other

## 2022-10-11 DIAGNOSIS — I495 Sick sinus syndrome: Secondary | ICD-10-CM | POA: Diagnosis not present

## 2022-10-12 LAB — CUP PACEART REMOTE DEVICE CHECK
Battery Remaining Longevity: 140 mo
Battery Voltage: 3.01 V
Brady Statistic AP VP Percent: 0.03 %
Brady Statistic AP VS Percent: 71.05 %
Brady Statistic AS VP Percent: 0 %
Brady Statistic AS VS Percent: 28.92 %
Brady Statistic RA Percent Paced: 71.08 %
Brady Statistic RV Percent Paced: 0.03 %
Date Time Interrogation Session: 20240428220306
Implantable Lead Connection Status: 753985
Implantable Lead Connection Status: 753985
Implantable Lead Implant Date: 20220314
Implantable Lead Implant Date: 20220314
Implantable Lead Location: 753859
Implantable Lead Location: 753860
Implantable Lead Model: 5076
Implantable Lead Model: 5076
Implantable Pulse Generator Implant Date: 20211029
Lead Channel Impedance Value: 342 Ohm
Lead Channel Impedance Value: 361 Ohm
Lead Channel Impedance Value: 399 Ohm
Lead Channel Impedance Value: 418 Ohm
Lead Channel Pacing Threshold Amplitude: 0.5 V
Lead Channel Pacing Threshold Amplitude: 0.75 V
Lead Channel Pacing Threshold Pulse Width: 0.4 ms
Lead Channel Pacing Threshold Pulse Width: 0.4 ms
Lead Channel Sensing Intrinsic Amplitude: 2.875 mV
Lead Channel Sensing Intrinsic Amplitude: 2.875 mV
Lead Channel Sensing Intrinsic Amplitude: 6.625 mV
Lead Channel Sensing Intrinsic Amplitude: 6.625 mV
Lead Channel Setting Pacing Amplitude: 1.5 V
Lead Channel Setting Pacing Amplitude: 2 V
Lead Channel Setting Pacing Pulse Width: 0.4 ms
Lead Channel Setting Sensing Sensitivity: 1.2 mV
Zone Setting Status: 755011

## 2022-10-15 ENCOUNTER — Encounter: Payer: Medicare Other | Admitting: Internal Medicine

## 2022-10-15 ENCOUNTER — Ambulatory Visit: Payer: Medicare Other | Attending: Internal Medicine | Admitting: Cardiovascular Disease

## 2022-10-15 ENCOUNTER — Encounter: Payer: Self-pay | Admitting: Cardiovascular Disease

## 2022-10-15 VITALS — BP 130/70 | HR 80 | Ht 69.0 in | Wt 138.4 lb

## 2022-10-15 DIAGNOSIS — I495 Sick sinus syndrome: Secondary | ICD-10-CM

## 2022-10-15 LAB — CUP PACEART INCLINIC DEVICE CHECK
Battery Remaining Longevity: 140 mo
Battery Voltage: 3.01 V
Brady Statistic AP VP Percent: 0.03 %
Brady Statistic AP VS Percent: 72.28 %
Brady Statistic AS VP Percent: 0 %
Brady Statistic AS VS Percent: 27.68 %
Brady Statistic RA Percent Paced: 72.33 %
Brady Statistic RV Percent Paced: 0.03 %
Date Time Interrogation Session: 20240503101313
Implantable Lead Connection Status: 753985
Implantable Lead Connection Status: 753985
Implantable Lead Implant Date: 20220314
Implantable Lead Implant Date: 20220314
Implantable Lead Location: 753859
Implantable Lead Location: 753860
Implantable Lead Model: 5076
Implantable Lead Model: 5076
Implantable Pulse Generator Implant Date: 20211029
Lead Channel Impedance Value: 342 Ohm
Lead Channel Impedance Value: 361 Ohm
Lead Channel Impedance Value: 399 Ohm
Lead Channel Impedance Value: 437 Ohm
Lead Channel Pacing Threshold Amplitude: 0.5 V
Lead Channel Pacing Threshold Amplitude: 0.625 V
Lead Channel Pacing Threshold Pulse Width: 0.4 ms
Lead Channel Pacing Threshold Pulse Width: 0.4 ms
Lead Channel Sensing Intrinsic Amplitude: 2.125 mV
Lead Channel Sensing Intrinsic Amplitude: 2.875 mV
Lead Channel Sensing Intrinsic Amplitude: 7.875 mV
Lead Channel Sensing Intrinsic Amplitude: 8.75 mV
Lead Channel Setting Pacing Amplitude: 1.5 V
Lead Channel Setting Pacing Amplitude: 2 V
Lead Channel Setting Pacing Pulse Width: 0.4 ms
Lead Channel Setting Sensing Sensitivity: 1.2 mV
Zone Setting Status: 755011

## 2022-10-15 NOTE — Patient Instructions (Signed)
Medication Instructions:  Continue all current medications.  Labwork: none  Testing/Procedures: none  Follow-Up: 1 year - Dr.  Mealor   Any Other Special Instructions Will Be Listed Below (If Applicable).   If you need a refill on your cardiac medications before your next appointment, please call your pharmacy.; 

## 2022-10-15 NOTE — Progress Notes (Signed)
PCP: [facility]   Primary EP:  Dr Nelly Laurence  Russell Bonito Sr. is a 82 y.o. male who presents today for routine electrophysiology followup.  Since last being seen in our clinic, the patient reports doing very well.    He has a pacemaker for sinus node dysfunction.  The original left-sided device failed.  The leads were capped and a right-sided device placed.  He has had device detected atrial fibrillation and started on apixaban.  However, he cannot afford the medication and has not been interested in taking Coumadin.  Today, he denies symptoms of palpitations, chest pain, shortness of breath,  lower extremity edema, dizziness, presyncope, or syncope.  The patient is otherwise without complaint today.   Past Medical History:  Diagnosis Date   Anemia    CAD (coronary artery disease), native coronary artery    a. DES to proximal, mid, and distal RCA 2006. b. 12/2014 - [place] s/p  PTCA to the mid-distal previously placed RCA stents, otherwise nonobstructive CAD for continued medical therapy.   CHF (congestive heart failure) (HCC)    History of TIAs    Mixed hyperlipidemia    Pacemaker lead failure    RA lead insulation defect noted at Generator change by Dr Graciela Husbands 2008, impedance now low, but lead functioning otherwise normally   PAF (paroxysmal atrial fibrillation) [facility])    Prostate cancer metastatic to bone [facility])    Sinus node dysfunction [facility])    Medtronic pacemaker - Dr. Johney Frame   Past Surgical History:  Procedure Laterality Date   CARDIAC CATHETERIZATION N/A 01/06/2015   Procedure: Left Heart Cath and Coronary Angiography;  Surgeon: Lennette Bihari, MD;  Location: [facility] INVASIVE CV LAB;  Service: Cardiovascular;  Laterality: N/A;   LEAD REVISION/REPAIR N/A 08/25/2020   Procedure: LEAD REVISION/REPAIR;  Surgeon: Lanier Prude, MD;  Location: [facility] INVASIVE CV LAB;  Service: Cardiovascular;  Laterality: N/A;   PACEMAKER IMPLANT N/A 08/25/2020   Procedure: PACEMAKER IMPLANT;   Surgeon: Lanier Prude, MD;  Location: [facility] INVASIVE CV LAB;  Service: Cardiovascular;  Laterality: N/A;   PACEMAKER INSERTION     MDT   PPM GENERATOR CHANGEOUT N/A 04/11/2020   Procedure: PPM GENERATOR CHANGEOUT;  Surgeon: Duke Salvia, MD;  Location: [facility] INVASIVE CV LAB;  Service: Cardiovascular;  Laterality: N/A;    ROS- all systems are reviewed and negative except as per HPI above  Current Outpatient Medications  Medication Sig Dispense Refill   aspirin EC 81 MG EC tablet Take 1 tablet (81 mg total) by mouth daily. 30 tablet 11   Calcium Carbonate-Vitamin D (CALCIUM-VITAMIN D) 500-200 MG-UNIT per tablet Take 1 tablet by mouth daily.     clonazePAM (KLONOPIN) 1 MG tablet Take 2 tablets (2 mg total) by mouth at bedtime.     clopidogrel (PLAVIX) 75 MG tablet TAKE ONE TABLET BY MOUTH DAILY 90 tablet 3   fluticasone (FLONASE) 50 MCG/ACT nasal spray Place 1 spray into both nostrils 2 (two) times daily as needed.     guaiFENesin (MUCINEX) 600 MG 12 hr tablet Take 600 mg by mouth 2 (two) times daily as needed for cough or to loosen phlegm.     isosorbide mononitrate (IMDUR) 30 MG 24 hr tablet Take 0.5 tablets (15 mg total) by mouth daily. 45 tablet 1   leuprolide (LUPRON) 22.5 MG injection Inject 22.5 mg into the muscle every 3 (three) months.     levothyroxine (SYNTHROID, LEVOTHROID) 50 MCG tablet Take 50 mcg by  mouth daily before breakfast.      Melatonin 10 MG TABS Take 10 mg by mouth at bedtime.     nitroGLYCERIN (NITROSTAT) 0.4 MG SL tablet Place 1 tablet (0.4 mg total) under the tongue every 5 (five) minutes as needed for chest pain. (Patient taking differently: Place 0.4 mg under the tongue every 5 (five) minutes x 3 doses as needed for chest pain (if no relief after 3rd dose, proceed to ED or call 911).) 25 tablet 3   pantoprazole (PROTONIX) 40 MG tablet Take 40 mg by mouth 2 (two) times daily before a meal.     simvastatin (ZOCOR) 40 MG tablet Take 40 mg by mouth at bedtime.      venlafaxine (EFFEXOR) 37.5 MG tablet Take 37.5 mg by mouth daily.     vitamin B-12 (CYANOCOBALAMIN) 1000 MCG tablet Take 1,000 mcg by mouth daily.     No current facility-administered medications for this visit.    Physical Exam: Vitals:   10/15/22 0953  BP: 130/70  Pulse: 80  SpO2: 97%  Weight: 138 lb 6.4 oz (62.8 kg)  Height: 5\' 9"  (1.753 m)    Gen: Appears comfortable, well-nourished CV: RRR, no dependent edema The device site is normal -- no tenderness, edema, drainage, redness, threatened erosion. Pulm: breathing easily   Pacemaker interrogation- reviewed in detail today,  See PACEART report   Assessment and Plan:  1. Symptomatic sinus bradycardia  complete heart block Normal pacemaker function See Pace Art report No changes today he is not device dependant today  2. Paroxysmal atrial fibrillation No events detected on interrogation today I discussed the indication for anticoagulation with him again today. He has declined warfarin in the past. NOACs are too costly. He prefers to stay on ASA/plavix.   3. CAD Stable No change required today  4. HL Stable No change required today  Return in 12 months  Maurice Small, MD 10/15/2022 10:04 AM

## 2022-11-12 NOTE — Progress Notes (Signed)
Remote pacemaker transmission.   

## 2022-12-13 NOTE — Progress Notes (Unsigned)
    Cardiology Office Note  Date: 12/14/2022   ID: Russell Bonito Sr., DOB 1941-04-17, MRN [id number]  History of Present Illness: Russell Pamphile. is an 82 y.o. male last seen in January.  He is here for a routine visit.  Reports no progressive angina or sense of palpitations, no dizziness or syncope.  Medtronic pacemaker in place with follow-up by Dr. Nelly Laurence.  Device interrogation in May indicated normal function.  I reviewed his ECG today which shows sinus rhythm.  We went over his medications.  No spontaneous bleeding problems on aspirin and Plavix.  Otherwise continues on Imdur which she increased to 3 mg daily and Zocor.  Plan to refill fresh bottle of nitroglycerin.  His last LDL was 69 in December 2023.  Physical Exam: VS:  BP 138/78   Pulse 78   Ht 5\' 9"  (1.753 m)   Wt 136 lb 6.4 oz (61.9 kg)   SpO2 96%   BMI 20.14 kg/m , BMI Body mass index is 20.14 kg/m.  Wt Readings from Last 3 Encounters:  12/14/22 136 lb 6.4 oz (61.9 kg)  10/15/22 138 lb 6.4 oz (62.8 kg)  06/28/22 137 lb 14.4 oz (62.6 kg)    General: Patient appears comfortable at rest. HEENT: Conjunctiva and lids normal. Neck: Supple, no elevated JVP or carotid bruits. Lungs: Clear to auscultation, nonlabored breathing at rest. Cardiac: Regular rate and rhythm, no S3 or significant systolic murmur. Extremities: No pitting edema.  ECG:  An ECG dated 12/17/2021 was personally reviewed today and demonstrated:  Atrial paced rhythm.  Labwork: 06/10/2022: ALT 9; AST 15; BUN 13; Creatinine 0.95; Hemoglobin 11.7; Platelet Count 165; Potassium 4.0; Sodium 138  December 2023: Cholesterol 138, triglycerides 104, HDL 50, LDL 69, TSH 4.14  Other Studies Reviewed Today:  No interval cardiac testing for review today.  Assessment and Plan:  1.  CAD status post DES to the proximal/mid/distal RCA in 2006, angioplasty of the mid to distal RCA in 2016.  LVEF 45 to 50% by echocardiogram in February 2022.  We have continued dual  antiplatelet therapy, tolerating well.  No progressive angina reported.  Refill provided for fresh bottle of nitroglycerin.  Otherwise continue Imdur and Zocor.  2.  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 Nelly Laurence.  3.  History of brief paroxysmal atrial fibrillation with low rhythm burden based on device interrogations.  Anticoagulation has not been pursued as yet.  CHA2DS2-VASc score is 5.  He is in sinus rhythm today by ECG.  4.  Mixed hyperlipidemia, LDL 69 in December 2023.  Continue Zocor.  Disposition:  Follow up  6 months.  Signed, Jonelle Sidle, M.D., F.A.C.C. [facility]

## 2022-12-14 ENCOUNTER — Ambulatory Visit: Payer: Medicare Other | Attending: Cardiology | Admitting: Cardiology

## 2022-12-14 ENCOUNTER — Encounter: Payer: Self-pay | Admitting: Cardiology

## 2022-12-14 VITALS — BP 138/78 | HR 78 | Ht 69.0 in | Wt 136.4 lb

## 2022-12-14 DIAGNOSIS — Z950 Presence of cardiac pacemaker: Secondary | ICD-10-CM | POA: Diagnosis not present

## 2022-12-14 DIAGNOSIS — E782 Mixed hyperlipidemia: Secondary | ICD-10-CM | POA: Insufficient documentation

## 2022-12-14 DIAGNOSIS — I25119 Atherosclerotic heart disease of native coronary artery with unspecified angina pectoris: Secondary | ICD-10-CM | POA: Diagnosis present

## 2022-12-14 DIAGNOSIS — I480 Paroxysmal atrial fibrillation: Secondary | ICD-10-CM | POA: Insufficient documentation

## 2022-12-14 DIAGNOSIS — I495 Sick sinus syndrome: Secondary | ICD-10-CM | POA: Diagnosis not present

## 2022-12-14 MED ORDER — NITROGLYCERIN 0.4 MG SL SUBL: 0.4000 mg | SUBLINGUAL_TABLET | SUBLINGUAL | 3 refills | Status: DC | PRN

## 2022-12-14 NOTE — Patient Instructions (Addendum)

## 2022-12-27 NOTE — Progress Notes (Signed)
[facility] [address], [place] [address]   Clinic Day:  12/28/2022  Referring physician: [facility]  Patient Care Team: [facility] as PCP - General Jonelle Sidle, MD as PCP - Cardiology (Cardiology) Mealor, Roberts Gaudy, MD as PCP - Electrophysiology (Cardiology) Doreatha Massed, MD as Medical Oncologist (Medical Oncology)   ASSESSMENT & PLAN:   Assessment:  1.  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 - Rising PSA and documented bone mets in the right hip and right femur, s/p XRT directed to the right hip (30 Wallace Cullens in 12 fractions) concluded in 07/2011 for palliative purposes. - Lupron 22.5 mg started in 2013.  He has been receiving 3 months shot spread over every 6 months.  He was followed by Dr. Clelia Croft  2.  Social/family history: - Lives at home with his wife. 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  Non-smoker. - Father had lung cancer.  4 of his sisters had breast cancer.  Plan:  1.  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  His PSA has been very slowly rising up since 2020. - He has been receiving Lupron 22.5 mg injection every 6 months. - He does not report any new onset pains. - I have recommended that we check testosterone levels and PSA today along with routine labs. - I have recommended increasing the dose of Eligard to 45 mg every 6 months. - Recommend follow-up in 6 months with repeat labs and PSA.  If continues to have progression, will start him on ARPI.  Will consider reimaging at that time. - Given his diagnosis and extensive family history of breast cancer, have recommended genetic testing which will be drawn today.  2.   Hot flashes: - He reports hot flashes on and off.  This will likely worsen when we increase the dose of Eligard today.  He is currently taking Effexor  37.5 mg and does not think it is helping. - Will give him Megace 40 mg to be taken 1-2 times daily as needed for hot flashes. - After discussing with his PMD, he will try to wean off on Effexor.   Orders Placed This Encounter  Procedures   CBC with Differential    Standing Status:   Future    Number of Occurrences:   1    Standing Expiration Date:   12/28/2023   Comprehensive metabolic panel    Standing Status:   Future    Number of Occurrences:   1    Standing Expiration Date:   12/28/2023   PSA    Standing Status:   Future    Number of Occurrences:   1    Standing Expiration Date:   12/28/2023   Testosterone    Standing Status:   Future    Number of Occurrences:   1    Standing Expiration Date:   12/28/2023   Genetic Screening Order    Standing Status:   Future    Number of Occurrences:   1    Standing Expiration Date:   12/28/2023    Order Specific Question:   Release to patient    Answer:   Immediate  Alben Deeds Teague,acting as a Neurosurgeon for Doreatha Massed, MD.,have documented all relevant documentation on the behalf of Doreatha Massed, MD,as directed by  Doreatha Massed, MD while in the presence of Doreatha Massed, MD.   I, Doreatha Massed MD, have reviewed the above documentation for accuracy and completeness, and I agree with the above.   Doreatha Massed, MD   7/16/20244:44 PM  CHIEF COMPLAINT/PURPOSE OF CONSULT:   Diagnosis: Castration-sensitive advanced prostate cancer with disease to the bone   Current Therapy:  androgen deprivation therapy, Eliguard every 3 to 6 months  HISTORY OF PRESENT ILLNESS:   Russell Browning is a 82 y.o. male presenting to clinic today for evaluation of prostate cancer for transfer of care from Dr. Clelia Croft.  His last visit with Dr. Clelia Croft was on 06/10/22. It was noted  that the patient tolerated Eliguard without any issues and his PSA, though under control, was slowly rising. His CBC from that visit found abnormal RBC at 3.84, hemoglobin at 11.7, and HCT at 35.0.   Today, he states that he is doing well overall. His appetite level is at 100%. His energy level is at 100%.  PAST MEDICAL HISTORY:   Past Medical History: Past Medical History:  Diagnosis Date   Anemia    CAD (coronary artery disease), native coronary artery    a. DES to proximal, mid, and distal RCA 2006. b. 12/2014 - [place] s/p  PTCA to the mid-distal previously placed RCA stents, otherwise nonobstructive CAD for continued medical therapy.   CHF (congestive heart failure) (HCC)    History of TIAs    Mixed hyperlipidemia    Pacemaker lead failure    RA lead insulation defect noted at Generator change by Dr Graciela Husbands 2008, impedance now low, but lead functioning otherwise normally   PAF (paroxysmal atrial fibrillation) [facility])    Prostate cancer metastatic to bone [facility])    Sinus node dysfunction [facility])    Medtronic pacemaker - Dr. Johney Frame    Surgical History: Past Surgical History:  Procedure Laterality Date   CARDIAC CATHETERIZATION N/A 01/06/2015   Procedure: Left Heart Cath and Coronary Angiography;  Surgeon: Lennette Bihari, MD;  Location: [facility] INVASIVE CV LAB;  Service: Cardiovascular;  Laterality: N/A;   LEAD REVISION/REPAIR N/A 08/25/2020   Procedure: LEAD REVISION/REPAIR;  Surgeon: Lanier Prude, MD;  Location: [facility] INVASIVE CV LAB;  Service: Cardiovascular;  Laterality: N/A;   PACEMAKER IMPLANT N/A 08/25/2020   Procedure: PACEMAKER IMPLANT;  Surgeon: Lanier Prude, MD;  Location: [facility] INVASIVE CV LAB;  Service: Cardiovascular;  Laterality: N/A;   PACEMAKER INSERTION     MDT   PPM GENERATOR CHANGEOUT N/A 04/11/2020   Procedure: PPM GENERATOR CHANGEOUT;  Surgeon: Duke Salvia, MD;  [facility] INVASIVE CV LAB;  Service: Cardiovascular;  Laterality: N/A;    Social History: Social  History   Socioeconomic History   Marital status: Married    Spouse name: Not on file   Number of children: Not on file   Years of education: Not on file   Highest education level: Not on file  Occupational History   Not on file  Tobacco Use   Smoking status: Former    Current packs/day: 0.00    Average packs/day: 1 pack/day for 13.7 years (13.7 ttl pk-yrs)    Types: Cigarettes    Start date: 09/15/1951    Quit date: 06/14/1965    Years since quitting: 57.5   Smokeless tobacco: Former    Types: Tree surgeon  date: 06/14/1978   Tobacco comments:    chewed tobacco 5 years 1PPD  Vaping Use   Vaping status: Never Used  Substance and Sexual Activity   Alcohol use: No    Alcohol/week: 0.0 standard drinks of alcohol   Drug use: No   Sexual activity: Not on file  Other Topics Concern   Not on file  Social History Narrative   Not on file   Social Determinants of Health   Financial Resource Strain: Not on file  Food Insecurity: No Food Insecurity (12/28/2022)   Hunger Vital Sign    Worried About Running Out of Food in the Last Year: Never true    Ran Out of Food in the Last Year: Never true  Transportation Needs: No Transportation Needs (12/28/2022)   PRAPARE - Administrator, Civil Service (Medical): No    Lack of Transportation (Non-Medical): No  Physical Activity: Not on file  Stress: Not on file  Social Connections: Not on file  Intimate Partner Violence: Not At Risk (12/28/2022)   Humiliation, Afraid, Rape, and Kick questionnaire    Fear of Current or Ex-Partner: No    Emotionally Abused: No    Physically Abused: No    Sexually Abused: No    Family History: Family History  Problem Relation Age of Onset   Heart disease Sister    Heart disease Father     Current Medications:  Current Outpatient Medications:    aspirin EC 81 MG EC tablet, Take 1 tablet (81 mg total) by mouth daily., Disp: 30 tablet, Rfl: 11   Calcium Carbonate-Vitamin D (CALCIUM-VITAMIN D)  500-200 MG-UNIT per tablet, Take 1 tablet by mouth daily., Disp: , Rfl:    clonazePAM (KLONOPIN) 1 MG tablet, Take 2 tablets (2 mg total) by mouth at bedtime., Disp: , Rfl:    clopidogrel (PLAVIX) 75 MG tablet, TAKE ONE TABLET BY MOUTH DAILY, Disp: 90 tablet, Rfl: 3   fluticasone (FLONASE) 50 MCG/ACT nasal spray, Place 1 spray into both nostrils 2 (two) times daily as needed., Disp: , Rfl:    guaiFENesin (MUCINEX) 600 MG 12 hr tablet, Take 600 mg by mouth 2 (two) times daily as needed for cough or to loosen phlegm., Disp: , Rfl:    isosorbide mononitrate (IMDUR) 30 MG 24 hr tablet, Take 30 mg by mouth daily., Disp: , Rfl:    levothyroxine (SYNTHROID, LEVOTHROID) 50 MCG tablet, Take 50 mcg by mouth daily before breakfast. , Disp: , Rfl:    megestrol (MEGACE) 40 MG tablet, Take 1 tablet (40 mg total) by mouth 2 (two) times daily., Disp: 60 tablet, Rfl: 3   nitroGLYCERIN (NITROSTAT) 0.4 MG SL tablet, Place 1 tablet (0.4 mg total) under the tongue every 5 (five) minutes x 3 doses as needed for chest pain (if no relief after 3rd dose, proceed to ED or call 911)., Disp: 25 tablet, Rfl: 3   pantoprazole (PROTONIX) 40 MG tablet, Take 40 mg by mouth 2 (two) times daily before a meal., Disp: , Rfl:    simvastatin (ZOCOR) 40 MG tablet, Take 40 mg by mouth at bedtime., Disp: , Rfl:    venlafaxine (EFFEXOR) 37.5 MG tablet, Take 37.5 mg by mouth daily., Disp: , Rfl:    vitamin B-12 (CYANOCOBALAMIN) 1000 MCG tablet, Take 1,000 mcg by mouth daily., Disp: , Rfl:    Leuprolide Acetate, 6 Month, (LUPRON DEPOT, 75-MONTH,) 45 MG injection, Inject 45 mg into the muscle every 6 (six) months., Disp: , Rfl:  Allergies: Allergies  Allergen Reactions   Penicillins Other (See Comments)    Passed out   Phenazopyridine Hcl Hives   Ramipril Hives    REVIEW OF SYSTEMS:   Review of Systems  Constitutional:  Negative for chills, fatigue and fever.  HENT:   Negative for lump/mass, mouth sores, nosebleeds, sore throat and  trouble swallowing.   Eyes:  Negative for eye problems.  Respiratory:  Negative for cough and shortness of breath.   Cardiovascular:  Negative for chest pain, leg swelling and palpitations.  Gastrointestinal:  Positive for constipation. Negative for abdominal pain, diarrhea, nausea and vomiting.  Endocrine: Positive for hot flashes.  Genitourinary:  Negative for bladder incontinence, difficulty urinating, dysuria, frequency, hematuria and nocturia.   Musculoskeletal:  Negative for arthralgias, back pain, flank pain, myalgias and neck pain.  Skin:  Negative for itching and rash.  Neurological:  Negative for dizziness, headaches and numbness.  Hematological:  Does not bruise/bleed easily.  Psychiatric/Behavioral:  Negative for depression, sleep disturbance and suicidal ideas. The patient is not nervous/anxious.   All other systems reviewed and are negative.    VITALS:   Blood pressure 132/86, pulse 79, temperature (!) 97.5 F (36.4 C), resp. rate 18, height 5' 7.5" (1.715 m), weight 135 lb 4.8 oz (61.4 kg), SpO2 98%.  Wt Readings from Last 3 Encounters:  12/28/22 135 lb 4.8 oz (61.4 kg)  12/14/22 136 lb 6.4 oz (61.9 kg)  10/15/22 138 lb 6.4 oz (62.8 kg)    Body mass index is 20.88 kg/m.   PHYSICAL EXAM:   Physical Exam Vitals and nursing note reviewed. Exam conducted with a chaperone present.  Constitutional:      Appearance: Normal appearance.  Cardiovascular:     Rate and Rhythm: Normal rate and regular rhythm.     Pulses: Normal pulses.     Heart sounds: Normal heart sounds.  Pulmonary:     Effort: Pulmonary effort is normal.     Breath sounds: Normal breath sounds.  Abdominal:     Palpations: Abdomen is soft. There is no hepatomegaly, splenomegaly or mass.     Tenderness: There is no abdominal tenderness.  Musculoskeletal:     Right lower leg: No edema.     Left lower leg: No edema.  Lymphadenopathy:     Cervical: No cervical adenopathy.     Right cervical: No  superficial, deep or posterior cervical adenopathy.    Left cervical: No superficial, deep or posterior cervical adenopathy.     Upper Body:     Right upper body: No supraclavicular or axillary adenopathy.     Left upper body: No supraclavicular or axillary adenopathy.  Neurological:     General: No focal deficit present.     Mental Status: He is alert and oriented to person, place, and time.  Psychiatric:        Mood and Affect: Mood normal.        Behavior: Behavior normal.     LABS:      Latest Ref Rng & Units 12/28/2022    2:00 PM 06/10/2022   10:17 AM 12/10/2021    8:02 AM  CBC  WBC 4.0 - 10.5 K/uL 6.9  5.5  3.9   Hemoglobin 13.0 - 17.0 g/dL 16.1  09.6  04.5   Hematocrit 39.0 - 52.0 % 37.1  35.0  32.9   Platelets 150 - 400 K/uL 210  165  162       Latest Ref Rng & Units 12/28/2022  2:00 PM 06/10/2022   10:17 AM 12/10/2021    8:02 AM  CMP  Glucose 70 - 99 mg/dL 92  88  90   BUN 8 - 23 mg/dL 15  13  21    Creatinine 0.61 - 1.24 mg/dL 5.40  9.81  1.91   Sodium 135 - 145 mmol/L 134  138  137   Potassium 3.5 - 5.1 mmol/L 4.3  4.0  4.1   Chloride 98 - 111 mmol/L 99  104  103   CO2 22 - 32 mmol/L 27  30  29    Calcium 8.9 - 10.3 mg/dL 9.4  9.4  9.4   Total Protein 6.5 - 8.1 g/dL 7.2  6.5  6.4   Total Bilirubin 0.3 - 1.2 mg/dL 0.7  0.3  0.5   Alkaline Phos 38 - 126 U/L 26  23  23    AST 15 - 41 U/L 18  15  19    ALT 0 - 44 U/L 12  9  11       No results found for: "CEA1", "CEA" / No results found for: "CEA1", "CEA" Lab Results  Component Value Date   PSA1 1.0 06/10/2022   No results found for: "YNW295" No results found for: "CAN125"  No results found for: "TOTALPROTELP", "ALBUMINELP", "A1GS", "A2GS", "BETS", "BETA2SER", "GAMS", "MSPIKE", "SPEI" No results found for: "TIBC", "FERRITIN", "IRONPCTSAT" No results found for: "LDH"   STUDIES:   No results found.

## 2022-12-28 ENCOUNTER — Encounter: Payer: Self-pay | Admitting: Hematology

## 2022-12-28 ENCOUNTER — Inpatient Hospital Stay: Payer: Medicare Other

## 2022-12-28 ENCOUNTER — Inpatient Hospital Stay: Payer: Medicare Other | Attending: Hematology | Admitting: Hematology

## 2022-12-28 VITALS — BP 132/86 | HR 79 | Temp 97.5°F | Resp 18 | Ht 67.5 in | Wt 135.3 lb

## 2022-12-28 DIAGNOSIS — Z79899 Other long term (current) drug therapy: Secondary | ICD-10-CM | POA: Insufficient documentation

## 2022-12-28 DIAGNOSIS — Z803 Family history of malignant neoplasm of breast: Secondary | ICD-10-CM

## 2022-12-28 DIAGNOSIS — Z87891 Personal history of nicotine dependence: Secondary | ICD-10-CM | POA: Diagnosis not present

## 2022-12-28 DIAGNOSIS — Z801 Family history of malignant neoplasm of trachea, bronchus and lung: Secondary | ICD-10-CM

## 2022-12-28 DIAGNOSIS — C7951 Secondary malignant neoplasm of bone: Secondary | ICD-10-CM | POA: Diagnosis present

## 2022-12-28 DIAGNOSIS — R232 Flushing: Secondary | ICD-10-CM

## 2022-12-28 DIAGNOSIS — C61 Malignant neoplasm of prostate: Secondary | ICD-10-CM

## 2022-12-28 LAB — CBC WITH DIFFERENTIAL/PLATELET
Abs Immature Granulocytes: 0.02 10*3/uL (ref 0.00–0.07)
Basophils Absolute: 0 10*3/uL (ref 0.0–0.1)
Basophils Relative: 0 %
Eosinophils Absolute: 0.2 10*3/uL (ref 0.0–0.5)
Eosinophils Relative: 2 %
HCT: 37.1 % — ABNORMAL LOW (ref 39.0–52.0)
Hemoglobin: 12.3 g/dL — ABNORMAL LOW (ref 13.0–17.0)
Immature Granulocytes: 0 %
Lymphocytes Relative: 27 %
Lymphs Abs: 1.9 10*3/uL (ref 0.7–4.0)
MCH: 30.6 pg (ref 26.0–34.0)
MCHC: 33.2 g/dL (ref 30.0–36.0)
MCV: 92.3 fL (ref 80.0–100.0)
Monocytes Absolute: 0.6 10*3/uL (ref 0.1–1.0)
Monocytes Relative: 8 %
Neutro Abs: 4.3 10*3/uL (ref 1.7–7.7)
Neutrophils Relative %: 63 %
Platelets: 210 10*3/uL (ref 150–400)
RBC: 4.02 MIL/uL — ABNORMAL LOW (ref 4.22–5.81)
RDW: 13 % (ref 11.5–15.5)
WBC: 6.9 10*3/uL (ref 4.0–10.5)
nRBC: 0 % (ref 0.0–0.2)

## 2022-12-28 LAB — COMPREHENSIVE METABOLIC PANEL
ALT: 12 U/L (ref 0–44)
AST: 18 U/L (ref 15–41)
Albumin: 4.3 g/dL (ref 3.5–5.0)
Alkaline Phosphatase: 26 U/L — ABNORMAL LOW (ref 38–126)
Anion gap: 8 (ref 5–15)
BUN: 15 mg/dL (ref 8–23)
CO2: 27 mmol/L (ref 22–32)
Calcium: 9.4 mg/dL (ref 8.9–10.3)
Chloride: 99 mmol/L (ref 98–111)
Creatinine, Ser: 1.12 mg/dL (ref 0.61–1.24)
GFR, Estimated: >60 mL/min (ref >60)
Glucose, Bld: 92 mg/dL (ref 70–99)
Potassium: 4.3 mmol/L (ref 3.5–5.1)
Sodium: 134 mmol/L — ABNORMAL LOW (ref 135–145)
Total Bilirubin: 0.7 mg/dL (ref 0.3–1.2)
Total Protein: 7.2 g/dL (ref 6.5–8.1)

## 2022-12-28 LAB — PSA: Prostatic Specific Antigen: 1.55 ng/mL (ref 0.00–4.00)

## 2022-12-28 LAB — GENETIC SCREENING ORDER

## 2022-12-28 MED ORDER — LEUPROLIDE ACETATE (6 MONTH) 45 MG IM KIT: 45.0000 mg | PACK | Freq: Once | INTRAMUSCULAR | Status: DC

## 2022-12-28 MED ORDER — LEUPROLIDE ACETATE (6 MONTH) 45 MG ~~LOC~~ KIT
45.0000 mg | PACK | Freq: Once | SUBCUTANEOUS | Status: AC
  Administered 2022-12-28: 45 mg via SUBCUTANEOUS
  Filled 2022-12-28: qty 45

## 2022-12-28 MED ORDER — MEGESTROL ACETATE 40 MG PO TABS: 40.0000 mg | ORAL_TABLET | Freq: Two times a day (BID) | ORAL | 3 refills | Status: DC

## 2022-12-28 NOTE — Patient Instructions (Signed)
[facility]  Discharge Instructions: Thank you for choosing [facility] to provide your oncology and hematology care.  If you have a lab appointment with the Cancer Center - please note that after April 8th, 2024, all labs will be drawn in the cancer center.  You do not have to check in or register with the main entrance as you have in the past but will complete your check-in in the cancer center.  Wear comfortable clothing and clothing appropriate for easy access to any Portacath or PICC line.   We strive to give you quality time with your provider. You may need to reschedule your appointment if you arrive late (15 or more minutes).  Arriving late affects you and other patients whose appointments are after yours.  Also, if you miss three or more appointments without notifying the office, you may be dismissed from the clinic at the provider's discretion.      For prescription refill requests, have your pharmacy contact our office and allow 72 hours for refills to be completed.    Today you received the following chemotherapy and/or immunotherapy agents Eligard.\  Leuprolide Suspension for Injection (Prostate Cancer) What is this medication? LEUPROLIDE (loo PROE lide) reduces the symptoms of prostate cancer. It works by decreasing levels of the hormone testosterone in the body. This prevents prostate cancer cells from spreading or growing. This medicine may be used for other purposes; ask your health care provider or pharmacist if you have questions. COMMON BRAND NAME(S): Eligard, Lupron Depot, Lupron Depot-Ped, Lutrate Depot, Viadur What should I tell my care team before I take this medication? They need to know if you have any of these conditions: Diabetes Heart disease Heart failure High or low levels of electrolytes, such as magnesium, potassium, or sodium in your blood Irregular heartbeat or rhythm Seizures An unusual or allergic reaction to  leuprolide, other medications, foods, dyes, or preservatives Pregnant or trying to get pregnant Breast-feeding How should I use this medication? This medication is injected under the skin or into a muscle. It is given by your care team in a hospital or clinic setting. Talk to your care team about the use of this medication in children. Special care may be needed. Overdosage: If you think you have taken too much of this medicine contact a poison control center or emergency room at once. NOTE: This medicine is only for you. Do not share this medicine with others. What if I miss a dose? Keep appointments for follow-up doses. It is important not to miss your dose. Call your care team if you are unable to keep an appointment. What may interact with this medication? Do not take this medication with any of the following: Cisapride Dronedarone Ketoconazole Levoketoconazole Pimozide Thioridazine This medication may also interact with the following: Other medications that cause heart rhythm changes This list may not describe all possible interactions. Give your health care provider a list of all the medicines, herbs, non-prescription drugs, or dietary supplements you use. Also tell them if you smoke, drink alcohol, or use illegal drugs. Some items may interact with your medicine. What should I watch for while using this medication? Visit your care team for regular checks on your progress. Tell your care team if your symptoms do not start to get better or if they get worse. This medication may increase blood sugar. The risk may be higher in patients who already have diabetes. Ask your care team what you can do to lower  the risk of diabetes while taking this medication. This medication may cause infertility. Talk to your care team if you are concerned about your fertility. Heart attacks and strokes have been reported with the use of this medication. Get emergency help if you develop signs or symptoms of  a heart attack or stroke. Talk to your care team about the risks and benefits of this medication. What side effects may I notice from receiving this medication? Side effects that you should report to your care team as soon as possible: Allergic reactions--skin rash, itching, hives, swelling of the face, lips, tongue, or throat Heart attack--pain or tightness in the chest, shoulders, arms, or jaw, nausea, shortness of breath, cold or clammy skin, feeling faint or lightheaded Heart rhythm changes--fast or irregular heartbeat, dizziness, feeling faint or lightheaded, chest pain, trouble breathing High blood sugar (hyperglycemia)--increased thirst or amount of urine, unusual weakness or fatigue, blurry vision Mood swings, irritability, hostility Seizures Stroke--sudden numbness or weakness of the face, arm, or leg, trouble speaking, confusion, trouble walking, loss of balance or coordination, dizziness, severe headache, change in vision Thoughts of suicide or self-harm, worsening mood, feelings of depression Side effects that usually do not require medical attention (report to your care team if they continue or are bothersome): Bone pain Change in sex drive or performance General discomfort and fatigue Hot flashes Muscle pain Pain, redness, or irritation at injection site Swelling of the ankles, hands, or feet This list may not describe all possible side effects. Call your doctor for medical advice about side effects. You may report side effects to FDA at 1-800-FDA-1088. Where should I keep my medication? This medication is given in a hospital or clinic. It will not be stored at home. NOTE: This sheet is a summary. It may not cover all possible information. If you have questions about this medicine, talk to your doctor, pharmacist, or health care provider.  2024 Elsevier/Gold Standard (2021-08-12 00:00:00)       To help prevent nausea and vomiting after your treatment, we encourage you to take  your nausea medication as directed.  BELOW ARE SYMPTOMS THAT SHOULD BE REPORTED IMMEDIATELY: *FEVER GREATER THAN 100.4 F (38 C) OR HIGHER *CHILLS OR SWEATING *NAUSEA AND VOMITING THAT IS NOT CONTROLLED WITH YOUR NAUSEA MEDICATION *UNUSUAL SHORTNESS OF BREATH *UNUSUAL BRUISING OR BLEEDING *URINARY PROBLEMS (pain or burning when urinating, or frequent urination) *BOWEL PROBLEMS (unusual diarrhea, constipation, pain near the anus) TENDERNESS IN MOUTH AND THROAT WITH OR WITHOUT PRESENCE OF ULCERS (sore throat, sores in mouth, or a toothache) UNUSUAL RASH, SWELLING OR PAIN  UNUSUAL VAGINAL DISCHARGE OR ITCHING   Items with * indicate a potential emergency and should be followed up as soon as possible or go to the Emergency Department if any problems should occur.  Please show the CHEMOTHERAPY ALERT CARD or IMMUNOTHERAPY ALERT CARD at check-in to the Emergency Department and triage nurse.  Should you have questions after your visit or need to cancel or reschedule your appointment, please contact [facility] [phone]  and follow the prompts.  Office hours are 8:00 a.m. to 4:30 p.m. Monday - Friday. Please note that voicemails left after 4:00 p.m. may not be returned until the following business day.  We are closed weekends and major holidays. You have access to a nurse at all times for urgent questions. Please call the main number to the clinic [phone] and follow the prompts.  For any non-urgent questions, you may also contact your provider using MyChart.  We now offer e-Visits for anyone 77 and older to request care online for non-urgent symptoms. For details visit [email or web address].   Also download the MyChart app! Go to the app store, search "MyChart", open the app, select [facility], and log in with your MyChart username and password.

## 2022-12-28 NOTE — Progress Notes (Signed)
Orders received to change to Eligard 45 mg (6 month dosing).  Plan entered.  V.O. Dr Carilyn Goodpasture, PharmD

## 2022-12-28 NOTE — Patient Instructions (Addendum)
[facility]  Discharge Instructions  You were seen and examined today by Dr. Ellin Saba today for ongoing management of your prostate cancer.   Your PSA has been increasing. We will check this again today. If it has increased further, Dr. Ellin Saba has recommended an increase dose of Lupron. Right now, you are taking the 3 month dose every 6 months.  Follow-up as scheduled.  Thank you for choosing [facility] to provide your oncology and hematology care.   To afford each patient quality time with our provider, please arrive at least 15 minutes before your scheduled appointment time. You may need to reschedule your appointment if you arrive late (10 or more minutes). Arriving late affects you and other patients whose appointments are after yours.  Also, if you miss three or more appointments without notifying the office, you may be dismissed from the clinic at the provider's discretion.    Again, thank you for choosing [facility].  Our hope is that these requests will decrease the amount of time that you wait before being seen by our physicians.   If you have a lab appointment with the Cancer Center - please note that after April 8th, all labs will be drawn in the cancer center.  You do not have to check in or register with the main entrance as you have in the past but will complete your check-in at the cancer center.            _____________________________________________________________  Should you have questions after your visit to [facility], please contact our office at [phone] and follow the prompts.  Our office hours are 8:00 a.m. to 4:30 p.m. Monday - Thursday and 8:00 a.m. to 2:30 p.m. Friday.  Please note that voicemails left after 4:00 p.m. may not be returned until the following business day.  We are closed weekends and all major holidays.  You do have access to a nurse 24-7, just call the main  number to the clinic [phone] and do not press any options, hold on the line and a nurse will answer the phone.    For prescription refill requests, have your pharmacy contact our office and allow 72 hours.    Masks are no longer required in the cancer centers. If you would like for your care team to wear a mask while they are taking care of you, please let them know. You may have one support person who is at least 81 years old accompany you for your appointments.

## 2022-12-28 NOTE — Progress Notes (Signed)
Patient tolerated injection with no complaints voiced.  Site clean and dry with no bruising or swelling noted at site.  See MAR for details.  Band aid applied.  Patient stable during and after injection.  Vss with discharge and left in satisfactory condition with no s/s of distress noted.  

## 2022-12-29 LAB — TESTOSTERONE: Testosterone: <3 ng/dL — ABNORMAL LOW (ref 264–916)

## 2023-01-10 ENCOUNTER — Encounter: Payer: Self-pay | Admitting: Licensed Clinical Social Worker

## 2023-01-10 ENCOUNTER — Ambulatory Visit (INDEPENDENT_AMBULATORY_CARE_PROVIDER_SITE_OTHER): Payer: Medicare Other

## 2023-01-10 DIAGNOSIS — I495 Sick sinus syndrome: Secondary | ICD-10-CM | POA: Diagnosis not present

## 2023-01-10 DIAGNOSIS — Z1379 Encounter for other screening for genetic and chromosomal anomalies: Secondary | ICD-10-CM | POA: Insufficient documentation

## 2023-01-27 NOTE — Progress Notes (Signed)
Remote pacemaker transmission.   

## 2023-02-25 ENCOUNTER — Other Ambulatory Visit: Payer: Self-pay | Admitting: *Deleted

## 2023-02-25 MED ORDER — ISOSORBIDE MONONITRATE ER 30 MG PO TB24: 30.0000 mg | ORAL_TABLET | Freq: Every day | ORAL | 1 refills | Status: DC

## 2023-02-25 MED ORDER — NITROGLYCERIN 0.4 MG SL SUBL: 0.4000 mg | SUBLINGUAL_TABLET | SUBLINGUAL | 3 refills | Status: AC | PRN

## 2023-04-11 ENCOUNTER — Ambulatory Visit (INDEPENDENT_AMBULATORY_CARE_PROVIDER_SITE_OTHER): Payer: Medicare Other

## 2023-04-11 DIAGNOSIS — I428 Other cardiomyopathies: Secondary | ICD-10-CM

## 2023-04-11 DIAGNOSIS — I495 Sick sinus syndrome: Secondary | ICD-10-CM

## 2023-04-11 LAB — CUP PACEART REMOTE DEVICE CHECK
Battery Remaining Longevity: 133 mo
Battery Voltage: 3.01 V
Brady Statistic AP VP Percent: 0.17 %
Brady Statistic AP VS Percent: 58.72 %
Brady Statistic AS VP Percent: 0.01 %
Brady Statistic AS VS Percent: 41.1 %
Brady Statistic RA Percent Paced: 58.82 %
Brady Statistic RV Percent Paced: 0.18 %
Date Time Interrogation Session: 20241027202044
Implantable Lead Connection Status: 753985
Implantable Lead Connection Status: 753985
Implantable Lead Implant Date: 20220314
Implantable Lead Implant Date: 20220314
Implantable Lead Location: 753859
Implantable Lead Location: 753860
Implantable Lead Model: 5076
Implantable Lead Model: 5076
Implantable Pulse Generator Implant Date: 20211029
Lead Channel Impedance Value: 304 Ohm
Lead Channel Impedance Value: 323 Ohm
Lead Channel Impedance Value: 361 Ohm
Lead Channel Impedance Value: 380 Ohm
Lead Channel Pacing Threshold Amplitude: 0.5 V
Lead Channel Pacing Threshold Amplitude: 0.875 V
Lead Channel Pacing Threshold Pulse Width: 0.4 ms
Lead Channel Pacing Threshold Pulse Width: 0.4 ms
Lead Channel Sensing Intrinsic Amplitude: 2.625 mV
Lead Channel Sensing Intrinsic Amplitude: 2.625 mV
Lead Channel Sensing Intrinsic Amplitude: 5.375 mV
Lead Channel Sensing Intrinsic Amplitude: 5.375 mV
Lead Channel Setting Pacing Amplitude: 1.5 V
Lead Channel Setting Pacing Amplitude: 2 V
Lead Channel Setting Pacing Pulse Width: 0.4 ms
Lead Channel Setting Sensing Sensitivity: 1.2 mV
Zone Setting Status: 755011

## 2023-05-02 NOTE — Progress Notes (Signed)
Remote pacemaker transmission.   

## 2023-05-20 ENCOUNTER — Other Ambulatory Visit: Payer: Self-pay | Admitting: Hematology

## 2023-05-23 ENCOUNTER — Encounter: Payer: Self-pay | Admitting: Hematology

## 2023-05-27 ENCOUNTER — Other Ambulatory Visit: Payer: Self-pay | Admitting: Cardiology

## 2023-05-27 MED ORDER — CLOPIDOGREL BISULFATE 75 MG PO TABS: 75.0000 mg | ORAL_TABLET | Freq: Every day | ORAL | 1 refills | Status: DC

## 2023-06-06 ENCOUNTER — Encounter: Payer: Self-pay | Admitting: Hematology

## 2023-06-13 ENCOUNTER — Telehealth: Payer: Self-pay | Admitting: Cardiovascular Disease

## 2023-06-13 NOTE — Telephone Encounter (Signed)
Pt came in office and stated his home monitoring device is not working and wants to know what he can do. Russell Browning stated his heart rate dropped in the 50's several time at home as well as his BP dropped. [phone] best number to reach pt.

## 2023-06-16 NOTE — Telephone Encounter (Signed)
 Attempted to contact patient, left message to call our office back

## 2023-06-21 ENCOUNTER — Encounter: Payer: Self-pay | Admitting: Cardiology

## 2023-06-21 ENCOUNTER — Ambulatory Visit: Payer: Medicaid Other | Attending: Cardiology | Admitting: Cardiology

## 2023-06-21 VITALS — BP 134/64 | HR 72 | Ht 69.0 in | Wt 139.0 lb

## 2023-06-21 DIAGNOSIS — I25119 Atherosclerotic heart disease of native coronary artery with unspecified angina pectoris: Secondary | ICD-10-CM | POA: Diagnosis not present

## 2023-06-21 DIAGNOSIS — Z950 Presence of cardiac pacemaker: Secondary | ICD-10-CM | POA: Diagnosis not present

## 2023-06-21 DIAGNOSIS — I480 Paroxysmal atrial fibrillation: Secondary | ICD-10-CM | POA: Diagnosis not present

## 2023-06-21 DIAGNOSIS — I495 Sick sinus syndrome: Secondary | ICD-10-CM | POA: Diagnosis not present

## 2023-06-21 DIAGNOSIS — E782 Mixed hyperlipidemia: Secondary | ICD-10-CM

## 2023-06-21 MED ORDER — MIDODRINE HCL 2.5 MG PO TABS: 2.5000 mg | ORAL_TABLET | Freq: Two times a day (BID) | ORAL | 6 refills | Status: AC

## 2023-06-21 NOTE — Patient Instructions (Addendum)
 Medication Instructions:  Your physician has recommended you make the following change in your medication:  Start midodrine  2.5 mg twice daily  Continue all other medications as prescribed  Labwork: none  Testing/Procedures: none  Follow-Up: Your physician recommends that you schedule a follow-up appointment in: 6 months  Any Other Special Instructions Will Be Listed Below (If Applicable). We will send a message to our device clinic to contact you about your home equipment wire  If you need a refill on your cardiac medications before your next appointment, please call your pharmacy.

## 2023-06-21 NOTE — Progress Notes (Signed)
    Cardiology Office Note  Date: 06/21/2023   ID: Sherida DELENA Sharps Sr., DOB 11-Oct-1940, MRN [id number]  History of Present Illness: Russell Browning. is an 83 y.o. male last seen in July 2024.  He is here today with his granddaughter for a follow-up visit.  He complains of fatigue, orthostatic lightheadedness, no definite angina or palpitations.  I went over his home blood pressure and heart rate checks.  He has had relatively low systolics at times, although not under 100.  He is not on antihypertensive therapy.  Medtronic pacemaker in place with follow-up by Dr. Nancey.  Device check in October 2024 revealed normal function.  States that he is having trouble with his home monitoring equipment, it may be that the cord is frayed.  I reviewed his medications.  Current cardiovascular regimen includes aspirin , Plavix , Imdur , and Zocor .  Physical Exam: VS:  BP 134/64   Pulse 72   Ht 5' 9 (1.753 m)   Wt 139 lb (63 kg)   SpO2 94%   BMI 20.53 kg/m , BMI Body mass index is 20.53 kg/m.  Wt Readings from Last 3 Encounters:  06/21/23 139 lb (63 kg)  12/28/22 135 lb 4.8 oz (61.4 kg)  12/14/22 136 lb 6.4 oz (61.9 kg)    General: Patient appears comfortable at rest. HEENT: Conjunctiva and lids normal. Lungs: Clear to auscultation, nonlabored breathing at rest. Cardiac: Regular rate and rhythm, no S3 or significant systolic murmur. Extremities: No pitting edema.  ECG:  An ECG dated 12/14/2022 was personally reviewed today and demonstrated:  Sinus rhythm.  Labwork: 12/28/2022: ALT 12; AST 18; BUN 15; Creatinine, Ser 1.12; Hemoglobin 12.3; Platelets 210; Potassium 4.3; Sodium 134   Other Studies Reviewed Today:  No interval cardiac testing for review today.  Assessment and Plan:  1.  CAD status post DES to the proximal/mid/distal RCA in 2006, angioplasty of the mid to distal RCA in 2016.  LVEF 45 to 50% by echocardiogram in February 2022.  He does not report any angina at this time.  Plan to  continue aspirin , Plavix , Imdur , and Zocor .  2.  Low normal blood pressures and orthostasis, no syncope.  Reports fatigue as well.  We will trial midodrine  2.5 mg twice daily.   3.  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 Nancey.  Will have device clinic reach out to him regarding his home monitoring equipment, keep follow-up check in later January.   4.  History of brief paroxysmal atrial fibrillation with low rhythm burden based on device interrogations.  Anticoagulation has not been pursued as yet.  CHA2DS2-VASc score is 5.  Follow-up with next device check.   5.  Mixed hyperlipidemia.  Continue Zocor .  Disposition:  Follow up  6 months.  Signed, Jayson JUDITHANN Sierras, M.D., F.A.C.C. [facility] at [facility]

## 2023-06-23 ENCOUNTER — Telehealth: Payer: Self-pay

## 2023-06-23 NOTE — Telephone Encounter (Signed)
 Outreach made to Pt.   Advised a power cord had been obtained and will leave for Pt/wife to pick up at the [facility] office tomorrow.  Advised they can pick up at their leisure.  Wife thanked nurse for care.

## 2023-06-23 NOTE — Telephone Encounter (Signed)
 I tried to get the pt a new power cord order by call [employer] tech support but was unsuccessful. We are asking a rep to send the pt a new cord.

## 2023-06-29 ENCOUNTER — Other Ambulatory Visit: Payer: Self-pay

## 2023-06-29 DIAGNOSIS — C61 Malignant neoplasm of prostate: Secondary | ICD-10-CM

## 2023-06-30 ENCOUNTER — Inpatient Hospital Stay: Payer: Medicaid Other | Attending: Hematology

## 2023-06-30 ENCOUNTER — Inpatient Hospital Stay: Payer: Medicare Other

## 2023-06-30 VITALS — BP 120/70 | HR 77 | Temp 98.6°F | Resp 16

## 2023-06-30 DIAGNOSIS — C61 Malignant neoplasm of prostate: Secondary | ICD-10-CM

## 2023-06-30 DIAGNOSIS — C7951 Secondary malignant neoplasm of bone: Secondary | ICD-10-CM | POA: Diagnosis present

## 2023-06-30 DIAGNOSIS — Z5111 Encounter for antineoplastic chemotherapy: Secondary | ICD-10-CM | POA: Diagnosis present

## 2023-06-30 LAB — COMPREHENSIVE METABOLIC PANEL
ALT: 12 U/L (ref 0–44)
AST: 17 U/L (ref 15–41)
Albumin: 4.3 g/dL (ref 3.5–5.0)
Alkaline Phosphatase: 17 U/L — ABNORMAL LOW (ref 38–126)
Anion gap: 8 (ref 5–15)
BUN: 15 mg/dL (ref 8–23)
CO2: 23 mmol/L (ref 22–32)
Calcium: 9.4 mg/dL (ref 8.9–10.3)
Chloride: 105 mmol/L (ref 98–111)
Creatinine, Ser: 1.15 mg/dL (ref 0.61–1.24)
GFR, Estimated: >60 mL/min (ref >60)
Glucose, Bld: 87 mg/dL (ref 70–99)
Potassium: 4.4 mmol/L (ref 3.5–5.1)
Sodium: 136 mmol/L (ref 135–145)
Total Bilirubin: 0.5 mg/dL (ref 0.0–1.2)
Total Protein: 6.9 g/dL (ref 6.5–8.1)

## 2023-06-30 LAB — CBC WITH DIFFERENTIAL/PLATELET
Abs Immature Granulocytes: 0.02 10*3/uL (ref 0.00–0.07)
Basophils Absolute: 0 10*3/uL (ref 0.0–0.1)
Basophils Relative: 1 %
Eosinophils Absolute: 0.2 10*3/uL (ref 0.0–0.5)
Eosinophils Relative: 3 %
HCT: 34 % — ABNORMAL LOW (ref 39.0–52.0)
Hemoglobin: 11.5 g/dL — ABNORMAL LOW (ref 13.0–17.0)
Immature Granulocytes: 0 %
Lymphocytes Relative: 32 %
Lymphs Abs: 2.3 10*3/uL (ref 0.7–4.0)
MCH: 31.5 pg (ref 26.0–34.0)
MCHC: 33.8 g/dL (ref 30.0–36.0)
MCV: 93.2 fL (ref 80.0–100.0)
Monocytes Absolute: 0.7 10*3/uL (ref 0.1–1.0)
Monocytes Relative: 9 %
Neutro Abs: 3.9 10*3/uL (ref 1.7–7.7)
Neutrophils Relative %: 55 %
Platelets: 217 10*3/uL (ref 150–400)
RBC: 3.65 MIL/uL — ABNORMAL LOW (ref 4.22–5.81)
RDW: 13.6 % (ref 11.5–15.5)
WBC: 7.1 10*3/uL (ref 4.0–10.5)
nRBC: 0 % (ref 0.0–0.2)

## 2023-06-30 MED ORDER — LEUPROLIDE ACETATE (6 MONTH) 45 MG ~~LOC~~ KIT
45.0000 mg | PACK | Freq: Once | SUBCUTANEOUS | Status: DC
  Filled 2023-06-30: qty 45

## 2023-06-30 NOTE — Progress Notes (Signed)
Patient requests to stop Eligard 45 mg and change to 3 month dose of 22.5 mg  Orders agreed by Dr Ellin Saba and entered.  Pryor Ochoa, PharmD

## 2023-06-30 NOTE — Patient Instructions (Signed)
[facility] [place] - A DEPT OF [facility]  Discharge Instructions: Thank you for choosing [facility] to provide your oncology and hematology care.  If you have a lab appointment with the Cancer Center - please note that after April 8th, 2024, all labs will be drawn in the cancer center.  You do not have to check in or register with the main entrance as you have in the past but will complete your check-in in the cancer center.  Wear comfortable clothing and clothing appropriate for easy access to any Portacath or PICC line.   We strive to give you quality time with your provider. You may need to reschedule your appointment if you arrive late (15 or more minutes).  Arriving late affects you and other patients whose appointments are after yours.  Also, if you miss three or more appointments without notifying the office, you may be dismissed from the clinic at the provider's discretion.      For prescription refill requests, have your pharmacy contact our office and allow 72 hours for refills to be completed.    Patient rescheduled for injection tomorrow.    To help prevent nausea and vomiting after your treatment, we encourage you to take your nausea medication as directed.  BELOW ARE SYMPTOMS THAT SHOULD BE REPORTED IMMEDIATELY: *FEVER GREATER THAN 100.4 F (38 C) OR HIGHER *CHILLS OR SWEATING *NAUSEA AND VOMITING THAT IS NOT CONTROLLED WITH YOUR NAUSEA MEDICATION *UNUSUAL SHORTNESS OF BREATH *UNUSUAL BRUISING OR BLEEDING *URINARY PROBLEMS (pain or burning when urinating, or frequent urination) *BOWEL PROBLEMS (unusual diarrhea, constipation, pain near the anus) TENDERNESS IN MOUTH AND THROAT WITH OR WITHOUT PRESENCE OF ULCERS (sore throat, sores in mouth, or a toothache) UNUSUAL RASH, SWELLING OR PAIN  UNUSUAL VAGINAL DISCHARGE OR ITCHING   Items with * indicate a potential emergency and should be followed up as soon as possible or go to the  Emergency Department if any problems should occur.  Please show the CHEMOTHERAPY ALERT CARD or IMMUNOTHERAPY ALERT CARD at check-in to the Emergency Department and triage nurse.  Should you have questions after your visit or need to cancel or reschedule your appointment, please contact [facility] [place] - A DEPT OF [facility] [phone]  and follow the prompts.  Office hours are 8:00 a.m. to 4:30 p.m. Monday - Friday. Please note that voicemails left after 4:00 p.m. may not be returned until the following business day.  We are closed weekends and major holidays. You have access to a nurse at all times for urgent questions. Please call the main number to the clinic [phone] and follow the prompts.  For any non-urgent questions, you may also contact your provider using MyChart. We now offer e-Visits for anyone 61 and older to request care online for non-urgent symptoms. For details visit [email or web address].   Also download the MyChart app! Go to the app store, search "MyChart", open the app, select , and log in with your MyChart username and password.

## 2023-06-30 NOTE — Progress Notes (Signed)
No injection given today, patient stated he cannot tolerate that 6 months dose. He is not able to walk or function with that dose. MD made aware, Pryor Ochoa [facility] notified and will order dose to be given tomorrow. Patient agrees and will come tomorrow as scheduled.

## 2023-07-01 ENCOUNTER — Inpatient Hospital Stay: Payer: Medicaid Other

## 2023-07-01 VITALS — BP 133/66 | HR 85 | Temp 98.3°F | Resp 18

## 2023-07-01 DIAGNOSIS — C61 Malignant neoplasm of prostate: Secondary | ICD-10-CM

## 2023-07-01 DIAGNOSIS — Z5111 Encounter for antineoplastic chemotherapy: Secondary | ICD-10-CM | POA: Diagnosis not present

## 2023-07-01 LAB — PSA: Prostatic Specific Antigen: 1.07 ng/mL (ref 0.00–4.00)

## 2023-07-01 LAB — TESTOSTERONE: Testosterone: <3 ng/dL — ABNORMAL LOW (ref 264–916)

## 2023-07-01 MED ORDER — LEUPROLIDE ACETATE (3 MONTH) 22.5 MG ~~LOC~~ KIT
22.5000 mg | PACK | Freq: Once | SUBCUTANEOUS | Status: AC
  Administered 2023-07-01: 22.5 mg via SUBCUTANEOUS
  Filled 2023-07-01: qty 22.5

## 2023-07-01 NOTE — Progress Notes (Signed)
Patient tolerated Eligard injection with no complaints voiced.  Site clean and dry with no bruising or swelling noted at site.  See MAR for details.  Band aid applied.  Patient stable during and after injection.  Vss with discharge and left in satisfactory condition with no s/s of distress noted. All follow ups as scheduled.   Russell Browning [employer]

## 2023-07-07 ENCOUNTER — Inpatient Hospital Stay (HOSPITAL_BASED_OUTPATIENT_CLINIC_OR_DEPARTMENT_OTHER): Payer: Medicaid Other | Admitting: Hematology

## 2023-07-07 VITALS — BP 118/67 | HR 73 | Temp 98.2°F | Resp 18 | Ht 69.0 in | Wt 140.0 lb

## 2023-07-07 DIAGNOSIS — C61 Malignant neoplasm of prostate: Secondary | ICD-10-CM

## 2023-07-07 DIAGNOSIS — Z5111 Encounter for antineoplastic chemotherapy: Secondary | ICD-10-CM | POA: Diagnosis not present

## 2023-07-07 NOTE — Progress Notes (Signed)
[facility] [address], [place] [address]   Clinic Day:  07/14/23   Referring physician: [facility]  Patient Care Team: [facility] as PCP - General Jonelle Sidle, MD as PCP - Cardiology (Cardiology) Mealor, Roberts Gaudy, MD as PCP - Electrophysiology (Cardiology) Doreatha Massed, MD as Medical Oncologist (Medical Oncology)   ASSESSMENT & PLAN:   Assessment:  1.  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 - Rising PSA and documented bone mets in the right hip and right femur, s/p XRT directed to the right hip (30 Wallace Cullens in 12 fractions) concluded in 07/2011 for palliative purposes. - Lupron 22.5 mg started in 2013.  He has been receiving 3 months shot spread over every 6 months.  He was followed by Dr. Clelia Croft - Germline mutation testing: Negative  2.  Social/family history: - Lives at home with his wife. 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  Non-smoker. - Father had lung cancer.  4 of his sisters had breast cancer.  Plan:  1.  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  His PSA has been rising for the last several times.  Hence I have recommended increasing the dose of Eligard to 45 mg every 6 months.  He did receive 45 mg dose on 12/28/2022 but could not tolerated as he felt weak and stayed in bed for few days.  He also had worsening hot flashes and decreased heart rate. - We reviewed his labs from 06/30/2023: Normal LFTs.  CBC grossly normal with mild anemia.  Testosterone level is undetectable.  PSA is 1.07, down from 1.55. - We will switch him back to Eligard 22.5 mg every 3 months. - We discussed genetic testing results which were  negative. - RTC 6 months with repeat PSA.   2.  Hot flashes: - Continue Megace 40 mg twice daily.  He is also on Effexor 150 mg daily.   Orders Placed This Encounter  Procedures   CBC with Differential    Standing Status:   Future    Expected Date:   12/28/2023    Expiration Date:   07/06/2024   Comprehensive metabolic panel    Standing Status:   Future    Expected Date:   12/28/2023    Expiration Date:   07/06/2024   PSA    Standing Status:   Future    Expected Date:   12/28/2023    Expiration Date:   07/06/2024      Mikeal Hawthorne R Teague,acting as a scribe for Doreatha Massed, MD.,have documented all relevant documentation on the behalf of Doreatha Massed, MD,as directed by  Doreatha Massed, MD while in the presence of Doreatha Massed, MD.  I, Doreatha Massed MD, have reviewed the above documentation for accuracy and completeness, and I agree with the above.    Doreatha Massed, MD   1/30/20259:58 AM  CHIEF COMPLAINT/PURPOSE OF CONSULT:   Diagnosis: Castration-sensitive advanced prostate cancer with disease to the bone   Current Therapy:  androgen deprivation therapy, Eliguard every 3 to 6 months  HISTORY OF PRESENT ILLNESS:   Russell Browning is a 83 y.o. male presenting to clinic today for evaluation of prostate cancer  for transfer of care from Dr. Clelia Croft.  His last visit with Dr. Clelia Croft was on 06/10/22. It was noted that the patient tolerated Eliguard without any issues and his PSA, though under control, was slowly rising. His CBC from that visit found abnormal RBC at 3.84, hemoglobin at 11.7, and HCT at 35.0.   Today, he states that he is doing well overall. His appetite level is at 100%. His energy level is at 100%.  INTERVAL HISTORY:   Russell Schertzer. is a 83 y.o. male presenting to the clinic today for follow-up of Castration-sensitive advanced prostate cancer metastasized to bone. He was last seen by me on 12/28/22.  Today, he states that he is doing  well overall. His appetite level is at 100%. His energy level is at 75%. He is accompanied by a family member.   He could not tolerate Eligard injection and caused him severe weakness, hot flashes, and his heart rate to drop. This left him bedridden for 3 full days after his injection. He is taking Megace prn for hot flashes and Effexor.   PAST MEDICAL HISTORY:   Past Medical History: Past Medical History:  Diagnosis Date   Anemia    CAD (coronary artery disease), native coronary artery    a. DES to proximal, mid, and distal RCA 2006. b. 12/2014 - [place] s/p  PTCA to the mid-distal previously placed RCA stents, otherwise nonobstructive CAD for continued medical therapy.   CHF (congestive heart failure) (HCC)    History of TIAs    Mixed hyperlipidemia    Pacemaker lead failure    RA lead insulation defect noted at Generator change by Dr Graciela Husbands 2008, impedance now low, but lead functioning otherwise normally   PAF (paroxysmal atrial fibrillation) [facility])    Prostate cancer metastatic to bone [facility])    Sinus node dysfunction [facility])    Medtronic pacemaker - Dr. Johney Frame    Surgical History: Past Surgical History:  Procedure Laterality Date   CARDIAC CATHETERIZATION N/A 01/06/2015   Procedure: Left Heart Cath and Coronary Angiography;  Surgeon: Lennette Bihari, MD;  Location: [facility] INVASIVE CV LAB;  Service: Cardiovascular;  Laterality: N/A;   LEAD REVISION/REPAIR N/A 08/25/2020   Procedure: LEAD REVISION/REPAIR;  Surgeon: Lanier Prude, MD;  Location: [facility] INVASIVE CV LAB;  Service: Cardiovascular;  Laterality: N/A;   PACEMAKER IMPLANT N/A 08/25/2020   Procedure: PACEMAKER IMPLANT;  Surgeon: Lanier Prude, MD;  Location: [facility] INVASIVE CV LAB;  Service: Cardiovascular;  Laterality: N/A;   PACEMAKER INSERTION     MDT   PPM GENERATOR CHANGEOUT N/A 04/11/2020   Procedure: PPM GENERATOR CHANGEOUT;  Surgeon: Duke Salvia, MD;  Location: [facility] INVASIVE CV LAB;  Service: Cardiovascular;  Laterality: N/A;     Social History: Social History   Socioeconomic History   Marital status: Married    Spouse name: Not on file   Number of children: Not on file   Years of education: Not on file   Highest education level: Not on file  Occupational History   Not on file  Tobacco Use   Smoking status: Former    Current packs/day: 0.00    Average packs/day: 1 pack/day for 13.7 years (13.7 ttl pk-yrs)    Types: Cigarettes    Start date: 09/15/1951    Quit date: 06/14/1965    Years since quitting: 58.1   Smokeless tobacco: Former    Types: Chew    Quit date: 06/14/1978   Tobacco comments:  chewed tobacco 5 years 1PPD  Vaping Use   Vaping status: Never Used  Substance and Sexual Activity   Alcohol use: No    Alcohol/week: 0.0 standard drinks of alcohol   Drug use: No   Sexual activity: Not on file  Other Topics Concern   Not on file  Social History Narrative   Not on file   Social Drivers of Health   Financial Resource Strain: Not on file  Food Insecurity: No Food Insecurity (12/28/2022)   Hunger Vital Sign    Worried About Running Out of Food in the Last Year: Never true    Ran Out of Food in the Last Year: Never true  Transportation Needs: No Transportation Needs (12/28/2022)   PRAPARE - Administrator, Civil Service (Medical): No    Lack of Transportation (Non-Medical): No  Physical Activity: Not on file  Stress: Not on file  Social Connections: Not on file  Intimate Partner Violence: Not At Risk (12/28/2022)   Humiliation, Afraid, Rape, and Kick questionnaire    Fear of Current or Ex-Partner: No    Emotionally Abused: No    Physically Abused: No    Sexually Abused: No    Family History: Family History  Problem Relation Age of Onset   Heart disease Father    Heart disease Sister     Current Medications:  Current Outpatient Medications:    aspirin EC 81 MG EC tablet, Take 1 tablet (81 mg total) by mouth daily., Disp: 30 tablet, Rfl: 11   Calcium  Carbonate-Vitamin D (CALCIUM-VITAMIN D) 500-200 MG-UNIT per tablet, Take 1 tablet by mouth daily., Disp: , Rfl:    clonazePAM (KLONOPIN) 1 MG tablet, Take 2 tablets (2 mg total) by mouth at bedtime., Disp: , Rfl:    clopidogrel (PLAVIX) 75 MG tablet, Take 1 tablet (75 mg total) by mouth daily., Disp: 90 tablet, Rfl: 1   guaiFENesin (MUCINEX) 600 MG 12 hr tablet, Take 600 mg by mouth 2 (two) times daily as needed for cough or to loosen phlegm., Disp: , Rfl:    isosorbide mononitrate (IMDUR) 30 MG 24 hr tablet, Take 1 tablet (30 mg total) by mouth daily., Disp: 90 tablet, Rfl: 1   levothyroxine (SYNTHROID, LEVOTHROID) 50 MCG tablet, Take 50 mcg by mouth daily before breakfast. , Disp: , Rfl:    megestrol (MEGACE) 40 MG tablet, TAKE 1 TABLET BY MOUTH 2 TIMES A DAY, Disp: 60 tablet, Rfl: 3   midodrine (PROAMATINE) 2.5 MG tablet, Take 1 tablet (2.5 mg total) by mouth 2 (two) times daily with a meal., Disp: 60 tablet, Rfl: 6   nitroGLYCERIN (NITROSTAT) 0.4 MG SL tablet, Place 1 tablet (0.4 mg total) under the tongue every 5 (five) minutes x 3 doses as needed for chest pain (if no relief after 3rd dose, proceed to ED or call 911)., Disp: 25 tablet, Rfl: 3   pantoprazole (PROTONIX) 40 MG tablet, Take 40 mg by mouth 2 (two) times daily before a meal., Disp: , Rfl:    simvastatin (ZOCOR) 40 MG tablet, Take 40 mg by mouth at bedtime., Disp: , Rfl:    venlafaxine (EFFEXOR) 37.5 MG tablet, Take 150 mg by mouth daily., Disp: , Rfl:    vitamin B-12 (CYANOCOBALAMIN) 1000 MCG tablet, Take 1,000 mcg by mouth daily., Disp: , Rfl:    Allergies: Allergies  Allergen Reactions   Penicillins Other (See Comments)    Passed out   Phenazopyridine Hcl Hives   Ramipril Hives  REVIEW OF SYSTEMS:   Review of Systems  Constitutional:  Negative for chills, fatigue and fever.  HENT:   Negative for lump/mass, mouth sores, nosebleeds, sore throat and trouble swallowing.   Eyes:  Negative for eye problems.  Respiratory:   Negative for cough and shortness of breath.   Cardiovascular:  Negative for chest pain, leg swelling and palpitations.  Gastrointestinal:  Positive for constipation. Negative for abdominal pain, diarrhea, nausea and vomiting.  Genitourinary:  Negative for bladder incontinence, difficulty urinating, dysuria, frequency, hematuria and nocturia.   Musculoskeletal:  Negative for arthralgias, back pain, flank pain, myalgias and neck pain.  Skin:  Negative for itching and rash.  Neurological:  Positive for dizziness. Negative for headaches and numbness.  Hematological:  Does not bruise/bleed easily.  Psychiatric/Behavioral:  Positive for sleep disturbance. Negative for depression and suicidal ideas. The patient is not nervous/anxious.   All other systems reviewed and are negative.    VITALS:   Blood pressure 118/67, pulse 73, temperature 98.2 F (36.8 C), temperature source Tympanic, resp. rate 18, height 5\' 9"  (1.753 m), weight 140 lb (63.5 kg), SpO2 100%.  Wt Readings from Last 3 Encounters:  07/07/23 140 lb (63.5 kg)  06/21/23 139 lb (63 kg)  12/28/22 135 lb 4.8 oz (61.4 kg)    Body mass index is 20.67 kg/m.   PHYSICAL EXAM:   Physical Exam Vitals and nursing note reviewed. Exam conducted with a chaperone present.  Constitutional:      Appearance: Normal appearance.  Cardiovascular:     Rate and Rhythm: Normal rate and regular rhythm.     Pulses: Normal pulses.     Heart sounds: Normal heart sounds.  Pulmonary:     Effort: Pulmonary effort is normal.     Breath sounds: Normal breath sounds.  Abdominal:     Palpations: Abdomen is soft. There is no hepatomegaly, splenomegaly or mass.     Tenderness: There is no abdominal tenderness.  Musculoskeletal:     Right lower leg: No edema.     Left lower leg: No edema.  Lymphadenopathy:     Cervical: No cervical adenopathy.     Right cervical: No superficial, deep or posterior cervical adenopathy.    Left cervical: No superficial,  deep or posterior cervical adenopathy.     Upper Body:     Right upper body: No supraclavicular or axillary adenopathy.     Left upper body: No supraclavicular or axillary adenopathy.  Neurological:     General: No focal deficit present.     Mental Status: He is alert and oriented to person, place, and time.  Psychiatric:        Mood and Affect: Mood normal.        Behavior: Behavior normal.     LABS:      Latest Ref Rng & Units 06/30/2023    2:01 PM 12/28/2022    2:00 PM 06/10/2022   10:17 AM  CBC  WBC 4.0 - 10.5 K/uL 7.1  6.9  5.5   Hemoglobin 13.0 - 17.0 g/dL 16.1  09.6  04.5   Hematocrit 39.0 - 52.0 % 34.0  37.1  35.0   Platelets 150 - 400 K/uL 217  210  165       Latest Ref Rng & Units 06/30/2023    2:01 PM 12/28/2022    2:00 PM 06/10/2022   10:17 AM  CMP  Glucose 70 - 99 mg/dL 87  92  88   BUN 8 - 23  mg/dL 15  15  13    Creatinine 0.61 - 1.24 mg/dL 4.40  1.02  7.25   Sodium 135 - 145 mmol/L 136  134  138   Potassium 3.5 - 5.1 mmol/L 4.4  4.3  4.0   Chloride 98 - 111 mmol/L 105  99  104   CO2 22 - 32 mmol/L 23  27  30    Calcium 8.9 - 10.3 mg/dL 9.4  9.4  9.4   Total Protein 6.5 - 8.1 g/dL 6.9  7.2  6.5   Total Bilirubin 0.0 - 1.2 mg/dL 0.5  0.7  0.3   Alkaline Phos 38 - 126 U/L 17  26  23    AST 15 - 41 U/L 17  18  15    ALT 0 - 44 U/L 12  12  9       No results found for: "CEA1", "CEA" / No results found for: "CEA1", "CEA" Lab Results  Component Value Date   PSA1 1.0 06/10/2022   No results found for: "DGU440" No results found for: "CAN125"  No results found for: "TOTALPROTELP", "ALBUMINELP", "A1GS", "A2GS", "BETS", "BETA2SER", "GAMS", "MSPIKE", "SPEI" No results found for: "TIBC", "FERRITIN", "IRONPCTSAT" No results found for: "LDH"   STUDIES:   CUP PACEART REMOTE DEVICE CHECK Result Date: 07/11/2023 Scheduled remote reviewed. Normal device function.  Next remote 91 days. - CS, CVRS

## 2023-07-11 ENCOUNTER — Ambulatory Visit (INDEPENDENT_AMBULATORY_CARE_PROVIDER_SITE_OTHER): Payer: Medicare Other

## 2023-07-11 DIAGNOSIS — I495 Sick sinus syndrome: Secondary | ICD-10-CM | POA: Diagnosis not present

## 2023-07-11 LAB — CUP PACEART REMOTE DEVICE CHECK
Battery Remaining Longevity: 130 mo
Battery Voltage: 3.01 V
Brady Statistic AP VP Percent: 0.05 %
Brady Statistic AP VS Percent: 55.59 %
Brady Statistic AS VP Percent: 0 %
Brady Statistic AS VS Percent: 44.37 %
Brady Statistic RA Percent Paced: 55.56 %
Brady Statistic RV Percent Paced: 0.05 %
Date Time Interrogation Session: 20250126194927
Implantable Lead Connection Status: 753985
Implantable Lead Connection Status: 753985
Implantable Lead Implant Date: 20220314
Implantable Lead Implant Date: 20220314
Implantable Lead Location: 753859
Implantable Lead Location: 753860
Implantable Lead Model: 5076
Implantable Lead Model: 5076
Implantable Pulse Generator Implant Date: 20211029
Lead Channel Impedance Value: 323 Ohm
Lead Channel Impedance Value: 342 Ohm
Lead Channel Impedance Value: 380 Ohm
Lead Channel Impedance Value: 399 Ohm
Lead Channel Pacing Threshold Amplitude: 0.5 V
Lead Channel Pacing Threshold Amplitude: 0.75 V
Lead Channel Pacing Threshold Pulse Width: 0.4 ms
Lead Channel Pacing Threshold Pulse Width: 0.4 ms
Lead Channel Sensing Intrinsic Amplitude: 2.625 mV
Lead Channel Sensing Intrinsic Amplitude: 2.625 mV
Lead Channel Sensing Intrinsic Amplitude: 5.875 mV
Lead Channel Sensing Intrinsic Amplitude: 5.875 mV
Lead Channel Setting Pacing Amplitude: 1.5 V
Lead Channel Setting Pacing Amplitude: 2 V
Lead Channel Setting Pacing Pulse Width: 0.4 ms
Lead Channel Setting Sensing Sensitivity: 1.2 mV
Zone Setting Status: 755011

## 2023-07-14 ENCOUNTER — Encounter: Payer: Self-pay | Admitting: Hematology

## 2023-07-23 ENCOUNTER — Other Ambulatory Visit: Payer: Self-pay | Admitting: Cardiology

## 2023-08-10 ENCOUNTER — Other Ambulatory Visit: Payer: Self-pay | Admitting: Hematology

## 2023-08-11 ENCOUNTER — Encounter: Payer: Self-pay | Admitting: Hematology

## 2023-08-22 NOTE — Progress Notes (Signed)
 Remote pacemaker transmission.

## 2023-10-06 ENCOUNTER — Inpatient Hospital Stay: Payer: Medicaid Other | Attending: Hematology

## 2023-10-06 VITALS — BP 146/73 | HR 80 | Resp 16 | Wt 140.0 lb

## 2023-10-06 DIAGNOSIS — C61 Malignant neoplasm of prostate: Secondary | ICD-10-CM | POA: Insufficient documentation

## 2023-10-06 DIAGNOSIS — Z5111 Encounter for antineoplastic chemotherapy: Secondary | ICD-10-CM | POA: Diagnosis present

## 2023-10-06 DIAGNOSIS — C7951 Secondary malignant neoplasm of bone: Secondary | ICD-10-CM | POA: Insufficient documentation

## 2023-10-06 MED ORDER — LEUPROLIDE ACETATE (3 MONTH) 22.5 MG ~~LOC~~ KIT
22.5000 mg | PACK | Freq: Once | SUBCUTANEOUS | Status: AC
  Administered 2023-10-06: 22.5 mg via SUBCUTANEOUS
  Filled 2023-10-06: qty 22.5

## 2023-10-06 NOTE — Progress Notes (Signed)
Eligard injection given per orders. Patient tolerated it well without problems. Vitals stable and discharged home from clinic ambulatory. Follow up as scheduled.

## 2023-10-06 NOTE — Patient Instructions (Signed)
 [facility] [place] - A DEPT OF [place]  Discharge Instructions: Thank you for choosing [facility] to provide your oncology and hematology care.  If you have a lab appointment with the Cancer Center - please note that after April 8th, 2024, all labs will be drawn in the cancer center.  You do not have to check in or register with the main entrance as you have in the past but will complete your check-in in the cancer center.  Wear comfortable clothing and clothing appropriate for easy access to any Portacath or PICC line.   We strive to give you quality time with your provider. You may need to reschedule your appointment if you arrive late (15 or more minutes).  Arriving late affects you and other patients whose appointments are after yours.  Also, if you miss three or more appointments without notifying the office, you may be dismissed from the clinic at the provider's discretion.      For prescription refill requests, have your pharmacy contact our office and allow 72 hours for refills to be completed.    Today you received the following chemotherapy and/or immunotherapy agents Eligard  injection    To help prevent nausea and vomiting after your treatment, we encourage you to take your nausea medication as directed.  BELOW ARE SYMPTOMS THAT SHOULD BE REPORTED IMMEDIATELY: *FEVER GREATER THAN 100.4 F (38 C) OR HIGHER *CHILLS OR SWEATING *NAUSEA AND VOMITING THAT IS NOT CONTROLLED WITH YOUR NAUSEA MEDICATION *UNUSUAL SHORTNESS OF BREATH *UNUSUAL BRUISING OR BLEEDING *URINARY PROBLEMS (pain or burning when urinating, or frequent urination) *BOWEL PROBLEMS (unusual diarrhea, constipation, pain near the anus) TENDERNESS IN MOUTH AND THROAT WITH OR WITHOUT PRESENCE OF ULCERS (sore throat, sores in mouth, or a toothache) UNUSUAL RASH, SWELLING OR PAIN  UNUSUAL VAGINAL DISCHARGE OR ITCHING   Items with * indicate a potential emergency and should be  followed up as soon as possible or go to the Emergency Department if any problems should occur.  Please show the CHEMOTHERAPY ALERT CARD or IMMUNOTHERAPY ALERT CARD at check-in to the Emergency Department and triage nurse.  Should you have questions after your visit or need to cancel or reschedule your appointment, please contact [facility]  - A DEPT OF [facility] [phone]  and follow the prompts.  Office hours are 8:00 a.m. to 4:30 p.m. Monday - Friday. Please note that voicemails left after 4:00 p.m. may not be returned until the following business day.  We are closed weekends and major holidays. You have access to a nurse at all times for urgent questions. Please call the main number to the clinic [phone] and follow the prompts.  For any non-urgent questions, you may also contact your provider using MyChart. We now offer e-Visits for anyone 55 and older to request care online for non-urgent symptoms. For details visit [email or web address].   Also download the MyChart app! Go to the app store, search "MyChart", open the app, select [place], and log in with your MyChart username and password.

## 2023-10-10 ENCOUNTER — Ambulatory Visit (INDEPENDENT_AMBULATORY_CARE_PROVIDER_SITE_OTHER): Payer: Medicare Other

## 2023-10-10 DIAGNOSIS — I495 Sick sinus syndrome: Secondary | ICD-10-CM | POA: Diagnosis not present

## 2023-10-12 LAB — CUP PACEART REMOTE DEVICE CHECK
Battery Remaining Longevity: 128 mo
Battery Voltage: 3.01 V
Brady Statistic AP VP Percent: 0.02 %
Brady Statistic AP VS Percent: 42.92 %
Brady Statistic AS VP Percent: 0 %
Brady Statistic AS VS Percent: 57.06 %
Brady Statistic RA Percent Paced: 42.89 %
Brady Statistic RV Percent Paced: 0.03 %
Date Time Interrogation Session: 20250430101258
Implantable Lead Connection Status: 753985
Implantable Lead Connection Status: 753985
Implantable Lead Implant Date: 20220314
Implantable Lead Implant Date: 20220314
Implantable Lead Location: 753859
Implantable Lead Location: 753860
Implantable Lead Model: 5076
Implantable Lead Model: 5076
Implantable Pulse Generator Implant Date: 20211029
Lead Channel Impedance Value: 342 Ohm
Lead Channel Impedance Value: 361 Ohm
Lead Channel Impedance Value: 399 Ohm
Lead Channel Impedance Value: 418 Ohm
Lead Channel Pacing Threshold Amplitude: 0.5 V
Lead Channel Pacing Threshold Amplitude: 0.75 V
Lead Channel Pacing Threshold Pulse Width: 0.4 ms
Lead Channel Pacing Threshold Pulse Width: 0.4 ms
Lead Channel Sensing Intrinsic Amplitude: 10.5 mV
Lead Channel Sensing Intrinsic Amplitude: 10.5 mV
Lead Channel Sensing Intrinsic Amplitude: 2.875 mV
Lead Channel Sensing Intrinsic Amplitude: 2.875 mV
Lead Channel Setting Pacing Amplitude: 1.5 V
Lead Channel Setting Pacing Amplitude: 2 V
Lead Channel Setting Pacing Pulse Width: 0.4 ms
Lead Channel Setting Sensing Sensitivity: 1.2 mV
Zone Setting Status: 755011

## 2023-10-14 ENCOUNTER — Encounter: Payer: Self-pay | Admitting: Cardiovascular Disease

## 2023-10-14 ENCOUNTER — Ambulatory Visit: Attending: Cardiovascular Disease | Admitting: Cardiovascular Disease

## 2023-10-14 ENCOUNTER — Ambulatory Visit: Payer: Medicare Other | Attending: Cardiovascular Disease | Admitting: Cardiovascular Disease

## 2023-10-14 VITALS — BP 118/68 | HR 78 | Ht 69.0 in | Wt 140.8 lb

## 2023-10-14 DIAGNOSIS — I480 Paroxysmal atrial fibrillation: Secondary | ICD-10-CM | POA: Insufficient documentation

## 2023-10-14 DIAGNOSIS — I495 Sick sinus syndrome: Secondary | ICD-10-CM | POA: Insufficient documentation

## 2023-10-14 LAB — CUP PACEART INCLINIC DEVICE CHECK
Date Time Interrogation Session: 20250502140911
Implantable Lead Connection Status: 753985
Implantable Lead Connection Status: 753985
Implantable Lead Implant Date: 20220314
Implantable Lead Implant Date: 20220314
Implantable Lead Location: 753859
Implantable Lead Location: 753860
Implantable Lead Model: 5076
Implantable Lead Model: 5076
Implantable Pulse Generator Implant Date: 20211029

## 2023-10-14 NOTE — Progress Notes (Signed)
    PCP: [facility]   Primary EP:  Dr Arlester Ladd  Russell Budge Sr. is a 83 y.o. male who presents today for routine electrophysiology followup.  Since last being seen in our clinic, the patient reports doing very well.    He has a pacemaker for sinus node dysfunction.  The original left-sided device failed.  The leads were capped and a right-sided device placed.  He has had device detected atrial fibrillation and started on apixaban .  However, he cannot afford the medication and has not been interested in taking Coumadin.  Today, he denies symptoms of palpitations, chest pain, shortness of breath,  lower extremity edema, dizziness, presyncope, or syncope.  The patient is otherwise without complaint today.     Physical Exam: Vitals:   10/14/23 1335  BP: 118/68  Pulse: 78  SpO2: 98%  Weight: 140 lb 12.8 oz (63.9 kg)  Height: 5\' 9"  (1.753 m)    Gen: Appears comfortable, well-nourished CV: RRR, no dependent edema The device site is normal -- no tenderness, edema, drainage, redness, threatened erosion. Pulm: breathing easily   Pacemaker interrogation- reviewed in detail today,  See PACEART report   Assessment and Plan:  1. Symptomatic sinus bradycardia  complete heart block Normal pacemaker function See Pace Art report No changes today he is not device dependant today  2. Paroxysmal atrial fibrillation No events detected on interrogation today I discussed the indication for anticoagulation with him again today. He has declined warfarin in the past. NOACs are too costly. He prefers to stay on ASA/plavix .   3. CAD Stable No change required today  4. HL Stable No change required today  Return in 12 months  Efraim Grange, MD 10/14/2023 2:04 PM

## 2023-10-14 NOTE — Patient Instructions (Signed)

## 2023-11-30 ENCOUNTER — Encounter: Payer: Self-pay | Admitting: Hematology

## 2023-11-30 ENCOUNTER — Other Ambulatory Visit: Payer: Self-pay | Admitting: *Deleted

## 2023-11-30 DIAGNOSIS — C61 Malignant neoplasm of prostate: Secondary | ICD-10-CM

## 2023-12-01 ENCOUNTER — Telehealth: Payer: Self-pay | Admitting: *Deleted

## 2023-12-01 NOTE — Progress Notes (Signed)
 Remote pacemaker transmission.

## 2023-12-01 NOTE — Addendum Note (Signed)
 Addended by: Edra Govern D on: 12/01/2023 01:06 PM   Modules accepted: Orders

## 2023-12-01 NOTE — Telephone Encounter (Signed)
 Patient presented to [facility]  and had CT Lumbar spine and Pelvis.  Multiple bony lesions noted on scans.  Per Dr. Katragadda, will schedule for a PSMA PET and follow up after scan.  Patient and wife aware and verbalize understanding.

## 2023-12-14 ENCOUNTER — Encounter: Payer: Self-pay | Admitting: Hematology

## 2023-12-15 ENCOUNTER — Ambulatory Visit (HOSPITAL_COMMUNITY)
Admission: RE | Admit: 2023-12-15 | Discharge: 2023-12-15 | Disposition: A | Discharge status: Home / Self Care | Source: Ambulatory Visit | Attending: Hematology | Admitting: Hematology

## 2023-12-15 ENCOUNTER — Inpatient Hospital Stay: Attending: Hematology

## 2023-12-15 ENCOUNTER — Encounter: Payer: Self-pay | Admitting: Hematology

## 2023-12-15 DIAGNOSIS — Z801 Family history of malignant neoplasm of trachea, bronchus and lung: Secondary | ICD-10-CM | POA: Diagnosis not present

## 2023-12-15 DIAGNOSIS — C61 Malignant neoplasm of prostate: Secondary | ICD-10-CM | POA: Insufficient documentation

## 2023-12-15 DIAGNOSIS — Z79899 Other long term (current) drug therapy: Secondary | ICD-10-CM | POA: Insufficient documentation

## 2023-12-15 DIAGNOSIS — Z803 Family history of malignant neoplasm of breast: Secondary | ICD-10-CM | POA: Insufficient documentation

## 2023-12-15 DIAGNOSIS — C7951 Secondary malignant neoplasm of bone: Secondary | ICD-10-CM | POA: Diagnosis present

## 2023-12-15 DIAGNOSIS — Z5111 Encounter for antineoplastic chemotherapy: Secondary | ICD-10-CM | POA: Insufficient documentation

## 2023-12-15 DIAGNOSIS — R61 Generalized hyperhidrosis: Secondary | ICD-10-CM | POA: Insufficient documentation

## 2023-12-15 LAB — CBC WITH DIFFERENTIAL/PLATELET
Abs Immature Granulocytes: 0.03 10*3/uL (ref 0.00–0.07)
Basophils Absolute: 0.1 10*3/uL (ref 0.0–0.1)
Basophils Relative: 1 %
Eosinophils Absolute: 0.2 10*3/uL (ref 0.0–0.5)
Eosinophils Relative: 3 %
HCT: 35.3 % — ABNORMAL LOW (ref 39.0–52.0)
Hemoglobin: 11.7 g/dL — ABNORMAL LOW (ref 13.0–17.0)
Immature Granulocytes: 0 %
Lymphocytes Relative: 31 %
Lymphs Abs: 2.1 10*3/uL (ref 0.7–4.0)
MCH: 30.8 pg (ref 26.0–34.0)
MCHC: 33.1 g/dL (ref 30.0–36.0)
MCV: 92.9 fL (ref 80.0–100.0)
Monocytes Absolute: 0.7 10*3/uL (ref 0.1–1.0)
Monocytes Relative: 10 %
Neutro Abs: 3.8 10*3/uL (ref 1.7–7.7)
Neutrophils Relative %: 55 %
Platelets: 203 10*3/uL (ref 150–400)
RBC: 3.8 MIL/uL — ABNORMAL LOW (ref 4.22–5.81)
RDW: 13.8 % (ref 11.5–15.5)
WBC: 6.9 10*3/uL (ref 4.0–10.5)
nRBC: 0 % (ref 0.0–0.2)

## 2023-12-15 LAB — COMPREHENSIVE METABOLIC PANEL WITH GFR
ALT: 12 U/L (ref 0–44)
AST: 18 U/L (ref 15–41)
Albumin: 3.8 g/dL (ref 3.5–5.0)
Alkaline Phosphatase: 17 U/L — ABNORMAL LOW (ref 38–126)
Anion gap: 8 (ref 5–15)
BUN: 11 mg/dL (ref 8–23)
CO2: 23 mmol/L (ref 22–32)
Calcium: 8.8 mg/dL — ABNORMAL LOW (ref 8.9–10.3)
Chloride: 105 mmol/L (ref 98–111)
Creatinine, Ser: 1.04 mg/dL (ref 0.61–1.24)
GFR, Estimated: >60 mL/min (ref >60)
Glucose, Bld: 78 mg/dL (ref 70–99)
Potassium: 3.6 mmol/L (ref 3.5–5.1)
Sodium: 136 mmol/L (ref 135–145)
Total Bilirubin: 0.3 mg/dL (ref 0.0–1.2)
Total Protein: 6.5 g/dL (ref 6.5–8.1)

## 2023-12-15 LAB — PSA: Prostatic Specific Antigen: 0.89 ng/mL (ref 0.00–4.00)

## 2023-12-15 MED ORDER — FLOTUFOLASTAT F 18 GALLIUM 296-5846 MBQ/ML IV SOLN
8.7100 | Freq: Once | INTRAVENOUS | Status: AC
  Administered 2023-12-15: 8.71 via INTRAVENOUS
  Filled 2023-12-15: qty 9

## 2023-12-22 ENCOUNTER — Inpatient Hospital Stay: Admitting: Hematology

## 2023-12-22 VITALS — BP 105/66 | HR 93 | Temp 98.3°F | Resp 19 | Wt 142.0 lb

## 2023-12-22 DIAGNOSIS — C61 Malignant neoplasm of prostate: Secondary | ICD-10-CM | POA: Diagnosis not present

## 2023-12-22 DIAGNOSIS — Z5111 Encounter for antineoplastic chemotherapy: Secondary | ICD-10-CM | POA: Diagnosis not present

## 2023-12-22 MED ORDER — TADALAFIL 5 MG PO TABS: 5.0000 mg | ORAL_TABLET | Freq: Every day | ORAL | 5 refills | Status: AC | PRN

## 2023-12-22 NOTE — Progress Notes (Signed)
 [facility] [address], [place] [address]   Clinic Day:  12/22/23   Referring physician: [facility]  Patient Care Team: [facility] as PCP - General Debera Jayson MATSU, MD as PCP - Cardiology (Cardiology) Mealor, Eulas BRAVO, MD as PCP - Electrophysiology (Cardiology) Rogers Hai, MD as Medical Oncologist (Medical Oncology)   ASSESSMENT & PLAN:   Assessment:  1.  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 - Rising PSA and documented bone mets in the right hip and right femur, s/p XRT directed to the right hip (30 [place]nor in 12 fractions) concluded in 07/2011 for palliative purposes. - Lupron  22.5 mg started in 2013.  He has been receiving 3 months shot spread over every 6 months.  He was followed by Dr. Amadeo.  At last visit in January 2025, I have switched him to Eligard  22.5 mg every 3 months. - Germline mutation testing: Negative  2.  Social/family history: - Lives at home with his wife. 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  Non-smoker. - Father had lung cancer.  4 of his sisters had breast cancer.  Plan:  1.  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 - He recently was evaluated at [facility] for low back pain radiating to the left leg.  CT lumbar spine and pelvis on 11/29/2023 reviewed by me showed partially visualized left L4 metastatic lesion.  Lumbar spine x-ray showed 1.6 cm lytic lesion at L4 vertebral body.  There is degenerative changes at L4-L5. - Because of these new findings, we have ordered PSMA PET scan. - We reviewed images of the PSMA PET scan from 12/15/2023: Focal activity associated with soft tissue fullness in the left aspect of the  prostatectomy bed concerning for local recurrence.  No evidence of metastatic adenopathy or bone lesions or visual lesions. - Labs from 12/15/2023: PSA improved to 0.89 from 1.07.  CBC grossly normal with mild normocytic anemia with hemoglobin 11.7.  LFTs are normal.  Creatinine is normal. - He continues to be castrate sensitive at this time.  Continue Eligard  22.5 mg every 3 months.  If there is progression, consider starting on androgen receptor pathway inhibitors.  RTC 6 months for follow-up with repeat PSA and labs. - He continues to have left leg numbness and some pain.  Will refer to orthopedics locally in [place]. - He reports erectile dysfunction.  Will give him prescription for Cialis  5 mg as needed and titrate up as tolerated.   2.  Hot flashes: - Continue Megace  40 mg twice daily which is helping.   No orders of the defined types were placed in this encounter.     LILLETTE Verneta SAUNDERS Teague,acting as a Neurosurgeon for Hai Rogers, MD.,have documented all relevant documentation on the behalf of Hai Rogers, MD,as directed by  Hai Rogers, MD while in the presence of Hai Rogers, MD.  I, Hai Rogers MD, have reviewed the above documentation for accuracy and completeness, and I agree with the above.     Hai Rogers, MD   7/10/20253:41 PM  CHIEF COMPLAINT/PURPOSE OF CONSULT:   Diagnosis: Castration-sensitive advanced prostate cancer with disease to the bone   Current Therapy:  androgen deprivation therapy, Eliguard every 3 to 6 months  HISTORY OF PRESENT ILLNESS:   Russell Browning is a 83 y.o. male presenting to clinic today for evaluation of prostate cancer for transfer of care from Dr. Amadeo.  His last visit with Dr. Amadeo was on 06/10/22. It was noted that the patient tolerated Eliguard without any issues and his PSA, though under control, was slowly rising. His CBC from that visit found abnormal RBC at 3.84, hemoglobin at 11.7, and HCT at  35.0.   Today, he states that he is doing well overall. His appetite level is at 100%. His energy level is at 100%.  INTERVAL HISTORY:   Russell Browning. is a 83 y.o. male presenting to the clinic today for follow-up of Castration-sensitive advanced prostate cancer metastasized to bone. He was last seen by me on 07/07/23.  Since his last visit, he presented to the ED on 11/29/23  at [facility] for left leg pain and discharged with opioids due to likely L4 lytic lesion. CT L-spine showed: No fracture or other acute pathology. 1.6 cm lytic lesion L4 vertebral body, indeterminate. At L4-5, degenerative changes cause mild subarticular recess stenosis and left greater than right neural foraminal stenosis.   CT pelvis found: No acute fracture or traumatic malalignment.Adjacent to the right and left SI joint there are a mixed lucent and sclerotic foci of the iliac bones measuring approximately a centimeter, most likely degenerative or small hemangiomata given somewhat stippled appearance. May also be further evaluated with MRI as needed given concomitant L4 metastatic lesion.   [name] then had PET PSMA on 12/15/23 that found: Focal radiotracer activity associated with soft tissue fullness in the LEFT aspect of the prostatectomy bed is concerning for local prostate carcinoma recurrence. No evidence of metastatic adenopathy in the pelvis or periaortic retroperitoneum. No evidence of visceral metastasis or skeletal metastasis. Pleuroparenchymal nodular thickening in the posterior aspect of the LEFT upper lobe is favored benign. No significant change from 2022. Aortic Atherosclerosis.  Today, he states that he is doing well overall. His appetite level is at 100%. His energy level is at 25%. He is accompanied by family members. Justin reports prior to ED visit, he notes left leg numbness and shooting pain from the left hip radiating down the left leg that has somewhat improved. His wife states he is unable to walk  without assistance and he would like a referral to an orthopedist for these symptoms.    [place] still requires megace  for hot flashes.   PAST MEDICAL HISTORY:   Past Medical History: Past Medical History:  Diagnosis Date   Anemia    CAD (coronary artery disease), native coronary artery    a. DES to proximal, mid, and distal RCA 2006. b. 12/2014 - [place]  s/p  PTCA to the mid-distal previously placed RCA stents, otherwise nonobstructive CAD for continued medical therapy.   CHF (congestive heart failure) (HCC)    History of TIAs    Mixed hyperlipidemia    Pacemaker lead failure    RA lead insulation defect noted at Generator change by Dr Fernande 2008, impedance now low, but lead functioning otherwise normally   PAF (paroxysmal atrial fibrillation) [facility])    Prostate cancer metastatic to bone [facility])    Sinus node dysfunction [facility])    [employer] pacemaker - Dr. Kelsie    Surgical History: Past Surgical History:  Procedure Laterality Date   CARDIAC CATHETERIZATION N/A 01/06/2015   Procedure: Left Heart Cath and Coronary Angiography;  Surgeon:  Debby DELENA Sor, MD;  Location: [facility] INVASIVE CV LAB;  Service: Cardiovascular;  Laterality: N/A;   LEAD REVISION/REPAIR N/A 08/25/2020   Procedure: LEAD REVISION/REPAIR;  Surgeon: Cindie Ole DASEN, MD;  Location: [facility] INVASIVE CV LAB;  Service: Cardiovascular;  Laterality: N/A;   PACEMAKER IMPLANT N/A 08/25/2020   Procedure: PACEMAKER IMPLANT;  Surgeon: Cindie Ole DASEN, MD;  [facility] INVAS[facility] CV LAB;  Service: Cardiovascular;  Laterality: N/A;   PACEMAKER INSERTION     MDT   PPM GENERATOR CHANGEOUT N/A 04/11/2020   Procedure: PPM GENERATOR CHANGEOUT;  Surgeon: Fernande Elspeth BROCKS, MD;  Location: [facility] INVASIVE CV LAB;  Service: Cardiovascular;  Laterality: N/A;    Social History: Social History   Socioeconomic History   Marital status: Married    Spouse name: Not on file   Number of children: Not on file   Years of education: Not on file   Highest  education level: Not on file  Occupational History   Not on file  Tobacco Use   Smoking status: Former    Current packs/day: 0.00    Average packs/day: 1 pack/day for 13.7 years (13.7 ttl pk-yrs)    Types: Cigarettes    Start date: 09/15/1951    Quit date: 06/14/1965    Years since quitting: 58.5   Smokeless tobacco: Former    Types: Chew    Quit date: 06/14/1978   Tobacco comments:    chewed tobacco 5 years 1PPD  Vaping Use   Vaping status: Never Used  Substance and Sexual Activity   Alcohol  use: No    Alcohol /week: 0.0 standard drinks of alcohol    Drug use: No   Sexual activity: Not on file  Other Topics Concern   Not on file  Social History Narrative   Not on file   Social Drivers of Health   Financial Resource Strain: Not on file  Food Insecurity: No Food Insecurity (12/28/2022)   Hunger Vital Sign    Worried About Running Out of Food in the Last Year: Never true    Ran Out of Food in the Last Year: Never true  Transportation Needs: No Transportation Needs (12/28/2022)   PRAPARE - Administrator, Civil Service (Medical): No    Lack of Transportation (Non-Medical): No  Physical Activity: Not on file  Stress: Not on file  Social Connections: Not on file  Intimate Partner Violence: Not At Risk (12/28/2022)   Humiliation, Afraid, Rape, and Kick questionnaire    Fear of Current or Ex-Partner: No    Emotionally Abused: No    Physically Abused: No    Sexually Abused: No    Family History: Family History  Problem Relation Age of Onset   Heart disease Father    Heart disease Sister     Current Medications:  Current Outpatient Medications:    aspirin  EC 81 MG EC tablet, Take 1 tablet (81 mg total) by mouth daily., Disp: 30 tablet, Rfl: 11   Calcium Carbonate-Vitamin D (CALCIUM-VITAMIN D) 500-200 MG-UNIT per tablet, Take 1 tablet by mouth daily., Disp: , Rfl:    clonazePAM  (KLONOPIN ) 1 MG tablet, Take 2 tablets (2 mg total) by mouth at bedtime., Disp: , Rfl:     clopidogrel  (PLAVIX ) 75 MG tablet, Take 1 tablet (75 mg total) by mouth daily., Disp: 90 tablet, Rfl: 1   guaiFENesin (MUCINEX) 600 MG 12 hr tablet, Take 600 mg by mouth 2 (two) times daily as needed for cough or to loosen phlegm., Disp: , Rfl:  isosorbide  mononitrate (IMDUR ) 30 MG 24 hr tablet, TAKE 1/2 TABLET BY MOUTH DAILY, Disp: 45 tablet, Rfl: 2   levothyroxine (SYNTHROID, LEVOTHROID) 50 MCG tablet, Take 50 mcg by mouth daily before breakfast. , Disp: , Rfl:    megestrol  (MEGACE ) 40 MG tablet, TAKE 1 TABLET BY MOUTH TWICE DAILY, Disp: 60 tablet, Rfl: 10   midodrine  (PROAMATINE ) 2.5 MG tablet, Take 1 tablet (2.5 mg total) by mouth 2 (two) times daily with a meal., Disp: 60 tablet, Rfl: 6   nitroGLYCERIN  (NITROSTAT ) 0.4 MG SL tablet, Place 1 tablet (0.4 mg total) under the tongue every 5 (five) minutes x 3 doses as needed for chest pain (if no relief after 3rd dose, proceed to ED or call 911)., Disp: 25 tablet, Rfl: 3   pantoprazole  (PROTONIX ) 40 MG tablet, Take 40 mg by mouth 2 (two) times daily before a meal., Disp: , Rfl:    simvastatin  (ZOCOR ) 40 MG tablet, Take 40 mg by mouth at bedtime., Disp: , Rfl:    tadalafil  (CIALIS ) 5 MG tablet, Take 1 tablet (5 mg total) by mouth daily as needed for erectile dysfunction., Disp: 30 tablet, Rfl: 5   venlafaxine  (EFFEXOR ) 37.5 MG tablet, Take 150 mg by mouth daily., Disp: , Rfl:    vitamin B-12 (CYANOCOBALAMIN ) 1000 MCG tablet, Take 1,000 mcg by mouth daily., Disp: , Rfl:    Allergies: Allergies  Allergen Reactions   Penicillins Other (See Comments)    Passed out   Phenazopyridine Hcl Hives   Ramipril Hives    REVIEW OF SYSTEMS:   Review of Systems  Constitutional:  Negative for chills, fatigue and fever.  HENT:   Negative for lump/mass, mouth sores, nosebleeds, sore throat and trouble swallowing.   Eyes:  Negative for eye problems.  Respiratory:  Positive for shortness of breath. Negative for cough.   Cardiovascular:  Negative for  chest pain, leg swelling and palpitations.  Gastrointestinal:  Positive for diarrhea (occasional). Negative for abdominal pain, constipation, nausea and vomiting.  Genitourinary:  Negative for bladder incontinence, difficulty urinating, dysuria, frequency, hematuria and nocturia.   Musculoskeletal:  Negative for arthralgias, back pain, flank pain, myalgias and neck pain.       +left hip and leg pain, 7/10 severity  Skin:  Negative for itching and rash.  Neurological:  Positive for dizziness, headaches and numbness (and tingling in feet).  Hematological:  Does not bruise/bleed easily.  Psychiatric/Behavioral:  Positive for sleep disturbance. Negative for depression and suicidal ideas. The patient is not nervous/anxious.   All other systems reviewed and are negative.    VITALS:   Blood pressure 105/66, pulse 93, temperature 98.3 F (36.8 C), temperature source Oral, resp. rate 19, weight 141 lb 15.6 oz (64.4 kg), SpO2 99%.  Wt Readings from Last 3 Encounters:  12/22/23 141 lb 15.6 oz (64.4 kg)  10/14/23 140 lb 12.8 oz (63.9 kg)  10/06/23 139 lb 15.9 oz (63.5 kg)    Body mass index is 20.97 kg/m.   PHYSICAL EXAM:   Physical Exam Vitals and nursing note reviewed. Exam conducted with a chaperone present.  Constitutional:      Appearance: Normal appearance.  Cardiovascular:     Rate and Rhythm: Normal rate and regular rhythm.     Pulses: Normal pulses.     Heart sounds: Normal heart sounds.  Pulmonary:     Effort: Pulmonary effort is normal.     Breath sounds: Normal breath sounds.  Abdominal:     Palpations: Abdomen is soft.  There is no hepatomegaly, splenomegaly or mass.     Tenderness: There is no abdominal tenderness.  Musculoskeletal:     Right lower leg: No edema.     Left lower leg: No edema.  Lymphadenopathy:     Cervical: No cervical adenopathy.     Right cervical: No superficial, deep or posterior cervical adenopathy.    Left cervical: No superficial, deep or  posterior cervical adenopathy.     Upper Body:     Right upper body: No supraclavicular or axillary adenopathy.     Left upper body: No supraclavicular or axillary adenopathy.  Neurological:     General: No focal deficit present.     Mental Status: He is alert and oriented to person, place, and time.  Psychiatric:        Mood and Affect: Mood normal.        Behavior: Behavior normal.     LABS:      Latest Ref Rng & Units 12/15/2023    2:53 PM 06/30/2023    2:01 PM 12/28/2022    2:00 PM  CBC  WBC 4.0 - 10.5 K/uL 6.9  7.1  6.9   Hemoglobin 13.0 - 17.0 g/dL 88.2  88.4  87.6   Hematocrit 39.0 - 52.0 % 35.3  34.0  37.1   Platelets 150 - 400 K/uL 203  217  210       Latest Ref Rng & Units 12/15/2023    2:53 PM 06/30/2023    2:01 PM 12/28/2022    2:00 PM  CMP  Glucose 70 - 99 mg/dL 78  87  92   BUN 8 - 23 mg/dL 11  15  15    Creatinine 0.61 - 1.24 mg/dL 8.95  8.84  8.87   Sodium 135 - 145 mmol/L 136  136  134   Potassium 3.5 - 5.1 mmol/L 3.6  4.4  4.3   Chloride 98 - 111 mmol/L 105  105  99   CO2 22 - 32 mmol/L 23  23  27    Calcium 8.9 - 10.3 mg/dL 8.8  9.4  9.4   Total Protein 6.5 - 8.1 g/dL 6.5  6.9  7.2   Total Bilirubin 0.0 - 1.2 mg/dL 0.3  0.5  0.7   Alkaline Phos 38 - 126 U/L 17  17  26    AST 15 - 41 U/L 18  17  18    ALT 0 - 44 U/L 12  12  12       No results found for: CEA1, CEA / No results found for: CEA1, CEA Lab Results  Component Value Date   PSA1 1.0 06/10/2022   No results found for: CAN199 No results found for: CAN125  No results found for: TOTALPROTELP, ALBUMINELP, A1GS, A2GS, BETS, BETA2SER, GAMS, MSPIKE, SPEI No results found for: TIBC, FERRITIN, IRONPCTSAT No results found for: LDH   STUDIES:   NM PET (PSMA) SKULL TO MID THIGH Result Date: 12/16/2023 CLINICAL DATA:  Prostate carcinoma with biochemical recurrence. EXAM: NUCLEAR MEDICINE PET SKULL BASE TO THIGH TECHNIQUE: 8.7 mCi Flotufolastat (Posluma ) was injected  intravenously. Full-ring PET imaging was performed from the skull base to thigh after the radiotracer. CT data was obtained and used for attenuation correction and anatomic localization. COMPARISON:  CT chest 08/06/2020 FINDINGS: NECK No radiotracer activity in neck lymph nodes. Incidental CT finding: None. CHEST No radiotracer avid mediastinal lymph nodes. There is pleuroparenchymal nodular thickening in the posterior aspect of the LEFT upper lobe. This nodular thickening  has mild radiotracer activity with SUV max equal 2.8 on image 61. Nodular activity is somewhat linear measuring up to 7 mm (image number 60). This nodular thickening is similar to CT from 12/14/2020. Incidental CT finding: None. ABDOMEN/PELVIS Prostate: Post prostatectomy. There is a focus of radiotracer activity in the LEFT aspect of the prostatectomy bed with SUV max equal 5.9 on image 158. There is soft tissue nodularity associated with the activity measuring 16 mm by 12 mm on image 159. This activity and thickening is immediately dorsal to the ureter and favored unrelated to physiologic urine activity. Distal RIGHT ureteral activity is noted on image 154. Lymph nodes: No abnormal radiotracer accumulation within pelvic or abdominal nodes. Liver: No evidence of liver metastasis. Incidental CT finding: LEFT colon diverticulosis. Atherosclerotic calcification of the aorta. SKELETON No focal activity to suggest skeletal metastasis. IMPRESSION: 1. Focal radiotracer activity associated with soft tissue fullness in the LEFT aspect of the prostatectomy bed is concerning for local prostate carcinoma recurrence. 2. No evidence of metastatic adenopathy in the pelvis or periaortic retroperitoneum. 3. No evidence of visceral metastasis or skeletal metastasis. 4. Pleuroparenchymal nodular thickening in the posterior aspect of the LEFT upper lobe is favored benign. No significant change from 2022. 5.  Aortic Atherosclerosis (ICD10-I70.0). Electronically  Signed   By: Jackquline Boxer M.D.   On: 12/16/2023 15:24

## 2023-12-22 NOTE — Patient Instructions (Signed)
 [facility] at [facility] Discharge Instructions   You were seen and examined today by Dr. Rogers.  He reviewed the results of your lab work which are normal/stable.   He reviewed the results of your PET scan. There was not lytic lesion noted on your spine or on any other bones on this exam. It did show some activity in the area where the prostate was. Dr. MARLA thinks this activity may be coming from the dye in the bladder.   We will proceed with your treatment today.   Return as scheduled.    Thank you for choosing  Cancer Center at [facility] to provide your oncology and hematology care.  To afford each patient quality time with our provider, please arrive at least 15 minutes before your scheduled appointment time.   If you have a lab appointment with the Cancer Center please come in thru the Main Entrance and check in at the main information desk.  You need to re-schedule your appointment should you arrive 10 or more minutes late.  We strive to give you quality time with our providers, and arriving late affects you and other patients whose appointments are after yours.  Also, if you no show three or more times for appointments you may be dismissed from the clinic at the providers discretion.     Again, thank you for choosing [facility].  Our hope is that these requests will decrease the amount of time that you wait before being seen by our physicians.       _____________________________________________________________  Should you have questions after your visit to [facility], please contact our office at [phone] and follow the prompts.  Our office hours are 8:00 a.m. and 4:30 p.m. Monday - Friday.  Please note that voicemails left after 4:00 p.m. may not be returned until the following business day.  We are closed weekends and major holidays.  You do have access to a nurse 24-7, just call the main number to the  clinic [phone] and do not press any options, hold on the line and a nurse will answer the phone.    For prescription refill requests, have your pharmacy contact our office and allow 72 hours.    Due to Covid, you will need to wear a mask upon entering the hospital. If you do not have a mask, a mask will be given to you at the Main Entrance upon arrival. For doctor visits, patients may have 1 support person age 17 or older with them. For treatment visits, patients can not have anyone with them due to social distancing guidelines and our immunocompromised population.

## 2024-01-05 ENCOUNTER — Inpatient Hospital Stay: Payer: Medicaid Other

## 2024-01-09 ENCOUNTER — Ambulatory Visit (INDEPENDENT_AMBULATORY_CARE_PROVIDER_SITE_OTHER): Payer: Medicare Other

## 2024-01-09 DIAGNOSIS — I495 Sick sinus syndrome: Secondary | ICD-10-CM

## 2024-01-10 LAB — CUP PACEART REMOTE DEVICE CHECK
Battery Remaining Longevity: 126 mo
Battery Voltage: 3.01 V
Brady Statistic AP VP Percent: 0.02 %
Brady Statistic AP VS Percent: 43.77 %
Brady Statistic AS VP Percent: 0.01 %
Brady Statistic AS VS Percent: 56.21 %
Brady Statistic RA Percent Paced: 43.73 %
Brady Statistic RV Percent Paced: 0.02 %
Date Time Interrogation Session: 20250728084623
Implantable Lead Connection Status: 753985
Implantable Lead Connection Status: 753985
Implantable Lead Implant Date: 20220314
Implantable Lead Implant Date: 20220314
Implantable Lead Location: 753859
Implantable Lead Location: 753860
Implantable Lead Model: 5076
Implantable Lead Model: 5076
Implantable Pulse Generator Implant Date: 20211029
Lead Channel Impedance Value: 304 Ohm
Lead Channel Impedance Value: 342 Ohm
Lead Channel Impedance Value: 361 Ohm
Lead Channel Impedance Value: 418 Ohm
Lead Channel Pacing Threshold Amplitude: 0.625 V
Lead Channel Pacing Threshold Amplitude: 0.875 V
Lead Channel Pacing Threshold Pulse Width: 0.4 ms
Lead Channel Pacing Threshold Pulse Width: 0.4 ms
Lead Channel Sensing Intrinsic Amplitude: 11.625 mV
Lead Channel Sensing Intrinsic Amplitude: 11.625 mV
Lead Channel Sensing Intrinsic Amplitude: 2.25 mV
Lead Channel Sensing Intrinsic Amplitude: 2.25 mV
Lead Channel Setting Pacing Amplitude: 1.5 V
Lead Channel Setting Pacing Amplitude: 2 V
Lead Channel Setting Pacing Pulse Width: 0.4 ms
Lead Channel Setting Sensing Sensitivity: 1.2 mV
Zone Setting Status: 755011

## 2024-01-12 ENCOUNTER — Inpatient Hospital Stay: Payer: Medicaid Other | Admitting: Hematology

## 2024-01-12 ENCOUNTER — Inpatient Hospital Stay: Payer: Medicaid Other

## 2024-01-12 VITALS — BP 128/71 | HR 85 | Temp 97.7°F | Resp 18 | Wt 143.3 lb

## 2024-01-12 DIAGNOSIS — C61 Malignant neoplasm of prostate: Secondary | ICD-10-CM

## 2024-01-12 DIAGNOSIS — Z5111 Encounter for antineoplastic chemotherapy: Secondary | ICD-10-CM | POA: Diagnosis not present

## 2024-01-12 MED ORDER — LEUPROLIDE ACETATE (3 MONTH) 22.5 MG ~~LOC~~ KIT
22.5000 mg | PACK | Freq: Once | SUBCUTANEOUS | Status: AC
  Administered 2024-01-12: 22.5 mg via SUBCUTANEOUS
  Filled 2024-01-12: qty 22.5

## 2024-01-12 NOTE — Progress Notes (Signed)
 Patient presents today for Eligard  injection. Patient tolerated injection in rt lower abd well with no complaints voiced.  Site clean and dry with no bruising or swelling noted.  No complaints of pain.  Discharged with vital signs stable and no signs or symptoms of distress noted.

## 2024-01-12 NOTE — Patient Instructions (Signed)
 [facility] [place] - A DEPT OF [facility]  Discharge Instructions: Thank you for choosing [facility] to provide your oncology and hematology care.  If you have a lab appointment with the Cancer Center - please note that after April 8th, 2024, all labs will be drawn in the cancer center.  You do not have to check in or register with the main entrance as you have in the past but will complete your check-in in the cancer center.  Wear comfortable clothing and clothing appropriate for easy access to any Portacath or PICC line.   We strive to give you quality time with your provider. You may need to reschedule your appointment if you arrive late (15 or more minutes).  Arriving late affects you and other patients whose appointments are after yours.  Also, if you miss three or more appointments without notifying the office, you may be dismissed from the clinic at the provider's discretion.      For prescription refill requests, have your pharmacy contact our office and allow 72 hours for refills to be completed.    Today you received the following chemotherapy and/or immunotherapy agents Eligard ,  Leuprolide  Suspension for Injection (Prostate Cancer) What is this medication? LEUPROLIDE  (loo PROE lide) reduces the symptoms of prostate cancer. It works by decreasing levels of the hormone testosterone  in the body. This prevents prostate cancer cells from spreading or growing. This medicine may be used for other purposes; ask your health care provider or pharmacist if you have questions. COMMON BRAND NAME(S): Eligard , Lupron  Depot, Lutrate Depot  What should I tell my care team before I take this medication? They need to know if you have any of these conditions: Diabetes Heart disease Heart failure High or low levels of electrolytes, such as magnesium, potassium, or sodium in your blood Irregular heartbeat or rhythm Seizures An unusual or allergic reaction to  leuprolide , other medications, foods, dyes, or preservatives Pregnant or trying to get pregnant Breast-feeding How should I use this medication? This medication is injected under the skin or into a muscle. It is given by your care team in a hospital or clinic setting. Talk to your care team about the use of this medication in children. Special care may be needed. Overdosage: If you think you have taken too much of this medicine contact a poison control center or emergency room at once. NOTE: This medicine is only for you. Do not share this medicine with others. What if I miss a dose? Keep appointments for follow-up doses. It is important not to miss your dose. Call your care team if you are unable to keep an appointment. What may interact with this medication? Do not take this medication with any of the following: Cisapride Dronedarone Ketoconazole Levoketoconazole Pimozide Thioridazine This medication may also interact with the following: Other medications that cause heart rhythm changes This list may not describe all possible interactions. Give your health care provider a list of all the medicines, herbs, non-prescription drugs, or dietary supplements you use. Also tell them if you smoke, drink alcohol , or use illegal drugs. Some items may interact with your medicine. What should I watch for while using this medication? Visit your care team for regular checks on your progress. Tell your care team if your symptoms do not start to get better or if they get worse. This medication may increase blood sugar. The risk may be higher in patients who already have diabetes. Ask your care team  what you can do to lower the risk of diabetes while taking this medication. This medication may cause infertility. Talk to your care team if you are concerned about your fertility. Heart attacks and strokes have been reported with the use of this medication. Get emergency help if you develop signs or symptoms of  a heart attack or stroke. Talk to your care team about the risks and benefits of this medication. What side effects may I notice from receiving this medication? Side effects that you should report to your care team as soon as possible: Allergic reactions--skin rash, itching, hives, swelling of the face, lips, tongue, or throat Heart attack--pain or tightness in the chest, shoulders, arms, or jaw, nausea, shortness of breath, cold or clammy skin, feeling faint or lightheaded Heart rhythm changes--fast or irregular heartbeat, dizziness, feeling faint or lightheaded, chest pain, trouble breathing High blood sugar (hyperglycemia)--increased thirst or amount of urine, unusual weakness or fatigue, blurry vision New or worsening seizures Redness, blistering, peeling, or loosening of the skin, including inside the mouth Stroke--sudden numbness or weakness of the face, arm, or leg, trouble speaking, confusion, trouble walking, loss of balance or coordination, dizziness, severe headache, change in vision Swelling and pain of the tumor site or lymph nodes Side effects that usually do not require medical attention (report these to your care team if they continue or are bothersome): Change in sex drive or performance Hot flashes Joint pain Pain, redness, or irritation at injection site Swelling of the ankles, hands, or feet Unusual weakness or fatigue This list may not describe all possible side effects. Call your doctor for medical advice about side effects. You may report side effects to FDA at 1-800-FDA-1088. Where should I keep my medication? This medication is given in a hospital or clinic. It will not be stored at home. NOTE: This sheet is a summary. It may not cover all possible information. If you have questions about this medicine, talk to your doctor, pharmacist, or health care provider.  2024 Elsevier/Gold Standard (2023-05-13 00:00:00)   To help prevent nausea and vomiting after your  treatment, we encourage you to take your nausea medication as directed.  BELOW ARE SYMPTOMS THAT SHOULD BE REPORTED IMMEDIATELY: *FEVER GREATER THAN 100.4 F (38 C) OR HIGHER *CHILLS OR SWEATING *NAUSEA AND VOMITING THAT IS NOT CONTROLLED WITH YOUR NAUSEA MEDICATION *UNUSUAL SHORTNESS OF BREATH *UNUSUAL BRUISING OR BLEEDING *URINARY PROBLEMS (pain or burning when urinating, or frequent urination) *BOWEL PROBLEMS (unusual diarrhea, constipation, pain near the anus) TENDERNESS IN MOUTH AND THROAT WITH OR WITHOUT PRESENCE OF ULCERS (sore throat, sores in mouth, or a toothache) UNUSUAL RASH, SWELLING OR PAIN  UNUSUAL VAGINAL DISCHARGE OR ITCHING   Items with * indicate a potential emergency and should be followed up as soon as possible or go to the Emergency Department if any problems should occur.  Please show the CHEMOTHERAPY ALERT CARD or IMMUNOTHERAPY ALERT CARD at check-in to the Emergency Department and triage nurse.  Should you have questions after your visit or need to cancel or reschedule your appointment, please contact [facility] [place] - A DEPT OF [facility] [phone]  and follow the prompts.  Office hours are 8:00 a.m. to 4:30 p.m. Monday - Friday. Please note that voicemails left after 4:00 p.m. may not be returned until the following business day.  We are closed weekends and major holidays. You have access to a nurse at all times for urgent questions. Please call the main number  to the clinic [phone] and follow the prompts.  For any non-urgent questions, you may also contact your provider using MyChart. We now offer e-Visits for anyone 20 and older to request care online for non-urgent symptoms. For details visit [email or web address].   Also download the MyChart app! Go to the app store, search MyChart, open the app, select [place], and log in with your MyChart username and password.

## 2024-01-23 ENCOUNTER — Ambulatory Visit: Payer: Self-pay | Admitting: Cardiovascular Disease

## 2024-02-27 ENCOUNTER — Ambulatory Visit: Admitting: Cardiology

## 2024-03-03 ENCOUNTER — Other Ambulatory Visit: Payer: Self-pay | Admitting: Cardiology

## 2024-03-15 NOTE — Progress Notes (Signed)
 Remote PPM Transmission

## 2024-04-09 ENCOUNTER — Ambulatory Visit: Payer: Medicare Other

## 2024-04-09 DIAGNOSIS — I495 Sick sinus syndrome: Secondary | ICD-10-CM

## 2024-04-11 LAB — CUP PACEART REMOTE DEVICE CHECK
Battery Remaining Longevity: 123 mo
Battery Voltage: 3 V
Brady Statistic AP VP Percent: 0.03 %
Brady Statistic AP VS Percent: 46.67 %
Brady Statistic AS VP Percent: 0 %
Brady Statistic AS VS Percent: 53.3 %
Brady Statistic RA Percent Paced: 46.63 %
Brady Statistic RV Percent Paced: 0.03 %
Date Time Interrogation Session: 20251027045325
Implantable Lead Connection Status: 753985
Implantable Lead Connection Status: 753985
Implantable Lead Implant Date: 20220314
Implantable Lead Implant Date: 20220314
Implantable Lead Location: 753859
Implantable Lead Location: 753860
Implantable Lead Model: 5076
Implantable Lead Model: 5076
Implantable Pulse Generator Implant Date: 20211029
Lead Channel Impedance Value: 342 Ohm
Lead Channel Impedance Value: 361 Ohm
Lead Channel Impedance Value: 399 Ohm
Lead Channel Impedance Value: 418 Ohm
Lead Channel Pacing Threshold Amplitude: 0.625 V
Lead Channel Pacing Threshold Amplitude: 0.875 V
Lead Channel Pacing Threshold Pulse Width: 0.4 ms
Lead Channel Pacing Threshold Pulse Width: 0.4 ms
Lead Channel Sensing Intrinsic Amplitude: 2.75 mV
Lead Channel Sensing Intrinsic Amplitude: 2.75 mV
Lead Channel Sensing Intrinsic Amplitude: 8.25 mV
Lead Channel Sensing Intrinsic Amplitude: 8.25 mV
Lead Channel Setting Pacing Amplitude: 1.5 V
Lead Channel Setting Pacing Amplitude: 2 V
Lead Channel Setting Pacing Pulse Width: 0.4 ms
Lead Channel Setting Sensing Sensitivity: 1.2 mV
Zone Setting Status: 755011

## 2024-04-12 NOTE — Progress Notes (Signed)
 Remote PPM Transmission

## 2024-04-13 ENCOUNTER — Inpatient Hospital Stay: Attending: Hematology

## 2024-04-13 VITALS — BP 109/90 | HR 88 | Temp 97.3°F | Resp 18

## 2024-04-13 DIAGNOSIS — C61 Malignant neoplasm of prostate: Secondary | ICD-10-CM | POA: Diagnosis present

## 2024-04-13 DIAGNOSIS — Z5111 Encounter for antineoplastic chemotherapy: Secondary | ICD-10-CM | POA: Diagnosis present

## 2024-04-13 DIAGNOSIS — C7951 Secondary malignant neoplasm of bone: Secondary | ICD-10-CM | POA: Diagnosis present

## 2024-04-13 MED ORDER — LEUPROLIDE ACETATE (3 MONTH) 22.5 MG ~~LOC~~ KIT
22.5000 mg | PACK | Freq: Once | SUBCUTANEOUS | Status: AC
  Administered 2024-04-13: 22.5 mg via SUBCUTANEOUS
  Filled 2024-04-13: qty 22.5

## 2024-04-13 NOTE — Patient Instructions (Signed)
 [facility] [place] - A DEPT OF [place]  Discharge Instructions: Thank you for choosing [facility] to provide your oncology and hematology care.  If you have a lab appointment with the Cancer Center - please note that after April 8th, 2024, all labs will be drawn in the cancer center.  You do not have to check in or register with the main entrance as you have in the past but will complete your check-in in the cancer center.  Wear comfortable clothing and clothing appropriate for easy access to any Portacath or PICC line.   We strive to give you quality time with your provider. You may need to reschedule your appointment if you arrive late (15 or more minutes).  Arriving late affects you and other patients whose appointments are after yours.  Also, if you miss three or more appointments without notifying the office, you may be dismissed from the clinic at the provider's discretion.      For prescription refill requests, have your pharmacy contact our office and allow 72 hours for refills to be completed.    Today you received the following chemotherapy and/or immunotherapy agents Eligard . Leuprolide  Suspension for Injection (Prostate Cancer) What is this medication? LEUPROLIDE  (loo PROE lide) reduces the symptoms of prostate cancer. It works by decreasing levels of the hormone testosterone  in the body. This prevents prostate cancer cells from spreading or growing. This medicine may be used for other purposes; ask your health care provider or pharmacist if you have questions. COMMON BRAND NAME(S): Eligard , Lupron  Depot, Lutrate Depot  What should I tell my care team before I take this medication? They need to know if you have any of these conditions: Diabetes Heart disease Heart failure High or low levels of electrolytes, such as magnesium, potassium, or sodium in your blood Irregular heartbeat or rhythm Seizures An unusual or allergic reaction to  leuprolide , other medications, foods, dyes, or preservatives Pregnant or trying to get pregnant Breast-feeding How should I use this medication? This medication is injected under the skin or into a muscle. It is given by your care team in a hospital or clinic setting. Talk to your care team about the use of this medication in children. Special care may be needed. Overdosage: If you think you have taken too much of this medicine contact a poison control center or emergency room at once. NOTE: This medicine is only for you. Do not share this medicine with others. What if I miss a dose? Keep appointments for follow-up doses. It is important not to miss your dose. Call your care team if you are unable to keep an appointment. What may interact with this medication? Do not take this medication with any of the following: Cisapride Dronedarone Ketoconazole Levoketoconazole Pimozide Thioridazine This medication may also interact with the following: Other medications that cause heart rhythm changes This list may not describe all possible interactions. Give your health care provider a list of all the medicines, herbs, non-prescription drugs, or dietary supplements you use. Also tell them if you smoke, drink alcohol , or use illegal drugs. Some items may interact with your medicine. What should I watch for while using this medication? Your condition will be monitored carefully while you are receiving this medication. Tell your care team if your symptoms do not start to get better or if they get worse. Heart attacks and strokes have been reported with the use of this medication. Get emergency help if you develop signs  or symptoms of a heart attack or stroke. Talk to your care team about the risks and benefits of this medication. This medication may cause serious skin reactions. They can happen weeks to months after starting the medication. Talk to your care team right away if you have fevers or flu-like  symptoms with a rash. The rash may be red or purple and then turn into blisters or peeling of the skin. Or you might notice a red rash with swelling of the face, lips, or lymph nodes in your neck or under your arms. This medication may increase blood sugar. The risk may be higher in patients who already have diabetes. Ask your care team what you can do to lower your risk of diabetes while taking this medication. This medication may cause infertility. Talk to your care team if you are concerned about your fertility. What side effects may I notice from receiving this medication? Side effects that you should report to your care team as soon as possible: Allergic reactions--skin rash, itching, hives, swelling of the face, lips, tongue, or throat Heart attack--pain or tightness in the chest, shoulders, arms, or jaw, nausea, shortness of breath, cold or clammy skin, feeling faint or lightheaded Heart rhythm changes--fast or irregular heartbeat, dizziness, feeling faint or lightheaded, chest pain, trouble breathing High blood sugar (hyperglycemia)--increased thirst or amount of urine, unusual weakness or fatigue, blurry vision New or worsening seizures Redness, blistering, peeling, or loosening of the skin, including inside the mouth Stroke--sudden numbness or weakness of the face, arm, or leg, trouble speaking, confusion, trouble walking, loss of balance or coordination, dizziness, severe headache, change in vision Swelling and pain of the tumor site or lymph nodes Side effects that usually do not require medical attention (report these to your care team if they continue or are bothersome): Change in sex drive or performance Hot flashes Joint pain Pain, redness, or irritation at injection site Swelling of the ankles, hands, or feet Unusual weakness or fatigue This list may not describe all possible side effects. Call your doctor for medical advice about side effects. You may report side effects to FDA  at 1-800-FDA-1088. Where should I keep my medication? This medication is given in a hospital or clinic. It will not be stored at home. NOTE: This sheet is a summary. It may not cover all possible information. If you have questions about this medicine, talk to your doctor, pharmacist, or health care provider.  2025 Elsevier/Gold Standard (2023-08-29 00:00:00)      To help prevent nausea and vomiting after your treatment, we encourage you to take your nausea medication as directed.  BELOW ARE SYMPTOMS THAT SHOULD BE REPORTED IMMEDIATELY: *FEVER GREATER THAN 100.4 F (38 C) OR HIGHER *CHILLS OR SWEATING *NAUSEA AND VOMITING THAT IS NOT CONTROLLED WITH YOUR NAUSEA MEDICATION *UNUSUAL SHORTNESS OF BREATH *UNUSUAL BRUISING OR BLEEDING *URINARY PROBLEMS (pain or burning when urinating, or frequent urination) *BOWEL PROBLEMS (unusual diarrhea, constipation, pain near the anus) TENDERNESS IN MOUTH AND THROAT WITH OR WITHOUT PRESENCE OF ULCERS (sore throat, sores in mouth, or a toothache) UNUSUAL RASH, SWELLING OR PAIN  UNUSUAL VAGINAL DISCHARGE OR ITCHING   Items with * indicate a potential emergency and should be followed up as soon as possible or go to the Emergency Department if any problems should occur.  Please show the CHEMOTHERAPY ALERT CARD or IMMUNOTHERAPY ALERT CARD at check-in to the Emergency Department and triage nurse.  Should you have questions after your visit or need to cancel or reschedule  your appointment, please contact [facility] [place] - A DEPT OF [facility] [phone]  and follow the prompts.  Office hours are 8:00 a.m. to 4:30 p.m. Monday - Friday. Please note that voicemails left after 4:00 p.m. may not be returned until the following business day.  We are closed weekends and major holidays. You have access to a nurse at all times for urgent questions. Please call the main number to the clinic [phone] and follow the prompts.  For any  non-urgent questions, you may also contact your provider using MyChart. We now offer e-Visits for anyone 81 and older to request care online for non-urgent symptoms. For details visit [email or web address].   Also download the MyChart app! Go to the app store, search MyChart, open the app, select [place], and log in with your MyChart username and password.

## 2024-04-13 NOTE — Progress Notes (Signed)
 Russell Browning. presents today for injection per the provider's orders.  Eligard  administration without incident; injection site WNL; see MAR for injection details.  Patient tolerated procedure well and without incident.  No questions or concerns noted. Discharged from clinic ambulatory in stable condition. Alert and oriented x 3. F/U with [facility] as scheduled.

## 2024-04-19 ENCOUNTER — Ambulatory Visit: Payer: Self-pay | Admitting: Cardiovascular Disease

## 2024-05-01 ENCOUNTER — Other Ambulatory Visit: Payer: Self-pay | Admitting: Cardiology

## 2024-05-02 ENCOUNTER — Encounter: Payer: Self-pay | Admitting: Cardiology

## 2024-05-02 ENCOUNTER — Ambulatory Visit: Attending: Cardiology | Admitting: Cardiology

## 2024-05-02 VITALS — BP 126/78 | HR 89 | Ht 69.0 in | Wt 151.8 lb

## 2024-05-02 DIAGNOSIS — I495 Sick sinus syndrome: Secondary | ICD-10-CM | POA: Insufficient documentation

## 2024-05-02 DIAGNOSIS — Z950 Presence of cardiac pacemaker: Secondary | ICD-10-CM | POA: Diagnosis present

## 2024-05-02 DIAGNOSIS — I480 Paroxysmal atrial fibrillation: Secondary | ICD-10-CM | POA: Insufficient documentation

## 2024-05-02 DIAGNOSIS — I25119 Atherosclerotic heart disease of native coronary artery with unspecified angina pectoris: Secondary | ICD-10-CM | POA: Insufficient documentation

## 2024-05-02 DIAGNOSIS — E782 Mixed hyperlipidemia: Secondary | ICD-10-CM | POA: Insufficient documentation

## 2024-05-02 NOTE — Patient Instructions (Signed)
 Medication Instructions:   Your physician recommends that you continue on your current medications as directed. Please refer to the Current Medication list given to you today.   Labwork: None today  Testing/Procedures: None today  Follow-Up: 6 months Dr.McDowell  Any Other Special Instructions Will Be Listed Below (If Applicable).  If you need a refill on your cardiac medications before your next appointment, please call your pharmacy.

## 2024-05-02 NOTE — Progress Notes (Signed)
    Cardiology Office Note  Date: 05/02/2024   ID: Russell DELENA Sharps Sr., DOB December 29, 1940, MRN [id number]  History of Present Illness: Russell Ishler. is an 83 y.o. male last seen in January.  He is here for a routine visit.  Reports no angina on current medical therapy, stable NYHA class II dyspnea.  He has been more steady on his feet, we did try low-dose midodrine  to see if that would help with orthostatic symptoms and he did not notice any difference.  His blood pressure was normal today.  Medtronic pacemaker in place with follow-up by Dr. Nancey.  Device interrogation in October revealed normal function.  He has not had any definitive increase in AF burden.  We went over his medications which are otherwise stable from a cardiac perspective.  Lab work is outlined below.  Physical Exam: VS:  BP 126/78 (BP Location: Left Arm)   Pulse 89   Ht 5' 9 (1.753 m)   Wt 151 lb 12.8 oz (68.9 kg)   SpO2 98%   BMI 22.42 kg/m , BMI Body mass index is 22.42 kg/m.  Wt Readings from Last 3 Encounters:  05/02/24 151 lb 12.8 oz (68.9 kg)  01/12/24 143 lb 4.8 oz (65 kg)  12/22/23 141 lb 15.6 oz (64.4 kg)    General: Patient appears comfortable at rest. HEENT: Conjunctiva and lids normal. Neck: Supple, no elevated JVP or carotid bruits. Lungs: Clear to auscultation, nonlabored breathing at rest. Cardiac: Regular rate and rhythm, no S3 or significant systolic murmur. Extremities: No pitting edema.  ECG:  An ECG dated 10/14/2023 was personally reviewed today and demonstrated:  Sinus rhythm with possible old intra infarct pattern.  Labwork: 12/15/2023: ALT 12; AST 18; BUN 11; Creatinine, Ser 1.04; Hemoglobin 11.7; Platelets 203; Potassium 3.6; Sodium 136   Other Studies Reviewed Today:  No interval cardiac testing for review today.  Assessment and Plan:  1.  CAD status post DES to the proximal/mid/distal RCA in 2006, angioplasty of the mid to distal RCA in 2016.  LVEF 45 to 50% by echocardiogram  in February 2022.  Symptomatically stable without angina on medical therapy.  Continue aspirin  81 mg daily, Plavix  75 mg daily, Imdur  15 mg daily, and Zocor  40 mg daily.  2.  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 Nancey.  Device interrogation normal in October.   4.  History of brief paroxysmal atrial fibrillation with low rhythm burden based on device interrogations.  Anticoagulation has not been pursued as yet.  CHA2DS2-VASc score is 5.  No obvious increase in rhythm frequency.   5.  Mixed hyperlipidemia.  LDL 69 and HDL 50 in December 2023.  He follows at [facility].  Continue Zocor  40 mg daily.  Disposition:  Follow up 6 months.  Signed, Jayson JUDITHANN Sierras, M.D., F.A.C.C. [facility]

## 2024-05-18 ENCOUNTER — Encounter: Payer: Self-pay | Admitting: Oncology

## 2024-07-03 ENCOUNTER — Other Ambulatory Visit: Payer: Self-pay | Admitting: Cardiology

## 2024-07-09 ENCOUNTER — Ambulatory Visit: Payer: Medicare Other

## 2024-07-09 DIAGNOSIS — I495 Sick sinus syndrome: Secondary | ICD-10-CM

## 2024-07-10 ENCOUNTER — Ambulatory Visit: Payer: Self-pay | Admitting: Cardiovascular Disease

## 2024-07-10 LAB — CUP PACEART REMOTE DEVICE CHECK
Battery Remaining Longevity: 120 mo
Battery Voltage: 3 V
Brady Statistic AP VP Percent: 0.02 %
Brady Statistic AP VS Percent: 41.13 %
Brady Statistic AS VP Percent: 0 %
Brady Statistic AS VS Percent: 58.84 %
Brady Statistic RA Percent Paced: 41.07 %
Brady Statistic RV Percent Paced: 0.03 %
Date Time Interrogation Session: 20260126050116
Implantable Lead Connection Status: 753985
Implantable Lead Connection Status: 753985
Implantable Lead Implant Date: 20220314
Implantable Lead Implant Date: 20220314
Implantable Lead Location: 753859
Implantable Lead Location: 753860
Implantable Lead Model: 5076
Implantable Lead Model: 5076
Implantable Pulse Generator Implant Date: 20211029
Lead Channel Impedance Value: 304 Ohm
Lead Channel Impedance Value: 323 Ohm
Lead Channel Impedance Value: 361 Ohm
Lead Channel Impedance Value: 399 Ohm
Lead Channel Pacing Threshold Amplitude: 0.5 V
Lead Channel Pacing Threshold Amplitude: 0.875 V
Lead Channel Pacing Threshold Pulse Width: 0.4 ms
Lead Channel Pacing Threshold Pulse Width: 0.4 ms
Lead Channel Sensing Intrinsic Amplitude: 2.75 mV
Lead Channel Sensing Intrinsic Amplitude: 2.75 mV
Lead Channel Sensing Intrinsic Amplitude: 5.375 mV
Lead Channel Sensing Intrinsic Amplitude: 5.375 mV
Lead Channel Setting Pacing Amplitude: 1.5 V
Lead Channel Setting Pacing Amplitude: 2.25 V
Lead Channel Setting Pacing Pulse Width: 0.4 ms
Lead Channel Setting Sensing Sensitivity: 1.2 mV
Zone Setting Status: 755011

## 2024-07-13 ENCOUNTER — Inpatient Hospital Stay: Admitting: Oncology

## 2024-07-13 ENCOUNTER — Inpatient Hospital Stay

## 2024-07-13 ENCOUNTER — Ambulatory Visit

## 2024-07-13 ENCOUNTER — Inpatient Hospital Stay: Attending: Hematology

## 2024-07-13 ENCOUNTER — Other Ambulatory Visit

## 2024-07-13 ENCOUNTER — Ambulatory Visit: Admitting: Oncology

## 2024-07-17 ENCOUNTER — Ambulatory Visit: Admitting: Oncology

## 2024-07-17 ENCOUNTER — Other Ambulatory Visit

## 2024-07-17 NOTE — Progress Notes (Signed)
 Remote PPM Transmission

## 2024-07-24 ENCOUNTER — Ambulatory Visit

## 2024-07-24 ENCOUNTER — Ambulatory Visit: Admitting: Oncology

## 2024-08-02 ENCOUNTER — Inpatient Hospital Stay: Admitting: Oncology

## 2024-08-02 ENCOUNTER — Inpatient Hospital Stay: Attending: Hematology

## 2024-08-02 ENCOUNTER — Inpatient Hospital Stay

## 2024-10-08 ENCOUNTER — Ambulatory Visit

## 2025-01-07 ENCOUNTER — Ambulatory Visit

## 2025-04-08 ENCOUNTER — Ambulatory Visit

## 2025-07-08 ENCOUNTER — Ambulatory Visit

## 2025-10-07 ENCOUNTER — Ambulatory Visit

## 2026-01-06 ENCOUNTER — Ambulatory Visit
